# Patient Record
Sex: Female | Born: 1945
Health system: Southern US, Community
[De-identification: ages and names within clinical notes are randomized; demographics above are authoritative.]

## PROBLEM LIST (undated history)

## (undated) DIAGNOSIS — I071 Rheumatic tricuspid insufficiency: Secondary | ICD-10-CM

## (undated) DIAGNOSIS — J90 Pleural effusion, not elsewhere classified: Secondary | ICD-10-CM

## (undated) DIAGNOSIS — R739 Hyperglycemia, unspecified: Secondary | ICD-10-CM

## (undated) DIAGNOSIS — E039 Hypothyroidism, unspecified: Secondary | ICD-10-CM

## (undated) DIAGNOSIS — I2510 Atherosclerotic heart disease of native coronary artery without angina pectoris: Secondary | ICD-10-CM

## (undated) DIAGNOSIS — I10 Essential (primary) hypertension: Secondary | ICD-10-CM

## (undated) DIAGNOSIS — I428 Other cardiomyopathies: Secondary | ICD-10-CM

## (undated) DIAGNOSIS — I509 Heart failure, unspecified: Secondary | ICD-10-CM

## (undated) DIAGNOSIS — I350 Nonrheumatic aortic (valve) stenosis: Secondary | ICD-10-CM

## (undated) DIAGNOSIS — I340 Nonrheumatic mitral (valve) insufficiency: Secondary | ICD-10-CM

## (undated) DIAGNOSIS — I5022 Chronic systolic (congestive) heart failure: Secondary | ICD-10-CM

## (undated) DIAGNOSIS — N183 Chronic kidney disease, stage 3 unspecified: Secondary | ICD-10-CM

## (undated) DIAGNOSIS — E7800 Pure hypercholesterolemia, unspecified: Secondary | ICD-10-CM

## (undated) DIAGNOSIS — I4821 Permanent atrial fibrillation: Secondary | ICD-10-CM

## (undated) DIAGNOSIS — I499 Cardiac arrhythmia, unspecified: Secondary | ICD-10-CM

## (undated) DIAGNOSIS — M1990 Unspecified osteoarthritis, unspecified site: Secondary | ICD-10-CM

## (undated) DIAGNOSIS — I3139 Other pericardial effusion (noninflammatory): Secondary | ICD-10-CM

## (undated) DIAGNOSIS — I272 Pulmonary hypertension, unspecified: Secondary | ICD-10-CM

## (undated) DIAGNOSIS — E785 Hyperlipidemia, unspecified: Secondary | ICD-10-CM

## (undated) DIAGNOSIS — M8580 Other specified disorders of bone density and structure, unspecified site: Secondary | ICD-10-CM

## (undated) DIAGNOSIS — I4891 Unspecified atrial fibrillation: Secondary | ICD-10-CM

## (undated) DIAGNOSIS — K635 Polyp of colon: Secondary | ICD-10-CM

## (undated) DIAGNOSIS — E049 Nontoxic goiter, unspecified: Secondary | ICD-10-CM

## (undated) HISTORY — PX: US ECHOCARDIOGRAPHY: HXRAD669

## (undated) HISTORY — DX: Unspecified osteoarthritis, unspecified site: M19.90

## (undated) HISTORY — DX: Chronic systolic (congestive) heart failure: I50.22

## (undated) HISTORY — DX: Morbid (severe) obesity due to excess calories: E66.01

## (undated) HISTORY — DX: Hyperlipidemia, unspecified: E78.5

## (undated) HISTORY — DX: Permanent atrial fibrillation: I48.21

## (undated) HISTORY — DX: Hyperglycemia, unspecified: R73.9

## (undated) HISTORY — DX: Other specified disorders of bone density and structure, unspecified site: M85.80

## (undated) HISTORY — DX: Unspecified atrial fibrillation: I48.91

## (undated) HISTORY — PX: CARDIOVASCULAR STRESS TEST: SHX262

## (undated) HISTORY — DX: Nontoxic goiter, unspecified: E04.9

## (undated) HISTORY — DX: Hypothyroidism, unspecified: E03.9

## (undated) HISTORY — PX: COLONOSCOPY: SHX174

## (undated) HISTORY — DX: Polyp of colon: K63.5

## (undated) HISTORY — DX: Essential (primary) hypertension: I10

## (undated) HISTORY — PX: POLYPECTOMY: SHX149

## (undated) HISTORY — DX: Chronic kidney disease, stage 3 unspecified: N18.30

## (undated) HISTORY — DX: Heart failure, unspecified: I50.9

---

## 1975-12-03 HISTORY — PX: TUBAL LIGATION: SHX77

## 1998-09-06 ENCOUNTER — Other Ambulatory Visit: Admission: RE | Admit: 1998-09-06 | Discharge: 1998-09-06 | Payer: Self-pay | Admitting: Gynecology

## 1999-11-01 ENCOUNTER — Other Ambulatory Visit: Admission: RE | Admit: 1999-11-01 | Discharge: 1999-11-01 | Payer: Self-pay | Admitting: Gynecology

## 2000-04-29 ENCOUNTER — Encounter: Admission: RE | Admit: 2000-04-29 | Discharge: 2000-04-29 | Payer: Self-pay | Admitting: *Deleted

## 2000-12-16 ENCOUNTER — Observation Stay (HOSPITAL_COMMUNITY): Admission: EM | Admit: 2000-12-16 | Discharge: 2000-12-17 | Payer: Self-pay | Admitting: Emergency Medicine

## 2002-06-24 ENCOUNTER — Ambulatory Visit (HOSPITAL_COMMUNITY): Admission: RE | Admit: 2002-06-24 | Discharge: 2002-06-24 | Payer: Self-pay | Admitting: Gastroenterology

## 2003-01-20 ENCOUNTER — Encounter: Admission: RE | Admit: 2003-01-20 | Discharge: 2003-01-20 | Payer: Self-pay | Admitting: Internal Medicine

## 2003-01-20 ENCOUNTER — Encounter: Payer: Self-pay | Admitting: Internal Medicine

## 2009-10-05 ENCOUNTER — Encounter: Admission: RE | Admit: 2009-10-05 | Discharge: 2009-10-05 | Payer: Self-pay | Admitting: Orthopedic Surgery

## 2010-12-02 HISTORY — PX: BACK SURGERY: SHX140

## 2010-12-04 ENCOUNTER — Emergency Department (HOSPITAL_COMMUNITY)
Admission: EM | Admit: 2010-12-04 | Discharge: 2010-12-04 | Payer: Self-pay | Source: Home / Self Care | Admitting: Emergency Medicine

## 2011-01-11 ENCOUNTER — Other Ambulatory Visit: Payer: Self-pay | Admitting: Orthopedic Surgery

## 2011-01-11 ENCOUNTER — Other Ambulatory Visit (HOSPITAL_COMMUNITY): Payer: Self-pay | Admitting: Orthopedic Surgery

## 2011-01-11 ENCOUNTER — Ambulatory Visit (HOSPITAL_COMMUNITY)
Admission: RE | Admit: 2011-01-11 | Discharge: 2011-01-11 | Disposition: A | Discharge status: Home / Self Care | Payer: BC Managed Care – PPO | Source: Ambulatory Visit | Attending: Orthopedic Surgery | Admitting: Orthopedic Surgery

## 2011-01-11 ENCOUNTER — Encounter (HOSPITAL_COMMUNITY): Payer: BC Managed Care – PPO

## 2011-01-11 DIAGNOSIS — Z01818 Encounter for other preprocedural examination: Secondary | ICD-10-CM

## 2011-01-11 LAB — CBC
HCT: 44.3 % (ref 36.0–46.0)
Hemoglobin: 14.9 g/dL (ref 12.0–15.0)
MCH: 28.2 pg (ref 26.0–34.0)
MCHC: 33.6 g/dL (ref 30.0–36.0)
MCV: 83.7 fL (ref 78.0–100.0)
Platelets: 265 10*3/uL (ref 150–400)
RBC: 5.29 MIL/uL — ABNORMAL HIGH (ref 3.87–5.11)
RDW: 12.5 % (ref 11.5–15.5)
WBC: 7.2 10*3/uL (ref 4.0–10.5)

## 2011-01-11 LAB — BASIC METABOLIC PANEL
BUN: 18 mg/dL (ref 6–23)
CO2: 31 mEq/L (ref 19–32)
Calcium: 9.3 mg/dL (ref 8.4–10.5)
Chloride: 103 mEq/L (ref 96–112)
Creatinine, Ser: 1.11 mg/dL (ref 0.4–1.2)
GFR calc Af Amer: 60 mL/min — ABNORMAL LOW (ref >60)
GFR calc non Af Amer: 49 mL/min — ABNORMAL LOW (ref >60)
Glucose, Bld: 90 mg/dL (ref 70–99)
Potassium: 3.6 mEq/L (ref 3.5–5.1)
Sodium: 143 mEq/L (ref 135–145)

## 2011-01-11 LAB — SURGICAL PCR SCREEN
MRSA, PCR: NEGATIVE
Staphylococcus aureus: NEGATIVE

## 2011-01-15 ENCOUNTER — Other Ambulatory Visit (HOSPITAL_COMMUNITY): Payer: Self-pay | Admitting: Orthopedic Surgery

## 2011-01-15 ENCOUNTER — Inpatient Hospital Stay (HOSPITAL_COMMUNITY)
Admission: RE | Admit: 2011-01-15 | Discharge: 2011-01-19 | DRG: 756 | Disposition: A | Discharge status: Home Health | Payer: BC Managed Care – PPO | Attending: Orthopedic Surgery | Admitting: Orthopedic Surgery

## 2011-01-15 ENCOUNTER — Ambulatory Visit (HOSPITAL_COMMUNITY)
Admission: RE | Admit: 2011-01-15 | Discharge: 2011-01-15 | Disposition: A | Discharge status: Home / Self Care | Payer: BC Managed Care – PPO | Source: Ambulatory Visit | Attending: Orthopedic Surgery | Admitting: Orthopedic Surgery

## 2011-01-15 DIAGNOSIS — M549 Dorsalgia, unspecified: Secondary | ICD-10-CM

## 2011-01-15 DIAGNOSIS — M519 Unspecified thoracic, thoracolumbar and lumbosacral intervertebral disc disorder: Secondary | ICD-10-CM | POA: Diagnosis present

## 2011-01-15 DIAGNOSIS — E039 Hypothyroidism, unspecified: Secondary | ICD-10-CM | POA: Diagnosis present

## 2011-01-15 DIAGNOSIS — M4310 Spondylolisthesis, site unspecified: Principal | ICD-10-CM | POA: Diagnosis present

## 2011-01-15 DIAGNOSIS — E785 Hyperlipidemia, unspecified: Secondary | ICD-10-CM | POA: Diagnosis present

## 2011-01-15 DIAGNOSIS — I4891 Unspecified atrial fibrillation: Secondary | ICD-10-CM | POA: Diagnosis not present

## 2011-01-15 DIAGNOSIS — I10 Essential (primary) hypertension: Secondary | ICD-10-CM | POA: Diagnosis not present

## 2011-01-15 LAB — TYPE AND SCREEN
ABO/RH(D): A NEG
Antibody Screen: NEGATIVE

## 2011-01-16 LAB — ABO/RH: ABO/RH(D): A NEG

## 2011-01-16 NOTE — Op Note (Signed)
[name], [name]                ACCOUNT NO.:  [id number]  MEDICAL RECORD NO.:  [id number]           PATIENT TYPE:  O  LOCATION:  XRAY                         FACILITY:  [facility]  PHYSICIAN:  Marlowe Kays, M.D.  DATE OF BIRTH:  March 25, 1946  DATE OF PROCEDURE:  01/15/2011 DATE OF DISCHARGE:                              OPERATIVE REPORT   PREOPERATIVE DIAGNOSIS:  Severe spinal stenosis, L4-L5, secondary to degenerative spondylolisthesis with instability.  POSTOPERATIVE DIAGNOSIS:  Severe spinal stenosis, L4-L5, secondary to degenerative spondylolisthesis with instability.  OPERATIONS: 1. Central and lateral decompressive laminectomy, L4-L5. 2. Lateral fusion using autologous bone and Actifuse.  SURGEON:  Marlowe Kays, MD  ASSISTANT:  Georges Lynch. Gioffre, MD  ANESTHESIA:  General.  JUSTIFICATION FOR PROCEDURE:  She had symptoms of spinal stenosis, and on a myelogram performed on October 05, 2009, had severe spinal stenosis at L4-L5, particularly in the AP projection, as well as 6 mm of excursion of L4 on L5 on flexion/extension films.  Accordingly, I felt that the above-mentioned surgery was indicated.  PROCEDURE:  Prophylactic antibiotics, satisfactory general anesthesia, Foley catheter inserted, prone position on rolls.  Back was prepped with DuraPrep.  Time-out performed.  Using 3 spinal needles on lateral x-ray, we tentatively located L4-L5.  I then completed draping the back in sterile field, Ioban employed, midline incision.  Once down to the spinous processes, I tagged what we thought were L4 and L5 with Kocher clamps and a second lateral x-ray confirmed that this was the anatomic location desired.  We then dissected soft tissue off the neural arches from L3 to L5 and also worked laterally to L4-L5 over the transverse processes.  Then, first without and then with the microscope, I removed the spinous process of L4 and bone was saved for use later, and a portion of  the superior part of L5 and the inferior part of L3.  We then brought in the microscope and completed the decompression.  She was extremely tight at L4-L5 and at the L4-L5 junction, her dura was very thinned out.  At the conclusion of the decompression and after irrigating the wound, I then placed a Surgicel on top of the durum to try and give the thinned out dura a little more buttress and then we placed Gelfoam soaked in thrombin on top of this.  Then, working laterally over the transverse processes, we used the autologous bone supplementing with Actifuse at both locations.  I then removed the self- retaining retractors.  The wound looked reasonably dry.  The closure was performed with interrupted #1 Vicryl in the para lumbar muscle and fascia, leaving a small aperture distally for egress of any accumulated fluid.  Deep subcutaneous tissue was closed with #1 Vicryl again, 2-0 Vicryl in the superficial subcutaneous tissue, and staples in the skin.  Betadine Adaptic dry sterile dressings were applied.  She tolerated the procedure well and at the time of this dictation, she was on her way to the recovery room in satisfactory condition with no known complications. Blood loss was roughly 150 cc.  No blood replacement.  ______________________________ Marlowe Kays, M.D.     JA/MEDQ  D:  01/15/2011  T:  01/15/2011  Job:  [id number]  Electronically Signed by Marlowe Kays M.D. on 01/16/2011 11:11:06 AM

## 2011-01-17 DIAGNOSIS — I4891 Unspecified atrial fibrillation: Secondary | ICD-10-CM

## 2011-01-17 LAB — HEMOGLOBIN AND HEMATOCRIT, BLOOD
HCT: 35.1 % — ABNORMAL LOW (ref 36.0–46.0)
Hemoglobin: 11.6 g/dL — ABNORMAL LOW (ref 12.0–15.0)

## 2011-01-18 DIAGNOSIS — I4891 Unspecified atrial fibrillation: Secondary | ICD-10-CM

## 2011-01-18 LAB — TSH: TSH: 0.595 u[IU]/mL (ref 0.350–4.500)

## 2011-01-18 LAB — BASIC METABOLIC PANEL
BUN: 8 mg/dL (ref 6–23)
CO2: 30 mEq/L (ref 19–32)
Calcium: 8.7 mg/dL (ref 8.4–10.5)
Chloride: 100 mEq/L (ref 96–112)
Creatinine, Ser: 0.91 mg/dL (ref 0.4–1.2)
GFR calc Af Amer: >60 mL/min (ref >60)
GFR calc non Af Amer: >60 mL/min (ref >60)
Glucose, Bld: 100 mg/dL — ABNORMAL HIGH (ref 70–99)
Potassium: 3.5 mEq/L (ref 3.5–5.1)
Sodium: 136 mEq/L (ref 135–145)

## 2011-01-19 LAB — BASIC METABOLIC PANEL
BUN: 12 mg/dL (ref 6–23)
CO2: 30 mEq/L (ref 19–32)
Calcium: 8.7 mg/dL (ref 8.4–10.5)
Chloride: 100 mEq/L (ref 96–112)
Creatinine, Ser: 1.06 mg/dL (ref 0.4–1.2)
GFR calc Af Amer: >60 mL/min (ref >60)
GFR calc non Af Amer: 52 mL/min — ABNORMAL LOW (ref >60)
Glucose, Bld: 99 mg/dL (ref 70–99)
Potassium: 4.2 mEq/L (ref 3.5–5.1)
Sodium: 139 mEq/L (ref 135–145)

## 2011-01-30 ENCOUNTER — Inpatient Hospital Stay (HOSPITAL_COMMUNITY)
Admission: AD | Admit: 2011-01-30 | Discharge: 2011-02-06 | DRG: 418 | Disposition: A | Discharge status: Home / Self Care | Payer: BC Managed Care – PPO | Source: Ambulatory Visit | Attending: Orthopedic Surgery | Admitting: Orthopedic Surgery

## 2011-01-30 DIAGNOSIS — L02219 Cutaneous abscess of trunk, unspecified: Secondary | ICD-10-CM | POA: Diagnosis present

## 2011-01-30 DIAGNOSIS — Y838 Other surgical procedures as the cause of abnormal reaction of the patient, or of later complication, without mention of misadventure at the time of the procedure: Secondary | ICD-10-CM | POA: Diagnosis present

## 2011-01-30 DIAGNOSIS — L03319 Cellulitis of trunk, unspecified: Secondary | ICD-10-CM | POA: Diagnosis present

## 2011-01-30 DIAGNOSIS — T8140XA Infection following a procedure, unspecified, initial encounter: Principal | ICD-10-CM | POA: Diagnosis present

## 2011-01-30 LAB — COMPREHENSIVE METABOLIC PANEL
ALT: 16 U/L (ref 0–35)
AST: 21 U/L (ref 0–37)
Albumin: 3.3 g/dL — ABNORMAL LOW (ref 3.5–5.2)
Alkaline Phosphatase: 58 U/L (ref 39–117)
BUN: 15 mg/dL (ref 6–23)
CO2: 27 mEq/L (ref 19–32)
Calcium: 8.7 mg/dL (ref 8.4–10.5)
Chloride: 102 mEq/L (ref 96–112)
Creatinine, Ser: 1.24 mg/dL — ABNORMAL HIGH (ref 0.4–1.2)
GFR calc Af Amer: 53 mL/min — ABNORMAL LOW (ref >60)
GFR calc non Af Amer: 43 mL/min — ABNORMAL LOW (ref >60)
Glucose, Bld: 114 mg/dL — ABNORMAL HIGH (ref 70–99)
Potassium: 3.7 mEq/L (ref 3.5–5.1)
Sodium: 138 mEq/L (ref 135–145)
Total Bilirubin: 1 mg/dL (ref 0.3–1.2)
Total Protein: 6.3 g/dL (ref 6.0–8.3)

## 2011-01-30 LAB — CBC
HCT: 35.4 % — ABNORMAL LOW (ref 36.0–46.0)
Hemoglobin: 11.7 g/dL — ABNORMAL LOW (ref 12.0–15.0)
MCH: 28 pg (ref 26.0–34.0)
MCHC: 33.1 g/dL (ref 30.0–36.0)
MCV: 84.7 fL (ref 78.0–100.0)
Platelets: 226 10*3/uL (ref 150–400)
RBC: 4.18 MIL/uL (ref 3.87–5.11)
RDW: 12.8 % (ref 11.5–15.5)
WBC: 17.6 10*3/uL — ABNORMAL HIGH (ref 4.0–10.5)

## 2011-01-30 LAB — SEDIMENTATION RATE: Sed Rate: 35 mm/hr — ABNORMAL HIGH (ref 0–22)

## 2011-01-30 NOTE — Consult Note (Signed)
[name], [name]                ACCOUNT NO.:  [id number]  MEDICAL RECORD NO.:  [id number]           PATIENT TYPE:  I  LOCATION:  1423                         FACILITY:  [facility]  PHYSICIAN:  Reagan Behlke C. Janiel Derhammer, MD, FACCDATE OF BIRTH:  05-07-1946  DATE OF CONSULTATION: DATE OF DISCHARGE:                                CONSULTATION   I was asked by Dr. [name] to evaluate [name] [name] with new-onset atrial fibrillation with a rapid ventricular rate.  HISTORY OF PRESENT ILLNESS:  [name] [name] is a delightful 65 year old married white female from [place], [place], who was admitted on February 14 for severe spinal stenosis at L4-L5 for central and lateral decompressive laminectomy.  She takes atenolol 100 mg a day and Norvasc for blood pressure.  She has not taken the atenolol for 2 days.  Today at around 2, she was noted to have a heart rate in the 140 range. EKG demonstrated atrial fibrillation with a rapid ventricular rate with nonspecific ST-segment changes.  She denies any chest pain, shortness of breath or palpitations.  She has no cardiac history.  She does have a history of hypertension and actually was admitted several years ago for hypertensive crisis.  She has been fairly well controlled on Norvasc and atenolol prior to admission.  PAST MEDICAL HISTORY:  She has a history of hypothyroidism on replacement therapy.  She has a history hyperlipidemia on pravastatin. She has a history of obesity.  ALLERGIES:  No known drug allergies.  CURRENT MEDICATIONS: 1. Levothyroxine 125 mcg a day. 2. Pravastatin 80 mg a day. 3. Furosemide 40 mg a day. 4. Amlodipine 5 mg a day. 5. Atenolol 100 mg daily.  SOCIAL HISTORY:  She is married, her husband is in the room.  They live in [place].  FAMILY HISTORY:  Remarkable for hypertension.  There is no history of diabetes.  REVIEW OF SYSTEMS:  Other than HPI is negative.  She has had no history of orthopnea, PND,  pleuritic chest pain, hemoptysis, history of bleeding disorder or bleeding, and no lower extremity edema.  PHYSICAL EXAMINATION:  GENERAL:  She is lying in bed on her side in no acute distress. SKIN:  Warm and dry. VITAL SIGNS:  Her blood pressure is currently 117/70, her pulse is 140 and irregular, temperature is 98, respirations are 18 and unlabored. She has got a sat of 90% on room air. HEENT:  Unremarkable.  Dentition is normal.  No false teeth or plates. NECK:  Supple.  Carotid upstrokes are equal bilaterally without bruits. No JVD.  Thyroid is not enlarged.  Trachea is midline. LUNGS:  Clear. HEART:  Reveals a poorly appreciated PMI.  She has irregular rate and rhythm with a variable S1, S2.  No obvious murmur, rub or gallop. ABDOMEN:  Soft.  Good bowel sounds.  She is obese. EXTREMITIES:  Reveal no sign of DVT.  She has no edema.  Pulses are intact.  Pneumatic cuffs are in place. NEUROLOGIC:  Grossly intact.  LABORATORY DATA:  Shows a potassium on February 10 of 3.6.  Hemoglobin today is 11.6 and relatively stable.  ASSESSMENT: 1. Paroxysmal  atrial fibrillation with a rapid ventricular rate,     currently asymptomatic, secondary to a history of hypertension and     also postsurgical and stress. 2. Hypertension. 3. Obesity. 4. No significant risk factors for thromboembolic stroke other than     hypertension.  PLAN: 1. Intravenous Lopressor 5 mg q. 5 minutes x3 dosages. 2. Give home atenolol of 100 mg p.o. daily now. 3. Transfer to telemetry. 4. 2-D echocardiogram when rate is controlled or her she is back in     sinus rhythm. 5. Check TSH. 6. Check BMET.  Try to keep potassium above 4. 7. Enteric-coated aspirin 325 mg a day.  Thanks for the consultation.     Harshita Bernales C. Daleen Squibb, MD, [facility]     TCW/MEDQ  D:  01/17/2011  T:  01/18/2011  Job:  [id number]  Electronically Signed by Valera Castle MD [facility] on 01/30/2011 08:29:14 AM

## 2011-01-31 LAB — URINALYSIS, ROUTINE W REFLEX MICROSCOPIC
Bilirubin Urine: NEGATIVE
Hgb urine dipstick: NEGATIVE
Ketones, ur: NEGATIVE mg/dL
Nitrite: NEGATIVE
Protein, ur: NEGATIVE mg/dL
Specific Gravity, Urine: 1.013 (ref 1.005–1.030)
Urine Glucose, Fasting: NEGATIVE mg/dL
Urobilinogen, UA: 0.2 mg/dL (ref 0.0–1.0)
pH: 7 (ref 5.0–8.0)

## 2011-01-31 LAB — URINE MICROSCOPIC-ADD ON

## 2011-01-31 LAB — C-REACTIVE PROTEIN: CRP: 20.7 mg/dL — ABNORMAL HIGH (ref <0.6)

## 2011-01-31 NOTE — H&P (Signed)
  [name], [name]                ACCOUNT NO.:  [id number]  MEDICAL RECORD NO.:  [id number]           PATIENT TYPE:  I  LOCATION:  1528                         FACILITY:  [facility]  PHYSICIAN:  Marlowe Kays, M.D.  DATE OF BIRTH:  1946-09-06  DATE OF ADMISSION:  01/30/2011 DATE OF DISCHARGE:                             HISTORY & PHYSICAL   I performed a decompressive laminectomy on her on January 15, 2011. She was discharged several days later doing well and has continued to do well from a pain standpoint since.  On January 25, 2011, 10 days after surgery, I saw her in the office.  She did have a small amount of drainage in her back and we thought maybe there is little reaction to staples.  We cultured this area and we kept her wound dressed.  I saw her again on January 28, 2011, or 2 days ago and at that time we removed the remainder of her staples.  There was little more drainage when we removed the staples but she has had minimal drainage since according to her husband.  Yesterday evening, however, she developed some chills and was seen in our office today by one of my partners Dr. Jillyn Hidden who noted significant redness about her back and was concerned enough to call me and we are admitting her tonight for IV antibiotics and possible I and D of the back wound.  She indicates that she has had chills and perhaps a fever although her temperature is 99 degrees tonight.  PRESENT MEDICATIONS:  Unchanged since leaving the hospital.  She is taking nothing for pain.  She is taking levothyroxine 125 mcg p.o. 1 every morning, pravastatin 80 mg 1 tablet every morning, furosemide 40 mg 1 tablet every morning, aspirin 81 mg daily, amlodipine 5 mg p.o. daily, and atenolol 1 mg p.o. daily.  ALLERGIES:  She has no drug allergies.  PHYSICAL EXAMINATION:  She is clearly not feeling well.  She complains of some chills.  As noted, her temperature is only 99, however, she has significant redness  over entire lower back, particularly on the left side.  There is no drainage from her wound this evening.  I spent a good bit of time discussing things with her and her husband. She may need to have an I and D of her back but since has no drainage at the present time, I would like to see how she makes out with the antibiotics and hold on any surgery which she would prefer not to have unless absolutely necessary.  We will follow her on daily basis with a plan to take her to the operating room if this appears to be the appropriate thing to do.  In the meantime, I am going to obtain blood cultures and follow her on a regular basis.          ______________________________ Marlowe Kays, M.D.     JA/MEDQ  D:  01/30/2011  T:  01/31/2011  Job:  [id number]  Electronically Signed by Marlowe Kays M.D. on 01/31/2011 11:08:58 AM

## 2011-02-01 LAB — CREATININE, SERUM
Creatinine, Ser: 1.03 mg/dL (ref 0.4–1.2)
GFR calc Af Amer: >60 mL/min (ref >60)
GFR calc non Af Amer: 54 mL/min — ABNORMAL LOW (ref >60)

## 2011-02-01 LAB — VANCOMYCIN, TROUGH: Vancomycin Tr: 23.9 ug/mL — ABNORMAL HIGH (ref 10.0–20.0)

## 2011-02-02 LAB — WOUND CULTURE: Gram Stain: NONE SEEN

## 2011-02-02 LAB — URINE CULTURE
Colony Count: NO GROWTH
Culture  Setup Time: 201203020537
Culture: NO GROWTH
Special Requests: POSITIVE

## 2011-02-04 LAB — CREATININE, SERUM
Creatinine, Ser: 0.82 mg/dL (ref 0.4–1.2)
GFR calc Af Amer: >60 mL/min (ref >60)
GFR calc non Af Amer: >60 mL/min (ref >60)

## 2011-02-04 LAB — VANCOMYCIN, TROUGH: Vancomycin Tr: 21.8 ug/mL — ABNORMAL HIGH (ref 10.0–20.0)

## 2011-02-06 LAB — CULTURE, BLOOD (ROUTINE X 2)
Culture  Setup Time: 201203010300
Culture  Setup Time: 201203010300
Culture: NO GROWTH
Culture: NO GROWTH

## 2011-02-06 LAB — CREATININE, SERUM
Creatinine, Ser: 0.81 mg/dL (ref 0.4–1.2)
GFR calc Af Amer: >60 mL/min (ref >60)
GFR calc non Af Amer: >60 mL/min (ref >60)

## 2011-02-11 LAB — URINE CULTURE
Colony Count: NO GROWTH
Culture  Setup Time: 201201031113
Culture: NO GROWTH

## 2011-02-11 LAB — COMPREHENSIVE METABOLIC PANEL
ALT: 17 U/L (ref 0–35)
AST: 25 U/L (ref 0–37)
Albumin: 3.7 g/dL (ref 3.5–5.2)
Alkaline Phosphatase: 66 U/L (ref 39–117)
BUN: 16 mg/dL (ref 6–23)
CO2: 28 mEq/L (ref 19–32)
Calcium: 8.7 mg/dL (ref 8.4–10.5)
Chloride: 105 mEq/L (ref 96–112)
Creatinine, Ser: 1.09 mg/dL (ref 0.4–1.2)
GFR calc Af Amer: >60 mL/min (ref >60)
GFR calc non Af Amer: 50 mL/min — ABNORMAL LOW (ref >60)
Glucose, Bld: 112 mg/dL — ABNORMAL HIGH (ref 70–99)
Potassium: 3.6 mEq/L (ref 3.5–5.1)
Sodium: 139 mEq/L (ref 135–145)
Total Bilirubin: 0.4 mg/dL (ref 0.3–1.2)
Total Protein: 6.4 g/dL (ref 6.0–8.3)

## 2011-02-11 LAB — CBC
HCT: 41.9 % (ref 36.0–46.0)
Hemoglobin: 14.2 g/dL (ref 12.0–15.0)
MCH: 28.9 pg (ref 26.0–34.0)
MCHC: 33.9 g/dL (ref 30.0–36.0)
MCV: 85.2 fL (ref 78.0–100.0)
Platelets: 238 10*3/uL (ref 150–400)
RBC: 4.92 MIL/uL (ref 3.87–5.11)
RDW: 12.9 % (ref 11.5–15.5)
WBC: 7.7 10*3/uL (ref 4.0–10.5)

## 2011-02-11 LAB — DIFFERENTIAL
Basophils Absolute: 0.1 10*3/uL (ref 0.0–0.1)
Basophils Relative: 1 % (ref 0–1)
Eosinophils Absolute: 0.2 10*3/uL (ref 0.0–0.7)
Eosinophils Relative: 3 % (ref 0–5)
Lymphocytes Relative: 23 % (ref 12–46)
Lymphs Abs: 1.8 10*3/uL (ref 0.7–4.0)
Monocytes Absolute: 0.7 10*3/uL (ref 0.1–1.0)
Monocytes Relative: 10 % (ref 3–12)
Neutro Abs: 5 10*3/uL (ref 1.7–7.7)
Neutrophils Relative %: 64 % (ref 43–77)

## 2011-02-11 LAB — URINALYSIS, ROUTINE W REFLEX MICROSCOPIC
Bilirubin Urine: NEGATIVE
Glucose, UA: NEGATIVE mg/dL
Hgb urine dipstick: NEGATIVE
Ketones, ur: NEGATIVE mg/dL
Nitrite: NEGATIVE
Protein, ur: NEGATIVE mg/dL
Specific Gravity, Urine: 1.009 (ref 1.005–1.030)
Urobilinogen, UA: 0.2 mg/dL (ref 0.0–1.0)
pH: 6.5 (ref 5.0–8.0)

## 2011-02-11 LAB — URINE MICROSCOPIC-ADD ON

## 2011-02-11 LAB — LIPASE, BLOOD: Lipase: 55 U/L (ref 11–59)

## 2011-02-18 NOTE — Discharge Summary (Signed)
[name], GALLINA                ACCOUNT NO.:  [id number]  MEDICAL RECORD NO.:  [id number]           PATIENT TYPE:  I  LOCATION:  1423                         FACILITY:  [facility]  PHYSICIAN:  Marlowe Kays, M.D.  DATE OF BIRTH:  13-May-1946  DATE OF ADMISSION:  01/15/2011 DATE OF DISCHARGE:  01/19/2011                              DISCHARGE SUMMARY   ADMITTING DIAGNOSIS:  Severe spinal stenosis L4-L5 secondary to degenerative spondylolisthesis with instability.  DISCHARGE DIAGNOSES: 1. Severe spinal stenosis L4-L5 secondary to degenerative     spondylolisthesis with instability. 2. Onset of atrial fibrillation with rapid ventricular rate. 3. Hypertension. 4. Obesity.  OPERATIONS:  On January 15, 2011, the patient underwent: 1. Central and lateral decompressive lumbar laminectomy at L4-L5. 2. Lateral fusion utilizing autologous bone and Actifuse, Dr. Ranee Gosselin assisted.  BRIEF HISTORY:  This lady has been having spinal stenosis for quite a while.  She has pain in the lumbar spine as well as radiating into both legs.  Myelogram performed on November 4 showed spinal stenosis at L4-L5 with 6 mm excursion of L4-L5 on flexion and extension films.  After much consideration including the risks and benefits of surgery, it was decided she would benefit with surgical intervention and was admitted for the above procedure.  COURSE IN THE HOSPITAL:  The patient tolerated the surgical procedure quite well, received a physical therapy evaluation which helped her become more active postoperatively.  On January 17, 2011, the patient had noted irregular heart rate as well as the nursing staff.  The on- call staff was notified and we got a cardiology consult on January 17, 2011.  Paroxysmal atrial fibrillation with rapid ventricular response was noted by Dr. Valera Castle.  He treated with intravenous Lopressor, atenolol.  He also followed her closely throughout her  hospitalization to keep potassium above 4.  She had no further symptomatic episodes. The cardiac team continued with the atenolol.  An echo was done.  On January 19, 2011, they felt that she was stable and that she could be discharged home to follow up with her cardiologist.  When seen, she was stable as far as her cardiac condition was concerned.  She was awake and alert.  Wound was dry.  Neurovascular was intact in lower extremities and she had much less buttock pain.  Laboratory values in the hospital, the echo showed no pericardial effusion.  Normal muscle wall thickness, some trivial regurgitation at the mitral valve, and no stenosis or regurg was noted in the aortic valve.  Ejection fraction was 60% to 65%.  Laboratory values in the hospital showed a hemoglobin of 11.6, hematocrit was 35.5.  Potassium did remain above 4 after treatment and she was discharged with her potassium at 4.2 and sodium was 139.  The patient is to continue with her levothyroxine, pravastatin, furosemide, amlodipine, and atenolol.  She is use dry dressing p.r.n. to her back.  Return to see [name] in about 2 weeks' after surgery.  She will have home health for stability and safety at home with [facility].     [name] Idolina Primer,  P.A.   ______________________________ Marlowe Kays, M.D.    DLU/MEDQ  D:  02/13/2011  T:  02/13/2011  Job:  [id number]  cc:   [facility]  Electronically Signed by Marlowe Kays M.D. on 02/18/2011 11:21:16 AM

## 2011-03-05 NOTE — Discharge Summary (Signed)
  [name], [name]                ACCOUNT NO.:  [id number]  MEDICAL RECORD NO.:  [id number]           PATIENT TYPE:  I  LOCATION:  1528                         FACILITY:  [facility]  PHYSICIAN:  Marlowe Kays, M.D.  DATE OF BIRTH:  04/14/1946  DATE OF ADMISSION:  01/30/2011 DATE OF DISCHARGE:  02/06/2011                              DISCHARGE SUMMARY   ADMITTING DIAGNOSIS:  Possible infection lumbar laminectomy incision to the back.  DISCHARGE DIAGNOSIS:  Possible infection lumbar laminectomy incision to the back.  OPERATIONS:  None.  BRIEF HISTORY:  This 65 year-old female was seen by [name] following surgery of the lumbar spine which was done February 14.  She did well postoperatively but on or about February 24, she noticed some drainage in the back.  We cultured this area, kept the wound dressed.  She was seen again in the office on February 27 and no more drainage was removed after all staples were removed.  She was seen on the day of discharge by Dr. [name] of [facility], one of our partners, and redness was concerned and it was decided to go ahead and admit her for IV antibiotic therapy.  COURSE IN THE HOSPITAL:  The patient was started on IV antibiotic and it was noted that her temperature had indeed gone up to 102 degrees.  She was given vancomycin IV.  She did well with those medications.  We did not have to do an I and D as she got better with this treatment.  She also was given Rocephin.  She had improved considerably when Dr. [name] saw her on the day of discharge.  He changed the dressing. She was normotensive at the time of discharge and stable.  He discharged her to home to see her back in the office in a couple of weeks.  LABORATORY VALUES:  Laboratory values in the hospital showed white count elevated on admission 17.6, otherwise within normal limits.  Chemistries were normal as well.  C-reactive protein was high at 20.7.  Urinalysis was essentially  negative.  There were few small amount of leukocyte esterase.  Blood cultures showed no growth within 5 days.  Urine culture showed no growth as well.  The culture from her back grew out moderate methicillin-resistant staphylococcus which was sensitive to vancomycin.  DISPOSITION:  She was discharged home to continue with her medications.  DISCHARGE MEDICATIONS: 1. Levothyroxine 125 mcg daily. 2. Pravastatin 80 mg daily. 3. Furosemide 40 mg daily. 4. Aspirin 81 mg daily. 5. Amlodipine 5 mg daily. 6. Atenolol 100 mg daily.  DISCHARGE INSTRUCTIONS:  She is to call should she have any marked problems concerning her back.     [name] [name].   ______________________________ Marlowe Kays, M.D.    DLU/MEDQ  D:  03/01/2011  T:  03/01/2011  Job:  [id number]  Electronically Signed by Marlowe Kays M.D. on 03/05/2011 03:39:29 PM

## 2011-04-19 NOTE — H&P (Signed)
[place]. [facility]  Patient:    [name], [name]                         MRN: [id number] Adm. Date:  12/16/00 Attending:  Fritzi Mandes, M.D.                         History and Physical  DIAGNOSES: 1. Hypertensive crisis. 2. Neurologic symptoms. 3. Hypothyroidism. 4. Hyperlipidemia. 5. Urticaria.  HISTORY OF PRESENT ILLNESS:  [name] is a lady with hypertension who recently had her blood pressure medications changed because of urticaria which was persistent. Basically her Lo-Trol was discontinued and her Lasix dose was increased.  On this new regimen, her blood pressures recently have been fairly normal.  However, today, she arose with a funny feeling on the left side of her head, a feeling of the world spinning, and her left jaw briefly felt abnormal.  She also complained of dry mouth, being red on the right side of her face, and some diaphoresis.  Her hives have actually improved in the recent past.  I saw her in the office where her blood pressure was 220/200 and on repeat 220/130.  We repeated orthostatics and her blood pressure is 186/124, and on repeat 184/120.  PAST MEDICAL HISTORY:  Significant for the problems as outlined above.  SOCIAL HISTORY:  She does not smoke or drink.  FAMILY HISTORY:  As outlined in my office note.  REVIEW OF SYSTEMS:  Significant for the unusual headache as above.  She denied chest pain, palpitations.  She denied shortness of breath.  She has had some recent urinary frequency.  Skin was notable for redness on the right side of her face.  She felt hot.  She feels quite fatigued.  PHYSICAL EXAMINATION:  VITAL SIGNS:  Blood pressure were as outlined above. Weight was 233 pounds representing a 5 pound weight gain. She had no lid lag.  Thyroid is normal.  LUNGS:  clear to auscultation.  HEART:  Regular rate and rhythm without murmur, rub or gallops.  ABDOMEN:  Bowel sounds positive, soft, nontender.  Pulses  were normal.  There was no edema.  NEUROLOGIC:  Detailed neurologic examination included some redness of the right side of her face.  Cranial nerves were intact.  Speech was normal.  Gait was somewhat unsteady and she had difficulty with toe and heel walking. Sensation was intact.  Romberg was negative, pronator drift was negative, tremor was negative, cerebellar exam was normal.  Mentation was normal.  She was oriented, alert, had normal speech.  LABORATORY DATA:  The patient had a CBC, CMET, urinalysis and all of these were normal.  ASSESSMENT AND PLAN:  The patient appears to be having an extremely high blood pressure out of the blue with most of her recent home blood pressures being fairly normal.  I am not sure of the causes.  However, given her neurologic symptoms, and concerns enough to admit her, at least for observation to a step-down unit.  I would rather for her to get Norvasc 10 mg p.o. q.d. now and clonidine 0.1 mg p.o. t.i.d. now, however, these have not been given to her and her blood pressure is somewhat improved at the time of this dictation. Will add the Norvasc 10 mg p.o. q. day and hold on the clonidine for now.  I have ordered an MRI of the head, this is still pending.  Will have her at bed rest for the moment given her dizziness and gait unsteadiness.  Tomorrow, if her blood pressure is in better shape, she is steady on her feet, her neurological symptoms are improved and a head MRI is okay, would think she could be discharged to home. DD:  12/16/00 TD:  12/16/00 Job: [id number]

## 2011-04-19 NOTE — H&P (Signed)
[place]. [facility]  Patient:    [name], [name]                         MRN: [id number] Adm. Date:  12/16/00 Attending:  Fritzi Mandes, M.D.                         History and Physical  DIAGNOSES: 1. Hypertensive crisis. 2. Hypothyroidism. 3. Hyperlipidemia. 4. Urticaria.  HISTORY OF PRESENT ILLNESS:  [name] is a 65 year old white female with a history of hypertension. DD:  12/16/00 TD:  12/16/00 Job: [id number]

## 2011-04-19 NOTE — Discharge Summary (Signed)
[place]. [facility]  Patient:    [name], [name]                         MRN: [id number] Adm. Date:  [id number] Disc. Date: [id number] Attending:  Fritzi Mandes                           Discharge Summary  FINAL DIAGNOSES: 1. Hypertensive crisis. 2. Neurologic symptoms. 3. Hypothyroidism. 4. Hyperlipidemia. 5. Urticaria.  HISTORY OF PRESENT ILLNESS:  For details of the history of present illness, please see my dictation dated December 16, 2000.  In brief, Ms. Poteet was admitted with a blood pressure of 220/130 with a funny feeling in the left side of her head, a feeling of the world spinning, and her left jaw briefly feeling abnormal.  HOSPITAL COURSE:  The patient was admitted to the intensive care unit and given bed rest, clonidine 0.1 mg p.o. t.i.d., and Norvasc 10 mg p.o. q.d. and we continued her Lasix 20 mg p.o. q.d. and atenolol 100 mg p.o. q.d.  The following day, she had had a good night.  She had a slight headache, but no focal neurologic problems.  The blood pressure was in the 130-154/68 range. She was eating well and feeling well.  The decision was made by Jeannett Senior A. [place] Martin, M.D., to discharge her to home.  LABORATORY DATA:  Evaluations in the hospital included an EKG on December 16, 2000, which showed normal sinus rhythm and normal EKG.  The chest x-ray on December 16, 2000, showed upper limits of normal heart size without evidence of acute cardiopulmonary change.  An MRI of the brain showed no intracranial finding.  There were very minimal changes of small vessel disease within the white matter of the cerebral hemispheres.  The was a non-acute finding.  Laboratories included a hemoglobin of 14.5 and hematocrit 42.2.  Chemistries were all normal, including a creatinine of 1.2.  The urinalysis was normal.  CONDITION ON DISCHARGE:  Improved.  DISCHARGE MEDICATIONS: 1. Atenolol 100 mg p.o. q.d. 2. Lasix 40 mg p.o. q.d. 3. Norvasc 10 mg  p.o. q.d. 4. Levoxyl as before.  ACTIVITY:  No restrictions.  DIET:  As before.  FOLLOW-UP:  To be with me in the office. DD:  01/07/01 TD:  01/08/01 Job: [id number]

## 2012-08-17 ENCOUNTER — Telehealth: Payer: Self-pay | Admitting: *Deleted

## 2012-08-17 ENCOUNTER — Ambulatory Visit (AMBULATORY_SURGERY_CENTER): Payer: Medicare Other | Admitting: *Deleted

## 2012-08-17 VITALS — Ht 66.0 in | Wt 235.4 lb

## 2012-08-17 DIAGNOSIS — Z1211 Encounter for screening for malignant neoplasm of colon: Secondary | ICD-10-CM

## 2012-08-17 MED ORDER — MOVIPREP 100 G PO SOLR: ORAL | Status: DC

## 2012-08-17 NOTE — Progress Notes (Signed)
Pt scheduled for colonoscopy with Dr. Marina Goodell 08/31/2012.  Previous colonoscopy with Dr. Sharrell Ku.  Pt is not sure when she had last colonoscopy and is not sure if she had polyps.  Release of information form signed and given to Alonna Buckler, CMA.

## 2012-08-17 NOTE — Telephone Encounter (Signed)
Pt scheduled for colonoscopy with Dr. Perry 08/31/2012.  Previous colonoscopy with Dr. Jeffrey Medoff.  Pt is not sure when she had last colonoscopy and is not sure if she had polyps.  Release of information form signed and given to Leslie Wrenn, CMA. 

## 2012-08-18 ENCOUNTER — Encounter: Payer: Self-pay | Admitting: Internal Medicine

## 2012-08-26 ENCOUNTER — Telehealth: Payer: Self-pay | Admitting: Internal Medicine

## 2012-08-27 NOTE — Telephone Encounter (Signed)
Reviewed previous colon report with pts. Last colon was done in July of 2003. No polyps were found on the previous colon. Previous report listed a 10 year recall. Pt instructed to prep and told and to keep colon appt as scheduled. Pt verbalized understanding.

## 2012-08-31 ENCOUNTER — Ambulatory Visit (AMBULATORY_SURGERY_CENTER): Payer: Medicare Other | Admitting: Internal Medicine

## 2012-08-31 ENCOUNTER — Encounter: Payer: Self-pay | Admitting: Internal Medicine

## 2012-08-31 VITALS — BP 148/73 | HR 67 | Temp 98.7°F | Resp 18 | Ht 65.0 in | Wt 235.0 lb

## 2012-08-31 DIAGNOSIS — D126 Benign neoplasm of colon, unspecified: Secondary | ICD-10-CM

## 2012-08-31 DIAGNOSIS — Z1211 Encounter for screening for malignant neoplasm of colon: Secondary | ICD-10-CM

## 2012-08-31 MED ORDER — SODIUM CHLORIDE 0.9 % IV SOLN: 500.0000 mL | INTRAVENOUS | Status: DC

## 2012-08-31 NOTE — Op Note (Signed)
[facility] [address] [place], [address]   COLONOSCOPY PROCEDURE REPORT  PATIENT: [name], [name]  MR#: [id number] BIRTHDATE: 30-May-1946 , 66  yrs. old GENDER: Female ENDOSCOPIST: Roxy Cedar, MD REFERRED AO:ZHYQ Timothy Lasso, M.D. PROCEDURE DATE:  08/31/2012 PROCEDURE:   Colonoscopy with snare polypectomy    x 3 ASA CLASS:   Class II INDICATIONS:average risk screening.   Index exam 06-2002 (Dr Kinnie Scales) was normal MEDICATIONS: MAC sedation, administered by CRNA and propofol (Diprivan) 240mg  IV  DESCRIPTION OF PROCEDURE:   After the risks benefits and alternatives of the procedure were thoroughly explained, informed consent was obtained.  A digital rectal exam revealed no abnormalities of the rectum.   The LB [id number]  endoscope was introduced through the anus and advanced to the cecum, which was identified by both the appendix and ileocecal valve. No adverse events experienced.   The quality of the prep was excellent, using MoviPrep  The instrument was then slowly withdrawn as the colon was fully examined.      COLON FINDINGS: Three diminutive polyps were found in the ascending colon (2) and descending colon.  A polypectomy was performed with a cold snare.  The resection was complete and the polyp tissue was completely retrieved.   Mild diverticulosis was noted in the ascending colon, descending colon, and sigmoid colon.  Retroflexed views revealed no abnormalities. The time to cecum=1 minutes 44 seconds.  Withdrawal time=11 minutes 56 seconds.  The scope was withdrawn and the procedure completed. COMPLICATIONS: There were no complications.  ENDOSCOPIC IMPRESSION: 1.   Three diminutive polyps were found in the ascending colon and descending colon; polypectomy was performed with a cold snare 2.   Mild diverticulosis was noted in the ascending colon, descending colon, and sigmoid colon  RECOMMENDATIONS: 1. Repeat colonoscopy in 5 years if polyp  adenomatous; otherwise 10 years   eSigned:  Roxy Cedar, MD 08/31/2012 10:00 AM   cc: Creola Corn, MD and The Patient   PATIENT NAME:  [name], [name] MR#: [id number]

## 2012-08-31 NOTE — Patient Instructions (Addendum)
Findings:  Polyps, Diverticulosis Recommendations:  Wait for path to determine when to repeat colonoscopy 5-10 yrs.  YOU HAD AN ENDOSCOPIC PROCEDURE TODAY AT THE [facility]: Refer to the procedure report that was given to you for any specific questions about what was found during the examination.  If the procedure report does not answer your questions, please call your gastroenterologist to clarify.  If you requested that your care partner not be given the details of your procedure findings, then the procedure report has been included in a sealed envelope for you to review at your convenience later.  YOU SHOULD EXPECT: Some feelings of bloating in the abdomen. Passage of more gas than usual.  Walking can help get rid of the air that was put into your GI tract during the procedure and reduce the bloating. If you had a lower endoscopy (such as a colonoscopy or flexible sigmoidoscopy) you may notice spotting of blood in your stool or on the toilet paper. If you underwent a bowel prep for your procedure, then you may not have a normal bowel movement for a few days.  DIET: Your first meal following the procedure should be a light meal and then it is ok to progress to your normal diet.  A half-sandwich or bowl of soup is an example of a good first meal.  Heavy or fried foods are harder to digest and may make you feel nauseous or bloated.  Likewise meals heavy in dairy and vegetables can cause extra gas to form and this can also increase the bloating.  Drink plenty of fluids but you should avoid alcoholic beverages for 24 hours.  ACTIVITY: Your care partner should take you home directly after the procedure.  You should plan to take it easy, moving slowly for the rest of the day.  You can resume normal activity the day after the procedure however you should NOT DRIVE or use heavy machinery for 24 hours (because of the sedation medicines used during the test).    SYMPTOMS TO REPORT IMMEDIATELY: A  gastroenterologist can be reached at any hour.  During normal business hours, 8:30 AM to 5:00 PM Monday through Friday, call [phone].  After hours and on weekends, please call the GI answering service at [phone] who will take a message and have the physician on call contact you.   Following lower endoscopy (colonoscopy or flexible sigmoidoscopy):  Excessive amounts of blood in the stool  Significant tenderness or worsening of abdominal pains  Swelling of the abdomen that is new, acute  Fever of 100F or higher  Following upper endoscopy (EGD)  Vomiting of blood or coffee ground material  New chest pain or pain under the shoulder blades  Painful or persistently difficult swallowing  New shortness of breath  Fever of 100F or higher  Black, tarry-looking stools  FOLLOW UP: If any biopsies were taken you will be contacted by phone or by letter within the next 1-3 weeks.  Call your gastroenterologist if you have not heard about the biopsies in 3 weeks.  Our staff will call the home number listed on your records the next business day following your procedure to check on you and address any questions or concerns that you may have at that time regarding the information given to you following your procedure. This is a courtesy call and so if there is no answer at the home number and we have not heard from you through the emergency physician on call, we will  assume that you have returned to your regular daily activities without incident.  SIGNATURES/CONFIDENTIALITY: You and/or your care partner have signed paperwork which will be entered into your electronic medical record.  These signatures attest to the fact that that the information above on your After Visit Summary has been reviewed and is understood.  Full responsibility of the confidentiality of this discharge information lies with you and/or your care-partner.   Please follow all discharge instructions given to you by the recovery  room nurse. If you have any questions or problems after discharge please call one of the numbers listed above. You will receive a phone call in the am to see how you are doing and answer any questions you may have. Thank you for choosing [facility] for your health care needs.

## 2012-08-31 NOTE — Progress Notes (Signed)
Patient did not experience any of the following events: a burn prior to discharge; a fall within the facility; wrong site/side/patient/procedure/implant event; or a hospital transfer or hospital admission upon discharge from the facility. (G8907) Patient did not have preoperative order for IV antibiotic SSI prophylaxis. (G8918)  

## 2012-08-31 NOTE — Progress Notes (Addendum)
Propofol per B Walton CRNA, see scanned intra procedure report.  All meds titrated per CRNA during procedure. ewm 

## 2012-09-01 ENCOUNTER — Telehealth: Payer: Self-pay | Admitting: *Deleted

## 2012-09-01 NOTE — Telephone Encounter (Signed)
  Follow up Call-  Call back number 08/31/2012  Post procedure Call Back phone  # [phone]  Permission to leave phone message Yes     Patient questions:  Do you have a fever, pain , or abdominal swelling? no Pain Score  0 *  Have you tolerated food without any problems? yes  Have you been able to return to your normal activities? yes  Do you have any questions about your discharge instructions: Diet   no Medications  no Follow up visit  no  Do you have questions or concerns about your Care? no  Actions: * If pain score is 4 or above: No action needed, pain <4.

## 2012-09-04 ENCOUNTER — Encounter: Payer: Self-pay | Admitting: Internal Medicine

## 2016-02-05 DIAGNOSIS — I10 Essential (primary) hypertension: Secondary | ICD-10-CM | POA: Diagnosis not present

## 2016-02-05 DIAGNOSIS — E039 Hypothyroidism, unspecified: Secondary | ICD-10-CM | POA: Diagnosis not present

## 2016-02-05 DIAGNOSIS — N183 Chronic kidney disease, stage 3 (moderate): Secondary | ICD-10-CM | POA: Diagnosis not present

## 2016-02-05 DIAGNOSIS — I480 Paroxysmal atrial fibrillation: Secondary | ICD-10-CM | POA: Diagnosis not present

## 2016-02-05 DIAGNOSIS — M179 Osteoarthritis of knee, unspecified: Secondary | ICD-10-CM | POA: Diagnosis not present

## 2016-02-05 DIAGNOSIS — M1990 Unspecified osteoarthritis, unspecified site: Secondary | ICD-10-CM | POA: Diagnosis not present

## 2016-02-05 DIAGNOSIS — R2689 Other abnormalities of gait and mobility: Secondary | ICD-10-CM | POA: Diagnosis not present

## 2016-02-05 DIAGNOSIS — Z6839 Body mass index (BMI) 39.0-39.9, adult: Secondary | ICD-10-CM | POA: Diagnosis not present

## 2016-02-05 DIAGNOSIS — Z1389 Encounter for screening for other disorder: Secondary | ICD-10-CM | POA: Diagnosis not present

## 2016-05-20 DIAGNOSIS — H1130 Conjunctival hemorrhage, unspecified eye: Secondary | ICD-10-CM | POA: Diagnosis not present

## 2016-08-08 DIAGNOSIS — R8299 Other abnormal findings in urine: Secondary | ICD-10-CM | POA: Diagnosis not present

## 2016-08-08 DIAGNOSIS — E559 Vitamin D deficiency, unspecified: Secondary | ICD-10-CM | POA: Diagnosis not present

## 2016-08-08 DIAGNOSIS — E784 Other hyperlipidemia: Secondary | ICD-10-CM | POA: Diagnosis not present

## 2016-08-08 DIAGNOSIS — Z0000 Encounter for general adult medical examination without abnormal findings: Secondary | ICD-10-CM | POA: Diagnosis not present

## 2016-08-08 DIAGNOSIS — N390 Urinary tract infection, site not specified: Secondary | ICD-10-CM | POA: Diagnosis not present

## 2016-08-08 DIAGNOSIS — E038 Other specified hypothyroidism: Secondary | ICD-10-CM | POA: Diagnosis not present

## 2016-08-15 DIAGNOSIS — E784 Other hyperlipidemia: Secondary | ICD-10-CM | POA: Diagnosis not present

## 2016-08-15 DIAGNOSIS — Z23 Encounter for immunization: Secondary | ICD-10-CM | POA: Diagnosis not present

## 2016-08-15 DIAGNOSIS — N183 Chronic kidney disease, stage 3 (moderate): Secondary | ICD-10-CM | POA: Diagnosis not present

## 2016-08-15 DIAGNOSIS — R7309 Other abnormal glucose: Secondary | ICD-10-CM | POA: Diagnosis not present

## 2016-08-15 DIAGNOSIS — R05 Cough: Secondary | ICD-10-CM | POA: Diagnosis not present

## 2016-08-15 DIAGNOSIS — Z0000 Encounter for general adult medical examination without abnormal findings: Secondary | ICD-10-CM | POA: Diagnosis not present

## 2016-08-15 DIAGNOSIS — R808 Other proteinuria: Secondary | ICD-10-CM | POA: Diagnosis not present

## 2016-08-15 DIAGNOSIS — I129 Hypertensive chronic kidney disease with stage 1 through stage 4 chronic kidney disease, or unspecified chronic kidney disease: Secondary | ICD-10-CM | POA: Diagnosis not present

## 2016-08-15 DIAGNOSIS — Z6841 Body Mass Index (BMI) 40.0 and over, adult: Secondary | ICD-10-CM | POA: Diagnosis not present

## 2016-08-15 DIAGNOSIS — E038 Other specified hypothyroidism: Secondary | ICD-10-CM | POA: Diagnosis not present

## 2016-08-15 DIAGNOSIS — H6122 Impacted cerumen, left ear: Secondary | ICD-10-CM | POA: Diagnosis not present

## 2016-08-19 DIAGNOSIS — Z1212 Encounter for screening for malignant neoplasm of rectum: Secondary | ICD-10-CM | POA: Diagnosis not present

## 2016-08-23 DIAGNOSIS — R05 Cough: Secondary | ICD-10-CM | POA: Diagnosis not present

## 2016-08-23 DIAGNOSIS — I10 Essential (primary) hypertension: Secondary | ICD-10-CM | POA: Diagnosis not present

## 2016-08-28 DIAGNOSIS — Z1231 Encounter for screening mammogram for malignant neoplasm of breast: Secondary | ICD-10-CM | POA: Diagnosis not present

## 2016-09-09 DIAGNOSIS — R079 Chest pain, unspecified: Secondary | ICD-10-CM | POA: Diagnosis not present

## 2016-12-05 DIAGNOSIS — M859 Disorder of bone density and structure, unspecified: Secondary | ICD-10-CM | POA: Diagnosis not present

## 2016-12-05 DIAGNOSIS — N183 Chronic kidney disease, stage 3 (moderate): Secondary | ICD-10-CM | POA: Diagnosis not present

## 2016-12-05 DIAGNOSIS — E559 Vitamin D deficiency, unspecified: Secondary | ICD-10-CM | POA: Diagnosis not present

## 2017-02-13 DIAGNOSIS — Z6841 Body Mass Index (BMI) 40.0 and over, adult: Secondary | ICD-10-CM | POA: Diagnosis not present

## 2017-02-13 DIAGNOSIS — N183 Chronic kidney disease, stage 3 (moderate): Secondary | ICD-10-CM | POA: Diagnosis not present

## 2017-02-13 DIAGNOSIS — I129 Hypertensive chronic kidney disease with stage 1 through stage 4 chronic kidney disease, or unspecified chronic kidney disease: Secondary | ICD-10-CM | POA: Diagnosis not present

## 2017-02-13 DIAGNOSIS — M1990 Unspecified osteoarthritis, unspecified site: Secondary | ICD-10-CM | POA: Diagnosis not present

## 2017-02-13 DIAGNOSIS — R2689 Other abnormalities of gait and mobility: Secondary | ICD-10-CM | POA: Diagnosis not present

## 2017-02-13 DIAGNOSIS — E038 Other specified hypothyroidism: Secondary | ICD-10-CM | POA: Diagnosis not present

## 2017-02-13 DIAGNOSIS — I480 Paroxysmal atrial fibrillation: Secondary | ICD-10-CM | POA: Diagnosis not present

## 2017-03-14 ENCOUNTER — Ambulatory Visit (INDEPENDENT_AMBULATORY_CARE_PROVIDER_SITE_OTHER): Payer: PPO | Admitting: Orthopaedic Surgery

## 2017-03-14 ENCOUNTER — Ambulatory Visit (INDEPENDENT_AMBULATORY_CARE_PROVIDER_SITE_OTHER): Payer: PPO

## 2017-03-14 DIAGNOSIS — G8929 Other chronic pain: Secondary | ICD-10-CM | POA: Diagnosis not present

## 2017-03-14 DIAGNOSIS — M1712 Unilateral primary osteoarthritis, left knee: Secondary | ICD-10-CM

## 2017-03-14 DIAGNOSIS — M25562 Pain in left knee: Secondary | ICD-10-CM

## 2017-03-14 DIAGNOSIS — M25561 Pain in right knee: Secondary | ICD-10-CM

## 2017-03-14 NOTE — Progress Notes (Signed)
Office Visit Note   Patient: [name]           Date of Birth: [birth year]           MRN: [id number] Visit Date: 03/14/2017              Requested by: Shon Baton, MD [place], [place] [address] PCP: Precious Reel, MD   Assessment & Plan: Visit Diagnoses:  1. Chronic pain of both knees   2. Unilateral primary osteoarthritis, left knee     Plan: I reviewed the x-ray findings with the patient and her husband.  She is ready to undergo knee replacement given severe difficulty with ADLs and declining quality of life. She understands risks benefits alternatives to surgery including DVT, incomplete relief of pain, infection, stiffness and she understands and wishes to proceed. We will get her scheduled at her convenience. In the near future  Follow-Up Instructions: Return if symptoms worsen or fail to improve.   Orders:  Orders Placed This Encounter  Procedures  . XR KNEE 3 VIEW LEFT  . XR KNEE 3 VIEW RIGHT   No orders of the defined types were placed in this encounter.     Procedures: No procedures performed   Clinical Data: No additional findings.   Subjective: Chief Complaint  Patient presents with  . Left Knee - Pain  . Right Knee - Pain    Patient is a 71 year old female who I'm seeing in the past for advanced degenerative joint disease of bilateral knees comes in with worsening pain is worse on the left than the right. She is having severe difficulty with ADLs. She has a worsening valgus deformity of her left knee. Injection has helped in the past but did not give her enough relief.. Mobic is also helped but has not given her enough relief.  She is currently ambulating with a cane which does only helped partially. Her quality of life has suffered significantly.    Review of Systems  Constitutional: Negative.   HENT: Negative.   Eyes: Negative.   Respiratory: Negative.   Cardiovascular: Negative.   Endocrine: Negative.   Musculoskeletal: Negative.    Neurological: Negative.   Hematological: Negative.   Psychiatric/Behavioral: Negative.   All other systems reviewed and are negative.    Objective: Vital Signs: There were no vitals taken for this visit.  Physical Exam  Constitutional: She is oriented to person, place, and time. She appears well-developed and well-nourished.  Pulmonary/Chest: Effort normal.  Neurological: She is alert and oriented to person, place, and time.  Skin: Skin is warm. Capillary refill takes less than 2 seconds.  Psychiatric: She has a normal mood and affect. Her behavior is normal. Judgment and thought content normal.  Nursing note and vitals reviewed.   Ortho Exam Left knee exam shows a valgus deformity of approximately 15. Her range of motion is appropriate. She has no joint effusion. Collaterals and cruciates are stable. Specialty Comments:  No specialty comments available.  Imaging: Xr Knee 3 View Left  Result Date: 03/14/2017 Advanced degenerative joint disease with valgus deformity.  Xr Knee 3 View Right  Result Date: 03/14/2017 Advanced degenerative joint disease with varus deformity.    PMFS History: There are no active problems to display for this patient.  Past Medical History:  Diagnosis Date  . Arthritis   . Hyperlipidemia   . Hypertension   . Hypothyroidism     Family History  Problem Relation Age of Onset  . Colon cancer  Neg Hx   . Stomach cancer Neg Hx     Past Surgical History:  Procedure Laterality Date  . BACK SURGERY  2012  . TUBAL LIGATION  1977   Social History   Occupational History  . Not on file.   Social History Main Topics  . Smoking status: Never Smoker  . Smokeless tobacco: Never Used  . Alcohol use No  . Drug use: No  . Sexual activity: Not on file

## 2017-04-15 ENCOUNTER — Other Ambulatory Visit (INDEPENDENT_AMBULATORY_CARE_PROVIDER_SITE_OTHER): Payer: Self-pay | Admitting: Orthopaedic Surgery

## 2017-04-15 DIAGNOSIS — M1712 Unilateral primary osteoarthritis, left knee: Secondary | ICD-10-CM

## 2017-04-15 NOTE — Pre-Procedure Instructions (Signed)
[name]  04/15/2017      [id number] - [place], [place] - [name] [place] [address] Phone: [phone] Fax: [phone]    Your procedure is scheduled on May 24  Report to [facility] at 1000 A.M.  Call this number if you have problems the morning of surgery:  [phone]   Remember:  Do not eat food or drink liquids after midnight.   Take these medicines the morning of surgery with A SIP OF WATER atenolol (TENORMIN),   levothyroxine (SYNTHROID, LEVOTHROID),   7 days prior to surgery STOP taking any Aleve, Naproxen, Ibuprofen, Motrin, Advil, Goody's, BC's, all herbal medications, fish oil, and all vitamins  Follow your doctors instructions regarding your Aspirin.  If no instructions were given by the doctor you will need to call the office to get instructions.  Your pre admission RN will also call for those instructions    Do not wear jewelry, make-up or nail polish.  Do not wear lotions, powders, or perfumes, or deoderant.  Do not shave 48 hours prior to surgery.  Men may shave face and neck.  Do not bring valuables to the hospital.  [facility] is not responsible for any belongings or valuables.  Contacts, dentures or bridgework may not be worn into surgery.  Leave your suitcase in the car.  After surgery it may be brought to your room.  For patients admitted to the hospital, discharge time will be determined by your treatment team.  Patients discharged the day of surgery will not be allowed to drive home.    Special instructions:   [facility]- Preparing For Surgery  Before surgery, you can play an important role. Because skin is not sterile, your skin needs to be as free of germs as possible. You can reduce the number of germs on your skin by washing with CHG (chlorahexidine gluconate) Soap before surgery.  CHG is an antiseptic cleaner which kills germs and bonds  with the skin to continue killing germs even after washing.  Please do not use if you have an allergy to CHG or antibacterial soaps. If your skin becomes reddened/irritated stop using the CHG.  Do not shave (including legs and underarms) for at least 48 hours prior to first CHG shower. It is OK to shave your face.  Please follow these instructions carefully.   1. Shower the NIGHT BEFORE SURGERY and the MORNING OF SURGERY with CHG.   2. If you chose to wash your hair, wash your hair first as usual with your normal shampoo.  3. After you shampoo, rinse your hair and body thoroughly to remove the shampoo.  4. Use CHG as you would any other liquid soap. You can apply CHG directly to the skin and wash gently with a scrungie or a clean washcloth.   5. Apply the CHG Soap to your body ONLY FROM THE NECK DOWN.  Do not use on open wounds or open sores. Avoid contact with your eyes, ears, mouth and genitals (private parts). Wash genitals (private parts) with your normal soap.  6. Wash thoroughly, paying special attention to the area where your surgery will be performed.  7. Thoroughly rinse your body with warm water from the neck down.  8. DO NOT shower/wash with your normal soap after using and rinsing off the CHG Soap.  9. Pat yourself dry with a CLEAN TOWEL.   10.  Andrew   11. Place CLEAN SHEETS on your bed the night of your first shower and DO NOT SLEEP WITH PETS.    Day of Surgery: Do not apply any deodorants/lotions. Please wear clean clothes to the hospital/surgery center.      Please read over the following fact sheets that you were given.

## 2017-04-16 ENCOUNTER — Emergency Department (HOSPITAL_COMMUNITY)
Admission: EM | Admit: 2017-04-16 | Discharge: 2017-04-16 | Disposition: A | Discharge status: Home / Self Care | Payer: PPO | Source: Home / Self Care | Attending: Emergency Medicine | Admitting: Emergency Medicine

## 2017-04-16 ENCOUNTER — Encounter (HOSPITAL_COMMUNITY): Payer: Self-pay

## 2017-04-16 ENCOUNTER — Encounter (HOSPITAL_COMMUNITY)
Admission: RE | Admit: 2017-04-16 | Discharge: 2017-04-16 | Disposition: A | Discharge status: Home / Self Care | Payer: PPO | Source: Ambulatory Visit | Attending: Orthopaedic Surgery | Admitting: Orthopaedic Surgery

## 2017-04-16 ENCOUNTER — Emergency Department (HOSPITAL_COMMUNITY): Payer: PPO

## 2017-04-16 ENCOUNTER — Encounter (HOSPITAL_COMMUNITY): Payer: Self-pay | Admitting: Emergency Medicine

## 2017-04-16 ENCOUNTER — Encounter (HOSPITAL_COMMUNITY): Payer: Self-pay | Admitting: *Deleted

## 2017-04-16 DIAGNOSIS — E039 Hypothyroidism, unspecified: Secondary | ICD-10-CM

## 2017-04-16 DIAGNOSIS — Z01818 Encounter for other preprocedural examination: Secondary | ICD-10-CM | POA: Insufficient documentation

## 2017-04-16 DIAGNOSIS — Z7982 Long term (current) use of aspirin: Secondary | ICD-10-CM | POA: Insufficient documentation

## 2017-04-16 DIAGNOSIS — I129 Hypertensive chronic kidney disease with stage 1 through stage 4 chronic kidney disease, or unspecified chronic kidney disease: Secondary | ICD-10-CM | POA: Diagnosis not present

## 2017-04-16 DIAGNOSIS — R0609 Other forms of dyspnea: Secondary | ICD-10-CM | POA: Diagnosis not present

## 2017-04-16 DIAGNOSIS — N183 Chronic kidney disease, stage 3 (moderate): Secondary | ICD-10-CM | POA: Diagnosis not present

## 2017-04-16 DIAGNOSIS — Z79899 Other long term (current) drug therapy: Secondary | ICD-10-CM

## 2017-04-16 DIAGNOSIS — Z6838 Body mass index (BMI) 38.0-38.9, adult: Secondary | ICD-10-CM | POA: Insufficient documentation

## 2017-04-16 DIAGNOSIS — E669 Obesity, unspecified: Secondary | ICD-10-CM | POA: Insufficient documentation

## 2017-04-16 DIAGNOSIS — I10 Essential (primary) hypertension: Secondary | ICD-10-CM

## 2017-04-16 DIAGNOSIS — Z01812 Encounter for preprocedural laboratory examination: Secondary | ICD-10-CM | POA: Insufficient documentation

## 2017-04-16 DIAGNOSIS — I4891 Unspecified atrial fibrillation: Secondary | ICD-10-CM | POA: Insufficient documentation

## 2017-04-16 DIAGNOSIS — N189 Chronic kidney disease, unspecified: Secondary | ICD-10-CM | POA: Diagnosis not present

## 2017-04-16 DIAGNOSIS — M1712 Unilateral primary osteoarthritis, left knee: Secondary | ICD-10-CM | POA: Diagnosis not present

## 2017-04-16 DIAGNOSIS — I480 Paroxysmal atrial fibrillation: Secondary | ICD-10-CM | POA: Diagnosis not present

## 2017-04-16 DIAGNOSIS — Z0183 Encounter for blood typing: Secondary | ICD-10-CM | POA: Insufficient documentation

## 2017-04-16 HISTORY — DX: Cardiac arrhythmia, unspecified: I49.9

## 2017-04-16 HISTORY — DX: Pure hypercholesterolemia, unspecified: E78.00

## 2017-04-16 LAB — CBC WITH DIFFERENTIAL/PLATELET
Basophils Absolute: 0 10*3/uL (ref 0.0–0.1)
Basophils Relative: 1 %
Eosinophils Absolute: 0.2 10*3/uL (ref 0.0–0.7)
Eosinophils Relative: 3 %
HCT: 45.4 % (ref 36.0–46.0)
Hemoglobin: 14.6 g/dL (ref 12.0–15.0)
Lymphocytes Relative: 13 %
Lymphs Abs: 1.1 10*3/uL (ref 0.7–4.0)
MCH: 26.6 pg (ref 26.0–34.0)
MCHC: 32.2 g/dL (ref 30.0–36.0)
MCV: 82.8 fL (ref 78.0–100.0)
Monocytes Absolute: 0.7 10*3/uL (ref 0.1–1.0)
Monocytes Relative: 8 %
Neutro Abs: 6.3 10*3/uL (ref 1.7–7.7)
Neutrophils Relative %: 75 %
Platelets: 266 10*3/uL (ref 150–400)
RBC: 5.48 MIL/uL — ABNORMAL HIGH (ref 3.87–5.11)
RDW: 13.9 % (ref 11.5–15.5)
WBC: 8.3 10*3/uL (ref 4.0–10.5)

## 2017-04-16 LAB — PROTIME-INR
INR: 0.91
Prothrombin Time: 12.2 seconds (ref 11.4–15.2)

## 2017-04-16 LAB — TYPE AND SCREEN
ABO/RH(D): A NEG
Antibody Screen: NEGATIVE

## 2017-04-16 LAB — SEDIMENTATION RATE: Sed Rate: 9 mm/hr (ref 0–22)

## 2017-04-16 LAB — C-REACTIVE PROTEIN: CRP: <0.8 mg/dL (ref <1.0)

## 2017-04-16 LAB — COMPREHENSIVE METABOLIC PANEL
ALT: 14 U/L (ref 14–54)
AST: 23 U/L (ref 15–41)
Albumin: 3.8 g/dL (ref 3.5–5.0)
Alkaline Phosphatase: 60 U/L (ref 38–126)
Anion gap: 9 (ref 5–15)
BUN: 13 mg/dL (ref 6–20)
CO2: 27 mmol/L (ref 22–32)
Calcium: 9.3 mg/dL (ref 8.9–10.3)
Chloride: 103 mmol/L (ref 101–111)
Creatinine, Ser: 1.34 mg/dL — ABNORMAL HIGH (ref 0.44–1.00)
GFR calc Af Amer: 45 mL/min — ABNORMAL LOW (ref >60)
GFR calc non Af Amer: 39 mL/min — ABNORMAL LOW (ref >60)
Glucose, Bld: 111 mg/dL — ABNORMAL HIGH (ref 65–99)
Potassium: 4 mmol/L (ref 3.5–5.1)
Sodium: 139 mmol/L (ref 135–145)
Total Bilirubin: 0.5 mg/dL (ref 0.3–1.2)
Total Protein: 7.1 g/dL (ref 6.5–8.1)

## 2017-04-16 LAB — SURGICAL PCR SCREEN
MRSA, PCR: POSITIVE — AB
Staphylococcus aureus: POSITIVE — AB

## 2017-04-16 LAB — ABO/RH: ABO/RH(D): A NEG

## 2017-04-16 LAB — APTT: aPTT: 30 seconds (ref 24–36)

## 2017-04-16 MED ORDER — APIXABAN 5 MG PO TABS: 5.0000 mg | ORAL_TABLET | Freq: Two times a day (BID) | ORAL | 6 refills | Status: DC

## 2017-04-16 MED ORDER — APIXABAN 5 MG PO TABS
5.0000 mg | ORAL_TABLET | Freq: Once | ORAL | Status: AC
  Administered 2017-04-16: 5 mg via ORAL
  Filled 2017-04-16: qty 1

## 2017-04-16 NOTE — Consult Note (Addendum)
CARDIOLOGY CONSULT NOTE  Patient ID: [name] MRN: [id number] DOB/AGE: [birth year] 71 y.o.  Admit date: 04/16/2017 Referring Physician  Donnie Coffin Primary Physician:  Shon Baton, MD Reason for Consultation  A. FIb  HPI: [name]  is a 72 y.o. female  With History of hypertension, hyperlipidemia, obesity, who was being evaluated for left knee replacement, presented to the preoperative evaluation, EKG was obtained which revealed atrial fibrillation. Patient was referred to the emergency room for further evaluation. Patient has chronic shortness of breath and dyspnea on exertion and over the past one year has noticed gradual decrease in excess tolerance. She also has markedly sedentary lifestyle. Denies any palpitations, chest pain, PND or orthopnea. She has mild leg edema which she says is stable. No hemoptysis. She denies any GI bleeding, states that she has recently had colonoscopy and was normal. She is accompanied by her daughter and husband at the bedside.  Past Medical History:  Diagnosis Date  . Arthritis   . Dysrhythmia    new onset afib   . High cholesterol   . Hyperlipidemia   . Hypertension   . Hypothyroidism      Past Surgical History:  Procedure Laterality Date  . BACK SURGERY  2012  . COLONOSCOPY    . TUBAL LIGATION  1977     Family History  Problem Relation Age of Onset  . Colon cancer Neg Hx   . Stomach cancer Neg Hx      Social History: Social History   Social History  . Marital status: Married    Spouse name: N/A  . Number of children: N/A  . Years of education: N/A   Occupational History  . Not on file.   Social History Main Topics  . Smoking status: Never Smoker  . Smokeless tobacco: Never Used  . Alcohol use No  . Drug use: No  . Sexual activity: Not on file   Other Topics Concern  . Not on file   Social History Narrative  . No narrative on file    Review of Systems - Negative except Mild bilateral leg edema, shortness of  breath with dyspnea on exertion that is chronic, severe degenerative joint disease involving both the knee.    Physical Exam: Blood pressure 131/85, pulse 77, temperature 98 F (36.7 C), temperature source Oral, resp. rate (!) 21, height 5\' 7"  (1.702 m), weight 108.9 kg (240 lb), SpO2 98 %. Body mass index is 37.59 kg/m.   General appearance: alert, cooperative, appears stated age, no distress and moderately obese Lungs: clear to auscultation bilaterally Chest wall: no tenderness Heart: S1 is variable, S2 is normal, distant heart sounds due to obesity. No gallop appreciated. No murmur. Abdomen: soft, non-tender; bowel sounds normal; no masses,  no organomegaly and Obese and small pannus present. Extremities: edema Obvious swelling of the left knee joint and mild tenderness and mild swelling of the right knee joint present. Trace bilateral leg edema. and No acute tenderness, Homans sign negative. Pulses: No carotid bruit. Femoral pulses faint due to bodily habitus. Popliteal pulses could not be felt due to bodily habitus and discomfort in exam, pedal pulses are absent. Capillary refill normal. No evidence of acute arterial insufficiency. Neurologic: Grossly normal  Labs:  Lab Results  Component Value Date   WBC 8.3 04/16/2017   HGB 14.6 04/16/2017   HCT 45.4 04/16/2017   MCV 82.8 04/16/2017   PLT 266 04/16/2017    Recent Labs Lab 04/16/17 1018  NA 139  K  4.0  CL 103  CO2 27  BUN 13  CREATININE 1.34*  CALCIUM 9.3  PROT 7.1  BILITOT 0.5  ALKPHOS 60  ALT 14  AST 23  GLUCOSE 111*    Lipid Panel  No results found for: CHOL, TRIG, HDL, CHOLHDL, VLDL, LDLCALC  BNP (last 3 results) No results for input(s): BNP in the last 8760 hours.  HEMOGLOBIN A1C No results found for: HGBA1C, MPG  Cardiac Panel (last 3 results) No results for input(s): CKTOTAL, CKMB, TROPONINI, RELINDX in the last 8760 hours.  No results found for: CKTOTAL, CKMB, CKMBINDEX, TROPONINI   TSH No  results for input(s): TSH in the last 8760 hours.    Radiology: Dg Chest 2 View  Result Date: 04/16/2017 CLINICAL DATA:  New onset atrial fibrillation EXAM: CHEST  2 VIEW COMPARISON:  01/11/2011 FINDINGS: No cardiomegaly. Peripheral reticulation on the left more than right that is progressed. There is no generalized Kerley lines, consolidation, effusion, or pneumothorax. Bulky spondylosis, which likely accounts for undulation along the descending aortic shadow. No acute osseous finding. IMPRESSION: 1. No acute finding. 2. Chronic lung disease that has likely progressed from 2012. Electronically Signed   By: Monte Fantasia M.D.   On: 04/16/2017 11:58    Scheduled Meds: Continuous Infusions: PRN Meds:.  CARDIAC STUDIES:  EKG: 04/16/2017: Atrial fibrillation with controlled ventricular response, nonspecific T-wave flattening, no evidence of ischemia, normal QT interval.  ASSESSMENT AND PLAN:  1. New onset of atrial fibrillation with controlled ventricle response, duration unknown. CHA2DS2-VASCScore: Risk Score  3,  Yearly risk of stroke  3.2. Recommendation: ASA No/Anticoagulation Yes  2. Hypertension 3. Hyperlipidemia 4. Moderate obesity 5. Shortness of breath and dyspnea on exertion 6. Preoperative cardiac risk evaluation. 7. Stage III chronic kidney disease secondary to hypertension.  Recommendation: Due to high cardioembolic risk factors, patient needs to be on long-term and to correlation. I had a long discussion with the patient and her daughter and husband at the bedside regarding the need for anti-correlation, discussed regarding Coumadin versus newer agents, patient is willing to start Eliquis final gram by mouth twice a day.  Due to shortness of breath and dyspnea on exertion, abnormal physical exam suggestive of peripheral arterial disease with decreased pulses, hypertension and hyperlipidemia as risk factors, would also recommend preoperative stress testing. I will also set her  up for an echocardiogram. However these can be performed in the outpatient basis. From cardiac standpoint she can be discharged home today. Due to hyperlipidemia, and to protect cardiovascular, she is presently on pravastatin at home at 80 mg, advised her to continue the same. Otherwise no changes in the medications were done today.  Adrian Prows, MD 04/16/2017, 1:56 PM [facility] [address] Pager: [phone] Office: [phone] If no answer Cell [phone]

## 2017-04-16 NOTE — ED Triage Notes (Signed)
Pt reports having pre-op appt today for knee surgery next week. Was sent here due to being in afib on ekg, no hx of same.  pt denies any complaints.

## 2017-04-16 NOTE — Progress Notes (Signed)
Prescription called in at [facility] in [place] patient verbalized that it was ok to leave message on the answering machiene, left call back number if patient had any questions

## 2017-04-16 NOTE — Progress Notes (Signed)
PCP - Shon Baton Cardiologist - denies  Chest x-ray - not needed EKG - 04/16/17 Stress Test - denies ECHO - denies Cardiac Cath - denies      Patient denies shortness of breath, fever, cough and chest pain at PAT appointment   Patient verbalized understanding of instructions that were given to them at the PAT appointment. Patient was also instructed that they will need to review over the PAT instructions again at home before surgery.

## 2017-04-16 NOTE — ED Notes (Signed)
Dr Gangi at bedside 

## 2017-04-16 NOTE — Progress Notes (Addendum)
Anesthesia chart review:   Pt is a 71 year old female scheduled for L total knee arthroplasty on 04/24/2017 with [name], MD.   Called to see pt in PAT for new onset atrial fibrillation on EKG.  No hx afib. Pt denies feeling palpitations or irregular heart beat.  Sent to ER for further evaluation. Pt saw Kela Millin, MD with cardiology while in ER. Eliquis initiated. Dr. Einar Gip recommended pt have pre-op stress test and echo; this will be arranged as an outpatient.  I notified Sherrie in Dr. Phoebe Sharps office of today's occurrences.   PMH includes: atrial fibrillation (new onset, unknown duration, identified 04/16/17),   Medications include: Amlodipine, Eliquis (inititated 04/16/17), ASA 81 mg, atenolol, Lasix, levothyroxine, losartan, pravastatin.   BP 117/72   Pulse 90   Temp 36.6 C   Resp 20   Ht 5\' 6"  (1.676 m)   Wt 240 lb 3.2 oz (109 kg)   SpO2 98%   BMI 38.77 kg/m    Preoperative labs reviewed.    CXR 04/16/17:  1. No acute finding. 2. Chronic lung disease that has likely progressed from 2012.  EKG 04/16/17: Atrial fibrillation (86 bpm). Low voltage QRS  PCP is Shon Baton, MD.  I will forward EKG and my note to him to keep him informed.    Will revisit chart when cardiac testing results available.   Willeen Cass, FNP-BC [facility]/Anesthesiology Phone: [phone] 04/16/2017 3:50 PM  Addendum:   Nuclear stress test 04/18/17 [facility] Cardiovascular): 1. Resting EKG demonstrated atrial fibrillation, normal resting conduction and normal rest repolarization.  2. Overall quality of study is good. No evidence of abnormal lung activity. Stress and rest SPECT images demonstrate homogeneous tracer distribution throughout the myocardium. Gated SPECT imaging reveals normal myocardial thickening and wall motion. LVEF was normal visually, but calculated at 40%. LVEF ablation may be impaired due to difficulty in getting from atrial fibrillation. This is low risk  study.   I spoke with Sherrie in Dr. Phoebe Sharps office. Pt was started on eliquis and per pt, Dr. Einar Gip instructed her she would need to be on it for a few weeks prior to surgery.  Sherrie indicated case would be moved to June.   Willeen Cass, FNP-BC [facility] Short Stay Surgical Center/Anesthesiology Phone: [phone] 04/22/2017 10:30 AM

## 2017-04-16 NOTE — ED Provider Notes (Addendum)
[place] DEPT Provider Note   CSN: [id number] Arrival date & time: 04/16/17  1053     History   Chief Complaint Chief Complaint  Patient presents with  . Atrial Fibrillation    HPI [name] is a 71 y.o. female. Patient went for elective preoperative as outpatient this morning, for elective knee surgery scheduled for 04/24/2017. Preoperative EKG showed atrial fibrillation. She was sent here for further evaluation. No treatment prior to coming here. She denies any chest pain shortness breath lightheadedness or other associated symptoms HPI  Past Medical History:  Diagnosis Date  . Arthritis   . Dysrhythmia    new onset afib   . High cholesterol   . Hyperlipidemia   . Hypertension   . Hypothyroidism     There are no active problems to display for this patient.   Past Surgical History:  Procedure Laterality Date  . BACK SURGERY  2012  . COLONOSCOPY    . TUBAL LIGATION  1977    OB History    No data available       Home Medications    Prior to Admission medications   Medication Sig Start Date End Date Taking? Authorizing Provider  amLODipine (NORVASC) 10 MG tablet Take 10 mg by mouth daily at 3 pm.  06/19/12   [provider]  Aromatic Inhalants (VICKS VAPOR INHALER IN) Inhale 1 application into the lungs daily as needed (nasal congestion).    [provider]  aspirin 81 MG tablet Take 81 mg by mouth daily.    [provider]  atenolol (TENORMIN) 100 MG tablet Take 100 mg by mouth daily at 3 pm.  06/19/12   [provider]  Calcium Carbonate-Vit D-Min (CALCIUM 600+D PLUS MINERALS PO) Take 1 tablet by mouth daily.    [provider]  furosemide (LASIX) 40 MG tablet Take 40 mg by mouth daily.  06/19/12   [provider]  levothyroxine (SYNTHROID, LEVOTHROID) 125 MCG tablet Take 125 mcg by mouth daily.  06/19/12   [provider]  losartan (COZAAR) 25 MG tablet Take 25 mg by mouth daily.  03/25/17    [provider]  Multiple Vitamin (MULTIVITAMIN) tablet Take 1 tablet by mouth daily.    [provider]  naproxen sodium (ANAPROX) 220 MG tablet Take 440 mg by mouth daily as needed (pain).    [provider]  pravastatin (PRAVACHOL) 80 MG tablet Take 80 mg by mouth daily at 3 pm.  07/16/12   [provider]    Family History Family History  Problem Relation Age of Onset  . Colon cancer Neg Hx   . Stomach cancer Neg Hx     Social History Social History  Substance Use Topics  . Smoking status: Never Smoker  . Smokeless tobacco: Never Used  . Alcohol use No     Allergies   Patient has no known allergies.   Review of Systems Review of Systems  Constitutional: Negative.   HENT: Negative.   Respiratory: Negative.   Cardiovascular: Negative.   Gastrointestinal: Negative.   Musculoskeletal: Positive for arthralgias and gait problem.       Walks with cane  Skin: Negative.   Psychiatric/Behavioral: Negative.   All other systems reviewed and are negative.    Physical Exam Updated Vital Signs BP 138/79   Pulse 77   Temp 98 F (36.7 C) (Oral)   Resp 17   Ht 5\' 7"  (1.702 m)   Wt 240 lb (108.9 kg)  SpO2 99%   BMI 37.59 kg/m   Physical Exam  Constitutional: She appears well-developed and well-nourished.  HENT:  Head: Normocephalic and atraumatic.  Eyes: Conjunctivae are normal. Pupils are equal, round, and reactive to light.  Neck: Neck supple. No tracheal deviation present. No thyromegaly present.  Cardiovascular: Normal rate.   No murmur heard. Irregularly irregular  Pulmonary/Chest: Effort normal and breath sounds normal.  Abdominal: Soft. Bowel sounds are normal. She exhibits no distension. There is no tenderness.  Musculoskeletal: Normal range of motion. She exhibits no edema or tenderness.  Neurological: She is alert. Coordination normal.  Skin: Skin is warm and dry. No rash noted.  Psychiatric: She has a normal mood and  affect.  Nursing note and vitals reviewed.    ED Treatments / Results  Labs (all labs ordered are listed, but only abnormal results are displayed) Labs Reviewed  BASIC METABOLIC PANEL  CBC    EKG  EKG Interpretation None     ED ECG REPORT   Date: 04/16/2017  Rate: 85  Rhythm: atrial fibrillation  QRS Axis: normal  Intervals: normal  ST/T Wave abnormalities: nonspecific T wave changes  Conduction Disutrbances:none  Narrative Interpretation:   Old EKG Reviewed: Atrial fibrillation new from prior tracing, unchanged from today  I have personally reviewed the EKG tracing and agree with the computerized printout as noted.  Radiology Dg Chest 2 View  Result Date: 04/16/2017 CLINICAL DATA:  New onset atrial fibrillation EXAM: CHEST  2 VIEW COMPARISON:  01/11/2011 FINDINGS: No cardiomegaly. Peripheral reticulation on the left more than right that is progressed. There is no generalized Kerley lines, consolidation, effusion, or pneumothorax. Bulky spondylosis, which likely accounts for undulation along the descending aortic shadow. No acute osseous finding. IMPRESSION: 1. No acute finding. 2. Chronic lung disease that has likely progressed from 2012. Electronically Signed   By: Monte Fantasia M.D.   On: 04/16/2017 11:58    Procedures Procedures (including critical care time)  Medications Ordered in ED Medications - No data to display  RR Ganji consulted and will see patient in ED. He suggests starting patient on Eliquis, first dose to be given here. Dr Einar Gip arranged for outpt f/u at office and wrote rx for eluiqius chestx-ray viewed by me Lab work from earlier today reviewed by me Initial Impression / Assessment and Plan / ED Course  I have reviewed the triage vital signs and the nursing notes.  Pertinent labs & imaging results that were available during my care of the patient were reviewed by me and considered in my medical decision making (see chart for details).      CHA2DS2/VAS Stroke Risk Points  =3               Final Clinical Impressions(s) / ED Diagnoses  Diagnosis atrial fibrillation Final diagnoses:  None    New Prescriptions New Prescriptions   No medications on file     Orlie Dakin, MD 04/16/17 [place], MD 04/16/17 1313    Orlie Dakin, MD 04/16/17 1316    Orlie Dakin, MD 04/16/17 1337    Orlie Dakin, MD 04/16/17 1413

## 2017-04-16 NOTE — Discharge Instructions (Signed)
Follow-up with Dr. Einar Gip as scheduled

## 2017-04-16 NOTE — Progress Notes (Addendum)
Spoke with RN Elmyra Ricks in ED about patient's positive MRSA swab   Patient will need to be treated for the positive PCR if admitted.  If patient is discharged from the ED then prescription will need to be called in for the patient

## 2017-04-16 NOTE — ED Notes (Signed)
IV attempt x 2 unsuccessful.

## 2017-04-16 NOTE — Progress Notes (Signed)
Patient presented with new onset Afib after EKG at pre-op appointment.  Levada Dy NP called to see patient.  Levada Dy recommended patient be seen in the emergency room.    Charge RN of ED called made aware of situation.  Patient taken to the ED to be evaluated Patient presented with no symptoms of the Afib with a rate of 90

## 2017-04-17 ENCOUNTER — Other Ambulatory Visit (INDEPENDENT_AMBULATORY_CARE_PROVIDER_SITE_OTHER): Payer: Self-pay | Admitting: Family

## 2017-04-17 ENCOUNTER — Other Ambulatory Visit (INDEPENDENT_AMBULATORY_CARE_PROVIDER_SITE_OTHER): Payer: Self-pay

## 2017-04-17 NOTE — Progress Notes (Signed)
Patient called to confirm that she receive my message about the prescription. And that they may put her surgery off for a few weeks to get her taken care of from a cardiac standpoint.  Patient has an appointment tomorrow with a cardiologist

## 2017-04-18 DIAGNOSIS — R0602 Shortness of breath: Secondary | ICD-10-CM | POA: Diagnosis not present

## 2017-04-18 DIAGNOSIS — I4891 Unspecified atrial fibrillation: Secondary | ICD-10-CM | POA: Diagnosis not present

## 2017-04-22 ENCOUNTER — Other Ambulatory Visit (INDEPENDENT_AMBULATORY_CARE_PROVIDER_SITE_OTHER): Payer: Self-pay | Admitting: Orthopaedic Surgery

## 2017-04-24 ENCOUNTER — Ambulatory Visit (HOSPITAL_COMMUNITY): Admission: RE | Admit: 2017-04-24 | Payer: PPO | Source: Ambulatory Visit | Admitting: Orthopaedic Surgery

## 2017-04-24 SURGERY — ARTHROPLASTY, KNEE, TOTAL
Anesthesia: Spinal | Site: Knee | Laterality: Left

## 2017-04-30 DIAGNOSIS — I481 Persistent atrial fibrillation: Secondary | ICD-10-CM | POA: Diagnosis not present

## 2017-04-30 DIAGNOSIS — R0602 Shortness of breath: Secondary | ICD-10-CM | POA: Diagnosis not present

## 2017-05-07 DIAGNOSIS — R0602 Shortness of breath: Secondary | ICD-10-CM | POA: Diagnosis not present

## 2017-05-07 DIAGNOSIS — Z01810 Encounter for preprocedural cardiovascular examination: Secondary | ICD-10-CM | POA: Diagnosis not present

## 2017-05-07 DIAGNOSIS — I10 Essential (primary) hypertension: Secondary | ICD-10-CM | POA: Diagnosis not present

## 2017-05-07 DIAGNOSIS — I4891 Unspecified atrial fibrillation: Secondary | ICD-10-CM | POA: Diagnosis not present

## 2017-05-08 ENCOUNTER — Inpatient Hospital Stay (INDEPENDENT_AMBULATORY_CARE_PROVIDER_SITE_OTHER): Payer: PPO | Admitting: Orthopaedic Surgery

## 2017-05-20 ENCOUNTER — Encounter (HOSPITAL_COMMUNITY): Payer: Self-pay

## 2017-05-20 ENCOUNTER — Encounter (HOSPITAL_COMMUNITY)
Admission: RE | Admit: 2017-05-20 | Discharge: 2017-05-20 | Disposition: A | Discharge status: Home / Self Care | Payer: PPO | Source: Ambulatory Visit | Attending: Orthopaedic Surgery | Admitting: Orthopaedic Surgery

## 2017-05-20 ENCOUNTER — Other Ambulatory Visit (INDEPENDENT_AMBULATORY_CARE_PROVIDER_SITE_OTHER): Payer: Self-pay | Admitting: Orthopaedic Surgery

## 2017-05-20 DIAGNOSIS — E039 Hypothyroidism, unspecified: Secondary | ICD-10-CM | POA: Insufficient documentation

## 2017-05-20 DIAGNOSIS — Z79899 Other long term (current) drug therapy: Secondary | ICD-10-CM | POA: Diagnosis not present

## 2017-05-20 DIAGNOSIS — I4891 Unspecified atrial fibrillation: Secondary | ICD-10-CM | POA: Diagnosis not present

## 2017-05-20 DIAGNOSIS — Z7901 Long term (current) use of anticoagulants: Secondary | ICD-10-CM | POA: Diagnosis not present

## 2017-05-20 DIAGNOSIS — I10 Essential (primary) hypertension: Secondary | ICD-10-CM | POA: Insufficient documentation

## 2017-05-20 DIAGNOSIS — E785 Hyperlipidemia, unspecified: Secondary | ICD-10-CM | POA: Diagnosis not present

## 2017-05-20 DIAGNOSIS — Z01812 Encounter for preprocedural laboratory examination: Secondary | ICD-10-CM | POA: Diagnosis not present

## 2017-05-20 DIAGNOSIS — M1712 Unilateral primary osteoarthritis, left knee: Secondary | ICD-10-CM

## 2017-05-20 LAB — CBC WITH DIFFERENTIAL/PLATELET
Basophils Absolute: 0.1 10*3/uL (ref 0.0–0.1)
Basophils Relative: 1 %
Eosinophils Absolute: 0.1 10*3/uL (ref 0.0–0.7)
Eosinophils Relative: 1 %
HCT: 52.5 % — ABNORMAL HIGH (ref 36.0–46.0)
Hemoglobin: 16.9 g/dL — ABNORMAL HIGH (ref 12.0–15.0)
Lymphocytes Relative: 13 %
Lymphs Abs: 1.1 10*3/uL (ref 0.7–4.0)
MCH: 27.2 pg (ref 26.0–34.0)
MCHC: 32.2 g/dL (ref 30.0–36.0)
MCV: 84.5 fL (ref 78.0–100.0)
Monocytes Absolute: 0.6 10*3/uL (ref 0.1–1.0)
Monocytes Relative: 7 %
Neutro Abs: 6.8 10*3/uL (ref 1.7–7.7)
Neutrophils Relative %: 78 %
Platelets: 253 10*3/uL (ref 150–400)
RBC: 6.21 MIL/uL — ABNORMAL HIGH (ref 3.87–5.11)
RDW: 14.4 % (ref 11.5–15.5)
WBC: 8.7 10*3/uL (ref 4.0–10.5)

## 2017-05-20 LAB — COMPREHENSIVE METABOLIC PANEL
ALT: 13 U/L — ABNORMAL LOW (ref 14–54)
AST: 20 U/L (ref 15–41)
Albumin: 4.1 g/dL (ref 3.5–5.0)
Alkaline Phosphatase: 56 U/L (ref 38–126)
Anion gap: 8 (ref 5–15)
BUN: 16 mg/dL (ref 6–20)
CO2: 26 mmol/L (ref 22–32)
Calcium: 9.5 mg/dL (ref 8.9–10.3)
Chloride: 103 mmol/L (ref 101–111)
Creatinine, Ser: 1.48 mg/dL — ABNORMAL HIGH (ref 0.44–1.00)
GFR calc Af Amer: 40 mL/min — ABNORMAL LOW (ref >60)
GFR calc non Af Amer: 34 mL/min — ABNORMAL LOW (ref >60)
Glucose, Bld: 106 mg/dL — ABNORMAL HIGH (ref 65–99)
Potassium: 4 mmol/L (ref 3.5–5.1)
Sodium: 137 mmol/L (ref 135–145)
Total Bilirubin: 0.7 mg/dL (ref 0.3–1.2)
Total Protein: 7.1 g/dL (ref 6.5–8.1)

## 2017-05-20 LAB — URINALYSIS, ROUTINE W REFLEX MICROSCOPIC
Bilirubin Urine: NEGATIVE
Glucose, UA: NEGATIVE mg/dL
Ketones, ur: NEGATIVE mg/dL
Nitrite: NEGATIVE
Protein, ur: NEGATIVE mg/dL
Specific Gravity, Urine: 1.005 (ref 1.005–1.030)
pH: 7 (ref 5.0–8.0)

## 2017-05-20 LAB — C-REACTIVE PROTEIN: CRP: 1.4 mg/dL — ABNORMAL HIGH (ref <1.0)

## 2017-05-20 LAB — TYPE AND SCREEN
ABO/RH(D): A NEG
Antibody Screen: NEGATIVE

## 2017-05-20 LAB — SURGICAL PCR SCREEN
MRSA, PCR: NEGATIVE
Staphylococcus aureus: NEGATIVE

## 2017-05-20 LAB — APTT: aPTT: 37 seconds — ABNORMAL HIGH (ref 24–36)

## 2017-05-20 LAB — SEDIMENTATION RATE: Sed Rate: 11 mm/hr (ref 0–22)

## 2017-05-20 NOTE — Progress Notes (Addendum)
PCP - Shon Baton Cardiologist - Dr. Adrian Prows  Chest x-ray - 04/16/17 EKG - 04/16/17 Stress Test - requesting from Dr. Einar Gip ECHO -  requesting from Dr. Einar Gip Cardiac Cath -  denies   Levada Dy note on 04/16/17  Patient saw cardiology - requesting records from office will send to anesthesia for review of those records last dose of Eliquis will be 05/26/17  Patient denies shortness of breath, fever, cough and chest pain at PAT appointment   Patient verbalized understanding of instructions that were given to them at the PAT appointment. Patient was also instructed that they will need to review over the PAT instructions again at home before surgery.

## 2017-05-20 NOTE — Pre-Procedure Instructions (Signed)
[name]  05/20/2017      [id number] - [place], [place] - [name] [place] [address] Phone: [phone] Fax: [phone]    Your procedure is scheduled on June 28  Report to [place] at [phone] A.M.  Call this number if you have problems the morning of surgery:  [phone]   Remember:  Do not eat food or drink liquids after midnight.   Take these medicines the morning of surgery with A SIP OF WATER acetaminophen (TYLENOL) if needed, amLODipine (NORVASC), atenolol (TENORMIN), levothyroxine (SYNTHROID, LEVOTHROID)   7 days prior to surgery STOP taking any Aspirin, Aleve, Naproxen, Ibuprofen, Motrin, Advil, Goody's, BC's, all herbal medications, fish oil, and all vitamins  Follow Physicians Instructions about ELIQUIS   Do not wear jewelry, make-up or nail polish.  Do not wear lotions, powders, or perfumes, or deoderant.  Do not shave 48 hours prior to surgery.    Do not bring valuables to the hospital.  [facility] is not responsible for any belongings or valuables.  Contacts, dentures or bridgework may not be worn into surgery.  Leave your suitcase in the car.  After surgery it may be brought to your room.  For patients admitted to the hospital, discharge time will be determined by your treatment team.  Patients discharged the day of surgery will not be allowed to drive home.    Special instructions:   [facility]- Preparing For Surgery  Before surgery, you can play an important role. Because skin is not sterile, your skin needs to be as free of germs as possible. You can reduce the number of germs on your skin by washing with CHG (chlorahexidine gluconate) Soap before surgery.  CHG is an antiseptic cleaner which kills germs and bonds with the skin to continue killing germs even after washing.  Please do not use if you have an allergy to CHG or antibacterial soaps. If  your skin becomes reddened/irritated stop using the CHG.  Do not shave (including legs and underarms) for at least 48 hours prior to first CHG shower. It is OK to shave your face.  Please follow these instructions carefully.   1. Shower the NIGHT BEFORE SURGERY and the MORNING OF SURGERY with CHG.   2. If you chose to wash your hair, wash your hair first as usual with your normal shampoo.  3. After you shampoo, rinse your hair and body thoroughly to remove the shampoo.  4. Use CHG as you would any other liquid soap. You can apply CHG directly to the skin and wash gently with a scrungie or a clean washcloth.   5. Apply the CHG Soap to your body ONLY FROM THE NECK DOWN.  Do not use on open wounds or open sores. Avoid contact with your eyes, ears, mouth and genitals (private parts). Wash genitals (private parts) with your normal soap.  6. Wash thoroughly, paying special attention to the area where your surgery will be performed.  7. Thoroughly rinse your body with warm water from the neck down.  8. DO NOT shower/wash with your normal soap after using and rinsing off the CHG Soap.  9. Pat yourself dry with a CLEAN TOWEL.   10. Wear CLEAN PAJAMAS   11. Place CLEAN SHEETS on your bed the night of your first shower and DO NOT SLEEP WITH PETS.    Day of Surgery: Do not apply  any deodorants/lotions. Please wear clean clothes to the hospital/surgery center.      Please read over the following fact sheets that you were given.

## 2017-05-21 NOTE — Progress Notes (Addendum)
Anesthesia Chart Review:  Pt is a 71 year old female scheduled for L total knee arthroplasty on 05/29/2017 with Patricia Shown, MD.   - PCP is Patricia Baton, MD - Cardiologist is Patricia Millin, MD  Surgery was originally scheduled 04/24/17 but was postponed due to new onset afib identified at pre-admission testing.   PMH includes: atrial fibrillation, HTN, hyperlipidemia, hypothyroidism. Never smoker. BMI 37  Medications include: Amlodipine, Eliquis, atenolol, Lasix, levothyroxine, losartan, pravastatin.  - I reached out to Dr. Einar [name] and received permission to hold eliquis 3 days before surgery.  I notified pt her last dose will be 05/25/17.    BP 117/69   Pulse 77   Temp 36.3 C   Resp 20   Ht 5\' 6"  (1.676 m)   Wt 230 lb (104.3 kg)   SpO2 97%   BMI 37.12 kg/m    Preoperative labs reviewed.  PTT 37. PT will be obtained, PTT will be rechecked DOS.   CXR 04/16/17:  1. No acute finding. 2. Chronic lung disease that has likely progressed from 2012.  EKG 05/07/17 [facility] cardiovascular): NSR  Echo 04/30/17 [facility] cardiovascular): 1. Poor echo window. Wall motion abnormality has reduced sensitivity. LV cavity normal in size. Mild decrease in LV systolic function. Global hypokinesis. Visual EF 40-45%. Unable to evaluate diastolic function due to atrial fibrillation. Calculated EF 52%. 2. LA cavity is moderately dilated by volume 3. Mild tricuspid regurgitation. Mild pulmonary hypertension. Pulmonary artery systolic pressure is estimated at 34 mmHg. 4. Mild pulmonic regurgitation.  Nuclear stress test 04/18/17: Low risk study.  EF visually normal but calculated at 40%. EF calculation may be an error due to atrial fibrillation.  If no changes, I anticipate pt can proceed with surgery as scheduled.   Willeen Cass, FNP-BC [facility] Short Stay Surgical Center/Anesthesiology Phone: [phone] 05/23/2017 4:30 PM

## 2017-05-28 MED ORDER — CEFAZOLIN SODIUM-DEXTROSE 2-4 GM/100ML-% IV SOLN
2.0000 g | INTRAVENOUS | Status: AC
  Administered 2017-05-29: 2 g via INTRAVENOUS
  Filled 2017-05-28: qty 100

## 2017-05-28 MED ORDER — BUPIVACAINE LIPOSOME 1.3 % IJ SUSP
20.0000 mL | INTRAMUSCULAR | Status: AC
  Administered 2017-05-29: 20 mL
  Filled 2017-05-28: qty 20

## 2017-05-28 MED ORDER — TRANEXAMIC ACID 1000 MG/10ML IV SOLN
1000.0000 mg | INTRAVENOUS | Status: AC
  Administered 2017-05-29: 1000 mg via INTRAVENOUS
  Filled 2017-05-28 (×2): qty 10

## 2017-05-29 ENCOUNTER — Inpatient Hospital Stay (HOSPITAL_COMMUNITY): Payer: PPO

## 2017-05-29 ENCOUNTER — Ambulatory Visit (HOSPITAL_COMMUNITY): Payer: PPO | Admitting: Emergency Medicine

## 2017-05-29 ENCOUNTER — Encounter (HOSPITAL_COMMUNITY): Payer: Self-pay | Admitting: Urology

## 2017-05-29 ENCOUNTER — Ambulatory Visit (HOSPITAL_COMMUNITY): Payer: PPO | Admitting: Certified Registered Nurse Anesthetist

## 2017-05-29 ENCOUNTER — Encounter (HOSPITAL_COMMUNITY)
Admission: RE | Disposition: A | Discharge status: Home Health | Payer: Self-pay | Source: Ambulatory Visit | Attending: Orthopaedic Surgery

## 2017-05-29 ENCOUNTER — Inpatient Hospital Stay (HOSPITAL_COMMUNITY)
Admission: RE | Admit: 2017-05-29 | Discharge: 2017-05-31 | DRG: 470 | Disposition: A | Discharge status: Home Health | Payer: PPO | Source: Ambulatory Visit | Attending: Orthopaedic Surgery | Admitting: Orthopaedic Surgery

## 2017-05-29 DIAGNOSIS — I509 Heart failure, unspecified: Secondary | ICD-10-CM | POA: Diagnosis not present

## 2017-05-29 DIAGNOSIS — I4891 Unspecified atrial fibrillation: Secondary | ICD-10-CM | POA: Diagnosis not present

## 2017-05-29 DIAGNOSIS — Z7901 Long term (current) use of anticoagulants: Secondary | ICD-10-CM

## 2017-05-29 DIAGNOSIS — E7800 Pure hypercholesterolemia, unspecified: Secondary | ICD-10-CM | POA: Diagnosis present

## 2017-05-29 DIAGNOSIS — D62 Acute posthemorrhagic anemia: Secondary | ICD-10-CM | POA: Diagnosis not present

## 2017-05-29 DIAGNOSIS — E785 Hyperlipidemia, unspecified: Secondary | ICD-10-CM | POA: Diagnosis present

## 2017-05-29 DIAGNOSIS — I110 Hypertensive heart disease with heart failure: Secondary | ICD-10-CM | POA: Diagnosis not present

## 2017-05-29 DIAGNOSIS — I10 Essential (primary) hypertension: Secondary | ICD-10-CM | POA: Diagnosis not present

## 2017-05-29 DIAGNOSIS — Z471 Aftercare following joint replacement surgery: Secondary | ICD-10-CM | POA: Diagnosis not present

## 2017-05-29 DIAGNOSIS — G8918 Other acute postprocedural pain: Secondary | ICD-10-CM | POA: Diagnosis not present

## 2017-05-29 DIAGNOSIS — Z96652 Presence of left artificial knee joint: Secondary | ICD-10-CM | POA: Diagnosis not present

## 2017-05-29 DIAGNOSIS — Z96659 Presence of unspecified artificial knee joint: Secondary | ICD-10-CM

## 2017-05-29 DIAGNOSIS — Z79899 Other long term (current) drug therapy: Secondary | ICD-10-CM

## 2017-05-29 DIAGNOSIS — M1712 Unilateral primary osteoarthritis, left knee: Secondary | ICD-10-CM | POA: Diagnosis not present

## 2017-05-29 HISTORY — PX: TOTAL KNEE ARTHROPLASTY: SHX125

## 2017-05-29 LAB — APTT: aPTT: 31 seconds (ref 24–36)

## 2017-05-29 LAB — PROTIME-INR
INR: 1.05
Prothrombin Time: 13.7 seconds (ref 11.4–15.2)

## 2017-05-29 SURGERY — ARTHROPLASTY, KNEE, TOTAL
Anesthesia: Spinal | Site: Knee | Laterality: Left

## 2017-05-29 MED ORDER — KETOROLAC TROMETHAMINE 15 MG/ML IJ SOLN
30.0000 mg | Freq: Four times a day (QID) | INTRAMUSCULAR | Status: DC
  Administered 2017-05-29: 30 mg via INTRAVENOUS
  Filled 2017-05-29: qty 2

## 2017-05-29 MED ORDER — LEVOTHYROXINE SODIUM 25 MCG PO TABS
125.0000 ug | ORAL_TABLET | Freq: Every day | ORAL | Status: DC
  Administered 2017-05-30 – 2017-05-31 (×2): 125 ug via ORAL
  Filled 2017-05-29 (×2): qty 1

## 2017-05-29 MED ORDER — OXYCODONE HCL 5 MG PO TABS
5.0000 mg | ORAL_TABLET | ORAL | Status: DC | PRN
  Administered 2017-05-29 (×2): 10 mg via ORAL
  Filled 2017-05-29 (×2): qty 2

## 2017-05-29 MED ORDER — PROPOFOL 500 MG/50ML IV EMUL
INTRAVENOUS | Status: DC | PRN
  Administered 2017-05-29: 50 ug/kg/min via INTRAVENOUS

## 2017-05-29 MED ORDER — MENTHOL 3 MG MT LOZG: 1.0000 | LOZENGE | OROMUCOSAL | Status: DC | PRN

## 2017-05-29 MED ORDER — ACETAMINOPHEN 325 MG PO TABS: 650.0000 mg | ORAL_TABLET | Freq: Four times a day (QID) | ORAL | Status: DC | PRN

## 2017-05-29 MED ORDER — OXYCODONE HCL ER 10 MG PO T12A: 10.0000 mg | EXTENDED_RELEASE_TABLET | Freq: Two times a day (BID) | ORAL | 0 refills | Status: DC

## 2017-05-29 MED ORDER — OXYCODONE HCL 5 MG/5ML PO SOLN: 5.0000 mg | Freq: Once | ORAL | Status: AC | PRN

## 2017-05-29 MED ORDER — FENTANYL CITRATE (PF) 100 MCG/2ML IJ SOLN
50.0000 ug | Freq: Once | INTRAMUSCULAR | Status: AC
  Administered 2017-05-29: 50 ug via INTRAVENOUS

## 2017-05-29 MED ORDER — CHLORHEXIDINE GLUCONATE 4 % EX LIQD: 60.0000 mL | Freq: Once | CUTANEOUS | Status: DC

## 2017-05-29 MED ORDER — FENTANYL CITRATE (PF) 100 MCG/2ML IJ SOLN
INTRAMUSCULAR | Status: AC
  Filled 2017-05-29: qty 2

## 2017-05-29 MED ORDER — MIDAZOLAM HCL 2 MG/2ML IJ SOLN
1.0000 mg | Freq: Once | INTRAMUSCULAR | Status: AC
  Administered 2017-05-29: 1 mg via INTRAVENOUS
  Filled 2017-05-29: qty 1

## 2017-05-29 MED ORDER — METHOCARBAMOL 1000 MG/10ML IJ SOLN
500.0000 mg | Freq: Four times a day (QID) | INTRAMUSCULAR | Status: DC | PRN
  Filled 2017-05-29: qty 5

## 2017-05-29 MED ORDER — FENTANYL CITRATE (PF) 100 MCG/2ML IJ SOLN
INTRAMUSCULAR | Status: DC | PRN
  Administered 2017-05-29: 50 ug via INTRAVENOUS

## 2017-05-29 MED ORDER — LOSARTAN POTASSIUM 25 MG PO TABS
25.0000 mg | ORAL_TABLET | Freq: Every day | ORAL | Status: DC
  Administered 2017-05-29 – 2017-05-31 (×3): 25 mg via ORAL
  Filled 2017-05-29 (×3): qty 1

## 2017-05-29 MED ORDER — OXYCODONE HCL 5 MG PO TABS
5.0000 mg | ORAL_TABLET | Freq: Once | ORAL | Status: AC | PRN
  Administered 2017-05-29: 5 mg via ORAL

## 2017-05-29 MED ORDER — MORPHINE SULFATE (PF) 4 MG/ML IV SOLN: 1.0000 mg | INTRAVENOUS | Status: DC | PRN

## 2017-05-29 MED ORDER — DEXAMETHASONE SODIUM PHOSPHATE 10 MG/ML IJ SOLN
10.0000 mg | Freq: Once | INTRAMUSCULAR | Status: AC
  Administered 2017-05-30: 10 mg via INTRAVENOUS
  Filled 2017-05-29: qty 1

## 2017-05-29 MED ORDER — METOCLOPRAMIDE HCL 5 MG/ML IJ SOLN
5.0000 mg | Freq: Three times a day (TID) | INTRAMUSCULAR | Status: DC | PRN
  Administered 2017-05-30: 10 mg via INTRAVENOUS
  Filled 2017-05-29: qty 2

## 2017-05-29 MED ORDER — 0.9 % SODIUM CHLORIDE (POUR BTL) OPTIME
TOPICAL | Status: DC | PRN
  Administered 2017-05-29: 1000 mL

## 2017-05-29 MED ORDER — KETOROLAC TROMETHAMINE 15 MG/ML IJ SOLN
15.0000 mg | Freq: Four times a day (QID) | INTRAMUSCULAR | Status: AC
  Administered 2017-05-30 (×3): 15 mg via INTRAVENOUS
  Filled 2017-05-29 (×3): qty 1

## 2017-05-29 MED ORDER — SODIUM CHLORIDE 0.9 % IV SOLN
INTRAVENOUS | Status: DC
  Administered 2017-05-29 – 2017-05-31 (×4): via INTRAVENOUS

## 2017-05-29 MED ORDER — ONDANSETRON HCL 4 MG/2ML IJ SOLN
4.0000 mg | Freq: Once | INTRAMUSCULAR | Status: AC | PRN
  Administered 2017-05-29: 4 mg via INTRAVENOUS

## 2017-05-29 MED ORDER — OXYCODONE HCL ER 15 MG PO T12A
15.0000 mg | EXTENDED_RELEASE_TABLET | Freq: Two times a day (BID) | ORAL | Status: DC
  Administered 2017-05-29 – 2017-05-30 (×2): 15 mg via ORAL
  Filled 2017-05-29 (×4): qty 1

## 2017-05-29 MED ORDER — ATENOLOL 50 MG PO TABS
100.0000 mg | ORAL_TABLET | Freq: Every day | ORAL | Status: DC
  Administered 2017-05-30 – 2017-05-31 (×2): 100 mg via ORAL
  Filled 2017-05-29 (×2): qty 2

## 2017-05-29 MED ORDER — TRANEXAMIC ACID 1000 MG/10ML IV SOLN
INTRAVENOUS | Status: AC | PRN
  Administered 2017-05-29: 2000 mg via TOPICAL

## 2017-05-29 MED ORDER — OXYCODONE HCL 5 MG PO TABS: 5.0000 mg | ORAL_TABLET | ORAL | 0 refills | Status: DC | PRN

## 2017-05-29 MED ORDER — ONDANSETRON HCL 4 MG/2ML IJ SOLN
INTRAMUSCULAR | Status: AC
  Filled 2017-05-29: qty 2

## 2017-05-29 MED ORDER — PHENYLEPHRINE 40 MCG/ML (10ML) SYRINGE FOR IV PUSH (FOR BLOOD PRESSURE SUPPORT)
PREFILLED_SYRINGE | INTRAVENOUS | Status: AC
  Filled 2017-05-29: qty 60

## 2017-05-29 MED ORDER — LIDOCAINE 2% (20 MG/ML) 5 ML SYRINGE
INTRAMUSCULAR | Status: AC
  Filled 2017-05-29: qty 5

## 2017-05-29 MED ORDER — ONDANSETRON HCL 4 MG/2ML IJ SOLN
4.0000 mg | Freq: Four times a day (QID) | INTRAMUSCULAR | Status: DC | PRN
  Administered 2017-05-29: 4 mg via INTRAVENOUS
  Filled 2017-05-29: qty 2

## 2017-05-29 MED ORDER — SODIUM CHLORIDE 0.9 % IR SOLN
Status: DC | PRN
  Administered 2017-05-29: 3000 mL

## 2017-05-29 MED ORDER — FENTANYL CITRATE (PF) 100 MCG/2ML IJ SOLN
INTRAMUSCULAR | Status: AC
  Administered 2017-05-29: 50 ug via INTRAVENOUS
  Filled 2017-05-29: qty 2

## 2017-05-29 MED ORDER — OXYCODONE HCL 5 MG PO TABS
ORAL_TABLET | ORAL | Status: AC
  Filled 2017-05-29: qty 1

## 2017-05-29 MED ORDER — PROMETHAZINE HCL 25 MG PO TABS: 25.0000 mg | ORAL_TABLET | Freq: Four times a day (QID) | ORAL | 1 refills | Status: DC | PRN

## 2017-05-29 MED ORDER — FENTANYL CITRATE (PF) 250 MCG/5ML IJ SOLN
INTRAMUSCULAR | Status: AC
  Filled 2017-05-29: qty 5

## 2017-05-29 MED ORDER — CEFAZOLIN SODIUM-DEXTROSE 2-4 GM/100ML-% IV SOLN
2.0000 g | Freq: Four times a day (QID) | INTRAVENOUS | Status: AC
  Administered 2017-05-29 – 2017-05-30 (×2): 2 g via INTRAVENOUS
  Filled 2017-05-29 (×2): qty 100

## 2017-05-29 MED ORDER — MIDAZOLAM HCL 2 MG/2ML IJ SOLN
INTRAMUSCULAR | Status: AC
  Administered 2017-05-29: 1 mg via INTRAVENOUS
  Filled 2017-05-29: qty 2

## 2017-05-29 MED ORDER — FENTANYL CITRATE (PF) 100 MCG/2ML IJ SOLN
25.0000 ug | INTRAMUSCULAR | Status: DC | PRN
  Administered 2017-05-29: 50 ug via INTRAVENOUS

## 2017-05-29 MED ORDER — TRANEXAMIC ACID 1000 MG/10ML IV SOLN
2000.0000 mg | INTRAVENOUS | Status: DC
  Filled 2017-05-29: qty 20

## 2017-05-29 MED ORDER — TRANEXAMIC ACID 1000 MG/10ML IV SOLN
1000.0000 mg | Freq: Once | INTRAVENOUS | Status: AC
  Administered 2017-05-29: 1000 mg via INTRAVENOUS
  Filled 2017-05-29: qty 10

## 2017-05-29 MED ORDER — ACETAMINOPHEN 650 MG RE SUPP: 650.0000 mg | Freq: Four times a day (QID) | RECTAL | Status: DC | PRN

## 2017-05-29 MED ORDER — METHOCARBAMOL 500 MG PO TABS: 500.0000 mg | ORAL_TABLET | Freq: Four times a day (QID) | ORAL | Status: DC | PRN

## 2017-05-29 MED ORDER — ADULT MULTIVITAMIN W/MINERALS CH
1.0000 | ORAL_TABLET | Freq: Every day | ORAL | Status: DC
Start: 2017-05-29 — End: 2017-05-31
  Administered 2017-05-29 – 2017-05-31 (×3): 1 via ORAL
  Filled 2017-05-29 (×3): qty 1

## 2017-05-29 MED ORDER — PRAVASTATIN SODIUM 40 MG PO TABS
80.0000 mg | ORAL_TABLET | Freq: Every day | ORAL | Status: DC
  Administered 2017-05-29 – 2017-05-30 (×2): 80 mg via ORAL
  Filled 2017-05-29 (×3): qty 2

## 2017-05-29 MED ORDER — POLYETHYLENE GLYCOL 3350 17 G PO PACK: 17.0000 g | PACK | Freq: Every day | ORAL | Status: DC | PRN

## 2017-05-29 MED ORDER — FENTANYL CITRATE (PF) 100 MCG/2ML IJ SOLN
50.0000 ug | Freq: Once | INTRAMUSCULAR | Status: AC
  Administered 2017-05-29: 50 ug via INTRAVENOUS
  Filled 2017-05-29: qty 1

## 2017-05-29 MED ORDER — METHOCARBAMOL 750 MG PO TABS: 750.0000 mg | ORAL_TABLET | Freq: Two times a day (BID) | ORAL | 0 refills | Status: DC | PRN

## 2017-05-29 MED ORDER — PHENYLEPHRINE HCL 10 MG/ML IJ SOLN
INTRAMUSCULAR | Status: AC
  Filled 2017-05-29: qty 1

## 2017-05-29 MED ORDER — APIXABAN 5 MG PO TABS
5.0000 mg | ORAL_TABLET | Freq: Two times a day (BID) | ORAL | Status: DC
  Administered 2017-05-30 – 2017-05-31 (×3): 5 mg via ORAL
  Filled 2017-05-29 (×3): qty 1

## 2017-05-29 MED ORDER — LACTATED RINGERS IV SOLN
INTRAVENOUS | Status: DC
  Administered 2017-05-29 (×2): via INTRAVENOUS

## 2017-05-29 MED ORDER — METOCLOPRAMIDE HCL 5 MG PO TABS: 5.0000 mg | ORAL_TABLET | Freq: Three times a day (TID) | ORAL | Status: DC | PRN

## 2017-05-29 MED ORDER — ALUM & MAG HYDROXIDE-SIMETH 200-200-20 MG/5ML PO SUSP: 30.0000 mL | ORAL | Status: DC | PRN

## 2017-05-29 MED ORDER — MAGNESIUM CITRATE PO SOLN: 1.0000 | Freq: Once | ORAL | Status: DC | PRN

## 2017-05-29 MED ORDER — PHENYLEPHRINE HCL 10 MG/ML IJ SOLN
INTRAVENOUS | Status: DC | PRN
  Administered 2017-05-29: 25 ug/min via INTRAVENOUS

## 2017-05-29 MED ORDER — ONDANSETRON HCL 4 MG PO TABS: 4.0000 mg | ORAL_TABLET | Freq: Three times a day (TID) | ORAL | 0 refills | Status: DC | PRN

## 2017-05-29 MED ORDER — AMLODIPINE BESYLATE 10 MG PO TABS
10.0000 mg | ORAL_TABLET | Freq: Every day | ORAL | Status: DC
  Administered 2017-05-30 – 2017-05-31 (×2): 10 mg via ORAL
  Filled 2017-05-29 (×2): qty 1

## 2017-05-29 MED ORDER — FUROSEMIDE 40 MG PO TABS
40.0000 mg | ORAL_TABLET | Freq: Every day | ORAL | Status: DC
  Administered 2017-05-29 – 2017-05-31 (×3): 40 mg via ORAL
  Filled 2017-05-29 (×3): qty 1

## 2017-05-29 MED ORDER — DIPHENHYDRAMINE HCL 12.5 MG/5ML PO ELIX: 25.0000 mg | ORAL_SOLUTION | ORAL | Status: DC | PRN

## 2017-05-29 MED ORDER — MIDAZOLAM HCL 2 MG/2ML IJ SOLN
1.0000 mg | Freq: Once | INTRAMUSCULAR | Status: AC
  Administered 2017-05-29: 1 mg via INTRAVENOUS

## 2017-05-29 MED ORDER — SENNOSIDES-DOCUSATE SODIUM 8.6-50 MG PO TABS: 1.0000 | ORAL_TABLET | Freq: Every evening | ORAL | 1 refills | Status: DC | PRN

## 2017-05-29 MED ORDER — ACETAMINOPHEN 500 MG PO TABS
1000.0000 mg | ORAL_TABLET | Freq: Four times a day (QID) | ORAL | Status: AC
  Administered 2017-05-29 – 2017-05-30 (×3): 1000 mg via ORAL
  Filled 2017-05-29 (×3): qty 2

## 2017-05-29 MED ORDER — ONDANSETRON HCL 4 MG PO TABS: 4.0000 mg | ORAL_TABLET | Freq: Four times a day (QID) | ORAL | Status: DC | PRN

## 2017-05-29 MED ORDER — SORBITOL 70 % SOLN: 30.0000 mL | Freq: Every day | Status: DC | PRN

## 2017-05-29 MED ORDER — PHENOL 1.4 % MT LIQD: 1.0000 | OROMUCOSAL | Status: DC | PRN

## 2017-05-29 MED ORDER — FENTANYL CITRATE (PF) 100 MCG/2ML IJ SOLN: 25.0000 ug | INTRAMUSCULAR | Status: DC | PRN

## 2017-05-29 SURGICAL SUPPLY — 69 items
ALCOHOL ISOPROPYL (RUBBING) (MISCELLANEOUS) ×2 IMPLANT
BAG DECANTER FOR FLEXI CONT (MISCELLANEOUS) ×2 IMPLANT
BANDAGE ACE 6X5 VEL STRL LF (GAUZE/BANDAGES/DRESSINGS) ×2 IMPLANT
BANDAGE ESMARK 6X9 LF (GAUZE/BANDAGES/DRESSINGS) ×1 IMPLANT
BENZOIN TINCTURE PRP APPL 2/3 (GAUZE/BANDAGES/DRESSINGS) ×2 IMPLANT
BLADE SAW SGTL 13.0X1.19X90.0M (BLADE) ×2 IMPLANT
BNDG ESMARK 6X9 LF (GAUZE/BANDAGES/DRESSINGS) ×2
BONE CEMENT PALACOS R-G (Cement) ×4 IMPLANT
BOWL SMART MIX CTS (DISPOSABLE) ×2 IMPLANT
CAPT KNEE TOTAL 3 ×2 IMPLANT
CEMENT BONE PALACOS R-G (Cement) ×2 IMPLANT
CLSR STERI-STRIP ANTIMIC 1/2X4 (GAUZE/BANDAGES/DRESSINGS) ×4 IMPLANT
COVER SURGICAL LIGHT HANDLE (MISCELLANEOUS) ×2 IMPLANT
CUFF TOURNIQUET SINGLE 34IN LL (TOURNIQUET CUFF) ×2 IMPLANT
CUFF TOURNIQUET SINGLE 44IN (TOURNIQUET CUFF) IMPLANT
DRAPE EXTREMITY ABCS (DRAPES) ×2 IMPLANT
DRAPE EXTREMITY T 121X128X90 (DRAPE) ×2 IMPLANT
DRAPE HALF SHEET 40X57 (DRAPES) ×2 IMPLANT
DRAPE INCISE IOBAN 66X45 STRL (DRAPES) ×2 IMPLANT
DRAPE ORTHO SPLIT 77X108 STRL (DRAPES) ×2
DRAPE SURG 17X11 SM STRL (DRAPES) ×4 IMPLANT
DRAPE SURG ORHT 6 SPLT 77X108 (DRAPES) ×2 IMPLANT
DRSG AQUACEL AG ADV 3.5X10 (GAUZE/BANDAGES/DRESSINGS) ×2 IMPLANT
DRSG AQUACEL AG ADV 3.5X14 (GAUZE/BANDAGES/DRESSINGS) ×2 IMPLANT
DURAPREP 26ML APPLICATOR (WOUND CARE) ×4 IMPLANT
ELECT CAUTERY BLADE 6.4 (BLADE) ×2 IMPLANT
ELECT REM PT RETURN 9FT ADLT (ELECTROSURGICAL) ×2
ELECTRODE REM PT RTRN 9FT ADLT (ELECTROSURGICAL) ×1 IMPLANT
GLOVE BIOGEL PI IND STRL 6.5 (GLOVE) ×2 IMPLANT
GLOVE BIOGEL PI IND STRL 7.0 (GLOVE) ×2 IMPLANT
GLOVE BIOGEL PI INDICATOR 6.5 (GLOVE) ×2
GLOVE BIOGEL PI INDICATOR 7.0 (GLOVE) ×2
GLOVE SKINSENSE NS SZ7.5 (GLOVE) ×1
GLOVE SKINSENSE STRL SZ7.5 (GLOVE) ×1 IMPLANT
GLOVE SURG SS PI 7.0 STRL IVOR (GLOVE) ×2 IMPLANT
GLOVE SURG SYN 7.5  E (GLOVE) ×4
GLOVE SURG SYN 7.5 E (GLOVE) ×4 IMPLANT
GOWN STRL REIN XL XLG (GOWN DISPOSABLE) ×2 IMPLANT
GOWN STRL REUS W/ TWL LRG LVL3 (GOWN DISPOSABLE) ×1 IMPLANT
GOWN STRL REUS W/TWL LRG LVL3 (GOWN DISPOSABLE) ×1
HANDPIECE INTERPULSE COAX TIP (DISPOSABLE) ×1
HOOD PEEL AWAY FLYTE STAYCOOL (MISCELLANEOUS) ×4 IMPLANT
KIT BASIN OR (CUSTOM PROCEDURE TRAY) ×2 IMPLANT
KIT ROOM TURNOVER OR (KITS) ×2 IMPLANT
MANIFOLD NEPTUNE II (INSTRUMENTS) ×2 IMPLANT
MARKER SKIN DUAL TIP RULER LAB (MISCELLANEOUS) ×2 IMPLANT
NEEDLE SPNL 18GX3.5 QUINCKE PK (NEEDLE) ×2 IMPLANT
NS IRRIG 1000ML POUR BTL (IV SOLUTION) ×2 IMPLANT
PACK TOTAL JOINT (CUSTOM PROCEDURE TRAY) ×2 IMPLANT
PAD ARMBOARD 7.5X6 YLW CONV (MISCELLANEOUS) ×4 IMPLANT
SAW OSC TIP CART 19.5X105X1.3 (SAW) ×2 IMPLANT
SET HNDPC FAN SPRY TIP SCT (DISPOSABLE) ×1 IMPLANT
STAPLER VISISTAT 35W (STAPLE) IMPLANT
STRIP CLOSURE SKIN 1/2X4 (GAUZE/BANDAGES/DRESSINGS) ×2 IMPLANT
SUCTION FRAZIER HANDLE 10FR (MISCELLANEOUS) ×1
SUCTION TUBE FRAZIER 10FR DISP (MISCELLANEOUS) ×1 IMPLANT
SUT ETHILON 2 0 FS 18 (SUTURE) IMPLANT
SUT MNCRL AB 4-0 PS2 18 (SUTURE) IMPLANT
SUT VIC AB 0 CT1 27 (SUTURE) ×2
SUT VIC AB 0 CT1 27XBRD ANBCTR (SUTURE) ×2 IMPLANT
SUT VIC AB 1 CTX 27 (SUTURE) ×6 IMPLANT
SUT VIC AB 2-0 CT1 27 (SUTURE) ×3
SUT VIC AB 2-0 CT1 TAPERPNT 27 (SUTURE) ×3 IMPLANT
SYR 50ML LL SCALE MARK (SYRINGE) ×2 IMPLANT
TOWEL OR 17X24 6PK STRL BLUE (TOWEL DISPOSABLE) ×2 IMPLANT
TOWEL OR 17X26 10 PK STRL BLUE (TOWEL DISPOSABLE) ×2 IMPLANT
TRAY CATH 16FR W/PLASTIC CATH (SET/KITS/TRAYS/PACK) IMPLANT
UNDERPAD 30X30 (UNDERPADS AND DIAPERS) ×2 IMPLANT
WRAP KNEE MAXI GEL POST OP (GAUZE/BANDAGES/DRESSINGS) ×2 IMPLANT

## 2017-05-29 NOTE — Anesthesia Preprocedure Evaluation (Addendum)
Anesthesia Evaluation  Patient identified by MRN, date of birth, ID band Patient awake    Reviewed: Allergy & Precautions, NPO status , Patient's Chart, lab work & pertinent test results, reviewed documented beta blocker date and time   History of Anesthesia Complications Negative for: history of anesthetic complications  Airway Mallampati: II  TM Distance: >3 FB Neck ROM: Full    Dental  (+) Teeth Intact   Pulmonary neg pulmonary ROS,    breath sounds clear to auscultation       Cardiovascular hypertension, Pt. on medications and Pt. on home beta blockers +CHF  + dysrhythmias Atrial Fibrillation  Rhythm:Irregular     Neuro/Psych negative neurological ROS     GI/Hepatic negative GI ROS, Neg liver ROS,   Endo/Other  neg diabetesHypothyroidism Morbid obesity  Renal/GU Renal InsufficiencyRenal disease     Musculoskeletal  (+) Arthritis ,   Abdominal   Peds  Hematology   Anesthesia Other Findings   Reproductive/Obstetrics                            Anesthesia Physical Anesthesia Plan  ASA: III  Anesthesia Plan: MAC, Regional and Spinal   Post-op Pain Management:    Induction:   PONV Risk Score and Plan: 2 and Ondansetron  Airway Management Planned: Nasal Cannula, Natural Airway and Simple Face Mask  Additional Equipment: None  Intra-op Plan:   Post-operative Plan:   Informed Consent: I have reviewed the patients History and Physical, chart, labs and discussed the procedure including the risks, benefits and alternatives for the proposed anesthesia with the patient or authorized representative who has indicated his/her understanding and acceptance.   Dental advisory given  Plan Discussed with: CRNA and Surgeon  Anesthesia Plan Comments:         Anesthesia Quick Evaluation

## 2017-05-29 NOTE — Transfer of Care (Signed)
Immediate Anesthesia Transfer of Care Note  Patient: [name]  Procedure(s) Performed: Procedure(s): LEFT TOTAL KNEE ARTHROPLASTY (Left)  Patient Location: PACU  Anesthesia Type:MAC, Regional and Spinal  Level of Consciousness: awake, alert , oriented and patient cooperative  Airway & Oxygen Therapy: Patient Spontanous Breathing and Patient connected to nasal cannula oxygen  Post-op Assessment: Report given to RN and Post -op Vital signs reviewed and stable  Post vital signs: Reviewed and stable  Last Vitals:  Vitals:   05/29/17 1030 05/29/17 1215  BP: 137/90 (!) 165/102  Pulse: 93 (!) 35  Resp: 10 14  Temp: 36.6 C     Last Pain:  Vitals:   05/29/17 1030  TempSrc: Oral         Complications: No apparent anesthesia complications

## 2017-05-29 NOTE — Anesthesia Procedure Notes (Signed)
Anesthesia Regional Block: Adductor canal block   Pre-Anesthetic Checklist: ,, timeout performed, Correct Patient, Correct Site, Correct Laterality, Correct Procedure, Correct Position, site marked, Risks and benefits discussed,  Surgical consent,  Pre-op evaluation,  At surgeon's request and post-op pain management  Laterality: Lower and Left  Prep: chloraprep       Needles:  Injection technique: Single-shot  Needle Type: Echogenic Stimulator Needle          Additional Needles:   Procedures: ultrasound guided,,,,,,,,  Narrative:  Start time: 05/29/2017 12:09 PM End time: 05/29/2017 12:15 PM Injection made incrementally with aspirations every 5 mL.  Performed by: Personally  Anesthesiologist: Sharmaine Bain  Additional Notes: H+P and labs reviewed, risks and benefits discussed with patient, procedure tolerated well without complications

## 2017-05-29 NOTE — Progress Notes (Signed)
Orthopedic Tech Progress Note Patient Details:  [name] Jul 31, 1946 [id number]  CPM Left Knee CPM Left Knee: On Left Knee Flexion (Degrees): 90 Left Knee Extension (Degrees): 0   Braulio Bosch 05/29/2017, 3:30 PM

## 2017-05-29 NOTE — H&P (Signed)
PREOPERATIVE H&P  Chief Complaint: left knee degenerative joint disease  HPI: [name] is a 71 y.o. female who presents for surgical treatment of left knee degenerative joint disease.  She denies any changes in medical history.  Past Medical History:  Diagnosis Date  . Arthritis   . Dysrhythmia    new onset afib   . High cholesterol   . Hyperlipidemia   . Hypertension   . Hypothyroidism    Past Surgical History:  Procedure Laterality Date  . BACK SURGERY  2012  . CARDIOVASCULAR STRESS TEST    . COLONOSCOPY    . TUBAL LIGATION  1977  . US ECHOCARDIOGRAPHY     Social History   Social History  . Marital status: Married    Spouse name: N/A  . Number of children: N/A  . Years of education: N/A   Social History Main Topics  . Smoking status: Never Smoker  . Smokeless tobacco: Never Used  . Alcohol use No  . Drug use: No  . Sexual activity: Not on file   Other Topics Concern  . Not on file   Social History Narrative  . No narrative on file   Family History  Problem Relation Age of Onset  . Colon cancer Neg Hx   . Stomach cancer Neg Hx    Allergies  Allergen Reactions  . No Known Allergies    Prior to Admission medications   Medication Sig Start Date End Date Taking? Authorizing Provider  acetaminophen (TYLENOL) 325 MG tablet Take 650 mg by mouth every 6 (six) hours as needed (for pain.).   Yes [provider]  amLODipine (NORVASC) 10 MG tablet Take 10 mg by mouth daily at 3 pm.  06/19/12  Yes [provider]  apixaban (ELIQUIS) 5 MG TABS tablet Take 1 tablet (5 mg total) by mouth 2 (two) times daily. 04/16/17  Yes Adrian Prows, MD  Aromatic Inhalants (VICKS VAPOR INHALER IN) Inhale 1 application into the lungs daily as needed (nasal congestion).   Yes [provider]  atenolol (TENORMIN) 100 MG tablet Take 100 mg by mouth daily.  06/19/12  Yes [provider]  Calcium Carbonate-Vit D-Min (CALCIUM 600+D PLUS MINERALS PO)  Take 1 tablet by mouth daily at 3 pm.    Yes [provider]  furosemide (LASIX) 40 MG tablet Take 40 mg by mouth daily.  06/19/12  Yes [provider]  levothyroxine (SYNTHROID, LEVOTHROID) 125 MCG tablet Take 125 mcg by mouth daily.  06/19/12  Yes [provider]  losartan (COZAAR) 25 MG tablet Take 25 mg by mouth daily.  03/25/17  Yes [provider]  Multiple Vitamin (MULTIVITAMIN) tablet Take 1 tablet by mouth daily.   Yes [provider]  pravastatin (PRAVACHOL) 80 MG tablet Take 80 mg by mouth daily at 3 pm.  07/16/12  Yes [provider]     Positive ROS: All other systems have been reviewed and were otherwise negative with the exception of those mentioned in the HPI and as above.  Physical Exam: General: Alert, no acute distress Cardiovascular: No pedal edema Respiratory: No cyanosis, no use of accessory musculature GI: abdomen soft Skin: No lesions in the area of chief complaint Neurologic: Sensation intact distally Psychiatric: Patient is competent for consent with normal mood and affect Lymphatic: no lymphedema  MUSCULOSKELETAL: exam stable  Assessment: left knee degenerative joint disease  Plan: Plan for Procedure(s): LEFT TOTAL KNEE ARTHROPLASTY  The risks benefits and alternatives were  discussed with the patient including but not limited to the risks of nonoperative treatment, versus surgical intervention including infection, bleeding, nerve injury,  blood clots, cardiopulmonary complications, morbidity, mortality, among others, and they were willing to proceed.   Eduard Roux, MD   05/29/2017 9:05 AM

## 2017-05-29 NOTE — Anesthesia Procedure Notes (Signed)
Spinal  Patient location during procedure: OR Start time: 05/29/2017 12:28 PM End time: 05/29/2017 12:32 PM Staffing Anesthesiologist: Oleta Mouse Preanesthetic Checklist Completed: patient identified, surgical consent, pre-op evaluation, timeout performed, IV checked, risks and benefits discussed and monitors and equipment checked Spinal Block Patient position: sitting Prep: site prepped and draped and DuraPrep Patient monitoring: heart rate, cardiac monitor, continuous pulse ox and blood pressure Approach: midline Location: L3-4 Injection technique: single-shot Needle Needle type: Pencan  Needle gauge: 24 G Needle length: 10 cm Assessment Sensory level: T6

## 2017-05-29 NOTE — Op Note (Signed)
Total Knee Arthroplasty Procedure Note  Preoperative diagnosis: Left knee osteoarthritis  Postoperative diagnosis:same  Operative procedure: Left total knee arthroplasty. CPT [id number]  Surgeon: N. Eduard Roux, MD  Assistants: Laure Kidney  Anesthesia: Spinal, regional  Tourniquet time: 86 mins  Implants used: [employer] Femur: PS 4 Tibia: 3 Patella: 32 mm, 7.5 thick Polyethylene: 11 mm  Indication: [name] is a 71 y.o. year old female with a history of knee pain. Having failed conservative management, the patient elected to proceed with a total knee arthroplasty.  We have reviewed the risk and benefits of the surgery and they elected to proceed after voicing understanding.  Procedure:  After informed consent was obtained and understanding of the risk were voiced including but not limited to bleeding, infection, damage to surrounding structures including nerves and vessels, blood clots, leg length inequality and the failure to achieve desired results, the operative extremity was marked with verbal confirmation of the patient in the holding area.   The patient was then brought to the operating room and transported to the operating room table in the supine position.  A tourniquet was applied to the operative extremity around the upper thigh. The operative limb was then prepped and draped in the usual sterile fashion and preoperative antibiotics were administered.  A time out was performed prior to the start of surgery confirming the correct extremity, preoperative antibiotic administration, as well as team members, implants and instruments available for the case. Correct surgical site was also confirmed with preoperative radiographs. The limb was then elevated for exsanguination and the tourniquet was inflated. A midline incision was made and a standard medial parapatellar approach was performed.  The patella was prepared and sized to a 32 mm.  A cover was placed on the  patella for protection from retractors.  We then turned our attention to the femur. Posterior cruciate ligament was sacrificed. Start site was drilled in the femur and the intramedullary distal femoral cutting guide was placed, set at 5 degrees valgus, taking 11 mm of distal resection. The distal cut was made. Osteophytes were then removed. Next, the proximal tibial cutting guide was placed with appropriate slope, varus/valgus alignment and depth of resection. The proximal tibial cut was made. Gap blocks were then used to assess the extension gap and alignment, and appropriate soft tissue releases were performed. Attention was turned back to the femur, which was sized using the sizing guide to a size 4. Appropriate rotation of the femoral component was determined using epicondylar axis, Whiteside's line, and assessing the flexion gap under ligament tension. The appropriate size 4-in-1 cutting block was placed and cuts were made. Posterior femoral osteophytes and uncapped bone were then removed with the curved osteotome. The tibia was sized for a size 3 component. The femoral box-cutting guide was placed and prepared for a PS femoral component. Trial components were placed, and stability was checked in full extension, mid-flexion, and deep flexion. Proper tibial rotation was determined and marked.  The patella tracked well without a lateral release. Trial components were then removed and tibial preparation performed. A posterior capsular injection comprising of 20 cc of 1.3% exparel and 40 cc of normal saline was performed for postoperative pain control.  The popliteus tendon was released in order to balance the flexion space. The bony surfaces were irrigated with a pulse lavage and then dried. Bone cement was vacuum mixed on the back table, and the final components sized above were cemented into place. After cement had  finished curing, excess cement was removed. The stability of the construct was re-evaluated  throughout a range of motion and found to be acceptable. The trial liner was removed, the knee was copiously irrigated, and the knee was re-evaluated for any excess bone debris. The real polyethylene liner, 11 mm thick, was inserted and checked to ensure the locking mechanism had engaged appropriately. The tourniquet was deflated and hemostasis was achieved. The wound was irrigated with dilute betadine in normal saline, and then again with normal saline. A drain was not placed. Capsular closure was performed with a #1 vicryl, subcutaneous fat closed with a 0 vicryl suture, then subcutaneous tissue closed with interrupted 2.0 vicryl suture. The skin was then closed with a 3.0 monocryl. A sterile dressing was applied.   The patient was awakened in the operating room and taken to recovery in stable condition. All sponge, needle, and instrument counts were correct at the end of the case.  Position: supine  Complications: none.  Time Out: performed   Drains/Packing: none  Estimated blood loss: 50 cc  Returned to Recovery Room: in good condition.   Antibiotics: yes   Mechanical VTE (DVT) Prophylaxis: sequential compression devices, TED thigh-high  Chemical VTE (DVT) Prophylaxis: eliquis  Fluid Replacement  Crystalloid: see anesthesia record Blood: none  FFP: none   Specimens Removed: 1 to pathology   Sponge and Instrument Count Correct? yes   PACU: portable radiograph - knee AP and Lateral   Admission: inpatient status  Plan/RTC: Return in 2 weeks for wound check.   Weight Bearing/Load Lower Extremity: full   N. Eduard Roux, MD [facility] [phone] 2:28 PM

## 2017-05-30 ENCOUNTER — Encounter (HOSPITAL_COMMUNITY): Payer: Self-pay | Admitting: Orthopaedic Surgery

## 2017-05-30 LAB — BASIC METABOLIC PANEL
Anion gap: 8 (ref 5–15)
BUN: 16 mg/dL (ref 6–20)
CO2: 25 mmol/L (ref 22–32)
Calcium: 8.6 mg/dL — ABNORMAL LOW (ref 8.9–10.3)
Chloride: 104 mmol/L (ref 101–111)
Creatinine, Ser: 1.36 mg/dL — ABNORMAL HIGH (ref 0.44–1.00)
GFR calc Af Amer: 44 mL/min — ABNORMAL LOW (ref >60)
GFR calc non Af Amer: 38 mL/min — ABNORMAL LOW (ref >60)
Glucose, Bld: 148 mg/dL — ABNORMAL HIGH (ref 65–99)
Potassium: 4.9 mmol/L (ref 3.5–5.1)
Sodium: 137 mmol/L (ref 135–145)

## 2017-05-30 LAB — CBC
HCT: 40.1 % (ref 36.0–46.0)
Hemoglobin: 12.6 g/dL (ref 12.0–15.0)
MCH: 26.5 pg (ref 26.0–34.0)
MCHC: 31.4 g/dL (ref 30.0–36.0)
MCV: 84.2 fL (ref 78.0–100.0)
Platelets: 233 10*3/uL (ref 150–400)
RBC: 4.76 MIL/uL (ref 3.87–5.11)
RDW: 14.4 % (ref 11.5–15.5)
WBC: 11.1 10*3/uL — ABNORMAL HIGH (ref 4.0–10.5)

## 2017-05-30 NOTE — Progress Notes (Signed)
   Subjective:  Patient reports pain as mild.  Walked around entire floor this morning.  Objective:   VITALS:   Vitals:   05/29/17 2041 05/30/17 0026 05/30/17 0616 05/30/17 0919  BP: 105/65 113/63 97/70   Pulse: 66 64 (!) 108 (!) 110  Resp: 18 18 18    Temp: 97.7 F (36.5 C) 97.6 F (36.4 C) 97.5 F (36.4 C)   TempSrc: Oral Oral Oral   SpO2: 99% 98% 100% 99%    Neurologically intact Neurovascular intact Sensation intact distally Intact pulses distally Dorsiflexion/Plantar flexion intact Incision: dressing C/D/I and no drainage No cellulitis present Compartment soft   Lab Results  Component Value Date   WBC 11.1 (H) 05/30/2017   HGB 12.6 05/30/2017   HCT 40.1 05/30/2017   MCV 84.2 05/30/2017   PLT 233 05/30/2017     Assessment/Plan:  1 Day Post-Op   - Expected postop acute blood loss anemia - will monitor for symptoms - Up with PT/OT - DVT ppx - SCDs, ambulation, eliquis - WBAT operative extremity - Pain control - Discharge planning - home sat  Eduard Roux 05/30/2017, 2:01 PM [phone]

## 2017-05-30 NOTE — Discharge Instructions (Signed)
Information on my medicine - ELIQUIS (apixaban)  This medication education was reviewed with me or my healthcare representative as part of my discharge preparation.  The pharmacist that spoke with me during my hospital stay was:  Jaquita Folds, [facility]  Why was Eliquis prescribed for you? Eliquis was prescribed for you to reduce the risk of a blood clot forming that can cause a stroke if you have a medical condition called atrial fibrillation (a type of irregular heartbeat).  What do You need to know about Eliquis ? Take your Eliquis TWICE DAILY - one tablet in the morning and one tablet in the evening with or without food. If you have difficulty swallowing the tablet whole please discuss with your pharmacist how to take the medication safely.  Take Eliquis exactly as prescribed by your doctor and DO NOT stop taking Eliquis without talking to the doctor who prescribed the medication.  Stopping may increase your risk of developing a stroke.  Refill your prescription before you run out.  After discharge, you should have regular check-up appointments with your healthcare provider that is prescribing your Eliquis.  In the future your dose may need to be changed if your kidney function or weight changes by a significant amount or as you get older.  What do you do if you miss a dose? If you miss a dose, take it as soon as you remember on the same day and resume taking twice daily.  Do not take more than one dose of ELIQUIS at the same time to make up a missed dose.  Important Safety Information A possible side effect of Eliquis is bleeding. You should call your healthcare provider right away if you experience any of the following: ? Bleeding from an injury or your nose that does not stop. ? Unusual colored urine (red or dark brown) or unusual colored stools (red or black). ? Unusual bruising for unknown reasons. ? A serious fall or if you hit your head (even if there is no bleeding).  Some  medicines may interact with Eliquis and might increase your risk of bleeding or clotting while on Eliquis. To help avoid this, consult your healthcare provider or pharmacist prior to using any new prescription or non-prescription medications, including herbals, vitamins, non-steroidal anti-inflammatory drugs (NSAIDs) and supplements.  This website has more information on Eliquis (apixaban): http://www.eliquis.com/eliquis/home  INSTRUCTIONS AFTER JOINT REPLACEMENT   o Remove items at home which could result in a fall. This includes throw rugs or furniture in walking pathways o ICE to the affected joint every three hours while awake for 30 minutes at a time, for at least the first 3-5 days, and then as needed for pain and swelling.  Continue to use ice for pain and swelling. You may notice swelling that will progress down to the foot and ankle.  This is normal after surgery.  Elevate your leg when you are not up walking on it.   o Continue to use the breathing machine you got in the hospital (incentive spirometer) which will help keep your temperature down.  It is common for your temperature to cycle up and down following surgery, especially at night when you are not up moving around and exerting yourself.  The breathing machine keeps your lungs expanded and your temperature down.   DIET:  As you were doing prior to hospitalization, we recommend a well-balanced diet.  DRESSING / WOUND CARE / SHOWERING  You may change your surgical dressing 7 days after surgery.  Then change the dressing every day with sterile gauze.  Please use good hand washing techniques before changing the dressing.  Do not use any lotions or creams on the incision until instructed by your surgeon.  You may shower while you have the surgical dressing which is waterproof.  After removal of surgical dressing, you must cover the incision when showering.  ACTIVITY  o Increase activity slowly as tolerated, but follow the weight  bearing instructions below.   o No driving for 6 weeks or until further direction given by your physician.  You cannot drive while taking narcotics.  o No lifting or carrying greater than 10 lbs. until further directed by your surgeon. o Avoid periods of inactivity such as sitting longer than an hour when not asleep. This helps prevent blood clots.  o You may return to work once you are authorized by your doctor.     WEIGHT BEARING   Weight bearing as tolerated with assist device (walker, cane, etc) as directed, use it as long as suggested by your surgeon or therapist, typically at least 4-6 weeks.   EXERCISES  Results after joint replacement surgery are often greatly improved when you follow the exercise, range of motion and muscle strengthening exercises prescribed by your doctor. Safety measures are also important to protect the joint from further injury. Any time any of these exercises cause you to have increased pain or swelling, decrease what you are doing until you are comfortable again and then slowly increase them. If you have problems or questions, call your caregiver or physical therapist for advice.   Rehabilitation is important following a joint replacement. After just a few days of immobilization, the muscles of the leg can become weakened and shrink (atrophy).  These exercises are designed to build up the tone and strength of the thigh and leg muscles and to improve motion. Often times heat used for twenty to thirty minutes before working out will loosen up your tissues and help with improving the range of motion but do not use heat for the first two weeks following surgery (sometimes heat can increase post-operative swelling).   These exercises can be done on a training (exercise) mat, on the floor, on a table or on a bed. Use whatever works the best and is most comfortable for you.    Use music or television while you are exercising so that the exercises are a pleasant break in  your day. This will make your life better with the exercises acting as a break in your routine that you can look forward to.   Perform all exercises about fifteen times, three times per day or as directed.  You should exercise both the operative leg and the other leg as well.  Exercises include:    Quad Sets - Tighten up the muscle on the front of the thigh (Quad) and hold for 5-10 seconds.    Straight Leg Raises - With your knee straight (if you were given a brace, keep it on), lift the leg to 60 degrees, hold for 3 seconds, and slowly lower the leg.  Perform this exercise against resistance later as your leg gets stronger.   Leg Slides: Lying on your back, slowly slide your foot toward your buttocks, bending your knee up off the floor (only go as far as is comfortable). Then slowly slide your foot back down until your leg is flat on the floor again.   Angel Wings: Lying on your back spread your legs to  the side as far apart as you can without causing discomfort.   Hamstring Strength:  Lying on your back, push your heel against the floor with your leg straight by tightening up the muscles of your buttocks.  Repeat, but this time bend your knee to a comfortable angle, and push your heel against the floor.  You may put a pillow under the heel to make it more comfortable if necessary.   A rehabilitation program following joint replacement surgery can speed recovery and prevent re-injury in the future due to weakened muscles. Contact your doctor or a physical therapist for more information on knee rehabilitation.    CONSTIPATION  Constipation is defined medically as fewer than three stools per week and severe constipation as less than one stool per week.  Even if you have a regular bowel pattern at home, your normal regimen is likely to be disrupted due to multiple reasons following surgery.  Combination of anesthesia, postoperative narcotics, change in appetite and fluid intake all can affect your  bowels.   YOU MUST use at least one of the following options; they are listed in order of increasing strength to get the job done.  They are all available over the counter, and you may need to use some, POSSIBLY even all of these options:    Drink plenty of fluids (prune juice may be helpful) and high fiber foods Colace 100 mg by mouth twice a day  Senokot for constipation as directed and as needed Dulcolax (bisacodyl), take with full glass of water  Miralax (polyethylene glycol) once or twice a day as needed.  If you have tried all these things and are unable to have a bowel movement in the first 3-4 days after surgery call either your surgeon or your primary doctor.    If you experience loose stools or diarrhea, hold the medications until you stool forms back up.  If your symptoms do not get better within 1 week or if they get worse, check with your doctor.  If you experience "the worst abdominal pain ever" or develop nausea or vomiting, please contact the office immediately for further recommendations for treatment.   ITCHING:  If you experience itching with your medications, try taking only a single pain pill, or even half a pain pill at a time.  You can also use Benadryl over the counter for itching or also to help with sleep.   TED HOSE STOCKINGS:  Use stockings on both legs until for at least 2 weeks or as directed by physician office. They may be removed at night for sleeping.  MEDICATIONS:  See your medication summary on the After Visit Summary that nursing will review with you.  You may have some home medications which will be placed on hold until you complete the course of blood thinner medication.  It is important for you to complete the blood thinner medication as prescribed.  PRECAUTIONS:  If you experience chest pain or shortness of breath - call 911 immediately for transfer to the hospital emergency department.   If you develop a fever greater that 101 F, purulent drainage  from wound, increased redness or drainage from wound, foul odor from the wound/dressing, or calf pain - CONTACT YOUR SURGEON.  FOLLOW-UP APPOINTMENTS:  If you do not already have a post-op appointment, please call the office for an appointment to be seen by your surgeon.  Guidelines for how soon to be seen are listed in your After Visit Summary, but are typically between 1-4 weeks after surgery.  OTHER INSTRUCTIONS:   Knee Replacement:  Do not place pillow under knee, focus on keeping the knee straight while resting. CPM instructions: 0-90 degrees, 2 hours in the morning, 2 hours in the afternoon, and 2 hours in the evening. Place foam block, curve side up under heel at all times except when in CPM or when walking.  DO NOT modify, tear, cut, or change the foam block in any way.  MAKE SURE YOU:   Understand these instructions.   Get help right away if you are not doing well or get worse.    Thank you for letting us be a part of your medical care team.  It is a privilege we respect greatly.  We hope these instructions will help you stay on track for a fast and full recovery!

## 2017-05-30 NOTE — Evaluation (Signed)
Occupational Therapy Evaluation and Discharge Patient Details Name: [name] MRN: [id number] DOB: 04-Feb-1946 Today's Date: 05/30/2017    History of Present Illness 71 yo admitted for Left TKA. PMhx: HLD, HTN, back sx   Clinical Impression   This 71 yo female admitted and now s/p above presents to acute OT with all education completed, we will D/C from acute OT.    Follow Up Recommendations  No OT follow up;Supervision/Assistance - 24 hour    Equipment Recommendations  None recommended by OT       Precautions / Restrictions Precautions Precautions: Fall;Knee Restrictions Weight Bearing Restrictions: No LLE Weight Bearing: Weight bearing as tolerated             ADL either performed or assessed with clinical judgement   ADL Overall ADL's : Needs assistance/impaired Eating/Feeding: Independent;Sitting   Grooming: Set up;Sitting   Upper Body Bathing: Set up;Sitting   Lower Body Bathing: Minimal assistance (with min guard A sit<>stand)   Upper Body Dressing : Set up;Sitting   Lower Body Dressing: Minimal assistance Lower Body Dressing Details (indicate cue type and reason): min guard A sit<>stand               General ADL Comments: husband was A'ing with some LBADLs pta; educatd pt and family on most efficient sequence of getting dressed and the sequence of stepping into and out of walk in shower     Vision Patient Visual Report: No change from baseline              Pertinent Vitals/Pain Pain Assessment: No/denies pain     Hand Dominance Right   Extremity/Trunk Assessment Upper Extremity Assessment Upper Extremity Assessment: Overall WFL for tasks assessed     Communication Communication Communication: No difficulties   Cognition Arousal/Alertness: Awake/alert Behavior During Therapy: WFL for tasks assessed/performed Overall Cognitive Status: Within Functional Limits for tasks assessed                                                Home Living Family/patient expects to be discharged to:: Private residence Living Arrangements: Spouse/significant other Available Help at Discharge: Family;Available 24 hours/day Type of Home: House Home Access: Level entry     Home Layout: Two level;Able to live on main level with bedroom/bathroom Alternate Level Stairs-Number of Steps: 1 4" step in kitchen   Bathroom Shower/Tub: Occupational psychologist: Standard     Home Equipment: Environmental consultant - 2 wheels;Bedside commode;Wheelchair - Brewing technologist;Other (comment);Grab bars - tub/shower;Hand held shower head (lift chair, adjustable bed)          Prior Functioning/Environment Level of Independence: Independent        Comments: spouse does the homemaking and he did A with some LBADLs pta prn                 OT Goals(Current goals can be found in the care plan section) Acute Rehab OT Goals Patient Stated Goal: return to walking and getting out more  OT Frequency:                AM-PAC PT "6 Clicks" Daily Activity     Outcome Measure Help from another person eating meals?: None Help from another person taking care of personal grooming?: None Help from another person toileting, which includes using toliet, bedpan, or urinal?: A Little Help  from another person bathing (including washing, rinsing, drying)?: A Little Help from another person to put on and taking off regular upper body clothing?: None Help from another person to put on and taking off regular lower body clothing?: A Little 6 Click Score: 21   End of Session  Nurse Communication:  (Pt's IV is leaking)  Activity Tolerance: Patient tolerated treatment well Patient left: in chair;with call bell/phone within reach;with chair alarm set;with family/visitor present                   Time: 0254-8628 OT Time Calculation (min): 16 min Charges:  OT General Charges $OT Visit: 1 Procedure OT Evaluation $OT Eval Low Complexity: 1  Procedure [place], OTR/L [phone] 05/30/2017

## 2017-05-30 NOTE — Evaluation (Addendum)
Physical Therapy Evaluation Patient Details Name: [name] MRN: [id number] DOB: 1945-12-27 Today's Date: 05/30/2017   History of Present Illness  71 yo admitted for Left TKA. PMhx: HLD, HTN, back sx  Clinical Impression  Pt pleasant, moving well and eager to ambulate. Pt educated for HEP, CPM use, positioning, gait and progression. Pt with decreased activity tolerance, strength and gait who will benefit from acute therapy to maximize function, independence and gait to decrease burden of care.    Follow Up Recommendations DC plan and follow up therapy as arranged by surgeon    Equipment Recommendations  None recommended by PT    Recommendations for Other Services OT consult     Precautions / Restrictions Precautions Precautions: Fall;Knee Restrictions LLE Weight Bearing: Weight bearing as tolerated      Mobility  Bed Mobility Overal bed mobility: Modified Independent             General bed mobility comments: HOB 20 degrees  Transfers Overall transfer level: Needs assistance   Transfers: Sit to/from Stand Sit to Stand: Min guard         General transfer comment: cues for hand placement, sequence and safety  Ambulation/Gait Ambulation/Gait assistance: Min guard Ambulation Distance (Feet): 150 Feet Assistive device: Rolling walker (2 wheeled) Gait Pattern/deviations: Step-through pattern;Decreased dorsiflexion - left;Trunk flexed   Gait velocity interpretation: Below normal speed for age/gender General Gait Details: cues for posture, position in RW, increased stride and dorsiflexion LLE  Stairs            Wheelchair Mobility    Modified Rankin (Stroke Patients Only)       Balance Overall balance assessment: No apparent balance deficits (not formally assessed)                                           Pertinent Vitals/Pain Pain Assessment: No/denies pain    Home Living Family/patient expects to be discharged to:: Private  residence Living Arrangements: Spouse/significant other Available Help at Discharge: Family;Available 24 hours/day Type of Home: House Home Access: Level entry     Home Layout: Two level;Able to live on main level with bedroom/bathroom Home Equipment: Gilford Rile - 2 wheels;Bedside commode;Wheelchair - Brewing technologist;Other (comment) (adjustable bed, lift chair)      Prior Function Level of Independence: Independent         Comments: spouse does the homemaking     Hand Dominance        Extremity/Trunk Assessment   Upper Extremity Assessment Upper Extremity Assessment: Overall WFL for tasks assessed    Lower Extremity Assessment Lower Extremity Assessment: LLE deficits/detail LLE Deficits / Details: decreased ROM and strength as expected post op    Cervical / Trunk Assessment Cervical / Trunk Assessment: Kyphotic  Communication   Communication: No difficulties  Cognition Arousal/Alertness: Awake/alert Behavior During Therapy: WFL for tasks assessed/performed Overall Cognitive Status: Within Functional Limits for tasks assessed                                        General Comments      Exercises Total Joint Exercises Heel Slides: AROM;Left;Supine;10 reps Hip ABduction/ADduction: AROM;Left;Supine;10 reps Straight Leg Raises: AROM;Left;Supine;10 reps Long Arc Quad: AROM;Left;Seated;10 reps   Assessment/Plan    PT Assessment Patient needs continued PT services  PT Problem  List Decreased strength;Decreased mobility;Decreased activity tolerance;Decreased knowledge of use of DME;Pain;Decreased knowledge of precautions       PT Treatment Interventions Functional mobility training;Stair training;Gait training;Therapeutic exercise;Patient/family education;DME instruction;Therapeutic activities    PT Goals (Current goals can be found in the Care Plan section)  Acute Rehab PT Goals Patient Stated Goal: return to walking and getting out more PT  Goal Formulation: With patient Time For Goal Achievement: 06/06/17 Potential to Achieve Goals: Good    Frequency 7X/week   Barriers to discharge        Co-evaluation               AM-PAC PT "6 Clicks" Daily Activity  Outcome Measure Difficulty turning over in bed (including adjusting bedclothes, sheets and blankets)?: A Little Difficulty moving from lying on back to sitting on the side of the bed? : A Lot Difficulty sitting down on and standing up from a chair with arms (e.g., wheelchair, bedside commode, etc,.)?: A Little Help needed moving to and from a bed to chair (including a wheelchair)?: A Little Help needed walking in hospital room?: A Little Help needed climbing 3-5 steps with a railing? : A Little 6 Click Score: 17    End of Session Equipment Utilized During Treatment: Gait belt Activity Tolerance: Patient tolerated treatment well Patient left: in chair;with call bell/phone within reach;with family/visitor present;with chair alarm set Nurse Communication: Mobility status;Weight bearing status PT Visit Diagnosis: Other abnormalities of gait and mobility (R26.89);Difficulty in walking, not elsewhere classified (R26.2)    Time: 4540-9811 PT Time Calculation (min) (ACUTE ONLY): 32 min   Charges:   PT Evaluation $PT Eval Moderate Complexity: 1 Procedure PT Treatments $Gait Training: 8-22 mins   PT G Codes:        Elwyn Reach, PT [phone]   [place] B Hadriel Northup 05/30/2017, 9:27 AM

## 2017-05-30 NOTE — Anesthesia Postprocedure Evaluation (Signed)
Anesthesia Post Note  Patient: [name]  Procedure(s) Performed: Procedure(s) (LRB): LEFT TOTAL KNEE ARTHROPLASTY (Left)     Patient location during evaluation: PACU Anesthesia Type: Spinal Level of consciousness: oriented and awake and alert Pain management: pain level controlled Vital Signs Assessment: post-procedure vital signs reviewed and stable Respiratory status: spontaneous breathing, respiratory function stable and patient connected to nasal cannula oxygen Cardiovascular status: blood pressure returned to baseline and stable Postop Assessment: no headache, no backache and spinal receding Anesthetic complications: no Comments: No antiemetics given due to no use of inhalational anesthetics, and no patient complaint of nausea/vomiting.     Last Vitals:  Vitals:   05/30/17 0616 05/30/17 0919  BP: 97/70   Pulse: (!) 108 (!) 110  Resp: 18   Temp: 36.4 C     Last Pain:  Vitals:   05/30/17 0616  TempSrc: Oral  PainSc:                  Catalina Gravel

## 2017-05-31 NOTE — Progress Notes (Signed)
   Subjective:  Patient reports pain as mild.  Eager to go home today.  Objective:   VITALS:   Vitals:   05/30/17 0919 05/30/17 1459 05/30/17 2141 05/31/17 0521  BP:  115/68 (!) 105/52 126/73  Pulse: (!) 110 73 62 77  Resp:  18    Temp:  97.8 F (36.6 C) 97.9 F (36.6 C) 97.7 F (36.5 C)  TempSrc:  Oral Oral Oral  SpO2: 99% 100% 97% 100%    Neurologically intact Neurovascular intact Sensation intact distally Intact pulses distally Dorsiflexion/Plantar flexion intact Incision: dressing C/D/I and no drainage No cellulitis present Compartment soft   Lab Results  Component Value Date   WBC 11.1 (H) 05/30/2017   HGB 12.6 05/30/2017   HCT 40.1 05/30/2017   MCV 84.2 05/30/2017   PLT 233 05/30/2017     Assessment/Plan:  2 Days Post-Op   - home after PT session this morning  Eduard Roux 05/31/2017, 9:34 AM [phone]

## 2017-05-31 NOTE — Discharge Summary (Signed)
Physician Discharge Summary      Patient ID: [name] MRN: [id number] DOB/AGE: 08-Dec-1945 71 y.o.  Admit date: 05/29/2017 Discharge date: 05/31/2017  Admission Diagnoses:  <principal problem not specified>  Discharge Diagnoses:  Active Problems:   Total knee replacement status   Past Medical History:  Diagnosis Date  . Arthritis   . Dysrhythmia    new onset afib   . High cholesterol   . Hyperlipidemia   . Hypertension   . Hypothyroidism     Surgeries: Procedure(s): LEFT TOTAL KNEE ARTHROPLASTY on 05/29/2017   Consultants (if any):   Discharged Condition: Improved  Hospital Course: [name] is an 71 y.o. female who was admitted 05/29/2017 with a diagnosis of <principal problem not specified> and went to the operating room on 05/29/2017 and underwent the above named procedures.    She was given perioperative antibiotics:  Anti-infectives    Start     Dose/Rate Route Frequency Ordered Stop   05/29/17 1800  ceFAZolin (ANCEF) IVPB 2g/100 mL premix     2 g 200 mL/hr over 30 Minutes Intravenous Every 6 hours 05/29/17 1726 05/30/17 0114   05/29/17 1200  ceFAZolin (ANCEF) IVPB 2g/100 mL premix     2 g 200 mL/hr over 30 Minutes Intravenous To [facility] 05/28/17 1037 05/29/17 1237    .  She was given sequential compression devices, early ambulation, and eliquis for DVT prophylaxis.  She benefited maximally from the hospital stay and there were no complications.    Recent vital signs:  Vitals:   05/30/17 2141 05/31/17 0521  BP: (!) 105/52 126/73  Pulse: 62 77  Resp:    Temp: 97.9 F (36.6 C) 97.7 F (36.5 C)    Recent laboratory studies:  Lab Results  Component Value Date   HGB 12.6 05/30/2017   HGB 16.9 (H) 05/20/2017   HGB 14.6 04/16/2017   Lab Results  Component Value Date   WBC 11.1 (H) 05/30/2017   PLT 233 05/30/2017   Lab Results  Component Value Date   INR 1.05 05/29/2017   Lab Results  Component Value Date   NA 137 05/30/2017     K 4.9 05/30/2017   CL 104 05/30/2017   CO2 25 05/30/2017   BUN 16 05/30/2017   CREATININE 1.36 (H) 05/30/2017   GLUCOSE 148 (H) 05/30/2017    Discharge Medications:   Allergies as of 05/31/2017      Reactions   No Known Allergies       Medication List    TAKE these medications   acetaminophen 325 MG tablet Commonly known as:  TYLENOL Take 650 mg by mouth every 6 (six) hours as needed (for pain.).   amLODipine 10 MG tablet Commonly known as:  NORVASC Take 10 mg by mouth daily at 3 pm.   apixaban 5 MG Tabs tablet Commonly known as:  ELIQUIS Take 1 tablet (5 mg total) by mouth 2 (two) times daily.   atenolol 100 MG tablet Commonly known as:  TENORMIN Take 100 mg by mouth daily.   CALCIUM 600+D PLUS MINERALS PO Take 1 tablet by mouth daily at 3 pm.   furosemide 40 MG tablet Commonly known as:  LASIX Take 40 mg by mouth daily.   levothyroxine 125 MCG tablet Commonly known as:  SYNTHROID, LEVOTHROID Take 125 mcg by mouth daily.   losartan 25 MG tablet Commonly known as:  COZAAR Take 25 mg by mouth daily.   methocarbamol 750 MG tablet Commonly known as:  ROBAXIN  Take 1 tablet (750 mg total) by mouth 2 (two) times daily as needed for muscle spasms.   multivitamin tablet Take 1 tablet by mouth daily.   ondansetron 4 MG tablet Commonly known as:  ZOFRAN Take 1-2 tablets (4-8 mg total) by mouth every 8 (eight) hours as needed for nausea or vomiting.   oxyCODONE 10 mg 12 hr tablet Commonly known as:  OXYCONTIN Take 1 tablet (10 mg total) by mouth every 12 (twelve) hours.   oxyCODONE 5 MG immediate release tablet Commonly known as:  Oxy IR/ROXICODONE Take 1-3 tablets (5-15 mg total) by mouth every 4 (four) hours as needed.   pravastatin 80 MG tablet Commonly known as:  PRAVACHOL Take 80 mg by mouth daily at 3 pm.   promethazine 25 MG tablet Commonly known as:  PHENERGAN Take 1 tablet (25 mg total) by mouth every 6 (six) hours as needed for nausea.    senna-docusate 8.6-50 MG tablet Commonly known as:  SENOKOT S Take 1 tablet by mouth at bedtime as needed.   VICKS VAPOR INHALER IN Inhale 1 application into the lungs daily as needed (nasal congestion).            Durable Medical Equipment        Start     Ordered   05/29/17 1724  DME Walker rolling  Once    Question:  Patient needs a walker to treat with the following condition  Answer:  Total knee replacement status   05/29/17 1726   05/29/17 1724  DME 3 n 1  Once     05/29/17 1726   05/29/17 1724  DME Bedside commode  Once    Question:  Patient needs a bedside commode to treat with the following condition  Answer:  Total knee replacement status   05/29/17 1726      Diagnostic Studies: Dg Knee Left Port  Result Date: 05/29/2017 CLINICAL DATA:  Status post left total knee joint prosthesis placement. EXAM: PORTABLE LEFT KNEE - 1-2 VIEW COMPARISON:  Left knee series dated March 14, 2017 FINDINGS: There has been interval placement of left total knee joint prosthesis. Radiographic positioning of the prosthetic components is good. The interface with the native bone appears normal. There is a small amount of gas within the anterior aspect of the joint space and in the suprapatellar bursal region. IMPRESSION: There is no immediate postprocedure complication following left total knee joint prosthesis placement. Electronically Signed   By: David  Martinique M.D.   On: 05/29/2017 16:30    Disposition: 01-Home or Self Care  Discharge Instructions    Call MD / Call 911    Complete by:  As directed    If you experience chest pain or shortness of breath, CALL 911 and be transported to the hospital emergency room.  If you develope a fever above 101.5 F, pus (white drainage) or increased drainage or redness at the wound, or calf pain, call your surgeon's office.   Constipation Prevention    Complete by:  As directed    Drink plenty of fluids.  Prune juice may be helpful.  You may use a stool  softener, such as Colace (over the counter) 100 mg twice a day.  Use MiraLax (over the counter) for constipation as needed.   Driving restrictions    Complete by:  As directed    No driving while taking narcotic pain meds.   Increase activity slowly as tolerated    Complete by:  As directed  Follow-up Information    Leandrew Koyanagi, MD In 2 weeks.   Specialty:  Orthopedic Surgery Why:  For suture removal, For wound re-check Contact information: [place] [address] [phone]            Signed: Eduard Roux 05/31/2017, 9:35 AM

## 2017-05-31 NOTE — Care Management Note (Signed)
Case Management Note  Patient Details  Name: [name] MRN: [id number] Date of Birth: 31-Oct-1946  Subjective/Objective:                 Spoke with patient, she states she has a RW and a 3/1 at home. Dr Erlinda Hong placed referral to [facility]. Verfied with clinical liason referral in place for [facility] PT. No other CM needs at this time.    Action/Plan:  Will DC to home w Post.   Expected Discharge Date:  05/31/17               Expected Discharge Plan:  [place]  In-House Referral:     Discharge planning Services  CM Consult  Post Acute Care Choice:  Home Health Choice offered to:  Patient  DME Arranged:    DME Agency:     HH Arranged:  PT [place]:  [facility]  Status of Service:  Completed, signed off  If discussed at [employer] of Stay Meetings, dates discussed:    Additional Comments:  Carles Collet, RN 05/31/2017, 9:56 AM

## 2017-05-31 NOTE — Progress Notes (Signed)
Discharged to home. Verbalized understanding of written and verbal discharge instructions. teachback completed. Prescriptions given. Left unit via wheelchair, accompanied by spouse and transportation personnel

## 2017-05-31 NOTE — Progress Notes (Signed)
Physical Therapy Treatment Patient Details Name: [name] MRN: [id number] DOB: 08-15-1946 Today's Date: 05/31/2017    History of Present Illness 71 yo admitted for Left TKA. PMhx: HLD, HTN, back sx    PT Comments    Pt is progressing towards goals. Increased ambulation tolerance and decreased assist. Practiced navigating threshold step with RW. Pt required min guard to supervision throughout mobility. Will continue to follow acutely to maximize functional mobility independence.    Follow Up Recommendations  DC plan and follow up therapy as arranged by surgeon     Equipment Recommendations  None recommended by PT    Recommendations for Other Services OT consult     Precautions / Restrictions Precautions Precautions: Knee Precaution Booklet Issued: Yes (comment) Precaution Comments: Reviewed sitting exercise and reviewed handout with pt  Restrictions Weight Bearing Restrictions: Yes RUE Weight Bearing: Weight bearing as tolerated LLE Weight Bearing: Weight bearing as tolerated    Mobility  Bed Mobility               General bed mobility comments: Sitting EOB upon entry   Transfers Overall transfer level: Needs assistance Equipment used: Rolling walker (2 wheeled) Transfers: Sit to/from Stand Sit to Stand: Min guard         General transfer comment: Min guard for safety. Safe hand placement   Ambulation/Gait Ambulation/Gait assistance: Min guard;Supervision Ambulation Distance (Feet): 200 Feet Assistive device: Rolling walker (2 wheeled) Gait Pattern/deviations: Step-through pattern;Decreased dorsiflexion - left;Trunk flexed Gait velocity: Decreased Gait velocity interpretation: Below normal speed for age/gender General Gait Details: Cues for increasing R step length during ambulation and for appropriate proximity to device.    Stairs Stairs: Yes   Stair Management: Step to pattern;Forwards;With walker (threshold step ) Number of Stairs: 1 General  stair comments: Practiced threshold step as pt has at home. Verbal cues for safety and sequencing with RW. Verbal cues for appropriate LE sequencing for ascending/descending step   Wheelchair Mobility    Modified Rankin (Stroke Patients Only)       Balance Overall balance assessment: Needs assistance Sitting-balance support: No upper extremity supported;Feet supported Sitting balance-Leahy Scale: Good     Standing balance support: No upper extremity supported;During functional activity;Bilateral upper extremity supported Standing balance-Leahy Scale: Fair Standing balance comment: Able to stand at sink to brush teeth without UE support                             Cognition Arousal/Alertness: Awake/alert Behavior During Therapy: WFL for tasks assessed/performed Overall Cognitive Status: Within Functional Limits for tasks assessed                                        Exercises Total Joint Exercises Long Arc Quad: AROM;Left;10 reps Knee Flexion: AROM;Left;10 reps Goniometric ROM: 7-80    General Comments General comments (skin integrity, edema, etc.): Pt's husband present during session.       Pertinent Vitals/Pain Pain Assessment: 0-10 Pain Score: 4  Pain Location: L knee Pain Descriptors / Indicators: Operative site guarding;Sore Pain Intervention(s): Limited activity within patient's tolerance;Monitored during session;Repositioned    Home Living                      Prior Function            PT Goals (current goals can now be  found in the care plan section) Acute Rehab PT Goals Patient Stated Goal: return to walking and getting out more PT Goal Formulation: With patient Time For Goal Achievement: 06/06/17 Potential to Achieve Goals: Good Progress towards PT goals: Progressing toward goals    Frequency    7X/week      PT Plan Current plan remains appropriate    Co-evaluation              AM-PAC PT "6  Clicks" Daily Activity  Outcome Measure  Difficulty turning over in bed (including adjusting bedclothes, sheets and blankets)?: A Little Difficulty moving from lying on back to sitting on the side of the bed? : A Lot Difficulty sitting down on and standing up from a chair with arms (e.g., wheelchair, bedside commode, etc,.)?: A Little Help needed moving to and from a bed to chair (including a wheelchair)?: A Little Help needed walking in hospital room?: A Little Help needed climbing 3-5 steps with a railing? : A Little 6 Click Score: 17    End of Session Equipment Utilized During Treatment: Gait belt Activity Tolerance: Patient tolerated treatment well Patient left: in chair;with call bell/phone within reach;with family/visitor present Nurse Communication: Mobility status PT Visit Diagnosis: Other abnormalities of gait and mobility (R26.89);Difficulty in walking, not elsewhere classified (R26.2)     Time: 3159-4585 PT Time Calculation (min) (ACUTE ONLY): 26 min  Charges:  $Gait Training: 8-22 mins $Therapeutic Exercise: 8-22 mins                    G Codes:       Leighton Ruff, PT, DPT  Acute Rehabilitation Services  Pager: [phone]    Rudean Hitt 05/31/2017, 12:21 PM

## 2017-06-02 ENCOUNTER — Encounter (HOSPITAL_COMMUNITY): Payer: Self-pay | Admitting: Orthopaedic Surgery

## 2017-06-03 DIAGNOSIS — Z471 Aftercare following joint replacement surgery: Secondary | ICD-10-CM | POA: Diagnosis not present

## 2017-06-03 DIAGNOSIS — I4891 Unspecified atrial fibrillation: Secondary | ICD-10-CM | POA: Diagnosis not present

## 2017-06-03 DIAGNOSIS — Z9181 History of falling: Secondary | ICD-10-CM | POA: Diagnosis not present

## 2017-06-03 DIAGNOSIS — Z96652 Presence of left artificial knee joint: Secondary | ICD-10-CM | POA: Diagnosis not present

## 2017-06-03 DIAGNOSIS — I10 Essential (primary) hypertension: Secondary | ICD-10-CM | POA: Diagnosis not present

## 2017-06-03 DIAGNOSIS — Z7901 Long term (current) use of anticoagulants: Secondary | ICD-10-CM | POA: Diagnosis not present

## 2017-06-09 DIAGNOSIS — Z471 Aftercare following joint replacement surgery: Secondary | ICD-10-CM | POA: Diagnosis not present

## 2017-06-09 DIAGNOSIS — I4891 Unspecified atrial fibrillation: Secondary | ICD-10-CM | POA: Diagnosis not present

## 2017-06-09 DIAGNOSIS — I10 Essential (primary) hypertension: Secondary | ICD-10-CM | POA: Diagnosis not present

## 2017-06-09 DIAGNOSIS — Z9181 History of falling: Secondary | ICD-10-CM | POA: Diagnosis not present

## 2017-06-09 DIAGNOSIS — Z7901 Long term (current) use of anticoagulants: Secondary | ICD-10-CM | POA: Diagnosis not present

## 2017-06-09 DIAGNOSIS — Z96652 Presence of left artificial knee joint: Secondary | ICD-10-CM | POA: Diagnosis not present

## 2017-06-12 ENCOUNTER — Ambulatory Visit (INDEPENDENT_AMBULATORY_CARE_PROVIDER_SITE_OTHER): Payer: PPO | Admitting: Orthopaedic Surgery

## 2017-06-12 DIAGNOSIS — M1712 Unilateral primary osteoarthritis, left knee: Secondary | ICD-10-CM

## 2017-06-12 DIAGNOSIS — Z96652 Presence of left artificial knee joint: Secondary | ICD-10-CM

## 2017-06-12 NOTE — Progress Notes (Signed)
[name] is 2 weeks status post left total knee replacement. Her range of motion is progressing with home health physical therapy. Her surgical incision is healed. There is no signs of infection. Expected postoperative swelling. Her pain is well-controlled. Range of motion is 5-90. Continue with physical therapy. Referral for outpatient physical therapy was given. She is on baseline eliquis.  I'll see her back in 4 weeks with 2 view x-rays of the left knee.

## 2017-06-20 DIAGNOSIS — M1712 Unilateral primary osteoarthritis, left knee: Secondary | ICD-10-CM | POA: Diagnosis not present

## 2017-06-24 DIAGNOSIS — M1712 Unilateral primary osteoarthritis, left knee: Secondary | ICD-10-CM | POA: Diagnosis not present

## 2017-06-26 DIAGNOSIS — M1712 Unilateral primary osteoarthritis, left knee: Secondary | ICD-10-CM | POA: Diagnosis not present

## 2017-06-30 ENCOUNTER — Other Ambulatory Visit (INDEPENDENT_AMBULATORY_CARE_PROVIDER_SITE_OTHER): Payer: Self-pay | Admitting: Orthopaedic Surgery

## 2017-07-01 DIAGNOSIS — M1712 Unilateral primary osteoarthritis, left knee: Secondary | ICD-10-CM | POA: Diagnosis not present

## 2017-07-03 DIAGNOSIS — M1712 Unilateral primary osteoarthritis, left knee: Secondary | ICD-10-CM | POA: Diagnosis not present

## 2017-07-07 DIAGNOSIS — M1712 Unilateral primary osteoarthritis, left knee: Secondary | ICD-10-CM | POA: Diagnosis not present

## 2017-07-09 DIAGNOSIS — M1712 Unilateral primary osteoarthritis, left knee: Secondary | ICD-10-CM | POA: Diagnosis not present

## 2017-07-10 ENCOUNTER — Encounter (INDEPENDENT_AMBULATORY_CARE_PROVIDER_SITE_OTHER): Payer: Self-pay | Admitting: Orthopaedic Surgery

## 2017-07-10 ENCOUNTER — Ambulatory Visit (INDEPENDENT_AMBULATORY_CARE_PROVIDER_SITE_OTHER): Payer: PPO | Admitting: Orthopaedic Surgery

## 2017-07-10 ENCOUNTER — Ambulatory Visit (INDEPENDENT_AMBULATORY_CARE_PROVIDER_SITE_OTHER): Payer: PPO

## 2017-07-10 DIAGNOSIS — Z96652 Presence of left artificial knee joint: Secondary | ICD-10-CM

## 2017-07-10 NOTE — Progress Notes (Signed)
Patient is 6 weeks status post left total knee replacement. She is doing well overall. She is working with physical therapy. Her range of motion is 5-90. Her surgical scar is fully healed. She is on Eliquis was at baseline. Continue with physical therapy. She looks good from my standpoint. X-rays show stable alignment knee replacement. May drive at this point. Follow-up in 4 weeks for recheck of her range of motion. I will like this to be a little bit better by her next visit.

## 2017-07-14 DIAGNOSIS — M1712 Unilateral primary osteoarthritis, left knee: Secondary | ICD-10-CM | POA: Diagnosis not present

## 2017-07-17 DIAGNOSIS — M1712 Unilateral primary osteoarthritis, left knee: Secondary | ICD-10-CM | POA: Diagnosis not present

## 2017-07-21 DIAGNOSIS — M1712 Unilateral primary osteoarthritis, left knee: Secondary | ICD-10-CM | POA: Diagnosis not present

## 2017-07-23 DIAGNOSIS — M1712 Unilateral primary osteoarthritis, left knee: Secondary | ICD-10-CM | POA: Diagnosis not present

## 2017-07-25 DIAGNOSIS — I4891 Unspecified atrial fibrillation: Secondary | ICD-10-CM | POA: Diagnosis not present

## 2017-07-25 DIAGNOSIS — R0602 Shortness of breath: Secondary | ICD-10-CM | POA: Diagnosis not present

## 2017-07-25 DIAGNOSIS — I10 Essential (primary) hypertension: Secondary | ICD-10-CM | POA: Diagnosis not present

## 2017-07-31 DIAGNOSIS — M1712 Unilateral primary osteoarthritis, left knee: Secondary | ICD-10-CM | POA: Diagnosis not present

## 2017-08-06 DIAGNOSIS — M1712 Unilateral primary osteoarthritis, left knee: Secondary | ICD-10-CM | POA: Diagnosis not present

## 2017-08-07 ENCOUNTER — Ambulatory Visit (INDEPENDENT_AMBULATORY_CARE_PROVIDER_SITE_OTHER): Payer: PPO | Admitting: Orthopaedic Surgery

## 2017-08-07 DIAGNOSIS — Z96652 Presence of left artificial knee joint: Secondary | ICD-10-CM

## 2017-08-07 MED ORDER — AMOXICILLIN 500 MG PO CAPS: 2000.0000 mg | ORAL_CAPSULE | Freq: Once | ORAL | 0 refills | Status: AC

## 2017-08-07 NOTE — Progress Notes (Signed)
[name] is 70 days status post left total knee replacement. She is doing well has finished physical therapy. She denies any pain. She is walking with a cane. She is doing home exercises. She is very happy with her result.  Physical exam shows a fully healed surgical scar. Range of motion is 5 to 90. No significant swelling.  Patient is doing well from my standpoint. Continue with home exercises for range of motion and ADLs and strengthening. Follow up in 3 months with 2 view x-rays of the left knee. Prescription for amoxicillin for her upcoming dentist visit

## 2017-08-29 DIAGNOSIS — Z1231 Encounter for screening mammogram for malignant neoplasm of breast: Secondary | ICD-10-CM | POA: Diagnosis not present

## 2017-09-08 ENCOUNTER — Encounter: Payer: Self-pay | Admitting: Internal Medicine

## 2017-09-08 DIAGNOSIS — E7849 Other hyperlipidemia: Secondary | ICD-10-CM | POA: Diagnosis not present

## 2017-09-08 DIAGNOSIS — E038 Other specified hypothyroidism: Secondary | ICD-10-CM | POA: Diagnosis not present

## 2017-09-08 DIAGNOSIS — R7309 Other abnormal glucose: Secondary | ICD-10-CM | POA: Diagnosis not present

## 2017-09-08 DIAGNOSIS — I10 Essential (primary) hypertension: Secondary | ICD-10-CM | POA: Diagnosis not present

## 2017-09-08 DIAGNOSIS — M859 Disorder of bone density and structure, unspecified: Secondary | ICD-10-CM | POA: Diagnosis not present

## 2017-09-08 DIAGNOSIS — Z0000 Encounter for general adult medical examination without abnormal findings: Secondary | ICD-10-CM | POA: Diagnosis not present

## 2017-09-11 DIAGNOSIS — I10 Essential (primary) hypertension: Secondary | ICD-10-CM | POA: Diagnosis not present

## 2017-09-11 DIAGNOSIS — I4891 Unspecified atrial fibrillation: Secondary | ICD-10-CM | POA: Diagnosis not present

## 2017-09-11 DIAGNOSIS — R0602 Shortness of breath: Secondary | ICD-10-CM | POA: Diagnosis not present

## 2017-09-15 DIAGNOSIS — I2789 Other specified pulmonary heart diseases: Secondary | ICD-10-CM | POA: Diagnosis not present

## 2017-09-15 DIAGNOSIS — Z23 Encounter for immunization: Secondary | ICD-10-CM | POA: Diagnosis not present

## 2017-09-15 DIAGNOSIS — Z0000 Encounter for general adult medical examination without abnormal findings: Secondary | ICD-10-CM | POA: Diagnosis not present

## 2017-09-15 DIAGNOSIS — E7849 Other hyperlipidemia: Secondary | ICD-10-CM | POA: Diagnosis not present

## 2017-09-15 DIAGNOSIS — M179 Osteoarthritis of knee, unspecified: Secondary | ICD-10-CM | POA: Diagnosis not present

## 2017-09-15 DIAGNOSIS — N183 Chronic kidney disease, stage 3 (moderate): Secondary | ICD-10-CM | POA: Diagnosis not present

## 2017-09-15 DIAGNOSIS — I480 Paroxysmal atrial fibrillation: Secondary | ICD-10-CM | POA: Diagnosis not present

## 2017-09-15 DIAGNOSIS — R05 Cough: Secondary | ICD-10-CM | POA: Diagnosis not present

## 2017-09-15 DIAGNOSIS — Z1389 Encounter for screening for other disorder: Secondary | ICD-10-CM | POA: Diagnosis not present

## 2017-09-15 DIAGNOSIS — I129 Hypertensive chronic kidney disease with stage 1 through stage 4 chronic kidney disease, or unspecified chronic kidney disease: Secondary | ICD-10-CM | POA: Diagnosis not present

## 2017-09-15 DIAGNOSIS — Z6838 Body mass index (BMI) 38.0-38.9, adult: Secondary | ICD-10-CM | POA: Diagnosis not present

## 2017-09-15 DIAGNOSIS — I5022 Chronic systolic (congestive) heart failure: Secondary | ICD-10-CM | POA: Diagnosis not present

## 2017-09-22 DIAGNOSIS — Z1212 Encounter for screening for malignant neoplasm of rectum: Secondary | ICD-10-CM | POA: Diagnosis not present

## 2017-11-06 ENCOUNTER — Ambulatory Visit (INDEPENDENT_AMBULATORY_CARE_PROVIDER_SITE_OTHER): Payer: PPO | Admitting: Orthopaedic Surgery

## 2017-11-06 ENCOUNTER — Ambulatory Visit (INDEPENDENT_AMBULATORY_CARE_PROVIDER_SITE_OTHER): Payer: PPO

## 2017-11-06 ENCOUNTER — Encounter (INDEPENDENT_AMBULATORY_CARE_PROVIDER_SITE_OTHER): Payer: Self-pay | Admitting: Orthopaedic Surgery

## 2017-11-06 DIAGNOSIS — Z96652 Presence of left artificial knee joint: Secondary | ICD-10-CM

## 2017-11-06 MED ORDER — AMOXICILLIN 500 MG PO CAPS: 2000.0000 mg | ORAL_CAPSULE | Freq: Once | ORAL | 2 refills | Status: AC

## 2017-11-06 NOTE — Progress Notes (Signed)
   Office Visit Note   Patient: [name]           Date of Birth: 10-28-1946           MRN: [id number] Visit Date: 11/06/2017              Requested by: Shon Baton, [address] [place], [place] [address] PCP: Shon Baton, MD   Assessment & Plan: Visit Diagnoses:  1. Status post total left knee replacement     Plan: Patient is doing well from left total knee replacement.  I gave her a prescription for amoxicillin for her upcoming dentist visit.  Follow-up in 6 months for her one-year visit with three-view x-rays of the left knee.  Follow-Up Instructions: Return in about 6 months (around 05/07/2018).   Orders:  Orders Placed This Encounter  Procedures  . XR KNEE 3 VIEW LEFT   Meds ordered this encounter  Medications  . amoxicillin (AMOXIL) 500 MG capsule    Sig: Take 4 capsules (2,000 mg total) by mouth once for 1 dose.    Dispense:  4 capsule    Refill:  2      Procedures: No procedures performed   Clinical Data: No additional findings.   Subjective: Chief Complaint  Patient presents with  . Left Knee - Pain, Routine Post Op, Follow-up    Patient is 5 months status post left total knee replacement.  She is very happy with her progress.  She has minimal pain with mainly weather changes.    Review of Systems   Objective: Vital Signs: There were no vitals taken for this visit.  Physical Exam  Ortho Exam Left knee exam shows a fully healed surgical scar with excellent range of motion. Specialty Comments:  No specialty comments available.  Imaging: Xr Knee 3 View Left  Result Date: 11/06/2017 Stable left total knee replacement in good alignment    PMFS History: Patient Active Problem List   Diagnosis Date Noted  . Total knee replacement status 05/29/2017   Past Medical History:  Diagnosis Date  . Arthritis   . Dysrhythmia    new onset afib   . High cholesterol   . Hyperlipidemia   . Hypertension   . Hypothyroidism     Family  History  Problem Relation Age of Onset  . Colon cancer Neg Hx   . Stomach cancer Neg Hx     Past Surgical History:  Procedure Laterality Date  . BACK SURGERY  2012  . CARDIOVASCULAR STRESS TEST    . COLONOSCOPY    . TOTAL KNEE ARTHROPLASTY Left 05/29/2017   Procedure: LEFT TOTAL KNEE ARTHROPLASTY;  Surgeon: Leandrew Koyanagi, MD;  Location: [place];  Service: Orthopedics;  Laterality: Left;  . TUBAL LIGATION  1977  . US ECHOCARDIOGRAPHY     Social History   Occupational History  . Not on file  Tobacco Use  . Smoking status: Never Smoker  . Smokeless tobacco: Never Used  Substance and Sexual Activity  . Alcohol use: No  . Drug use: No  . Sexual activity: Not on file

## 2017-12-18 DIAGNOSIS — I4891 Unspecified atrial fibrillation: Secondary | ICD-10-CM | POA: Diagnosis not present

## 2017-12-18 DIAGNOSIS — I10 Essential (primary) hypertension: Secondary | ICD-10-CM | POA: Diagnosis not present

## 2017-12-18 DIAGNOSIS — E7800 Pure hypercholesterolemia, unspecified: Secondary | ICD-10-CM | POA: Diagnosis not present

## 2017-12-18 DIAGNOSIS — E668 Other obesity: Secondary | ICD-10-CM | POA: Diagnosis not present

## 2018-03-17 DIAGNOSIS — E7849 Other hyperlipidemia: Secondary | ICD-10-CM | POA: Diagnosis not present

## 2018-03-17 DIAGNOSIS — I480 Paroxysmal atrial fibrillation: Secondary | ICD-10-CM | POA: Diagnosis not present

## 2018-03-17 DIAGNOSIS — Z6837 Body mass index (BMI) 37.0-37.9, adult: Secondary | ICD-10-CM | POA: Diagnosis not present

## 2018-03-17 DIAGNOSIS — E038 Other specified hypothyroidism: Secondary | ICD-10-CM | POA: Diagnosis not present

## 2018-03-17 DIAGNOSIS — I129 Hypertensive chronic kidney disease with stage 1 through stage 4 chronic kidney disease, or unspecified chronic kidney disease: Secondary | ICD-10-CM | POA: Diagnosis not present

## 2018-03-17 DIAGNOSIS — I5022 Chronic systolic (congestive) heart failure: Secondary | ICD-10-CM | POA: Diagnosis not present

## 2018-03-17 DIAGNOSIS — M1990 Unspecified osteoarthritis, unspecified site: Secondary | ICD-10-CM | POA: Diagnosis not present

## 2018-03-17 DIAGNOSIS — N183 Chronic kidney disease, stage 3 (moderate): Secondary | ICD-10-CM | POA: Diagnosis not present

## 2018-05-07 ENCOUNTER — Encounter (INDEPENDENT_AMBULATORY_CARE_PROVIDER_SITE_OTHER): Payer: Self-pay | Admitting: Orthopaedic Surgery

## 2018-05-07 ENCOUNTER — Ambulatory Visit (INDEPENDENT_AMBULATORY_CARE_PROVIDER_SITE_OTHER): Payer: PPO

## 2018-05-07 ENCOUNTER — Ambulatory Visit (INDEPENDENT_AMBULATORY_CARE_PROVIDER_SITE_OTHER): Payer: PPO | Admitting: Orthopaedic Surgery

## 2018-05-07 DIAGNOSIS — Z96652 Presence of left artificial knee joint: Secondary | ICD-10-CM

## 2018-05-07 NOTE — Progress Notes (Signed)
   Post-Op Visit Note   Patient: [name]           Date of Birth: Jul 14, 1946           MRN: [id number] Visit Date: 05/07/2018 PCP: Shon Baton, MD   Assessment & Plan:  Chief Complaint:  Chief Complaint  Patient presents with  . Left Knee - Pain   Visit Diagnoses:  1. Status post total left knee replacement     Plan: Patient is a pleasant 72 year old female who presents to our clinic today 11 months status post left total knee replacement, date of surgery 05/29/2017.  She has been doing very well since surgery.  She does note occasional soreness but nothing more.  She was diagnosed with A. fib shortly after surgery and was advised to use a rolling walker for more stability.  Overall doing well.  Examination of the left knee reveals a well-healed surgical incision without evidence of infection.  Range of motion 2 to 100 degrees.  She is stable to valgus and varus stress.  At this point, she will continue to work on her home exercise program.  She will follow-up with [name] in 1 years time for repeat evaluation and x-ray.  Follow-Up Instructions: Return in about 1 year (around 05/08/2019).   Orders:  Orders Placed This Encounter  Procedures  . XR KNEE 3 VIEW LEFT   No orders of the defined types were placed in this encounter.   Imaging: Xr Knee 3 View Left  Result Date: 05/07/2018 Stable left total knee replacement   PMFS History: Patient Active Problem List   Diagnosis Date Noted  . Total knee replacement status 05/29/2017   Past Medical History:  Diagnosis Date  . Arthritis   . Dysrhythmia    new onset afib   . High cholesterol   . Hyperlipidemia   . Hypertension   . Hypothyroidism     Family History  Problem Relation Age of Onset  . Colon cancer Neg Hx   . Stomach cancer Neg Hx     Past Surgical History:  Procedure Laterality Date  . BACK SURGERY  2012  . CARDIOVASCULAR STRESS TEST    . COLONOSCOPY    . TOTAL KNEE ARTHROPLASTY Left 05/29/2017   Procedure:  LEFT TOTAL KNEE ARTHROPLASTY;  Surgeon: Leandrew Koyanagi, MD;  Location: [facility];  Service: Orthopedics;  Laterality: Left;  . TUBAL LIGATION  1977  . US ECHOCARDIOGRAPHY     Social History   Occupational History  . Not on file  Tobacco Use  . Smoking status: Never Smoker  . Smokeless tobacco: Never Used  Substance and Sexual Activity  . Alcohol use: No  . Drug use: No  . Sexual activity: Not on file

## 2018-05-09 IMAGING — CR DG CHEST 2V
2 series · 2 of 2 positions shown · non-contrast
Comparison: 01/11/2011

CLINICAL DATA: New onset atrial fibrillation

EXAM:
CHEST  2 VIEW

[chest pa]
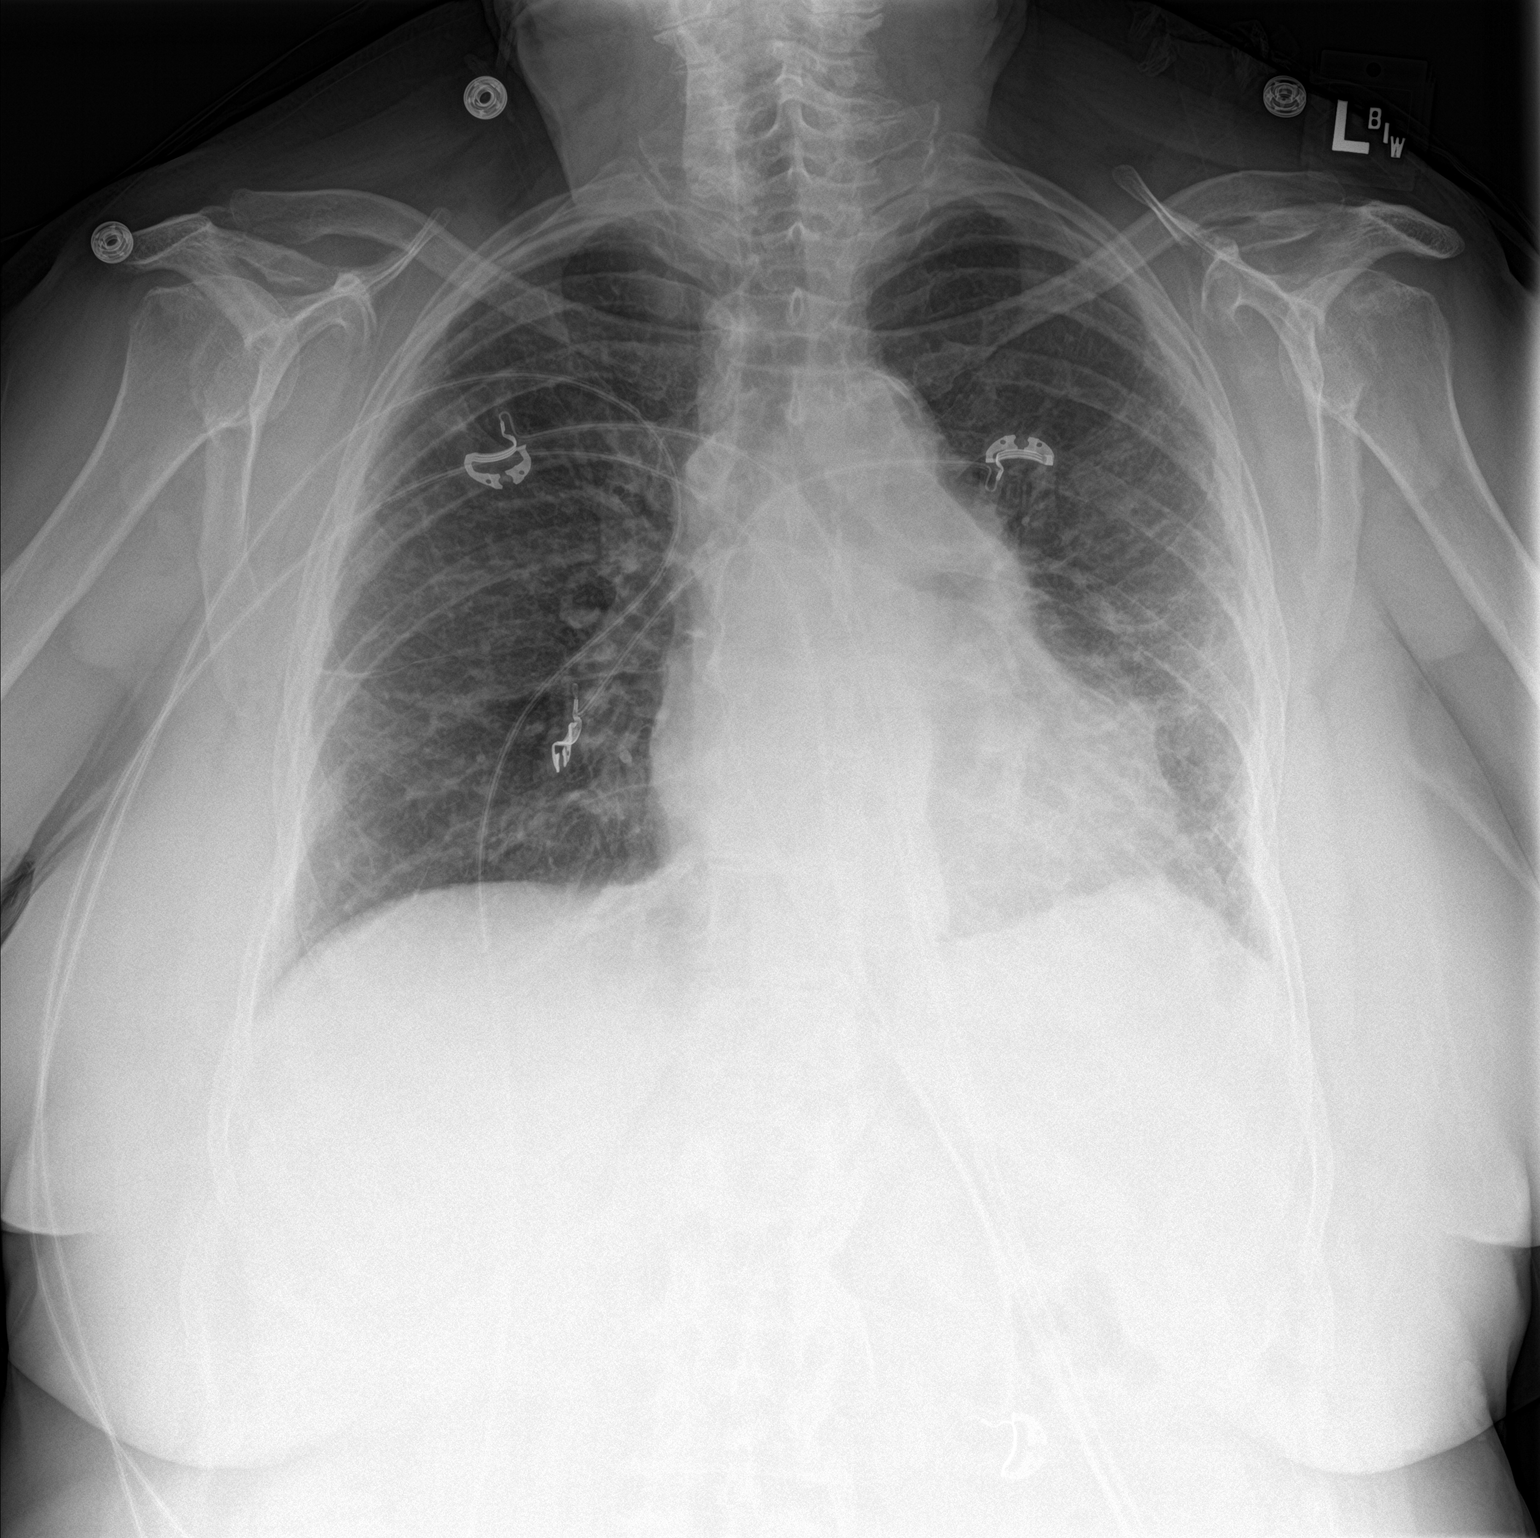

[chest lat]
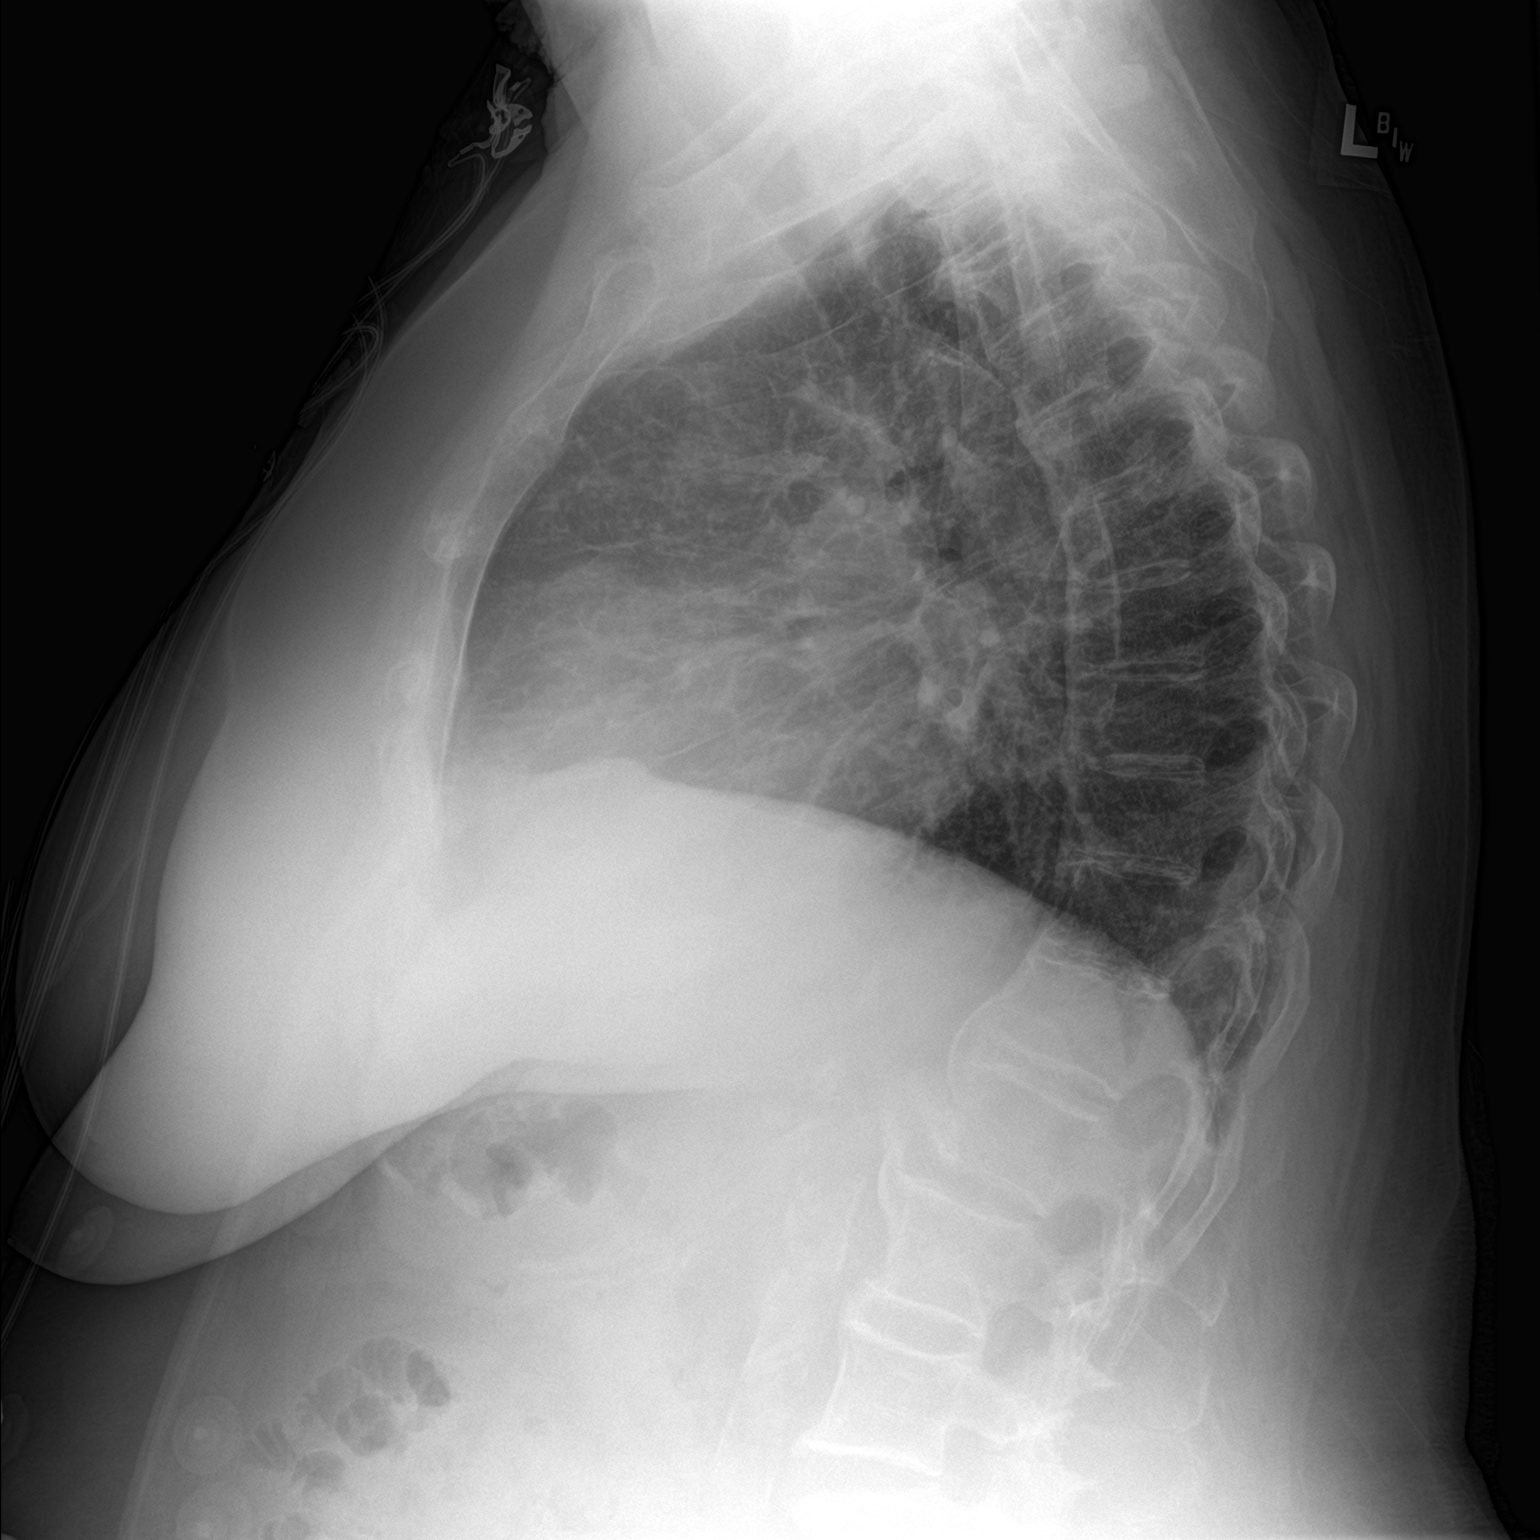

[2 of 2 positions shown; findings below may reference images not displayed]

FINDINGS: No cardiomegaly. Peripheral reticulation on the left more than right
that is progressed. There is no generalized Kerley lines,
consolidation, effusion, or pneumothorax. Bulky spondylosis, which
likely accounts for undulation along the descending aortic shadow.
No acute osseous finding.
IMPRESSION: 1. No acute finding.
2. Chronic lung disease that has likely progressed from 2292.

## 2018-06-21 IMAGING — DX DG KNEE 1-2V PORT*L*
2 series · 2 of 2 positions shown · non-contrast
Comparison: Left knee series dated March 14, 2017

CLINICAL DATA: Status post left total knee joint prosthesis
placement.

EXAM:
PORTABLE LEFT KNEE - 1-2 VIEW

[knee ap]
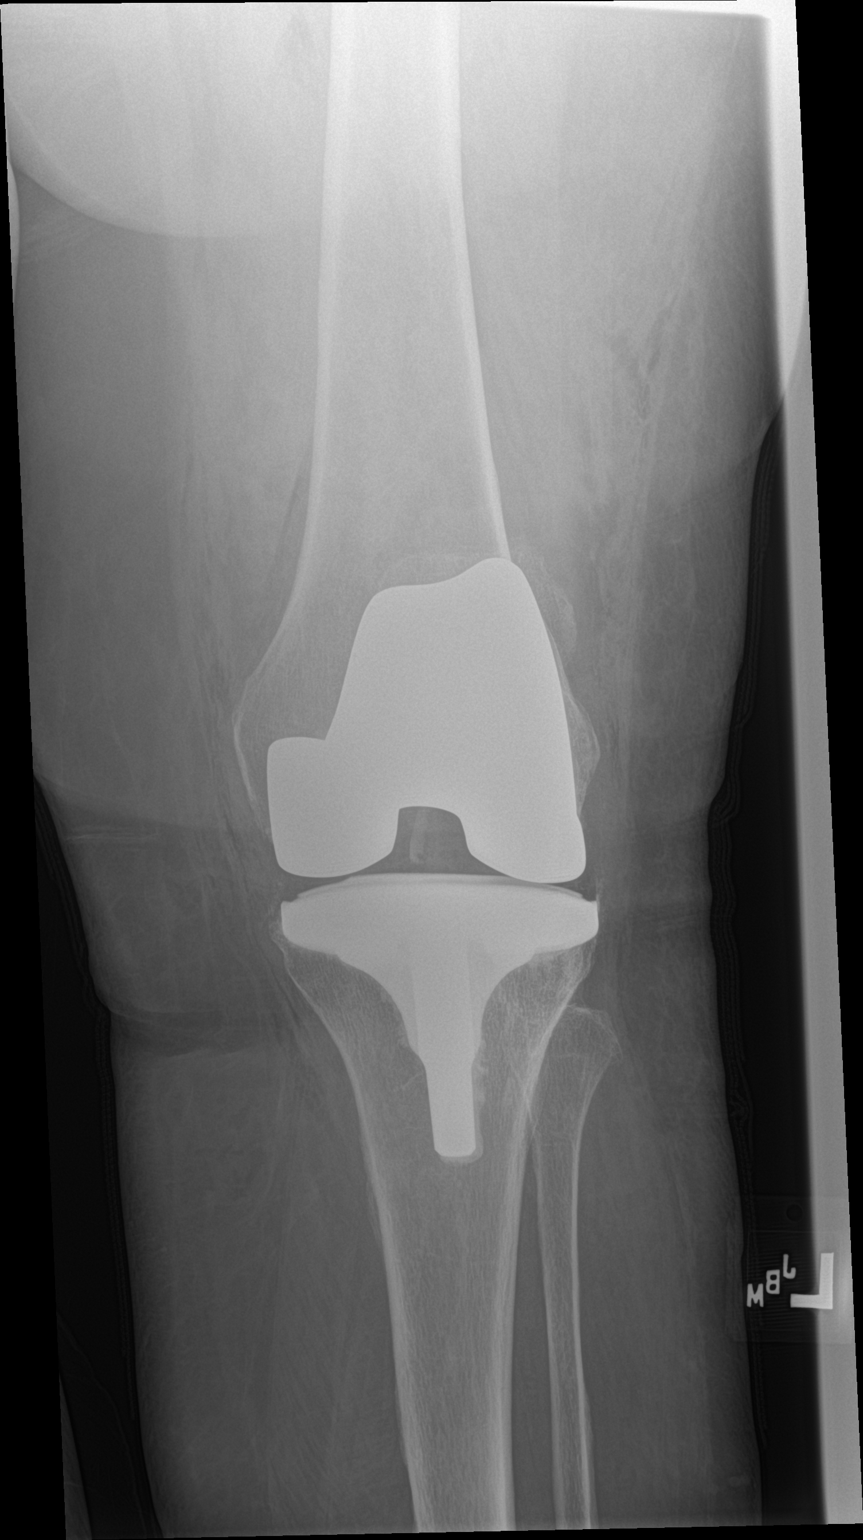

[knee lat]
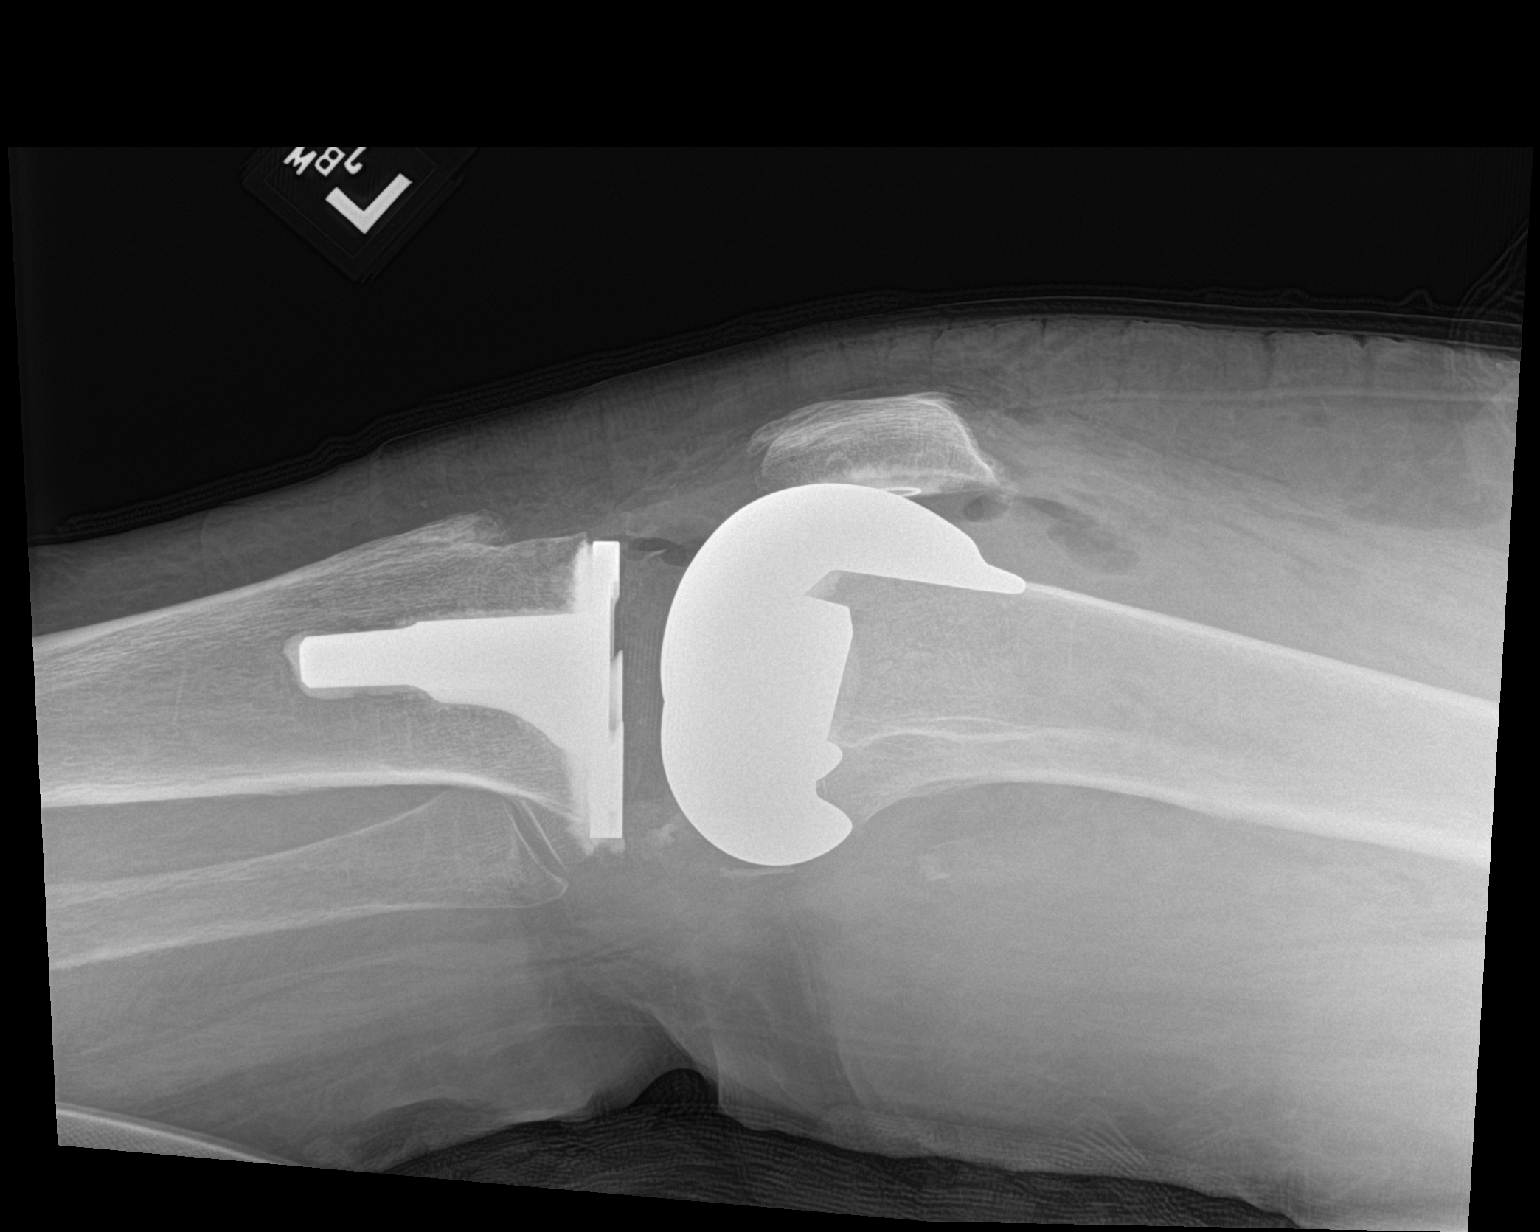

[2 of 2 positions shown; findings below may reference images not displayed]

FINDINGS: There has been interval placement of left total knee joint
prosthesis. Radiographic positioning of the prosthetic components is
good. The interface with the native bone appears normal. There is a
small amount of gas within the anterior aspect of the joint space
and in the suprapatellar bursal region.
IMPRESSION: There is no immediate postprocedure complication following left
total knee joint prosthesis placement.

## 2018-09-04 DIAGNOSIS — Z1231 Encounter for screening mammogram for malignant neoplasm of breast: Secondary | ICD-10-CM | POA: Diagnosis not present

## 2018-09-11 DIAGNOSIS — E7849 Other hyperlipidemia: Secondary | ICD-10-CM | POA: Diagnosis not present

## 2018-09-11 DIAGNOSIS — M859 Disorder of bone density and structure, unspecified: Secondary | ICD-10-CM | POA: Diagnosis not present

## 2018-09-11 DIAGNOSIS — I10 Essential (primary) hypertension: Secondary | ICD-10-CM | POA: Diagnosis not present

## 2018-09-11 DIAGNOSIS — R7309 Other abnormal glucose: Secondary | ICD-10-CM | POA: Diagnosis not present

## 2018-09-11 DIAGNOSIS — E038 Other specified hypothyroidism: Secondary | ICD-10-CM | POA: Diagnosis not present

## 2018-09-17 DIAGNOSIS — R2689 Other abnormalities of gait and mobility: Secondary | ICD-10-CM | POA: Diagnosis not present

## 2018-09-17 DIAGNOSIS — Z1389 Encounter for screening for other disorder: Secondary | ICD-10-CM | POA: Diagnosis not present

## 2018-09-17 DIAGNOSIS — I2721 Secondary pulmonary arterial hypertension: Secondary | ICD-10-CM | POA: Diagnosis not present

## 2018-09-17 DIAGNOSIS — E042 Nontoxic multinodular goiter: Secondary | ICD-10-CM | POA: Diagnosis not present

## 2018-09-17 DIAGNOSIS — E038 Other specified hypothyroidism: Secondary | ICD-10-CM | POA: Diagnosis not present

## 2018-09-17 DIAGNOSIS — N183 Chronic kidney disease, stage 3 (moderate): Secondary | ICD-10-CM | POA: Diagnosis not present

## 2018-09-17 DIAGNOSIS — Z23 Encounter for immunization: Secondary | ICD-10-CM | POA: Diagnosis not present

## 2018-09-17 DIAGNOSIS — I480 Paroxysmal atrial fibrillation: Secondary | ICD-10-CM | POA: Diagnosis not present

## 2018-09-17 DIAGNOSIS — Z6837 Body mass index (BMI) 37.0-37.9, adult: Secondary | ICD-10-CM | POA: Diagnosis not present

## 2018-09-17 DIAGNOSIS — I10 Essential (primary) hypertension: Secondary | ICD-10-CM | POA: Diagnosis not present

## 2018-09-17 DIAGNOSIS — I129 Hypertensive chronic kidney disease with stage 1 through stage 4 chronic kidney disease, or unspecified chronic kidney disease: Secondary | ICD-10-CM | POA: Diagnosis not present

## 2018-09-17 DIAGNOSIS — I5022 Chronic systolic (congestive) heart failure: Secondary | ICD-10-CM | POA: Diagnosis not present

## 2018-09-17 DIAGNOSIS — E7849 Other hyperlipidemia: Secondary | ICD-10-CM | POA: Diagnosis not present

## 2018-09-17 DIAGNOSIS — R82998 Other abnormal findings in urine: Secondary | ICD-10-CM | POA: Diagnosis not present

## 2018-09-21 DIAGNOSIS — Z1212 Encounter for screening for malignant neoplasm of rectum: Secondary | ICD-10-CM | POA: Diagnosis not present

## 2018-12-17 DIAGNOSIS — I10 Essential (primary) hypertension: Secondary | ICD-10-CM | POA: Diagnosis not present

## 2018-12-17 DIAGNOSIS — I4891 Unspecified atrial fibrillation: Secondary | ICD-10-CM | POA: Diagnosis not present

## 2018-12-17 DIAGNOSIS — E7800 Pure hypercholesterolemia, unspecified: Secondary | ICD-10-CM | POA: Diagnosis not present

## 2018-12-17 DIAGNOSIS — E668 Other obesity: Secondary | ICD-10-CM | POA: Diagnosis not present

## 2019-02-15 DIAGNOSIS — M859 Disorder of bone density and structure, unspecified: Secondary | ICD-10-CM | POA: Diagnosis not present

## 2019-02-24 ENCOUNTER — Other Ambulatory Visit: Payer: Self-pay | Admitting: Cardiology

## 2019-04-01 DIAGNOSIS — M8580 Other specified disorders of bone density and structure, unspecified site: Secondary | ICD-10-CM | POA: Diagnosis not present

## 2019-04-01 DIAGNOSIS — I2721 Secondary pulmonary arterial hypertension: Secondary | ICD-10-CM | POA: Diagnosis not present

## 2019-04-01 DIAGNOSIS — I130 Hypertensive heart and chronic kidney disease with heart failure and stage 1 through stage 4 chronic kidney disease, or unspecified chronic kidney disease: Secondary | ICD-10-CM | POA: Diagnosis not present

## 2019-04-01 DIAGNOSIS — M1712 Unilateral primary osteoarthritis, left knee: Secondary | ICD-10-CM | POA: Diagnosis not present

## 2019-04-01 DIAGNOSIS — N183 Chronic kidney disease, stage 3 (moderate): Secondary | ICD-10-CM | POA: Diagnosis not present

## 2019-04-01 DIAGNOSIS — I5022 Chronic systolic (congestive) heart failure: Secondary | ICD-10-CM | POA: Diagnosis not present

## 2019-04-01 DIAGNOSIS — R269 Unspecified abnormalities of gait and mobility: Secondary | ICD-10-CM | POA: Diagnosis not present

## 2019-04-01 DIAGNOSIS — I480 Paroxysmal atrial fibrillation: Secondary | ICD-10-CM | POA: Diagnosis not present

## 2019-04-05 DIAGNOSIS — E038 Other specified hypothyroidism: Secondary | ICD-10-CM | POA: Diagnosis not present

## 2019-04-08 ENCOUNTER — Other Ambulatory Visit: Payer: Self-pay | Admitting: Orthopaedic Surgery

## 2019-04-08 ENCOUNTER — Telehealth: Payer: Self-pay | Admitting: Orthopaedic Surgery

## 2019-04-08 MED ORDER — AMOXICILLIN 500 MG PO CAPS: 2000.0000 mg | ORAL_CAPSULE | Freq: Once | ORAL | 6 refills | Status: AC

## 2019-04-08 NOTE — Telephone Encounter (Signed)
Please advise 

## 2019-04-08 NOTE — Telephone Encounter (Signed)
Yes this will be the last time she needs to take one.  2 g amoxicillin 30 mins before procedure

## 2019-04-08 NOTE — Telephone Encounter (Signed)
Patient called advised she has a dental appointment Monday to get her teeth cleaned. Patient asked if she will need an antibiotic? The number to contact patient is [phone]

## 2019-04-08 NOTE — Telephone Encounter (Signed)
Called patient and advised.  Confirmed [facility] [name]

## 2019-04-13 ENCOUNTER — Other Ambulatory Visit: Payer: Self-pay | Admitting: Physician Assistant

## 2019-04-13 ENCOUNTER — Telehealth: Payer: Self-pay | Admitting: Orthopaedic Surgery

## 2019-04-13 MED ORDER — AMOXICILLIN 500 MG PO CAPS: ORAL_CAPSULE | ORAL | 2 refills | Status: DC

## 2019-04-13 NOTE — Telephone Encounter (Signed)
Needs to take each time she goes to dentist.  I just sent in more pills

## 2019-04-13 NOTE — Telephone Encounter (Signed)
Please advise 

## 2019-04-13 NOTE — Telephone Encounter (Signed)
Patient called to get antibiotic earlier this week for teeth cleaning. Took all 4 for appt on Monday. On Thursday she is going back to have a filling.   He is wanting to know if she has to get/take another 4?  Please call patient to advise.  [phone]

## 2019-04-13 NOTE — Telephone Encounter (Signed)
Called patient to advise on message.  

## 2019-05-07 ENCOUNTER — Ambulatory Visit: Payer: Self-pay | Admitting: Orthopaedic Surgery

## 2019-07-06 ENCOUNTER — Ambulatory Visit (INDEPENDENT_AMBULATORY_CARE_PROVIDER_SITE_OTHER): Payer: PPO

## 2019-07-06 ENCOUNTER — Encounter: Payer: Self-pay | Admitting: Orthopaedic Surgery

## 2019-07-06 ENCOUNTER — Other Ambulatory Visit: Payer: Self-pay

## 2019-07-06 ENCOUNTER — Ambulatory Visit (INDEPENDENT_AMBULATORY_CARE_PROVIDER_SITE_OTHER): Payer: PPO | Admitting: Orthopaedic Surgery

## 2019-07-06 DIAGNOSIS — Z96652 Presence of left artificial knee joint: Secondary | ICD-10-CM

## 2019-07-06 NOTE — Progress Notes (Signed)
     Patient: [name]           Date of Birth: Apr 18, 1946           MRN: [id number] Visit Date: 07/06/2019 PCP: Shon Baton, MD   Assessment & Plan:  Chief Complaint:  Chief Complaint  Patient presents with  . Left Knee - Follow-up   Visit Diagnoses:  1. Status post total left knee replacement     Plan: Patient is a pleasant 73 year old female who presents our clinic today 2 years status post left total knee replacement.  She is doing well.  She has no complaints.  Examination of her left knee reveals a fully healed scar.  Range of motion 0 to 120 degrees.  She is stable valgus varus stress.  She is neurovascular intact distally.  At this point, she will continue to advance with activities as tolerated.  She will follow-up with [name] in 1 to 2 years time for repeat evaluation and 2 view x-rays of the left knee.  Call with concerns or questions in the meantime.  Follow-Up Instructions: Return in about 1 year (around 07/05/2020).   Orders:  Orders Placed This Encounter  Procedures  . XR Knee 1-2 Views Left   No orders of the defined types were placed in this encounter.   Imaging: Xr Knee 1-2 Views Left  Result Date: 07/06/2019 Well-seated prosthesis without complication   PMFS History: Patient Active Problem List   Diagnosis Date Noted  . Total knee replacement status 05/29/2017   Past Medical History:  Diagnosis Date  . Arthritis   . Dysrhythmia    new onset afib   . High cholesterol   . Hyperlipidemia   . Hypertension   . Hypothyroidism     Family History  Problem Relation Age of Onset  . Colon cancer Neg Hx   . Stomach cancer Neg Hx     Past Surgical History:  Procedure Laterality Date  . BACK SURGERY  2012  . CARDIOVASCULAR STRESS TEST    . COLONOSCOPY    . TOTAL KNEE ARTHROPLASTY Left 05/29/2017   Procedure: LEFT TOTAL KNEE ARTHROPLASTY;  Surgeon: Leandrew Koyanagi, MD;  Location: [facility];  Service: Orthopedics;  Laterality: Left;  . TUBAL LIGATION  1977  .  US ECHOCARDIOGRAPHY     Social History   Occupational History  . Not on file  Tobacco Use  . Smoking status: Never Smoker  . Smokeless tobacco: Never Used  Substance and Sexual Activity  . Alcohol use: No  . Drug use: No  . Sexual activity: Not on file

## 2019-07-09 ENCOUNTER — Other Ambulatory Visit: Payer: Self-pay

## 2019-07-13 ENCOUNTER — Other Ambulatory Visit: Payer: Self-pay

## 2019-07-13 MED ORDER — APIXABAN 5 MG PO TABS: 5.0000 mg | ORAL_TABLET | Freq: Two times a day (BID) | ORAL | 1 refills | Status: DC

## 2019-09-14 DIAGNOSIS — E559 Vitamin D deficiency, unspecified: Secondary | ICD-10-CM | POA: Diagnosis not present

## 2019-09-14 DIAGNOSIS — I10 Essential (primary) hypertension: Secondary | ICD-10-CM | POA: Diagnosis not present

## 2019-09-14 DIAGNOSIS — E039 Hypothyroidism, unspecified: Secondary | ICD-10-CM | POA: Diagnosis not present

## 2019-09-17 DIAGNOSIS — I10 Essential (primary) hypertension: Secondary | ICD-10-CM | POA: Diagnosis not present

## 2019-09-21 DIAGNOSIS — I129 Hypertensive chronic kidney disease with stage 1 through stage 4 chronic kidney disease, or unspecified chronic kidney disease: Secondary | ICD-10-CM | POA: Diagnosis not present

## 2019-09-21 DIAGNOSIS — Z0000 Encounter for general adult medical examination without abnormal findings: Secondary | ICD-10-CM | POA: Diagnosis not present

## 2019-09-21 DIAGNOSIS — M8580 Other specified disorders of bone density and structure, unspecified site: Secondary | ICD-10-CM | POA: Diagnosis not present

## 2019-09-21 DIAGNOSIS — E785 Hyperlipidemia, unspecified: Secondary | ICD-10-CM | POA: Diagnosis not present

## 2019-09-21 DIAGNOSIS — Z1331 Encounter for screening for depression: Secondary | ICD-10-CM | POA: Diagnosis not present

## 2019-09-21 DIAGNOSIS — K635 Polyp of colon: Secondary | ICD-10-CM | POA: Diagnosis not present

## 2019-09-21 DIAGNOSIS — I5022 Chronic systolic (congestive) heart failure: Secondary | ICD-10-CM | POA: Diagnosis not present

## 2019-09-21 DIAGNOSIS — R269 Unspecified abnormalities of gait and mobility: Secondary | ICD-10-CM | POA: Diagnosis not present

## 2019-09-21 DIAGNOSIS — R809 Proteinuria, unspecified: Secondary | ICD-10-CM | POA: Diagnosis not present

## 2019-09-21 DIAGNOSIS — Z1212 Encounter for screening for malignant neoplasm of rectum: Secondary | ICD-10-CM | POA: Diagnosis not present

## 2019-09-21 DIAGNOSIS — I2721 Secondary pulmonary arterial hypertension: Secondary | ICD-10-CM | POA: Diagnosis not present

## 2019-09-21 DIAGNOSIS — R739 Hyperglycemia, unspecified: Secondary | ICD-10-CM | POA: Diagnosis not present

## 2019-09-21 DIAGNOSIS — I480 Paroxysmal atrial fibrillation: Secondary | ICD-10-CM | POA: Diagnosis not present

## 2019-09-21 DIAGNOSIS — N1832 Chronic kidney disease, stage 3b: Secondary | ICD-10-CM | POA: Diagnosis not present

## 2019-09-28 DIAGNOSIS — Z1231 Encounter for screening mammogram for malignant neoplasm of breast: Secondary | ICD-10-CM | POA: Diagnosis not present

## 2019-11-15 ENCOUNTER — Encounter: Payer: Self-pay | Admitting: Internal Medicine

## 2019-12-23 ENCOUNTER — Encounter: Payer: Self-pay | Admitting: Internal Medicine

## 2019-12-23 ENCOUNTER — Ambulatory Visit: Payer: PPO | Admitting: Internal Medicine

## 2019-12-23 VITALS — BP 102/62 | HR 92 | Temp 98.2°F | Ht 64.25 in | Wt 237.5 lb

## 2019-12-23 DIAGNOSIS — Z86010 Personal history of colonic polyps: Secondary | ICD-10-CM

## 2019-12-23 DIAGNOSIS — Z7901 Long term (current) use of anticoagulants: Secondary | ICD-10-CM

## 2019-12-23 DIAGNOSIS — I482 Chronic atrial fibrillation, unspecified: Secondary | ICD-10-CM | POA: Diagnosis not present

## 2019-12-23 DIAGNOSIS — R195 Other fecal abnormalities: Secondary | ICD-10-CM

## 2019-12-23 DIAGNOSIS — Z01818 Encounter for other preprocedural examination: Secondary | ICD-10-CM

## 2019-12-23 MED ORDER — NA SULFATE-K SULFATE-MG SULF 17.5-3.13-1.6 GM/177ML PO SOLN: 1.0000 | Freq: Once | ORAL | 0 refills | Status: AC

## 2019-12-23 NOTE — Patient Instructions (Signed)
You have been scheduled for an endoscopy and colonoscopy. Please follow the written instructions given to you at your visit today. Please pick up your prep supplies at the pharmacy within the next 1-3 days. If you use inhalers (even only as needed), please bring them with you on the day of your procedure.  

## 2019-12-23 NOTE — Progress Notes (Signed)
HISTORY OF PRESENT ILLNESS:  [name] is a 74 y.o. female with multiple medical problems including hypertension, hyperlipidemia, obesity, renal insufficiency, atrial fibrillation, congestive heart failure (estimated ejection fraction 40 and 52%), and chronic anticoagulation.  She is sent by her primary care provider Dr. Virgina Jock regarding Hemoccult positive stool.  I have reviewed his office note in detail as well as her relevant cardiac history and laboratories.  Anticipation of knee surgery the patient was found to be in atrial fibrillation.  She was treated medically including Eliquis therapy.  She subsequently underwent knee replacement surgery off Eliquis.  No history of CVA.  Review of her outside records shows Hemoccult positive stool on outpatient testing September 21, 2019.  At that time normal hemoglobin of 15.1.  The patient has undergone colonoscopy in 2003 (Dr. Earlean Shawl, normal exam) and September 2013 (here with me) which revealed 3 diminutive tubular adenomas and mild diverticulosis.  Follow-up in 5 years recommended.  Patient has not followed up until this time.  She is accompanied by her husband.  She ambulates around the house without assistance though will use a walker when she is out in public.  Her GI review of systems is remarkable for occasional heartburn and urgency with loose stools.  No melena or hematochezia.  No weight loss.  She is not on PPI.  REVIEW OF SYSTEMS:  All non-GI ROS negative unless otherwise stated in the HPI except for sinus and allergy trouble, back pain, arthritis, irregular heartbeat  Past Medical History:  Diagnosis Date  . Arthritis   . Atrial fibrillation ([place])   . CHF (congestive heart failure) ([place])   . CKD (chronic kidney disease), stage III   . Colon polyps   . Dysrhythmia    new onset afib   . Goiter   . Hyperglycemia   . Hyperlipidemia   . Hypertension   . Hypothyroidism   . Morbid obesity ([place])   . Osteopenia     Past Surgical History:   Procedure Laterality Date  . CARDIOVASCULAR STRESS TEST    . COLONOSCOPY    . [place] SURGERY  2012  . TOTAL KNEE ARTHROPLASTY Left 05/29/2017   Procedure: LEFT TOTAL KNEE ARTHROPLASTY;  Surgeon: Leandrew Koyanagi, MD;  Location: [facility];  Service: Orthopedics;  Laterality: Left;  . TUBAL LIGATION  1977  . US ECHOCARDIOGRAPHY      Social History Daniyla A Green  reports that she has never smoked. She has never used smokeless tobacco. She reports that she does not drink alcohol or use drugs.  family history includes CVA in her mother; Heart disease in her brother and father.  Allergies  Allergen Reactions  . No Known Allergies        PHYSICAL EXAMINATION: Vital signs: BP 102/62 (BP Location: Left Arm, Patient Position: Sitting, Cuff Size: Normal)   Pulse 92   Temp 98.2 F (36.8 C)   Ht 5' 4.25" (1.632 m) Comment: height measured without shoes  Wt 237 lb 8 oz (107.7 kg)   BMI 40.45 kg/m   Constitutional: Pleasant, unhealthy appearing, no acute distress Psychiatric: alert and oriented x3, cooperative Eyes: extraocular movements intact, anicteric, conjunctiva pink Mouth: oral pharynx moist, no lesions Neck: supple no lymphadenopathy Cardiovascular: Irregularly irregular rate and rhythm Lungs: clear to auscultation bilaterally Abdomen: soft, obese, nontender, nondistended, no obvious ascites, no peritoneal signs, normal bowel sounds, no organomegaly Rectal: Deferred to colonoscopy Extremities: no clubbing or cyanosis.  1+ lower extremity edema bilaterally Skin: no lesions on  visible extremities Neuro: No focal deficits.  Cranial nerves intact  ASSESSMENT:  1.  Hemoccult positive stool.  Normal hemoglobin.  Rule out occult GI mucosal lesion. 2.  Occasional GERD symptoms 3.  Personal history of multiple adenomas.  2013.  Overdue for follow-up 4.  Multiple significant medical problems including chronic atrial fibrillation on Eliquis   PLAN:  1.  Colonoscopy and upper  endoscopy to evaluate Hemoccult-positive stool in an elderly patient with multiple medical problems on Eliquis.  Patient is HIGH RISK given her comorbidities and the need to address chronic anticoagulation therapy.The nature of the procedure, as well as the risks, benefits, and alternatives were carefully and thoroughly reviewed with the patient. Ample time for discussion and questions allowed. The patient understood, was satisfied, and agreed to proceed. 2.  Hold Eliquis 3 days prior to the procedures (renal insufficiency).  We will check with her cardiologist Dr. Karin Lieu to see if this is acceptable.  We would anticipate resuming her anticoagulation therapy the day of the procedure, unless significant therapeutics required.  60 minutes was spent preparing to see the patient, reviewing records and laboratories, obtaining the medical history, performing the physical examination, counseling and educating the patient regarding her condition, discussing the planned procedures, addressing issues regarding her anticoagulation, corresponding with her cardiologist, and documenting the clinical information in the health record.

## 2019-12-24 ENCOUNTER — Encounter: Payer: Self-pay | Admitting: Cardiology

## 2019-12-24 ENCOUNTER — Other Ambulatory Visit: Payer: Self-pay

## 2019-12-24 ENCOUNTER — Telehealth: Payer: Self-pay

## 2019-12-24 ENCOUNTER — Ambulatory Visit (INDEPENDENT_AMBULATORY_CARE_PROVIDER_SITE_OTHER): Payer: PPO | Admitting: Cardiology

## 2019-12-24 VITALS — BP 117/58 | HR 80 | Temp 97.6°F | Resp 12 | Ht 64.0 in | Wt 235.9 lb

## 2019-12-24 DIAGNOSIS — I10 Essential (primary) hypertension: Secondary | ICD-10-CM | POA: Diagnosis not present

## 2019-12-24 DIAGNOSIS — I4819 Other persistent atrial fibrillation: Secondary | ICD-10-CM | POA: Insufficient documentation

## 2019-12-24 DIAGNOSIS — I4811 Longstanding persistent atrial fibrillation: Secondary | ICD-10-CM

## 2019-12-24 DIAGNOSIS — Z01818 Encounter for other preprocedural examination: Secondary | ICD-10-CM

## 2019-12-24 NOTE — Telephone Encounter (Signed)
Patient aware to hold Elilquis three days

## 2019-12-24 NOTE — Telephone Encounter (Signed)
Lm on vm 

## 2019-12-24 NOTE — Progress Notes (Signed)
Subjective:   [name], female    DOB: [birth year], 74 y.o.   MRN: [id number]   Chief Complaint  Patient presents with  . Pre-op Exam   HPI  74 year old Caucasian female with hypertension, persistent atrial fibrillation, moderate obesity.  She was found to have occult blood in stools, by PCP. She is referred for colonoscopy.   She is otherwise doing well. She denies chest pain, shortness of breath, palpitations, leg edema, orthopnea, PND, TIA/syncope.   Current Outpatient Medications on File Prior to Visit  Medication Sig Dispense Refill  . acetaminophen (TYLENOL) 325 MG tablet Take 650 mg by mouth as needed (for pain.).     Marland Kitchen amLODipine (NORVASC) 10 MG tablet Take 10 mg by mouth daily at 3 pm.     . apixaban (ELIQUIS) 5 MG TABS tablet Take 1 tablet (5 mg total) by mouth 2 (two) times daily. 180 tablet 1  . atenolol (TENORMIN) 100 MG tablet Take 100 mg by mouth daily.     . Calcium Carbonate-Vit D-Min (CALCIUM 600+D PLUS MINERALS PO) Take 1 tablet by mouth daily at 3 pm.     . furosemide (LASIX) 40 MG tablet Take 20 mg by mouth daily.     Marland Kitchen levothyroxine (SYNTHROID, LEVOTHROID) 125 MCG tablet Take 125 mcg by mouth daily.     Marland Kitchen losartan (COZAAR) 25 MG tablet Take 25 mg by mouth daily.     . pravastatin (PRAVACHOL) 80 MG tablet Take 80 mg by mouth daily at 3 pm.      No current facility-administered medications on file prior to visit.    Cardiovascular and other pertinent studies:  EKG 12/24/2019: Atrial fibrillation 94 bpm.  Echocardiogram 04/30/2017: Poor echo window. Wall motion abnormality has reduced sensitivity. Left ventricle cavity is normal in size. Mild decrease in LV systolic function. Global hypokinesis. Visual EF is 40-45%. Unable to evaluate diastolic function due to A. Fibrillation. Calculated EF 52%. Left atrial cavity is moderately dilated by volume. Mild tricuspid regurgitation. Mild pulmonary hypertension. Pulmonary artery systolic pressure is  estimated at 34 mm Hg. Mild pulmonic regurgitation.   Recent labs: 09/14/2019: Glucose 108, BUN/Cr 16/1.4. EGFR ?. Na/K ?/?.  Rest of the CMP normal.  Chol 183, TG 123, HDL 47, LDL 111    Review of Systems  Cardiovascular: Negative for chest pain, dyspnea on exertion, leg swelling, palpitations and syncope.       Vitals:   12/24/19 1140  BP: (!) 117/58  Pulse: 80  Resp: 12  Temp: 97.6 F (36.4 C)  SpO2: 97%     Body mass index is 40.49 kg/m. Filed Weights   12/24/19 1140  Weight: 235 lb 14.4 oz (107 kg)     Objective:   Physical Exam  Constitutional: She appears well-developed and well-nourished.  Neck: No JVD present.  Cardiovascular: Normal rate, normal heart sounds and intact distal pulses. An irregularly irregular rhythm present.  No murmur heard. Pulmonary/Chest: Effort normal and breath sounds normal. She has no wheezes. She has no rales.  Musculoskeletal:        General: No edema.  Nursing note and vitals reviewed.       Assessment & Recommendations:   74 year old Caucasian female with hypertension, persistent atrial fibrillation, moderate obesity.  Peristent Afib: Rate controlled, asymptomatic. CHA2DS2VASc score 3: Annual stroke risk 4% Continue eliquis 5 mg bid. Continue atenolol 100 mg daily. Okay to proceed with endoscopy with low cardiac risk. Okay to hold eliquis 3 days before procedure, and  start as soon as possible, after the procedure.   Hypertension: Controlled on current antihypertensive therapy. No change made today.  Return visit in 1 year.    J , MD [facility] Pager: [phone] Office: [phone] 

## 2019-12-27 ENCOUNTER — Ambulatory Visit (INDEPENDENT_AMBULATORY_CARE_PROVIDER_SITE_OTHER): Payer: PPO

## 2019-12-27 ENCOUNTER — Telehealth: Payer: Self-pay

## 2019-12-27 DIAGNOSIS — Z1159 Encounter for screening for other viral diseases: Secondary | ICD-10-CM | POA: Diagnosis not present

## 2019-12-27 NOTE — Telephone Encounter (Signed)
-----   Message from Irene Shipper, MD sent at 12/24/2019  5:10 PM EST ----- Thank you! Jac Canavan see note.  Communicate with patient.  ThanksJP  ----- Message ----- From: Nigel Mormon, MD Sent: 12/24/2019   3:33 PM EST To: Irene Shipper, MD

## 2019-12-27 NOTE — Telephone Encounter (Signed)
Patient aware to hold Eliquis for three days

## 2019-12-28 LAB — SARS CORONAVIRUS 2 (TAT 6-24 HRS): SARS Coronavirus 2: NEGATIVE

## 2019-12-29 ENCOUNTER — Encounter: Payer: Self-pay | Admitting: Internal Medicine

## 2019-12-29 ENCOUNTER — Ambulatory Visit (AMBULATORY_SURGERY_CENTER): Payer: PPO | Admitting: Internal Medicine

## 2019-12-29 ENCOUNTER — Other Ambulatory Visit: Payer: Self-pay

## 2019-12-29 VITALS — BP 113/66 | HR 88 | Temp 97.1°F | Resp 21 | Ht 64.0 in | Wt 237.0 lb

## 2019-12-29 DIAGNOSIS — R195 Other fecal abnormalities: Secondary | ICD-10-CM | POA: Diagnosis not present

## 2019-12-29 DIAGNOSIS — D123 Benign neoplasm of transverse colon: Secondary | ICD-10-CM | POA: Diagnosis not present

## 2019-12-29 DIAGNOSIS — K2970 Gastritis, unspecified, without bleeding: Secondary | ICD-10-CM

## 2019-12-29 DIAGNOSIS — Z86010 Personal history of colonic polyps: Secondary | ICD-10-CM

## 2019-12-29 DIAGNOSIS — D124 Benign neoplasm of descending colon: Secondary | ICD-10-CM

## 2019-12-29 DIAGNOSIS — D122 Benign neoplasm of ascending colon: Secondary | ICD-10-CM | POA: Diagnosis not present

## 2019-12-29 DIAGNOSIS — K209 Esophagitis, unspecified without bleeding: Secondary | ICD-10-CM

## 2019-12-29 HISTORY — PX: UPPER GASTROINTESTINAL ENDOSCOPY: SHX188

## 2019-12-29 MED ORDER — OMEPRAZOLE 20 MG PO CPDR: 20.0000 mg | DELAYED_RELEASE_CAPSULE | Freq: Every day | ORAL | 11 refills | Status: DC

## 2019-12-29 MED ORDER — SODIUM CHLORIDE 0.9 % IV SOLN: 500.0000 mL | Freq: Once | INTRAVENOUS | Status: DC

## 2019-12-29 NOTE — Progress Notes (Signed)
Called to room to assist during endoscopic procedure.  Patient ID and intended procedure confirmed with present staff. Received instructions for my participation in the procedure from the performing physician.  

## 2019-12-29 NOTE — Op Note (Signed)
[facility] Patient Name: [name] Procedure Date: 12/29/2019 2:59 PM MRN: [id number] Endoscopist: Docia Chuck. Henrene Pastor , MD Age: 74 Referring MD:  Date of Birth: Apr 23, 1946 Gender: Female Account #: [id number] Procedure:                Upper GI endoscopy with biopsy these Indications:              Heme positive stool Medicines:                Monitored Anesthesia Care Procedure:                Pre-Anesthesia Assessment:                           - Prior to the procedure, a History and Physical                            was performed, and patient medications and                            allergies were reviewed. The patient's tolerance of                            previous anesthesia was also reviewed. The risks                            and benefits of the procedure and the sedation                            options and risks were discussed with the patient.                            All questions were answered, and informed consent                            was obtained. Prior Anticoagulants: The patient has                            taken Eliquis (apixaban), last dose was 3 days                            prior to procedure. ASA Grade Assessment: III - A                            patient with severe systemic disease. After                            reviewing the risks and benefits, the patient was                            deemed in satisfactory condition to undergo the                            procedure.  After obtaining informed consent, the endoscope was                            passed under direct vision. Throughout the                            procedure, the patient's blood pressure, pulse, and                            oxygen saturations were monitored continuously. The                            Endoscope was introduced through the mouth, and                            advanced to the second part of duodenum. The upper                       GI endoscopy was accomplished without difficulty.                            The patient tolerated the procedure well. Scope In: Scope Out: Findings:                 The esophagus revealed mild esophagitis.                           The stomach revealed antral erythema which was                            nonspecific. Biopsies were taken with a cold                            forceps for Helicobacter pylori testing using                            CLOtest.                           The examined duodenum revealed duodenitis with                            erythema, edema, and a rare erosion.                           The cardia and gastric fundus were normal on                            retroflexion except for the presence of a sliding                            hiatal hernia. Complications:            No immediate complications. Estimated Blood Loss:     Estimated blood loss: none. Impression:               1. Mild esophagitis  2. Gastroduodenitis status post CLO biopsy                           3. Otherwise normal exam. Recommendation:           1. Resume previous diet                           2. Prescribe omeprazole 20 mg daily; #30; take 1 by                            mouth daily. This will protect your upper GI tract                            against ulcer formation and bleeding, particularly                            since you are on Eliquis                           3. Resume Eliquis tomorrow                           4. Follow-up CLO biopsy                           5. Return to the care of Dr. Virgina Jock. Docia Chuck. Henrene Pastor, MD 12/29/2019 3:43:43 PM This report has been signed electronically.

## 2019-12-29 NOTE — Progress Notes (Signed)
A and O x3. Report to RN. Tolerated MAC anesthesia well.Teeth unchanged after procedure.

## 2019-12-29 NOTE — Patient Instructions (Signed)
Resume Eliquis tomorrow.  Prescription for omeprazole has been sent to your pharmacy.     YOU HAD AN ENDOSCOPIC PROCEDURE TODAY AT [facility]:   Refer to the procedure report that was given to you for any specific questions about what was found during the examination.  If the procedure report does not answer your questions, please call your gastroenterologist to clarify.  If you requested that your care partner not be given the details of your procedure findings, then the procedure report has been included in a sealed envelope for you to review at your convenience later.  YOU SHOULD EXPECT: Some feelings of bloating in the abdomen. Passage of more gas than usual.  Walking can help get rid of the air that was put into your GI tract during the procedure and reduce the bloating. If you had a lower endoscopy (such as a colonoscopy or flexible sigmoidoscopy) you may notice spotting of blood in your stool or on the toilet paper. If you underwent a bowel prep for your procedure, you may not have a normal bowel movement for a few days.  Please Note:  You might notice some irritation and congestion in your nose or some drainage.  This is from the oxygen used during your procedure.  There is no need for concern and it should clear up in a day or so.  SYMPTOMS TO REPORT IMMEDIATELY:   Following lower endoscopy (colonoscopy or flexible sigmoidoscopy):  Excessive amounts of blood in the stool  Significant tenderness or worsening of abdominal pains  Swelling of the abdomen that is new, acute  Fever of 100F or higher   Following upper endoscopy (EGD)  Vomiting of blood or coffee ground material  New chest pain or pain under the shoulder blades  Painful or persistently difficult swallowing  New shortness of breath  Fever of 100F or higher  Black, tarry-looking stools  For urgent or emergent issues, a gastroenterologist can be reached at any hour by calling [phone].   DIET:   We do recommend a small meal at first, but then you may proceed to your regular diet.  Drink plenty of fluids but you should avoid alcoholic beverages for 24 hours.  ACTIVITY:  You should plan to take it easy for the rest of today and you should NOT DRIVE or use heavy machinery until tomorrow (because of the sedation medicines used during the test).    FOLLOW UP: Our staff will call the number listed on your records 48-72 hours following your procedure to check on you and address any questions or concerns that you may have regarding the information given to you following your procedure. If we do not reach you, we will leave a message.  We will attempt to reach you two times.  During this call, we will ask if you have developed any symptoms of COVID 19. If you develop any symptoms (ie: fever, flu-like symptoms, shortness of breath, cough etc.) before then, please call [phone].  If you test positive for Covid 19 in the 2 weeks post procedure, please call and report this information to [name].    If any biopsies were taken you will be contacted by phone or by letter within the next 1-3 weeks.  Please call us at [phone] if you have not heard about the biopsies in 3 weeks.    SIGNATURES/CONFIDENTIALITY: You and/or your care partner have signed paperwork which will be entered into your electronic medical record.  These signatures attest to  the fact that that the information above on your After Visit Summary has been reviewed and is understood.  Full responsibility of the confidentiality of this discharge information lies with you and/or your care-partner.   Thank you for allowing [name] to provide your healthcare today.

## 2019-12-29 NOTE — Progress Notes (Signed)
Temp JB Vitals KA

## 2019-12-29 NOTE — Op Note (Signed)
[facility] Patient Name: [name] Procedure Date: 12/29/2019 2:59 PM MRN: [id number] Endoscopist: Docia Chuck. Henrene Pastor , MD Age: 74 Referring MD:  Date of Birth: 01/12/1946 Gender: Female Account #: [id number] Procedure:                Colonoscopy with cold snare polypectomy x 5 Indications:              Heme positive stool. History of adenomatous colon                            polyps. Previous examinations 2003 (no polyps) in                            2013 (3 adenomas). Medicines:                Monitored Anesthesia Care Procedure:                Pre-Anesthesia Assessment:                           - Prior to the procedure, a History and Physical                            was performed, and patient medications and                            allergies were reviewed. The patient's tolerance of                            previous anesthesia was also reviewed. The risks                            and benefits of the procedure and the sedation                            options and risks were discussed with the patient.                            All questions were answered, and informed consent                            was obtained. Prior Anticoagulants: The patient has                            taken Eliquis (apixaban), last dose was 3 days                            prior to procedure. ASA Grade Assessment: III - A                            patient with severe systemic disease. After                            reviewing the risks and benefits, the patient was  deemed in satisfactory condition to undergo the                            procedure.                           After obtaining informed consent, the colonoscope                            was passed under direct vision. Throughout the                            procedure, the patient's blood pressure, pulse, and                            oxygen saturations were monitored continuously.  The                            Colonoscope was introduced through the anus and                            advanced to the the cecum, identified by                            appendiceal orifice and ileocecal valve. The                            ileocecal valve, appendiceal orifice, and rectum                            were photographed. The quality of the bowel                            preparation was excellent. The colonoscopy was                            performed without difficulty. The patient tolerated                            the procedure well. The bowel preparation used was                            SUPREP via split dose instruction. Scope In: 3:11:36 PM Scope Out: 3:26:44 PM Scope Withdrawal Time: 0 hours 11 minutes 55 seconds  Total Procedure Duration: 0 hours 15 minutes 8 seconds  Findings:                 Five polyps were found in the descending colon,                            transverse colon and ascending colon. The polyps                            were 1 to 12 mm in size. These polyps were removed  with a cold snare. Resection and retrieval were                            complete.                           Multiple diverticula were found in the left colon.                           Internal hemorrhoids were found during retroflexion.                           The exam was otherwise without abnormality on                            direct and retroflexion views. Complications:            No immediate complications. Estimated blood loss:                            None. Estimated Blood Loss:     Estimated blood loss: none. Impression:               - Five 1 to 12 mm polyps in the descending colon,                            in the transverse colon and in the ascending colon,                            removed with a cold snare. Resected and retrieved.                           - Diverticulosis in the left colon.                            - Internal hemorrhoids.                           - The examination was otherwise normal on direct                            and retroflexion views. Recommendation:           - Repeat colonoscopy in 3 years for surveillance if                            medically fit and willing.                           - Resume Coumadin (warfarin) tomorrow and Eliquis                            (apixaban) today at prior doses.                           - Patient has a contact number available for  emergencies. The signs and symptoms of potential                            delayed complications were discussed with the                            patient. Return to normal activities tomorrow.                            Written discharge instructions were provided to the                            patient.                           - Resume previous diet.                           - Continue present medications.                           - Await pathology results. Docia Chuck. Henrene Pastor, MD 12/29/2019 3:33:29 PM This report has been signed electronically.

## 2019-12-30 LAB — HELICOBACTER PYLORI SCREEN-BIOPSY: UREASE: NEGATIVE

## 2019-12-31 ENCOUNTER — Telehealth: Payer: Self-pay | Admitting: *Deleted

## 2019-12-31 NOTE — Telephone Encounter (Signed)
  Follow up Call-  Call back number 12/29/2019  Post procedure Call Back phone  # [phone]  Permission to leave phone message Yes  Some recent data might be hidden     Patient questions:  Do you have a fever, pain , or abdominal swelling? No. Pain Score  0 *  Have you tolerated food without any problems? Yes.    Have you been able to return to your normal activities? Yes.    Do you have any questions about your discharge instructions: Diet   No. Medications  No. Follow up visit  No.  Do you have questions or concerns about your Care? No.  Actions: * If pain score is 4 or above: No action needed, pain <4.  1. Have you developed a fever since your procedure? no  2.   Have you had an respiratory symptoms (SOB or cough) since your procedure? no  3.   Have you tested positive for COVID 19 since your procedure no  4.   Have you had any family members/close contacts diagnosed with the COVID 19 since your procedure?  no   If yes to any of these questions please route to Joylene John, RN and Alphonsa Gin, Therapist, sports.

## 2020-01-03 ENCOUNTER — Other Ambulatory Visit: Payer: Self-pay | Admitting: Cardiology

## 2020-01-04 ENCOUNTER — Encounter: Payer: Self-pay | Admitting: Internal Medicine

## 2020-03-09 DIAGNOSIS — H2513 Age-related nuclear cataract, bilateral: Secondary | ICD-10-CM | POA: Diagnosis not present

## 2020-03-16 ENCOUNTER — Ambulatory Visit (INDEPENDENT_AMBULATORY_CARE_PROVIDER_SITE_OTHER): Payer: PPO

## 2020-03-16 ENCOUNTER — Encounter: Payer: Self-pay | Admitting: Orthopaedic Surgery

## 2020-03-16 ENCOUNTER — Other Ambulatory Visit: Payer: Self-pay

## 2020-03-16 ENCOUNTER — Ambulatory Visit: Payer: PPO | Admitting: Orthopaedic Surgery

## 2020-03-16 DIAGNOSIS — Z96652 Presence of left artificial knee joint: Secondary | ICD-10-CM

## 2020-03-16 NOTE — Progress Notes (Signed)
Office Visit Note   Patient: [name]           Date of Birth: 1946-07-27           MRN: [id number] Visit Date: 03/16/2020              Requested by: Shon Baton, [address] [place],  [place] [address] PCP: Shon Baton, MD   Assessment & Plan: Visit Diagnoses:  1. Status post total left knee replacement     Plan: Impression is status post left total knee replacement with questionable left lower extremity DVT.  At this point, we will order a venous Doppler ultrasound left lower extremity to rule out DVT.  We will call her with the results.  Should she develop chest pain or shortness of breath in the meantime, she has been instructed to go to the ED.  Follow-Up Instructions: No follow-ups on file.   Orders:  Orders Placed This Encounter  Procedures  . XR KNEE 3 VIEW LEFT  . VAS [name] LOWER EXTREMITY VENOUS (DVT)   No orders of the defined types were placed in this encounter.     Procedures: No procedures performed   Clinical Data: No additional findings.   Subjective: Chief Complaint  Patient presents with  . Left Knee - Pain    HPI patient is a pleasant 74 year old female who is approximately 3 years status post left total knee replacement June 2018.  She was doing well until recently.  She noticed swelling from her knee to her ankle to the left lower extremity about a week ago.  No chest pain no shortness of breath.  No real pain to the knee.  No history of DVT or PE.  Of note, she does have congestive heart failure, A. fib and chronic kidney disease and is on 5 mg of Eliquis twice daily.  Review of Systems as detailed in HPI.  All others reviewed and are negative.   Objective: Vital Signs: There were no vitals taken for this visit.  Physical Exam well-developed well-nourished female no acute distress.  Alert and oriented x3.  Ortho Exam examination of the left knee shows range of motion from 0 to 110 degrees.  Stable valgus varus stress.  She does have  moderate tenderness and swelling to the left calf.  No erythema.  Negative Homans.  She is neurovascular intact distally.  Specialty Comments:  No specialty comments available.  Imaging: XR KNEE 3 VIEW LEFT  Result Date: 03/16/2020 Well-seated prosthesis without complication    PMFS History: Patient Active Problem List   Diagnosis Date Noted  . Essential hypertension 12/24/2019  . Longstanding persistent atrial fibrillation ([place]) 12/24/2019  . Preop examination 12/24/2019  . Total knee replacement status 05/29/2017   Past Medical History:  Diagnosis Date  . Arthritis   . Atrial fibrillation ([place])   . CHF (congestive heart failure) ([place])   . CKD (chronic kidney disease), stage III   . Colon polyps   . Dysrhythmia    new onset afib   . Goiter   . Hyperglycemia   . Hyperlipidemia   . Hypertension   . Hypothyroidism   . Morbid obesity ([place])   . Osteopenia     Family History  Problem Relation Age of Onset  . CVA Mother   . Heart disease Brother   . Heart disease Father   . Colon cancer Neg Hx   . Stomach cancer Neg Hx   . Colon polyps Neg Hx   .  Esophageal cancer Neg Hx   . Rectal cancer Neg Hx     Past Surgical History:  Procedure Laterality Date  . CARDIOVASCULAR STRESS TEST    . COLONOSCOPY    . [place] SURGERY  2012  . POLYPECTOMY    . TOTAL KNEE ARTHROPLASTY Left 05/29/2017   Procedure: LEFT TOTAL KNEE ARTHROPLASTY;  Surgeon: Leandrew Koyanagi, MD;  Location: [facility];  Service: Orthopedics;  Laterality: Left;  . TUBAL LIGATION  1977  . UPPER GASTROINTESTINAL ENDOSCOPY  12/29/2019  . US ECHOCARDIOGRAPHY     Social History   Occupational History  . Occupation: retired  Tobacco Use  . Smoking status: Never Smoker  . Smokeless tobacco: Never Used  Substance and Sexual Activity  . Alcohol use: No  . Drug use: No  . Sexual activity: Not on file

## 2020-03-20 ENCOUNTER — Ambulatory Visit (HOSPITAL_COMMUNITY)
Admission: RE | Admit: 2020-03-20 | Discharge: 2020-03-20 | Disposition: A | Discharge status: Home / Self Care | Payer: PPO | Source: Ambulatory Visit | Attending: Orthopaedic Surgery | Admitting: Orthopaedic Surgery

## 2020-03-20 ENCOUNTER — Other Ambulatory Visit: Payer: Self-pay

## 2020-03-20 DIAGNOSIS — Z96652 Presence of left artificial knee joint: Secondary | ICD-10-CM

## 2020-03-20 NOTE — Progress Notes (Signed)
Lower extremity venous has been completed.   Preliminary results in CV Proc.   [name] 03/20/2020 10:06 AM

## 2020-07-11 ENCOUNTER — Other Ambulatory Visit: Payer: Self-pay | Admitting: Cardiology

## 2020-08-10 DIAGNOSIS — E039 Hypothyroidism, unspecified: Secondary | ICD-10-CM | POA: Diagnosis not present

## 2020-08-10 DIAGNOSIS — E042 Nontoxic multinodular goiter: Secondary | ICD-10-CM | POA: Diagnosis not present

## 2020-08-10 DIAGNOSIS — R590 Localized enlarged lymph nodes: Secondary | ICD-10-CM | POA: Diagnosis not present

## 2020-08-17 ENCOUNTER — Other Ambulatory Visit: Payer: Self-pay | Admitting: Family Medicine

## 2020-08-17 DIAGNOSIS — R590 Localized enlarged lymph nodes: Secondary | ICD-10-CM

## 2020-08-24 ENCOUNTER — Ambulatory Visit
Admission: RE | Admit: 2020-08-24 | Discharge: 2020-08-24 | Disposition: A | Discharge status: Home / Self Care | Payer: PPO | Source: Ambulatory Visit | Attending: Family Medicine | Admitting: Family Medicine

## 2020-08-24 DIAGNOSIS — R590 Localized enlarged lymph nodes: Secondary | ICD-10-CM

## 2020-12-22 ENCOUNTER — Other Ambulatory Visit: Payer: Self-pay | Admitting: Internal Medicine

## 2020-12-25 ENCOUNTER — Other Ambulatory Visit: Payer: Self-pay

## 2020-12-25 ENCOUNTER — Encounter: Payer: Self-pay | Admitting: Cardiology

## 2020-12-25 ENCOUNTER — Ambulatory Visit: Payer: Medicare HMO | Admitting: Cardiology

## 2020-12-25 VITALS — BP 130/82 | HR 78 | Ht 64.0 in | Wt 230.0 lb

## 2020-12-25 DIAGNOSIS — I5033 Acute on chronic diastolic (congestive) heart failure: Secondary | ICD-10-CM | POA: Diagnosis not present

## 2020-12-25 DIAGNOSIS — I10 Essential (primary) hypertension: Secondary | ICD-10-CM | POA: Diagnosis not present

## 2020-12-25 DIAGNOSIS — I5032 Chronic diastolic (congestive) heart failure: Secondary | ICD-10-CM | POA: Insufficient documentation

## 2020-12-25 DIAGNOSIS — R600 Localized edema: Secondary | ICD-10-CM

## 2020-12-25 MED ORDER — FUROSEMIDE 40 MG PO TABS: 40.0000 mg | ORAL_TABLET | Freq: Every day | ORAL | 3 refills | Status: DC

## 2020-12-25 NOTE — Progress Notes (Signed)
Subjective:   [name], female    DOB: [birth year], 75 y.o.   MRN: [id number]   Chief Complaint  Patient presents with  . Hypertension  . Follow-up   75 year old Caucasian female with hypertension, persistent atrial fibrillation, moderate obesity.  Patient is here with her husband today. She has had episodes of heart racing and shortness of breath with activity. Her activity is mainly limited to walking inside the house. She denies chest pain.  Current Outpatient Medications on File Prior to Visit  Medication Sig Dispense Refill  . amLODipine (NORVASC) 10 MG tablet Take 10 mg by mouth daily at 3 pm.     . atenolol (TENORMIN) 100 MG tablet Take 100 mg by mouth daily.     . Calcium Carbonate-Vit D-Min (CALCIUM 600+D PLUS MINERALS PO) Take 1 tablet by mouth daily at 3 pm.     . ELIQUIS 5 MG TABS tablet TAKE 1 TABLET BY MOUTH TWICE A DAY 60 tablet 5  . furosemide (LASIX) 40 MG tablet Take 20 mg by mouth daily.     Marland Kitchen levothyroxine (SYNTHROID, LEVOTHROID) 125 MCG tablet Take 125 mcg by mouth daily.     Marland Kitchen losartan (COZAAR) 25 MG tablet Take 25 mg by mouth daily.     . pravastatin (PRAVACHOL) 80 MG tablet Take 80 mg by mouth daily at 3 pm.     . acetaminophen (TYLENOL) 325 MG tablet Take 650 mg by mouth as needed (for pain.).     Marland Kitchen omeprazole (PRILOSEC) 20 MG capsule TAKE 1 CAPSULE BY MOUTH EVERY DAY 90 capsule 1   No current facility-administered medications on file prior to visit.    Cardiovascular and other pertinent studies:  EKG 12/25/2020: Atrial fibrillation 77 bpm  Echocardiogram 04/30/2017: Poor echo window. Wall motion abnormality has reduced sensitivity. Left ventricle cavity is normal in size. Mild decrease in LV systolic function. Global hypokinesis. Visual EF is 40-45%. Unable to evaluate diastolic function due to A. Fibrillation. Calculated EF 52%. Left atrial cavity is moderately dilated by volume. Mild tricuspid regurgitation. Mild pulmonary hypertension.  Pulmonary artery systolic pressure is estimated at 34 mm Hg. Mild pulmonic regurgitation.   Recent labs: 09/14/2019: Glucose 108, BUN/Cr 16/1.4. EGFR ?. Na/K ?/?.  Rest of the CMP normal.  Chol 183, TG 123, HDL 47, LDL 111    Review of Systems  Cardiovascular: Positive for dyspnea on exertion and palpitations. Negative for chest pain, leg swelling and syncope.  Respiratory: Positive for shortness of breath.        Vitals:   12/25/20 1119  BP: 130/82  Pulse: 78  SpO2: 95%     Body mass index is 39.48 kg/m. Filed Weights   12/25/20 1119  Weight: 230 lb (104.3 kg)     Objective:   Physical Exam Vitals and nursing note reviewed.  Constitutional:      Appearance: She is well-developed.  Neck:     Vascular: No JVD.  Cardiovascular:     Rate and Rhythm: Normal rate. Rhythm irregularly irregular.     Pulses: Intact distal pulses.     Heart sounds: Normal heart sounds. No murmur heard.   Pulmonary:     Effort: Pulmonary effort is normal.     Breath sounds: Normal breath sounds. No wheezing or rales.  Musculoskeletal:     Right lower leg: Edema (2+) present.     Left lower leg: Edema (2+) present.         Assessment & Recommendations:  75 year old Caucasian female with hypertension, persistent atrial fibrillation, moderate obesity.  Leg edema: Suspect HFpEF. Will check BMP, DNP. Increase lasix to 40 mg daily. Counseled re: low salt diet and daily weight checks Will check echocardiogram  Peristent Afib: Rate controlled, asymptomatic. CHA2DS2VASc score 3: Annual stroke risk 4% Continue eliquis 5 mg bid. Continue atenolol 100 mg daily.  Hypertension: Controlled  F/u in 4 weeks  [place], MD [facility] Pager: [phone] Office: [phone]

## 2020-12-27 LAB — BRAIN NATRIURETIC PEPTIDE: BNP: 265.7 pg/mL — ABNORMAL HIGH (ref 0.0–100.0)

## 2021-01-11 ENCOUNTER — Other Ambulatory Visit: Payer: Self-pay | Admitting: Cardiology

## 2021-01-12 DIAGNOSIS — I480 Paroxysmal atrial fibrillation: Secondary | ICD-10-CM | POA: Diagnosis not present

## 2021-01-12 DIAGNOSIS — E785 Hyperlipidemia, unspecified: Secondary | ICD-10-CM | POA: Diagnosis not present

## 2021-01-12 DIAGNOSIS — Z1152 Encounter for screening for COVID-19: Secondary | ICD-10-CM | POA: Diagnosis not present

## 2021-01-12 DIAGNOSIS — R059 Cough, unspecified: Secondary | ICD-10-CM | POA: Diagnosis not present

## 2021-01-12 DIAGNOSIS — I509 Heart failure, unspecified: Secondary | ICD-10-CM | POA: Diagnosis not present

## 2021-01-12 DIAGNOSIS — I130 Hypertensive heart and chronic kidney disease with heart failure and stage 1 through stage 4 chronic kidney disease, or unspecified chronic kidney disease: Secondary | ICD-10-CM | POA: Diagnosis not present

## 2021-01-12 DIAGNOSIS — R682 Dry mouth, unspecified: Secondary | ICD-10-CM | POA: Diagnosis not present

## 2021-01-12 DIAGNOSIS — R202 Paresthesia of skin: Secondary | ICD-10-CM | POA: Diagnosis not present

## 2021-01-12 DIAGNOSIS — I5022 Chronic systolic (congestive) heart failure: Secondary | ICD-10-CM | POA: Diagnosis not present

## 2021-01-12 DIAGNOSIS — N1831 Chronic kidney disease, stage 3a: Secondary | ICD-10-CM | POA: Diagnosis not present

## 2021-01-12 DIAGNOSIS — D649 Anemia, unspecified: Secondary | ICD-10-CM | POA: Diagnosis not present

## 2021-01-16 DIAGNOSIS — N1831 Chronic kidney disease, stage 3a: Secondary | ICD-10-CM | POA: Diagnosis not present

## 2021-01-16 DIAGNOSIS — D649 Anemia, unspecified: Secondary | ICD-10-CM | POA: Diagnosis not present

## 2021-01-16 DIAGNOSIS — I509 Heart failure, unspecified: Secondary | ICD-10-CM | POA: Diagnosis not present

## 2021-01-16 DIAGNOSIS — I480 Paroxysmal atrial fibrillation: Secondary | ICD-10-CM | POA: Diagnosis not present

## 2021-01-16 DIAGNOSIS — I130 Hypertensive heart and chronic kidney disease with heart failure and stage 1 through stage 4 chronic kidney disease, or unspecified chronic kidney disease: Secondary | ICD-10-CM | POA: Diagnosis not present

## 2021-01-16 DIAGNOSIS — E785 Hyperlipidemia, unspecified: Secondary | ICD-10-CM | POA: Diagnosis not present

## 2021-01-16 DIAGNOSIS — I5022 Chronic systolic (congestive) heart failure: Secondary | ICD-10-CM | POA: Diagnosis not present

## 2021-01-18 NOTE — Progress Notes (Addendum)
Subjective:   [name], female    DOB: 04-04-1946, 75 y.o.   MRN: [id number]   Chief Complaint  Patient presents with  . Edema    On legs  . Follow-up    47 weeks   75 year old Caucasian female with hypertension, persistent atrial fibrillation, moderate obesity, HFpEF  Patient is here today with her daughter.  She has had worsening dyspnea with minimal activity.  Today, she is short of breath even at rest.  She denies any chest pain.  Also denies any fever, chills, cough.  She does have altered taste sensation, but that has been present for long time and is not immune symptom.  She was recently seen by her PCP, and was started on spironolactone 25 mg daily.  Oxygen saturation at that visit was 90%.  Oxygen saturation today on room air at rest is 84%.  She also has worsening leg edema and orthopnea.   Current Outpatient Medications on File Prior to Visit  Medication Sig Dispense Refill  . acetaminophen (TYLENOL) 325 MG tablet Take 650 mg by mouth as needed (for pain.).     Marland Kitchen amLODipine (NORVASC) 10 MG tablet Take 10 mg by mouth daily at 3 pm.     . atenolol (TENORMIN) 100 MG tablet Take 100 mg by mouth daily.     . Calcium Carbonate-Vit D-Min (CALCIUM 600+D PLUS MINERALS PO) Take 1 tablet by mouth daily at 3 pm.     . ELIQUIS 5 MG TABS tablet TAKE 1 TABLET BY MOUTH TWICE A DAY 60 tablet 5  . furosemide (LASIX) 40 MG tablet Take 1 tablet (40 mg total) by mouth daily. 30 tablet 3  . levothyroxine (SYNTHROID, LEVOTHROID) 125 MCG tablet Take 125 mcg by mouth daily.     Marland Kitchen losartan (COZAAR) 25 MG tablet Take 25 mg by mouth daily.     Marland Kitchen omeprazole (PRILOSEC) 20 MG capsule TAKE 1 CAPSULE BY MOUTH EVERY DAY 90 capsule 1  . pravastatin (PRAVACHOL) 80 MG tablet Take 80 mg by mouth daily at 3 pm.      No current facility-administered medications on file prior to visit.    Cardiovascular and other pertinent studies:  CXT 01/12/2019: Slowly resolving congestive failure  pattern. Possible superimposed pneumonia in LLL  EKG 12/25/2020: Atrial fibrillation 77 bpm  Echocardiogram 04/30/2017: Poor echo window. Wall motion abnormality has reduced sensitivity. Left ventricle cavity is normal in size. Mild decrease in LV systolic function. Global hypokinesis. Visual EF is 40-45%. Unable to evaluate diastolic function due to A. Fibrillation. Calculated EF 52%. Left atrial cavity is moderately dilated by volume. Mild tricuspid regurgitation. Mild pulmonary hypertension. Pulmonary artery systolic pressure is estimated at 34 mm Hg. Mild pulmonic regurgitation.   Recent labs: 01/12/2021: Glucose 67, BUN/Cr 26/1.2. EGFR 43. Na/K 134/4.4. Rest of the CMP normal  12/25/2020: BNP 265  09/14/2019: Glucose 108, BUN/Cr 16/1.4. EGFR ?. Na/K ?/?.  Rest of the CMP normal.  Chol 183, TG 123, HDL 47, LDL 111    Review of Systems  Cardiovascular: Positive for dyspnea on exertion and palpitations. Negative for chest pain, leg swelling and syncope.  Respiratory: Positive for shortness of breath.        Vitals:   01/22/21 1342  BP: (!) 109/53  Pulse: 71  Resp: 16  Temp: (!) 96.7 F (35.9 C)  SpO2: (!) 85%     Body mass index is 39.48 kg/m. Weight not measured today, as patient could not stand up on the  weighing scale  Objective:   Physical Exam Vitals and nursing note reviewed.  Constitutional:      Appearance: She is well-developed.  Neck:     Vascular: No JVD.  Cardiovascular:     Rate and Rhythm: Normal rate. Rhythm irregularly irregular.     Pulses: Intact distal pulses.     Heart sounds: Normal heart sounds. No murmur heard.   Pulmonary:     Effort: Respiratory distress (Mild) present.     Breath sounds: Rales (L>R) present. No wheezing.  Musculoskeletal:     Right lower leg: Edema (2+) present.     Left lower leg: Edema (2+) present.         Assessment & Recommendations:   75 year old Caucasian female with hypertension, persistent  atrial fibrillation, moderate obesity, HFpEF  Acute on chronic HFpEF:. Progressive worsening, now NYHA class IV Needs hospital admission.  Currently, no elective bed available in the hospital.  Patient will go to ER. Will also need evaluation for any other causes of dyspnea.   Peristent Afib: Rate controlled, asymptomatic. CHA2DS2VASc score 3: Annual stroke risk 4% Continue eliquis 5 mg bid. Continue atenolol 100 mg daily.  Hypertension: Controlled  Acute decompensation, requiring acute hospitalization.  Nigel Mormon, MD [facility] Pager: [phone] Office: [phone]

## 2021-01-19 ENCOUNTER — Telehealth: Payer: Self-pay

## 2021-01-22 ENCOUNTER — Emergency Department (HOSPITAL_COMMUNITY): Payer: Medicare HMO

## 2021-01-22 ENCOUNTER — Observation Stay: Admit: 2021-01-22 | Payer: PPO | Admitting: Cardiology

## 2021-01-22 ENCOUNTER — Other Ambulatory Visit: Payer: Self-pay

## 2021-01-22 ENCOUNTER — Encounter: Payer: Self-pay | Admitting: Cardiology

## 2021-01-22 ENCOUNTER — Encounter (HOSPITAL_COMMUNITY): Payer: Self-pay | Admitting: Emergency Medicine

## 2021-01-22 ENCOUNTER — Inpatient Hospital Stay (HOSPITAL_COMMUNITY)
Admission: EM | Admit: 2021-01-22 | Discharge: 2021-01-30 | DRG: 193 | Disposition: A | Discharge status: Home Health | Payer: Medicare HMO | Attending: Internal Medicine | Admitting: Internal Medicine

## 2021-01-22 ENCOUNTER — Ambulatory Visit: Payer: Medicare HMO | Admitting: Cardiology

## 2021-01-22 VITALS — BP 109/53 | HR 71 | Temp 96.7°F | Resp 16 | Ht 64.0 in

## 2021-01-22 DIAGNOSIS — J984 Other disorders of lung: Secondary | ICD-10-CM | POA: Diagnosis not present

## 2021-01-22 DIAGNOSIS — Z79899 Other long term (current) drug therapy: Secondary | ICD-10-CM | POA: Diagnosis not present

## 2021-01-22 DIAGNOSIS — R0902 Hypoxemia: Secondary | ICD-10-CM | POA: Diagnosis not present

## 2021-01-22 DIAGNOSIS — N183 Chronic kidney disease, stage 3 unspecified: Secondary | ICD-10-CM | POA: Diagnosis present

## 2021-01-22 DIAGNOSIS — J918 Pleural effusion in other conditions classified elsewhere: Secondary | ICD-10-CM | POA: Diagnosis present

## 2021-01-22 DIAGNOSIS — I509 Heart failure, unspecified: Secondary | ICD-10-CM

## 2021-01-22 DIAGNOSIS — J90 Pleural effusion, not elsewhere classified: Secondary | ICD-10-CM | POA: Diagnosis not present

## 2021-01-22 DIAGNOSIS — D509 Iron deficiency anemia, unspecified: Secondary | ICD-10-CM | POA: Diagnosis present

## 2021-01-22 DIAGNOSIS — E662 Morbid (severe) obesity with alveolar hypoventilation: Secondary | ICD-10-CM | POA: Diagnosis present

## 2021-01-22 DIAGNOSIS — Z96652 Presence of left artificial knee joint: Secondary | ICD-10-CM | POA: Diagnosis present

## 2021-01-22 DIAGNOSIS — Z8249 Family history of ischemic heart disease and other diseases of the circulatory system: Secondary | ICD-10-CM | POA: Diagnosis not present

## 2021-01-22 DIAGNOSIS — I4811 Longstanding persistent atrial fibrillation: Secondary | ICD-10-CM | POA: Diagnosis present

## 2021-01-22 DIAGNOSIS — I5033 Acute on chronic diastolic (congestive) heart failure: Secondary | ICD-10-CM | POA: Diagnosis not present

## 2021-01-22 DIAGNOSIS — Z86010 Personal history of colonic polyps: Secondary | ICD-10-CM | POA: Diagnosis not present

## 2021-01-22 DIAGNOSIS — N179 Acute kidney failure, unspecified: Secondary | ICD-10-CM | POA: Diagnosis present

## 2021-01-22 DIAGNOSIS — Z20822 Contact with and (suspected) exposure to covid-19: Secondary | ICD-10-CM | POA: Diagnosis present

## 2021-01-22 DIAGNOSIS — D649 Anemia, unspecified: Secondary | ICD-10-CM | POA: Diagnosis present

## 2021-01-22 DIAGNOSIS — I313 Pericardial effusion (noninflammatory): Secondary | ICD-10-CM | POA: Diagnosis not present

## 2021-01-22 DIAGNOSIS — I110 Hypertensive heart disease with heart failure: Secondary | ICD-10-CM | POA: Diagnosis not present

## 2021-01-22 DIAGNOSIS — I10 Essential (primary) hypertension: Secondary | ICD-10-CM | POA: Diagnosis not present

## 2021-01-22 DIAGNOSIS — J9601 Acute respiratory failure with hypoxia: Secondary | ICD-10-CM | POA: Diagnosis present

## 2021-01-22 DIAGNOSIS — Z9851 Tubal ligation status: Secondary | ICD-10-CM | POA: Diagnosis not present

## 2021-01-22 DIAGNOSIS — I951 Orthostatic hypotension: Secondary | ICD-10-CM | POA: Diagnosis not present

## 2021-01-22 DIAGNOSIS — M8580 Other specified disorders of bone density and structure, unspecified site: Secondary | ICD-10-CM | POA: Diagnosis present

## 2021-01-22 DIAGNOSIS — J189 Pneumonia, unspecified organism: Secondary | ICD-10-CM | POA: Diagnosis not present

## 2021-01-22 DIAGNOSIS — I130 Hypertensive heart and chronic kidney disease with heart failure and stage 1 through stage 4 chronic kidney disease, or unspecified chronic kidney disease: Secondary | ICD-10-CM | POA: Diagnosis present

## 2021-01-22 DIAGNOSIS — N1832 Chronic kidney disease, stage 3b: Secondary | ICD-10-CM | POA: Diagnosis not present

## 2021-01-22 DIAGNOSIS — Z6841 Body Mass Index (BMI) 40.0 and over, adult: Secondary | ICD-10-CM

## 2021-01-22 DIAGNOSIS — Z888 Allergy status to other drugs, medicaments and biological substances status: Secondary | ICD-10-CM

## 2021-01-22 DIAGNOSIS — E039 Hypothyroidism, unspecified: Secondary | ICD-10-CM | POA: Diagnosis present

## 2021-01-22 DIAGNOSIS — Z7901 Long term (current) use of anticoagulants: Secondary | ICD-10-CM

## 2021-01-22 DIAGNOSIS — E785 Hyperlipidemia, unspecified: Secondary | ICD-10-CM | POA: Diagnosis present

## 2021-01-22 DIAGNOSIS — I3139 Other pericardial effusion (noninflammatory): Secondary | ICD-10-CM

## 2021-01-22 DIAGNOSIS — I4821 Permanent atrial fibrillation: Secondary | ICD-10-CM | POA: Diagnosis present

## 2021-01-22 DIAGNOSIS — R9431 Abnormal electrocardiogram [ECG] [EKG]: Secondary | ICD-10-CM | POA: Diagnosis not present

## 2021-01-22 DIAGNOSIS — C801 Malignant (primary) neoplasm, unspecified: Secondary | ICD-10-CM | POA: Diagnosis not present

## 2021-01-22 DIAGNOSIS — I5032 Chronic diastolic (congestive) heart failure: Secondary | ICD-10-CM | POA: Diagnosis present

## 2021-01-22 DIAGNOSIS — Z79890 Hormone replacement therapy (postmenopausal): Secondary | ICD-10-CM

## 2021-01-22 DIAGNOSIS — E1122 Type 2 diabetes mellitus with diabetic chronic kidney disease: Secondary | ICD-10-CM | POA: Diagnosis present

## 2021-01-22 DIAGNOSIS — R0602 Shortness of breath: Secondary | ICD-10-CM | POA: Diagnosis not present

## 2021-01-22 DIAGNOSIS — M7989 Other specified soft tissue disorders: Secondary | ICD-10-CM | POA: Diagnosis present

## 2021-01-22 DIAGNOSIS — I4819 Other persistent atrial fibrillation: Secondary | ICD-10-CM | POA: Diagnosis present

## 2021-01-22 LAB — IRON AND TIBC
Iron: 32 ug/dL (ref 28–170)
Saturation Ratios: 7 % — ABNORMAL LOW (ref 10.4–31.8)
TIBC: 466 ug/dL — ABNORMAL HIGH (ref 250–450)
UIBC: 434 ug/dL

## 2021-01-22 LAB — TROPONIN I (HIGH SENSITIVITY)
Troponin I (High Sensitivity): 6 ng/L (ref <18)
Troponin I (High Sensitivity): 6 ng/L (ref <18)

## 2021-01-22 LAB — CBC
HCT: 38.8 % (ref 36.0–46.0)
Hemoglobin: 10.9 g/dL — ABNORMAL LOW (ref 12.0–15.0)
MCH: 20.7 pg — ABNORMAL LOW (ref 26.0–34.0)
MCHC: 28.1 g/dL — ABNORMAL LOW (ref 30.0–36.0)
MCV: 73.6 fL — ABNORMAL LOW (ref 80.0–100.0)
Platelets: 465 10*3/uL — ABNORMAL HIGH (ref 150–400)
RBC: 5.27 MIL/uL — ABNORMAL HIGH (ref 3.87–5.11)
RDW: 18.1 % — ABNORMAL HIGH (ref 11.5–15.5)
WBC: 13 10*3/uL — ABNORMAL HIGH (ref 4.0–10.5)
nRBC: 0 % (ref 0.0–0.2)

## 2021-01-22 LAB — BASIC METABOLIC PANEL
Anion gap: 10 (ref 5–15)
BUN: 49 mg/dL — ABNORMAL HIGH (ref 8–23)
CO2: 31 mmol/L (ref 22–32)
Calcium: 8.9 mg/dL (ref 8.9–10.3)
Chloride: 95 mmol/L — ABNORMAL LOW (ref 98–111)
Creatinine, Ser: 1.98 mg/dL — ABNORMAL HIGH (ref 0.44–1.00)
GFR, Estimated: 26 mL/min — ABNORMAL LOW (ref >60)
Glucose, Bld: 110 mg/dL — ABNORMAL HIGH (ref 70–99)
Potassium: 4.3 mmol/L (ref 3.5–5.1)
Sodium: 136 mmol/L (ref 135–145)

## 2021-01-22 LAB — BRAIN NATRIURETIC PEPTIDE: B Natriuretic Peptide: 310.6 pg/mL — ABNORMAL HIGH (ref 0.0–100.0)

## 2021-01-22 LAB — SARS CORONAVIRUS 2 (TAT 6-24 HRS): SARS Coronavirus 2: NEGATIVE

## 2021-01-22 LAB — FOLATE: Folate: 11.2 ng/mL (ref >5.9)

## 2021-01-22 LAB — RETICULOCYTES
Immature Retic Fract: 23.6 % — ABNORMAL HIGH (ref 2.3–15.9)
RBC.: 5.32 MIL/uL — ABNORMAL HIGH (ref 3.87–5.11)
Retic Count, Absolute: 106.5 10*3/uL (ref 19.0–186.0)
Retic Ct Pct: 2 % (ref 0.4–3.1)

## 2021-01-22 LAB — LACTIC ACID, PLASMA: Lactic Acid, Venous: 1 mmol/L (ref 0.5–1.9)

## 2021-01-22 LAB — FERRITIN: Ferritin: 70 ng/mL (ref 11–307)

## 2021-01-22 LAB — VITAMIN B12: Vitamin B-12: 1020 pg/mL — ABNORMAL HIGH (ref 180–914)

## 2021-01-22 MED ORDER — SODIUM CHLORIDE 0.9 % IV SOLN
250.0000 mL | INTRAVENOUS | Status: DC | PRN
Start: 2021-01-22 — End: 2021-01-30

## 2021-01-22 MED ORDER — APIXABAN 5 MG PO TABS
5.0000 mg | ORAL_TABLET | Freq: Two times a day (BID) | ORAL | Status: DC
  Administered 2021-01-23 (×2): 5 mg via ORAL
  Filled 2021-01-22 (×2): qty 1

## 2021-01-22 MED ORDER — ACETAMINOPHEN 650 MG RE SUPP: 650.0000 mg | Freq: Four times a day (QID) | RECTAL | Status: DC | PRN

## 2021-01-22 MED ORDER — SODIUM CHLORIDE 0.9% FLUSH
3.0000 mL | Freq: Two times a day (BID) | INTRAVENOUS | Status: DC
  Administered 2021-01-23 – 2021-01-30 (×14): 3 mL via INTRAVENOUS

## 2021-01-22 MED ORDER — SENNA 8.6 MG PO TABS
1.0000 | ORAL_TABLET | Freq: Two times a day (BID) | ORAL | Status: DC
  Administered 2021-01-23 – 2021-01-28 (×9): 8.6 mg via ORAL
  Filled 2021-01-22 (×10): qty 1

## 2021-01-22 MED ORDER — SODIUM CHLORIDE 0.9% FLUSH: 3.0000 mL | INTRAVENOUS | Status: DC | PRN

## 2021-01-22 MED ORDER — HYDROCODONE-ACETAMINOPHEN 5-325 MG PO TABS
1.0000 | ORAL_TABLET | ORAL | Status: DC | PRN
  Administered 2021-01-27 – 2021-01-28 (×2): 2 via ORAL
  Filled 2021-01-22 (×2): qty 2

## 2021-01-22 MED ORDER — PRAVASTATIN SODIUM 40 MG PO TABS
80.0000 mg | ORAL_TABLET | Freq: Every morning | ORAL | Status: DC
  Administered 2021-01-23 – 2021-01-30 (×7): 80 mg via ORAL
  Filled 2021-01-22 (×8): qty 2

## 2021-01-22 MED ORDER — FUROSEMIDE 10 MG/ML IJ SOLN
40.0000 mg | Freq: Once | INTRAMUSCULAR | Status: AC
  Administered 2021-01-22: 40 mg via INTRAVENOUS
  Filled 2021-01-22: qty 4

## 2021-01-22 MED ORDER — SODIUM CHLORIDE 0.9 % IV SOLN
500.0000 mg | Freq: Once | INTRAVENOUS | Status: AC
  Administered 2021-01-22: 500 mg via INTRAVENOUS
  Filled 2021-01-22: qty 500

## 2021-01-22 MED ORDER — POLYETHYLENE GLYCOL 3350 17 G PO PACK: 17.0000 g | PACK | Freq: Every day | ORAL | Status: DC | PRN

## 2021-01-22 MED ORDER — SODIUM CHLORIDE 0.9 % IV SOLN
1.0000 g | Freq: Once | INTRAVENOUS | Status: AC
  Administered 2021-01-22: 1 g via INTRAVENOUS
  Filled 2021-01-22: qty 10

## 2021-01-22 MED ORDER — METOPROLOL TARTRATE 12.5 MG HALF TABLET
12.5000 mg | ORAL_TABLET | Freq: Two times a day (BID) | ORAL | Status: DC
  Administered 2021-01-23 – 2021-01-28 (×10): 12.5 mg via ORAL
  Filled 2021-01-22 (×13): qty 1

## 2021-01-22 MED ORDER — ALBUTEROL SULFATE (2.5 MG/3ML) 0.083% IN NEBU
2.5000 mg | INHALATION_SOLUTION | RESPIRATORY_TRACT | Status: DC | PRN
Start: 2021-01-22 — End: 2021-01-30

## 2021-01-22 MED ORDER — PANTOPRAZOLE SODIUM 40 MG PO TBEC
40.0000 mg | DELAYED_RELEASE_TABLET | Freq: Every day | ORAL | Status: DC
  Administered 2021-01-23 – 2021-01-30 (×7): 40 mg via ORAL
  Filled 2021-01-22 (×7): qty 1

## 2021-01-22 MED ORDER — FUROSEMIDE 10 MG/ML IJ SOLN
40.0000 mg | Freq: Every day | INTRAMUSCULAR | Status: DC
  Administered 2021-01-23: 40 mg via INTRAVENOUS
  Filled 2021-01-22: qty 4

## 2021-01-22 MED ORDER — LEVOTHYROXINE SODIUM 25 MCG PO TABS: 125.0000 ug | ORAL_TABLET | Freq: Every day | ORAL | Status: DC

## 2021-01-22 MED ORDER — LEVOTHYROXINE SODIUM 25 MCG PO TABS
125.0000 ug | ORAL_TABLET | Freq: Every day | ORAL | Status: DC
  Administered 2021-01-23 – 2021-01-30 (×8): 125 ug via ORAL
  Filled 2021-01-22 (×8): qty 1

## 2021-01-22 MED ORDER — SODIUM CHLORIDE 0.9 % IV SOLN
500.0000 mg | Freq: Once | INTRAVENOUS | Status: AC
  Administered 2021-01-23: 500 mg via INTRAVENOUS
  Filled 2021-01-22: qty 500

## 2021-01-22 MED ORDER — BISACODYL 10 MG RE SUPP: 10.0000 mg | Freq: Every day | RECTAL | Status: DC | PRN

## 2021-01-22 MED ORDER — SODIUM CHLORIDE 0.9 % IV SOLN
1.0000 g | Freq: Once | INTRAVENOUS | Status: AC
  Administered 2021-01-23: 1 g via INTRAVENOUS
  Filled 2021-01-22: qty 1

## 2021-01-22 MED ORDER — ACETAMINOPHEN 325 MG PO TABS
650.0000 mg | ORAL_TABLET | Freq: Four times a day (QID) | ORAL | Status: DC | PRN
  Administered 2021-01-26: 650 mg via ORAL
  Filled 2021-01-22: qty 2

## 2021-01-22 MED ORDER — GUAIFENESIN ER 600 MG PO TB12
600.0000 mg | ORAL_TABLET | Freq: Two times a day (BID) | ORAL | Status: DC
  Administered 2021-01-23 – 2021-01-30 (×15): 600 mg via ORAL
  Filled 2021-01-22 (×15): qty 1

## 2021-01-22 NOTE — Progress Notes (Signed)
Reviewed initial ED workup. Vascular congestion, pleural effusion, possibly consolidation LLL, AKI. Recommend admission to medicine service, we will follow along. Could start with IV lasix 40 mg bid. Recommend echocardiogram.   Nigel Mormon, MD Pager: [phone] Office: [phone]

## 2021-01-22 NOTE — ED Provider Notes (Signed)
[facility] EMERGENCY DEPARTMENT Provider Note   CSN: [id number] Arrival date & time: 01/22/21  1430     History Chief Complaint  Patient presents with  . Shortness of Breath    [name] is a 75 y.o. female with PMHx HTN, HLD, CKD stage III, HFpEF, A fib who presents to the ED today with complaint of gradually worsening shortness of breath for the pats 2-3 weeks. Pt was seen by her cardiologist Dr. Virgina Jock today for follow up and appeared to be significantly fluid overloaded with hypoxia with O2 sats at 84% on RA. Pt was sent to the ED for admission with plans for cardiology to follow up with ECHO. Per chart review pt recently saw her PCP and was started on 25 mg Spironolactone on top of her 40 mg Lasix without improvement. Pt states that her legs bilaterally and abdomen has felt more distended recently. She has had a chronic cough however denies worsening cough recently. No fevers or chills. No covid exposure. Pt denies chest pain. No other complaints at this time.   The history is provided by the patient and medical records.       Past Medical History:  Diagnosis Date  . Arthritis   . Atrial fibrillation ([place])   . CHF (congestive heart failure) ([place])   . CKD (chronic kidney disease), stage III ([place])   . Colon polyps   . Dysrhythmia    new onset afib   . Goiter   . Hyperglycemia   . Hyperlipidemia   . Hypertension   . Hypothyroidism   . Morbid obesity ([place])   . Osteopenia     Patient Active Problem List   Diagnosis Date Noted  . Acute on chronic diastolic heart failure ([place]) 12/25/2020  . Leg edema 12/25/2020  . Essential hypertension 12/24/2019  . Longstanding persistent atrial fibrillation ([place]) 12/24/2019  . Preop examination 12/24/2019  . Total knee replacement status 05/29/2017    Past Surgical History:  Procedure Laterality Date  . CARDIOVASCULAR STRESS TEST    . COLONOSCOPY    . [place] SURGERY  2012  . POLYPECTOMY    .  TOTAL KNEE ARTHROPLASTY Left 05/29/2017   Procedure: LEFT TOTAL KNEE ARTHROPLASTY;  Surgeon: Leandrew Koyanagi, MD;  Location: [place];  Service: Orthopedics;  Laterality: Left;  . TUBAL LIGATION  1977  . UPPER GASTROINTESTINAL ENDOSCOPY  12/29/2019  . US ECHOCARDIOGRAPHY       OB History   No obstetric history on file.     Family History  Problem Relation Age of Onset  . CVA Mother   . Heart disease Brother   . Heart disease Father   . Colon cancer Neg Hx   . Stomach cancer Neg Hx   . Colon polyps Neg Hx   . Esophageal cancer Neg Hx   . Rectal cancer Neg Hx     Social History   Tobacco Use  . Smoking status: Never Smoker  . Smokeless tobacco: Never Used  Vaping Use  . Vaping Use: Never used  Substance Use Topics  . Alcohol use: No  . Drug use: No    Home Medications Prior to Admission medications   Medication Sig Start Date End Date Taking? Authorizing Provider  acetaminophen (TYLENOL) 325 MG tablet Take 325-650 mg by mouth at bedtime as needed for mild pain.   Yes [provider]  amLODipine (NORVASC) 10 MG tablet Take 10 mg by mouth in the morning. 06/19/12  Yes  [provider]  atenolol (TENORMIN) 100 MG tablet Take 100 mg by mouth daily.  06/19/12  Yes [provider]  Calcium Carb-Cholecalciferol (CALTRATE 600+D3 PO) Take 1 tablet by mouth every evening.   Yes [provider]  [employer] IRON 325 (65 Fe) MG tablet Take 325 mg by mouth daily at 6 PM. 01/16/21  Yes [provider]  ELIQUIS 5 MG TABS tablet TAKE 1 TABLET BY MOUTH TWICE A DAY Patient taking differently: Take 5 mg by mouth 2 (two) times daily. 01/11/21  Yes Patwardhan, Reynold Bowen, MD  levothyroxine (SYNTHROID, LEVOTHROID) 125 MCG tablet Take 125 mcg by mouth daily before breakfast. 06/19/12  Yes [provider]  losartan (COZAAR) 25 MG tablet Take 25 mg by mouth daily.  03/25/17  Yes [provider]  magic mouthwash SOLN Take 5 mLs by mouth See admin  instructions. Swish 5 ml's in the mouth, then expectorate, four times a day FOR 9 DAYS 01/17/21  Yes [provider]  omeprazole (PRILOSEC) 20 MG capsule TAKE 1 CAPSULE BY MOUTH EVERY DAY Patient taking differently: Take 20 mg by mouth daily before breakfast. 12/22/20  Yes Irene Shipper, MD  pravastatin (PRAVACHOL) 80 MG tablet Take 80 mg by mouth in the morning. 07/16/12  Yes [provider]  spironolactone (ALDACTONE) 25 MG tablet Take 25 mg by mouth daily. 01/16/21  Yes [provider]  vitamin B-12 (CYANOCOBALAMIN) 1000 MCG tablet Take 1,000 mcg by mouth daily. 01/16/21  Yes [provider]    Allergies    Atorvastatin  Review of Systems   Review of Systems  Constitutional: Negative for chills and fever.  Respiratory: Positive for shortness of breath. Negative for wheezing.   Cardiovascular: Positive for leg swelling (bilaterally). Negative for chest pain.  Gastrointestinal: Negative for nausea and vomiting.  All other systems reviewed and are negative.   Physical Exam Updated Vital Signs BP 112/77   Pulse 71   Temp 97.9 F (36.6 C) (Oral)   Resp 14   Ht 5\' 4"  (1.626 m)   Wt 104.3 kg   SpO2 100%   BMI 39.48 kg/m   Physical Exam Vitals and nursing note reviewed.  Constitutional:      Appearance: She is not ill-appearing or diaphoretic.  HENT:     Head: Normocephalic and atraumatic.  Eyes:     Conjunctiva/sclera: Conjunctivae normal.  Cardiovascular:     Rate and Rhythm: Normal rate and regular rhythm.     Pulses: Normal pulses.  Pulmonary:     Effort: Pulmonary effort is normal.     Breath sounds: Rales present.  Abdominal:     Palpations: Abdomen is soft.     Tenderness: There is no abdominal tenderness. There is no guarding or rebound.  Musculoskeletal:     Cervical back: Neck supple.     Right lower leg: Edema present.     Left lower leg: Edema present.     Comments: 1+ pitting edema bilaterally  Skin:    General: Skin is  warm and dry.  Neurological:     Mental Status: She is alert.     ED Results / Procedures / Treatments   Labs (all labs ordered are listed, but only abnormal results are displayed) Labs Reviewed  BASIC METABOLIC PANEL - Abnormal; Notable for the following components:      Result Value   Chloride 95 (*)    Glucose, Bld 110 (*)    BUN 49 (*)    Creatinine,  Ser 1.98 (*)    GFR, Estimated 26 (*)    All other components within normal limits  CBC - Abnormal; Notable for the following components:   WBC 13.0 (*)    RBC 5.27 (*)    Hemoglobin 10.9 (*)    MCV 73.6 (*)    MCH 20.7 (*)    MCHC 28.1 (*)    RDW 18.1 (*)    Platelets 465 (*)    All other components within normal limits  BRAIN NATRIURETIC PEPTIDE - Abnormal; Notable for the following components:   B Natriuretic Peptide 310.6 (*)    All other components within normal limits  SARS CORONAVIRUS 2 (TAT 6-24 HRS)  LACTIC ACID, PLASMA  LACTIC ACID, PLASMA  SODIUM, URINE, RANDOM  CREATININE, URINE, RANDOM  VITAMIN B12  FOLATE  IRON AND TIBC  FERRITIN  RETICULOCYTES  TROPONIN I (HIGH SENSITIVITY)  TROPONIN I (HIGH SENSITIVITY)    EKG None  Radiology DG Chest 2 View  Result Date: 01/22/2021 CLINICAL DATA:  Shortness of breath EXAM: CHEST - 2 VIEW COMPARISON:  04/16/2017 FINDINGS: Low lung volumes. Suspected trace right pleural effusion. Airspace disease at the left lung base with probable small effusion. Enlarged cardiomediastinal silhouette. No pneumothorax. IMPRESSION: Cardiomegaly with low lung volumes, suspected small right and small moderate left pleural effusions, and left basilar consolidation which may reflect atelectasis or pneumonia. Electronically Signed   By: Donavan Foil M.D.   On: 01/22/2021 15:24    Procedures Procedures   Medications Ordered in ED Medications  cefTRIAXone (ROCEPHIN) 1 g in sodium chloride 0.9 % 100 mL IVPB (1 g Intravenous New Bag/Given 01/22/21 1837)  azithromycin (ZITHROMAX) 500 mg  in sodium chloride 0.9 % 250 mL IVPB (has no administration in time range)  furosemide (LASIX) injection 40 mg (40 mg Intravenous Given 01/22/21 1838)    ED Course  I have reviewed the triage vital signs and the nursing notes.  Pertinent labs & imaging results that were available during my care of the patient were reviewed by me and considered in my medical decision making (see chart for details).    MDM Rules/Calculators/A&P                          75 year old female presents with worsening shortness of breath for the past 2 to 3 weeks, seen by Dr. Virgina Jock cardiology today with concern for volume overload and sent here for admission with plan for echo.  She was noted to be hypoxic at the office visit today with O2 sats at 84%, presents on 2 L and satting 96%.  She is not typically on oxygen.  On exam she does have bilateral rales and 1+ pitting edema bilaterally which she states is worse than normal.  She does not typically weigh herself daily.  CBC and BMP was obtained while patient was in the waiting room.  CBC with a leukocytosis today of 13,000.  Patient denies any worsening cough, fevers, chills.  No other infectious type symptoms at this time.  Will await chest x-ray.  BMP with a worsening creatinine of 1.98 and a BUN of 49 however given patient appears volume overloaded do not want to provide fluids today.  Will add on troponin, BNP, lactic acid given leukocytosis today and swab for Covid with plan for admission.   X-ray with findings of bilateral pleural effusion with possible consolidation in the base of the left lung.  Given leukocytosis and worsening shortness of breath  will provide antibiotics to cover for pneumonia at this time.  BNP has returned elevated 310.6 Troponin 6, will repeat. Actiq acid within normal limits at 1.0.  Discussed case directly with Dr. Virgina Jock who came to see patient in the ED, recommends 40 mg IV Lasix and admission to hospitalist, will follow  along.  At shift change case signed out to Providence Lanius, PA-C who will discuss case with hospitalist for admission.   This note was prepared using Dragon voice recognition software and may include unintentional dictation errors due to the inherent limitations of voice recognition software.  Final Clinical Impression(s) / ED Diagnoses Final diagnoses:  Acute on chronic congestive heart failure, unspecified heart failure type [facility])  Hypoxia    Rx / DC Orders ED Discharge Orders    None       Eustaquio Maize, PA-C 01/22/21 1849    Drenda Freeze, MD 01/22/21 2225

## 2021-01-22 NOTE — ED Notes (Signed)
With provider approval, gave pt dinner tray and cup of water. Tolerating well.

## 2021-01-22 NOTE — Progress Notes (Deleted)
No CAD No LV/RV discordance to suggest constrictive pericarditis. Can discharge home tomorrow on lasix 40 mg bid, K supplementation, eliquis 5 mg bid Will arrange outpatient f/u   Nigel Mormon, MD Pager: [phone] Office: [phone]

## 2021-01-22 NOTE — ED Triage Notes (Signed)
Pt arrives to ED POV from Cardiology office with c/o worsening shortness of breath. Recent weight gain and swelling in feet and abd. Reports dyspnea at rest and orthopnea. Denies CP, fever, N/V. SpO2 at 88% on room air.

## 2021-01-22 NOTE — H&P (Signed)
[name] PTW:[id number] DOB: [birth year] DOA: 01/22/2021     PCP: Shon Baton, MD   Outpatient Specialists:   CARDS:  Dr. Nelva Nay dr.Xu  Patient arrived to ER on 01/22/21 at 1430 Referred by Attending Drenda Freeze, MD   Patient coming from: home Lives With family    Chief Complaint:   Chief Complaint  Patient presents with  . Shortness of Breath    HPI: [name] is a 75 y.o. female with medical history significant of hypertension, persistent atrial fibrillation obesity diastolic CHF.    Presented with   worsening dyspnea even with minimal activities short of breath even at rest. Patient reports to me that few months ago she had sore throat and then since then she have been having some coughing as well she also have had some altered taste sensation but this is also been a longstanding.  She is not aware of being Covid positive with a time and has been fully vaccinated But her shortness of breath have gradually progressed. She has not had any fevers or chills. She has not had any chest pain. She was seen her by her primary care provider who try to use spironolactone to help with her diuresis But by the time she presented to cardiology today she was hypoxic down to 84% on room air and appeared to be ill acutely.  She was asked to go to emergency department immediately.    Infectious risk factors:  Reports  shortness of breath, dry cough,    Has been vaccinated against COVID and boosted   Initial COVID TEST  NEGATIVE  Lab Results  Component Value Date   SARSCOV2NAA NEGATIVE 01/22/2021   SARSCOV2NAA RESULT:  NEGATIVE 12/27/2019    Regarding pertinent Chronic problems:     Hyperlipidemia -  on statins Pravachol    HTN on Norvasc atenolol Cozaar  chronic CHF diastolic combined systolic- last echo 7001 showed EF 52% On Lasix and spironolactone    Hypothyroidism:  Lab Results  Component Value Date   TSH 0.595 01/18/2011   on  synthroid    Morbid obesity-   BMI Readings from Last 1 Encounters:  01/22/21 39.48 kg/m       A. Fib -  - CHA2DS2 vas score    5   current  on anticoagulation with   Eliquis,           -  Rate control:  Currently controlled with atenolol    CKD stage IIIb- baseline Cr 1.4 Estimated Creatinine Clearance: 28.9 mL/min (A) (by C-G formula based on SCr of 1.98 mg/dL (H)).  Lab Results  Component Value Date   CREATININE 1.98 (H) 01/22/2021   CREATININE 1.36 (H) 05/30/2017   CREATININE 1.48 (H) 05/20/2017    While in ER:  Vascular congestion, pleural effusion, possibly consolidation LLL, AKI. Chest x-ray showed low lung volumes left lung base with possible small effusion Trace pleural effusions ER Provider Called:  Cardiology    Dr.Manish Esther Hardy, MD  They Recommend admit to medicine  start with IV lasix 40 mg bid. We will follow  Hospitalist was called for admission for CHF exacerbation and ?LLL PNA  The following Work up has been ordered so far:  Orders Placed This Encounter  Procedures  . SARS CORONAVIRUS 2 (TAT 6-24 HRS) Nasopharyngeal Nasopharyngeal Swab  . DG Chest 2 View  . Basic metabolic panel  . CBC  . Brain natriuretic peptide  . Lactic acid, plasma  .  Document Height and Actual Weight  . Consult to hospitalist  ALL PATIENTS BEING ADMITTED/HAVING PROCEDURES NEED COVID-19 SCREENING  . EKG 12-Lead  . ED EKG     Following Medications were ordered in ER: Medications  furosemide (LASIX) injection 40 mg (has no administration in time range)  cefTRIAXone (ROCEPHIN) 1 g in sodium chloride 0.9 % 100 mL IVPB (has no administration in time range)  azithromycin (ZITHROMAX) 500 mg in sodium chloride 0.9 % 250 mL IVPB (has no administration in time range)        Consult Orders  (From admission, onward)         Start     Ordered   01/22/21 1750  Consult to hospitalist  ALL PATIENTS BEING ADMITTED/HAVING PROCEDURES NEED COVID-19 SCREENING Pgd 1802  Once        Comments: ALL PATIENTS BEING ADMITTED/HAVING PROCEDURES NEED COVID-19 SCREENING  Provider:  (Not yet assigned)  Question Answer Comment  Place call to: [facility]   Reason for Consult Admit      01/22/21 1749          Significant initial  Findings: Abnormal Labs Reviewed  BASIC METABOLIC PANEL - Abnormal; Notable for the following components:      Result Value   Chloride 95 (*)    Glucose, Bld 110 (*)    BUN 49 (*)    Creatinine, Ser 1.98 (*)    GFR, Estimated 26 (*)    All other components within normal limits  CBC - Abnormal; Notable for the following components:   WBC 13.0 (*)    RBC 5.27 (*)    Hemoglobin 10.9 (*)    MCV 73.6 (*)    MCH 20.7 (*)    MCHC 28.1 (*)    RDW 18.1 (*)    Platelets 465 (*)    All other components within normal limits  BRAIN NATRIURETIC PEPTIDE - Abnormal; Notable for the following components:   B Natriuretic Peptide 310.6 (*)    All other components within normal limits   Otherwise labs showing:    Recent Labs  Lab 01/22/21 1445  NA 136  K 4.3  CO2 31  GLUCOSE 110*  BUN 49*  CREATININE 1.98*  CALCIUM 8.9    Cr  Up from baseline see below Lab Results  Component Value Date   CREATININE 1.98 (H) 01/22/2021   CREATININE 1.36 (H) 05/30/2017   CREATININE 1.48 (H) 05/20/2017    No results for input(s): AST, ALT, ALKPHOS, BILITOT, PROT, ALBUMIN in the last 168 hours. Lab Results  Component Value Date   CALCIUM 8.9 01/22/2021   WBC      Component Value Date/Time   WBC 13.0 (H) 01/22/2021 1445   LYMPHSABS 1.1 05/20/2017 1106   MONOABS 0.6 05/20/2017 1106   EOSABS 0.1 05/20/2017 1106   BASOSABS 0.1 05/20/2017 1106    Plt: Lab Results  Component Value Date   PLT 465 (H) 01/22/2021    Lactic Acid, Venous    Component Value Date/Time   LATICACIDVEN 1.0 01/22/2021 1605    Procalcitonin   Ordered    HG/HCT  stable,       Component Value Date/Time   HGB 10.9 (L) 01/22/2021 1445   HCT 38.8 01/22/2021 1445    MCV 73.6 (L) 01/22/2021 1445    No results for input(s): LIPASE, AMYLASE in the last 168 hours. No results for input(s): AMMONIA in the last 168 hours.     Troponin 6   ECG: Ordered Personally  reviewed by me showing: HR :84 Rhythm: A.fib.     no evidence of ischemic changes QTC 501    BNP (last 3 results) Recent Labs    12/25/20 1311 01/22/21 1605  BNP 265.7* 310.6*         UA  not ordered   Ordered   ED Triage Vitals  Enc Vitals Group     BP 01/22/21 1443 120/78     Pulse Rate 01/22/21 1443 74     Resp 01/22/21 1443 (!) 22     Temp 01/22/21 1443 97.9 F (36.6 C)     Temp Source 01/22/21 1443 Oral     SpO2 01/22/21 1443 (!) 88 %     Weight 01/22/21 1444 230 lb (104.3 kg)     Height 01/22/21 1444 5\' 4"  (1.626 m)     Head Circumference --      Peak Flow --      Pain Score 01/22/21 1444 0     Pain Loc --      Pain Edu? --      Excl. in [place]? --   TMAX(24)@       Latest  Blood pressure 117/75, pulse 63, temperature 97.9 F (36.6 C), temperature source Oral, resp. rate 16, height 5\' 4"  (1.626 m), weight 104.3 kg, SpO2 96 %.     Review of Systems:    Pertinent positives include:   fatigue,  shortness of breath at rest  dyspnea on exertion,  non-productive cough Constitutional:  No weight loss, night sweats, Fevers, chills,weight loss  HEENT:  No headaches, Difficulty swallowing,Tooth/dental problems,Sore throat,  No sneezing, itching, ear ache, nasal congestion, post nasal drip,  Cardio-vascular:  No chest pain, Orthopnea, PND, anasarca, dizziness, palpitations.no Bilateral lower extremity swelling  GI:  No heartburn, indigestion, abdominal pain, nausea, vomiting, diarrhea, change in bowel habits, loss of appetite, melena, blood in stool, hematemesis Resp:  no . NoNo excess mucus, no productive cough, No, No coughing up of blood.No change in color of mucus.No wheezing. Skin:  no rash or lesions. No jaundice GU:  no dysuria, change in color of urine, no  urgency or frequency. No straining to urinate.  No flank pain.  Musculoskeletal:  No joint pain or no joint swelling. No decreased range of motion. No back pain.  Psych:  No change in mood or affect. No depression or anxiety. No memory loss.  Neuro: no localizing neurological complaints, no tingling, no weakness, no double vision, no gait abnormality, no slurred speech, no confusion  All systems reviewed and apart from [place] all are negative  Past Medical History:   Past Medical History:  Diagnosis Date  . Arthritis   . Atrial fibrillation ([place])   . CHF (congestive heart failure) ([place])   . CKD (chronic kidney disease), stage III ([place])   . Colon polyps   . Dysrhythmia    new onset afib   . Goiter   . Hyperglycemia   . Hyperlipidemia   . Hypertension   . Hypothyroidism   . Morbid obesity ([place])   . Osteopenia      Past Surgical History:  Procedure Laterality Date  . CARDIOVASCULAR STRESS TEST    . COLONOSCOPY    . [place] SURGERY  2012  . POLYPECTOMY    . TOTAL KNEE ARTHROPLASTY Left 05/29/2017   Procedure: LEFT TOTAL KNEE ARTHROPLASTY;  Surgeon: Leandrew Koyanagi, MD;  Location: [facility];  Service: Orthopedics;  Laterality: Left;  . TUBAL LIGATION  1977  .  UPPER GASTROINTESTINAL ENDOSCOPY  12/29/2019  . US ECHOCARDIOGRAPHY      Social History:  Ambulatory   walker       reports that she has never smoked. She has never used smokeless tobacco. She reports that she does not drink alcohol and does not use drugs.   Family History:   Family History  Problem Relation Age of Onset  . CVA Mother   . Heart disease Brother   . Heart disease Father   . Colon cancer Neg Hx   . Stomach cancer Neg Hx   . Colon polyps Neg Hx   . Esophageal cancer Neg Hx   . Rectal cancer Neg Hx     Allergies: Allergies  Allergen Reactions  . Atorvastatin     Other reaction(s): aches      Prior to Admission medications   Medication Sig Start Date End Date Taking? Authorizing Provider   acetaminophen (TYLENOL) 325 MG tablet Take 650 mg by mouth as needed (for pain.).     [provider]  amLODipine (NORVASC) 10 MG tablet Take 10 mg by mouth daily at 3 pm.  06/19/12   [provider]  atenolol (TENORMIN) 100 MG tablet Take 100 mg by mouth daily.  06/19/12   [provider]  [employer] IRON 325 (65 Fe) MG tablet Take 325 mg by mouth daily. 01/16/21   [provider]  ELIQUIS 5 MG TABS tablet TAKE 1 TABLET BY MOUTH TWICE A DAY 01/11/21   Patwardhan, Manish J, MD  levothyroxine (SYNTHROID, LEVOTHROID) 125 MCG tablet Take 125 mcg by mouth daily.  06/19/12   [provider]  losartan (COZAAR) 25 MG tablet Take 25 mg by mouth daily.  03/25/17   [provider]  omeprazole (PRILOSEC) 20 MG capsule TAKE 1 CAPSULE BY MOUTH EVERY DAY 12/22/20   Irene Shipper, MD  pravastatin (PRAVACHOL) 80 MG tablet Take 80 mg by mouth daily at 3 pm.  07/16/12   [provider]  spironolactone (ALDACTONE) 25 MG tablet Take 25 mg by mouth daily. 01/16/21   [provider]  vitamin B-12 (CYANOCOBALAMIN) 1000 MCG tablet Take 1,000 mcg by mouth daily. 01/16/21   [provider]   Physical Exam: Vitals with BMI 01/22/2021 01/22/2021 01/22/2021  Height - - -  Weight - - -  BMI - - -  Systolic 144 315 -  Diastolic 75 77 -  Pulse 63 71 74    1. General:  in No  Acute distress    Chronically ill  -appearing 2. Psychological: Alert and  Oriented 3. Head/ENT:   Moist Mucous Membranes                          Head Non traumatic, neck supple                          Poor Dentition 4. SKIN: normal  Skin turgor,  Skin clean Dry and intact no rash 5. Heart: Regular rate and rhythm no  Murmur, no Rub or gallop 6. Lungs:  diminished no wheezes mild crackles   7. Abdomen: Soft, non-tender distended   obese  bowel sounds present 8. Lower extremities: no clubbing, cyanosis, trace edema 9. Neurologically Grossly intact, moving all 4 extremities equally   10. MSK: Normal range of motion   All other LABS:     Recent Labs  Lab 01/22/21 1445  WBC 13.0*  HGB 10.9*  HCT 38.8  MCV 73.6*  PLT 465*     Recent Labs  Lab 01/22/21 1445  NA 136  K 4.3  CL 95*  CO2 31  GLUCOSE 110*  BUN 49*  CREATININE 1.98*  CALCIUM 8.9     No results for input(s): AST, ALT, ALKPHOS, BILITOT, PROT, ALBUMIN in the last 168 hours.     Cultures:    Component Value Date/Time   SDES URINE, CLEAN CATCH 01/31/2011 1247   SPECREQUEST Vancomycin and Rocephin IMMUNE:NORM UT SYMPT:POS 01/31/2011 1247   CULT NO GROWTH 01/31/2011 1247   REPTSTATUS 02/02/2011 FINAL 01/31/2011 1247     Radiological Exams on Admission: DG Chest 2 View  Result Date: 01/22/2021 CLINICAL DATA:  Shortness of breath EXAM: CHEST - 2 VIEW COMPARISON:  04/16/2017 FINDINGS: Low lung volumes. Suspected trace right pleural effusion. Airspace disease at the left lung base with probable small effusion. Enlarged cardiomediastinal silhouette. No pneumothorax. IMPRESSION: Cardiomegaly with low lung volumes, suspected small right and small moderate left pleural effusions, and left basilar consolidation which may reflect atelectasis or pneumonia. Electronically Signed   By: Donavan Foil M.D.   On: 01/22/2021 15:24    Chart has been reviewed    Assessment/Plan  75 y.o. female with medical history significant of hypertension, persistent atrial fibrillation obesity diastolic CHF.     Admitted for LLL PNA and acute respiratory failure with hypoxia associated with diastolic CHF exacerbation  Present on Admission: . Acute on chronic diastolic heart failure ([place]) -  - admit on telemetry,  cycle cardiac enzymes, Troponin 6   obtain serial ECG  to evaluate for ischemia as a cause of heart failure  monitor daily weight:  Filed Weights   01/22/21 1444  Weight: 104.3 kg   Last BNP BNP (last 3 results) Recent Labs    12/25/20 1311 01/22/21 1605  BNP 265.7* 310.6*    diurese with IV  lasix and monitor orthostatics and creatinine to avoid over diuresis.  Order echogram to evaluate EF and valves  ACE/ARBi Contraindicated due to AKI   Cardiology aware  . Longstanding persistent atrial fibrillation (HCC)  -continue Eliquis Given AKI and worsening renal function will change from atenolol to different beta-blocker  metoprolol holding parameters  . Essential hypertension -hold ARB given worsening renal function.  Hold Norvasc given somewhat soft blood pressures and so we will continue beta-blocker hold)  . Left lower lobe pneumonia -  - will admit for treatment of CAP will start on appropriate antibiotic coverage.  Rocephin azithromycin   Obtain:  sputum cultures,                  Obtain respiratory panel and influenza serologies                  blood cultures if febrile or if decompensates.                   strep pneumo UA antigen,   Provide oxygen as needed.  Covid negative  . Acute respiratory failure with hypoxia (HCC) -  this patient has acute respiratory failure with Hypoxia as documented by the presence of following: O2 saturatio< 90%   Likely due to:  Pneumonia,and  CHF exacerbation,  Provide O2 therapy and titrate as needed  check Pulse ox with ambulation prior to discharge   may need  TC consult for home O2 set up  flutter valve ordered  Chronic hypothyroidism - - Check TSH continue home medications  at current dose  Qt - - will monitor on tele avoid QT prolonging medications, rehydrate correct electrolytes   Other plan as per orders.  DVT prophylaxis:  eliquis     Code Status:    Code Status: Prior FULL CODE  as per patient  I had personally discussed CODE STATUS with patient     Family Communication:   Family not at  Bedside    Disposition Plan:          To home once workup is complete and patient is stable   Following barriers for discharge:                            Electrolytes corrected                               Anemia stable                                                   able to transition to PO antibiotics                                                   Will likely need home health, home O2, set up                           Will need consultants to evaluate patient prior to discharge                      Would benefit from PT/OT eval prior to DC  Ordered                                      Transition of care consulted                     Consults called: cardiology is aware   Admission status:  ED Disposition    ED Disposition Condition [place]: [place] [100100]  Level of Care: Telemetry Cardiac [103]  May admit patient to [facility] or [facility] if equivalent level of care is available:: No  Covid Evaluation: Asymptomatic Screening Protocol (No Symptoms)  Diagnosis: CHF exacerbation [facility]) [696789]  Admitting Physician: Toy Baker [[id number]]  Attending Physician: Toy Baker [[id number]]  Estimated length of stay: past midnight tomorrow  Certification:: I certify this patient will need inpatient services for at least 2 midnights         inpatient     I Expect 2 midnight stay secondary to severity of patient's current illness need for inpatient interventions justified by the following:  hemodynamic instability despite optimal treatment ( hypoxia, ) * Severe lab/radiological/exam abnormalities including:     and extensive comorbidities including:    CHF    Morbid Obesity  CKD .   Marland Kitchen Chronic anticoagulation  That are currently affecting medical management.   I expect  patient to be hospitalized for 2 midnights requiring inpatient  medical care.  Patient is at high risk for adverse outcome (such as loss of life or disability) if not treated.  Indication for inpatient stay as follows:    New or worsening hypoxia  Need for IV antibiotics,     Level of care    tele  For   24H     Lab Results  Component Value Date   [place] NEGATIVE  01/22/2021     Precautions: admitted as   Covid Negative    PPE: Used by the provider:   P100  eye Goggles,  Gloves    Elis Sauber 01/22/2021, 11:37 PM    [facility]     after 2 AM please page floor coverage PA If 7AM-7PM, please contact the day team taking care of the patient using [email or web address]   Patient was evaluated in the context of the global COVID-19 pandemic, which necessitated consideration that the patient might be at risk for infection with the SARS-CoV-2 virus that causes COVID-19. Institutional protocols and algorithms that pertain to the evaluation of patients at risk for COVID-19 are in a state of rapid change based on information released by regulatory bodies including the CDC and federal and state organizations. These policies and algorithms were followed during the patient's care.

## 2021-01-23 ENCOUNTER — Inpatient Hospital Stay (HOSPITAL_COMMUNITY): Payer: Medicare HMO

## 2021-01-23 DIAGNOSIS — D649 Anemia, unspecified: Secondary | ICD-10-CM

## 2021-01-23 DIAGNOSIS — I509 Heart failure, unspecified: Secondary | ICD-10-CM

## 2021-01-23 DIAGNOSIS — N179 Acute kidney failure, unspecified: Secondary | ICD-10-CM

## 2021-01-23 DIAGNOSIS — I313 Pericardial effusion (noninflammatory): Secondary | ICD-10-CM

## 2021-01-23 DIAGNOSIS — I3139 Other pericardial effusion (noninflammatory): Secondary | ICD-10-CM

## 2021-01-23 DIAGNOSIS — I5033 Acute on chronic diastolic (congestive) heart failure: Secondary | ICD-10-CM

## 2021-01-23 DIAGNOSIS — J9601 Acute respiratory failure with hypoxia: Secondary | ICD-10-CM

## 2021-01-23 LAB — COMPREHENSIVE METABOLIC PANEL WITH GFR
ALT: 19 U/L (ref 0–44)
AST: 65 U/L — ABNORMAL HIGH (ref 15–41)
Albumin: 3.1 g/dL — ABNORMAL LOW (ref 3.5–5.0)
Alkaline Phosphatase: 52 U/L (ref 38–126)
Anion gap: 16 — ABNORMAL HIGH (ref 5–15)
BUN: 50 mg/dL — ABNORMAL HIGH (ref 8–23)
CO2: 20 mmol/L — ABNORMAL LOW (ref 22–32)
Calcium: 8.4 mg/dL — ABNORMAL LOW (ref 8.9–10.3)
Chloride: 98 mmol/L (ref 98–111)
Creatinine, Ser: 1.87 mg/dL — ABNORMAL HIGH (ref 0.44–1.00)
GFR, Estimated: 28 mL/min — ABNORMAL LOW
Glucose, Bld: 101 mg/dL — ABNORMAL HIGH (ref 70–99)
Potassium: 6 mmol/L — ABNORMAL HIGH (ref 3.5–5.1)
Sodium: 134 mmol/L — ABNORMAL LOW (ref 135–145)
Total Bilirubin: 1 mg/dL (ref 0.3–1.2)
Total Protein: 6.2 g/dL — ABNORMAL LOW (ref 6.5–8.1)

## 2021-01-23 LAB — ECHOCARDIOGRAM COMPLETE
Height: 64 in
S' Lateral: 3 cm
Weight: 4194.03 oz

## 2021-01-23 LAB — CBC WITH DIFFERENTIAL/PLATELET
Abs Immature Granulocytes: 0.07 10*3/uL (ref 0.00–0.07)
Basophils Absolute: 0.1 10*3/uL (ref 0.0–0.1)
Basophils Relative: 0 %
Eosinophils Absolute: 0.1 10*3/uL (ref 0.0–0.5)
Eosinophils Relative: 1 %
HCT: 36.9 % (ref 36.0–46.0)
Hemoglobin: 11.1 g/dL — ABNORMAL LOW (ref 12.0–15.0)
Immature Granulocytes: 1 %
Lymphocytes Relative: 6 %
Lymphs Abs: 0.7 10*3/uL (ref 0.7–4.0)
MCH: 21.9 pg — ABNORMAL LOW (ref 26.0–34.0)
MCHC: 30.1 g/dL (ref 30.0–36.0)
MCV: 72.6 fL — ABNORMAL LOW (ref 80.0–100.0)
Monocytes Absolute: 1.2 10*3/uL — ABNORMAL HIGH (ref 0.1–1.0)
Monocytes Relative: 10 %
Neutro Abs: 10.3 10*3/uL — ABNORMAL HIGH (ref 1.7–7.7)
Neutrophils Relative %: 82 %
Platelets: 438 10*3/uL — ABNORMAL HIGH (ref 150–400)
RBC: 5.08 MIL/uL (ref 3.87–5.11)
RDW: 18.4 % — ABNORMAL HIGH (ref 11.5–15.5)
WBC: 12.4 10*3/uL — ABNORMAL HIGH (ref 4.0–10.5)
nRBC: 0 % (ref 0.0–0.2)

## 2021-01-23 LAB — POTASSIUM: Potassium: 4.2 mmol/L (ref 3.5–5.1)

## 2021-01-23 LAB — PROCALCITONIN: Procalcitonin: 0.14 ng/mL

## 2021-01-23 LAB — MAGNESIUM: Magnesium: 2.1 mg/dL (ref 1.7–2.4)

## 2021-01-23 LAB — PHOSPHORUS: Phosphorus: 5 mg/dL — ABNORMAL HIGH (ref 2.5–4.6)

## 2021-01-23 LAB — TSH: TSH: 1.499 u[IU]/mL (ref 0.350–4.500)

## 2021-01-23 MED ORDER — FUROSEMIDE 10 MG/ML IJ SOLN
40.0000 mg | Freq: Two times a day (BID) | INTRAMUSCULAR | Status: DC
  Administered 2021-01-23 – 2021-01-25 (×4): 40 mg via INTRAVENOUS
  Filled 2021-01-23 (×4): qty 4

## 2021-01-23 NOTE — Progress Notes (Signed)
Dr. Virgina Jock called after reviewing echo results. Transfer to [address] 17. She contacted her daughter, Levada Dy and this nurse spoke w/ her to provide updates and her new room number. VS taken and stable. Denies chest pain, dizziness, shortness of breath. She stated, " I just feel tired. "

## 2021-01-23 NOTE — Progress Notes (Signed)
Please see in addition to PT note:   SATURATION QUALIFICATIONS: (This note is used to comply with regulatory documentation for home oxygen)  Patient Saturations on Room Air at Rest = 77%  Patient Saturations on Room Air while Ambulating = Unable to tolerate   Patient Saturations on 2 Liters of oxygen while Ambulating = Unable to tolerate  Please briefly explain why patient needs home oxygen:Quickly dropped to 77% on RA with sitting, needed > 2 min to rise to 92% on 3L at rest. SPO2 > 92% on 3 L during ambulation  Rosita Kea, SPT

## 2021-01-23 NOTE — Progress Notes (Signed)
Subjective:  Somnolent this morning States breathing is "better, I guess"  Objective:  Vital Signs in the last 24 hours: Temp:  [96.7 F (35.9 C)-97.9 F (36.6 C)] 97.9 F (36.6 C) (02/21 1443) Pulse Rate:  [59-124] 75 (02/22 0457) Resp:  [14-23] 18 (02/22 0457) BP: (104-126)/(53-96) 113/70 (02/22 0457) SpO2:  [85 %-100 %] 91 % (02/22 0457) Weight:  [104.3 kg-118.9 kg] 118.9 kg (02/22 0500)  Intake/Output from previous day: 02/21 0701 - 02/22 0700 In: 568.3 [P.O.:240; IV Piggyback:328.3] Out: -   Physical Exam Vitals and nursing note reviewed.  Constitutional:      General: She is not in acute distress.    Appearance: She is well-developed. She is ill-appearing.  HENT:     Head: Normocephalic and atraumatic.  Eyes:     Conjunctiva/sclera: Conjunctivae normal.     Pupils: Pupils are equal, round, and reactive to light.  Neck:     Vascular: No JVD.  Cardiovascular:     Rate and Rhythm: Normal rate. Rhythm irregularly irregular.     Pulses: Normal pulses and intact distal pulses.     Heart sounds: No murmur heard.   Pulmonary:     Effort: Pulmonary effort is normal.     Breath sounds: Decreased air movement present. Examination of the right-lower field reveals decreased breath sounds. Examination of the left-lower field reveals decreased breath sounds. Decreased breath sounds present. No wheezing or rales.  Abdominal:     General: Bowel sounds are normal.     Palpations: Abdomen is soft.     Tenderness: There is no rebound.  Musculoskeletal:        General: No tenderness. Normal range of motion.     Right lower leg: Edema (2+) present.     Left lower leg: Edema (2+) present.  Lymphadenopathy:     Cervical: No cervical adenopathy.  Skin:    General: Skin is warm and dry.  Neurological:     Mental Status: She is alert and oriented to person, place, and time.     Cranial Nerves: No cranial nerve deficit.      Lab Results: BMP Recent Labs    01/22/21 1445  01/23/21 0110 01/23/21 0442  NA 136 134*  --   K 4.3 6.0* 4.2  CL 95* 98  --   CO2 31 20*  --   GLUCOSE 110* 101*  --   BUN 49* 50*  --   CREATININE 1.98* 1.87*  --   CALCIUM 8.9 8.4*  --   GFRNONAA 26* 28*  --     CBC Recent Labs  Lab 01/23/21 0110  WBC 12.4*  RBC 5.08  HGB 11.1*  HCT 36.9  PLT 438*  MCV 72.6*  MCH 21.9*  MCHC 30.1  RDW 18.4*  LYMPHSABS 0.7  MONOABS 1.2*  EOSABS 0.1  BASOSABS 0.1     Cardiac Panel (last 3 results) Results for LARUTH, HANGER (MRN [id number]) as of 01/23/2021 10:36  Ref. Range 01/22/2021 16:05 01/22/2021 20:39  B Natriuretic Peptide Latest Ref Range: 0.0 - 100.0 pg/mL 310.6 (H)   Troponin I (High Sensitivity) Latest Ref Range: <18 ng/L 6 6    BNP (last 3 results) Recent Labs    12/25/20 1311 01/22/21 1605  BNP 265.7* 310.6*    TSH Recent Labs    01/23/21 0110  TSH 1.499     Hepatic Function Panel Recent Labs    01/23/21 0110  PROT 6.2*  ALBUMIN 3.1*  AST 65*  ALT  19  ALKPHOS 52  BILITOT 1.0    EXAM: CHEST - 2 VIEW 01/22/21  COMPARISON:  04/16/2017  FINDINGS: Low lung volumes. Suspected trace right pleural effusion. Airspace disease at the left lung base with probable small effusion. Enlarged cardiomediastinal silhouette. No pneumothorax.  IMPRESSION: Cardiomegaly with low lung volumes, suspected small right and small moderate left pleural effusions, and left basilar consolidation which may reflect atelectasis or pneumonia.  Cardiac Studies:  EKG 01/22/21: Afib with controlled ventricular response Low voltage Prolonged QT  Echocardiogram pending:  Assessment & Recommendations:  75 year old Caucasian female with hypertension, persistent atrial fibrillation, moderate obesity, HFpEF  Acute on chronic HFpEF:. Progressive worsening, now NYHA class IV Continue IV lasix 40 mg bid Strict I/O, daily weights May consider Entresto, if and when renal function improves  AKI/CKD: Likely  cardiorenal secondary top congestion Continue diuresis  Peristent Afib: Rate controlled, asymptomatic. CHA2DS2VASc score 3: Annual stroke risk 4% Continue eliquis 5 mg bid. Continue atenolol 100 mg daily.  Hypertension: Controlled     Nigel Mormon, MD Pager: [phone] Office: [phone]

## 2021-01-23 NOTE — Progress Notes (Signed)
  Echocardiogram 2D Echocardiogram has been performed.  [name] 01/23/2021, 6:24 PM

## 2021-01-23 NOTE — Evaluation (Signed)
Occupational Therapy Evaluation Patient Details Name: [name] MRN: [id number] DOB: 1946-03-02 Today's Date: 01/23/2021    History of Present Illness [name] is a 75 y.o. female with medical history significant of hypertension, persistent atrial fibrillation obesity diastolic CHF who presented with worsening dyspenea, SpO2 84% on RA at cardiology appointment.   Clinical Impression   PTA, pt lives with spouse and reports Modified Independence with ADLs and mobility using Rollator. Husband completes 22 of IADLs in the home and pushes pt in wheelchair when out in the community when needed. Pt does not wear O2 at baseline. Pt presents now with deficits in cardiopulmonary tolerance, strength, cognition, and dynamic standing balance. Pt overall Supervision for bed mobility and min guard for simple transfers. Unable to progress mobility during OT eval due to no RW available in room. Pt requires Setup for UB ADLs and Min A for LB ADLs. Began education on energy conservation strategies, especially if O2 still needed at discharge. Discussed benefits of obtaining a shower chair. Anticipate pt to progress well enough to return home with [facility] follow-up. Plan to progress ADL mobility, reinforce energy conservation and breathing strategies during ADLs in future sessions.  Pt received on 3 L O2, SpO2 93-95% so titrated to 2 L O2. After bed mobility, SpO2 desats to 87% on 2 L O2 (pt also noted to hold her breath). With pursed lip breathing, quickly rebounds to 91% on 2 L O2. No increase in respirations noted after transfer.    Follow Up Recommendations  Home health OT;Supervision/Assistance - 24 hour    Equipment Recommendations  Tub/shower seat    Recommendations for Other Services       Precautions / Restrictions Precautions Precautions: Fall Restrictions Weight Bearing Restrictions: No      Mobility Bed Mobility Overal bed mobility: Needs Assistance Bed Mobility: Supine to Sit      Supine to sit: Supervision;HOB elevated     General bed mobility comments: Supervision, light use of bed rail. Noted to hold breath, required cues for controlled breathing throughout activity as this could lead to desats    Transfers Overall transfer level: Needs assistance Equipment used: 1 person hand held assist Transfers: Sit to/from [facility] Sit to Stand: Min guard Stand pivot transfers: Min guard       General transfer comment: min guard for safety in turning to chair, cued to reach for armrest and use therapist  hand as no RW in pt room.    Balance Overall balance assessment: Needs assistance Sitting-balance support: Feet supported Sitting balance-Leahy Scale: Good     Standing balance support: Single extremity supported;During functional activity Standing balance-Leahy Scale: Poor Standing balance comment: reliant on UE support in standing                           ADL either performed or assessed with clinical judgement   ADL Overall ADL's : Needs assistance/impaired Eating/Feeding: Independent;Sitting   Grooming: Set up;Sitting;Wash/dry face   Upper Body Bathing: Set up;Sitting   Lower Body Bathing: Minimal assistance;Sitting/lateral leans;Sit to/from stand   Upper Body Dressing : Set up;Sitting   Lower Body Dressing: Minimal assistance;Sitting/lateral leans;Sit to/from stand   Toilet Transfer: Min guard;Stand-pivot   Toileting- Water quality scientist and Hygiene: Min guard;Sit to/from stand;Sitting/lateral lean         General ADL Comments: Pt with deficits in cardiopulmonary tolerance, dynamic standing balance requiring UE support and new use of O2. Educated on energy  conservation strategies to implement especially if pt discharges with O2. Main priority would be obtaining shower chair and using this during bathing tasks     Vision Baseline Vision/History: Wears glasses Wears Glasses:  (for driving) Patient Visual  Report: No change from baseline Vision Assessment?: No apparent visual deficits     Perception     Praxis      Pertinent Vitals/Pain Pain Assessment: No/denies pain     Hand Dominance Right   Extremity/Trunk Assessment Upper Extremity Assessment Upper Extremity Assessment: Generalized weakness   Lower Extremity Assessment Lower Extremity Assessment: Defer to PT evaluation   Cervical / Trunk Assessment Cervical / Trunk Assessment: Kyphotic   Communication Communication Communication: No difficulties   Cognition Arousal/Alertness: Awake/alert Behavior During Therapy: WFL for tasks assessed/performed Overall Cognitive Status: No family/caregiver present to determine baseline cognitive functioning Area of Impairment: Awareness;Safety/judgement;Problem solving;Memory                     Memory: Decreased short-term memory   Safety/Judgement: Decreased awareness of deficits Awareness: Emergent Problem Solving: Slow processing;Requires verbal cues;Requires tactile cues General Comments: Pt able to correctly report situation, date, etc. Pt does report "yelling" when staff in room earlier though unsure of why she was yelling and apologized for it. Asked NT after session but reports this pt was not yelling this AM (maybe night shift vs other cognitive impairment?). Decreased recall for purewick noted with OT and staff earlier today. Slow to respond at times but quick to laugh appropriately at jokes. Likely close to baseline cognitively   General Comments  SpO2 93-95% on 3 L O2 at rest. After bed mobility (with pt noted to hold breath), O2 at 87% on 2 L O2. Cued for pursed lip breathing with quick rebound to 91% on 2 L O2. Unable to obtain reading after transfer but no increase in RR noted. No RW in pt's room. OT had obtained RW from equipment closet but noted to be missing a leg so unable to use. RN aware no RW in room so mobility assessment limited    Exercises     Shoulder  Instructions      Home Living Family/patient expects to be discharged to:: Private residence Living Arrangements: Spouse/significant other Available Help at Discharge: Family;Available 24 hours/day Type of Home: House Home Access: Stairs to enter;Ramped entrance Entrance Stairs-Number of Steps: 3   Home Layout: Two level;Able to live on main level with bedroom/bathroom     Bathroom Shower/Tub: Occupational psychologist: Handicapped height     Home Equipment: Grab bars - toilet;Walker - 4 wheels;Walker - 2 wheels;Bedside commode;Hand held shower head;Other (comment);Wheelchair - manual (lift chair, pulse ox)   Additional Comments: uses RW to hold on to in shower      Prior Functioning/Environment Level of Independence: Needs assistance  Gait / Transfers Assistance Needed: uses Rollator for mobility, denies any falls. Husband pushes pt in wheelchair if going out shopping, etc ADL's / Homemaking Assistance Needed: Able to complete ADLs without assist. Husband completes majority of IADLs. Pt still drives but husband drives most of the time            OT Problem List: Decreased strength;Decreased activity tolerance;Impaired balance (sitting and/or standing);Decreased cognition;Cardiopulmonary status limiting activity;Increased edema      OT Treatment/Interventions: Self-care/ADL training;Therapeutic exercise;Energy conservation;DME and/or AE instruction;Therapeutic activities;Patient/family education;Balance training    OT Goals(Current goals can be found in the care plan section) Acute Rehab OT Goals Patient Stated Goal:  decrease fluid, be able to go home soon OT Goal Formulation: With patient Time For Goal Achievement: 02/06/21 Potential to Achieve Goals: Good ADL Goals Pt Will Perform Grooming: with modified independence;standing Pt Will Perform Lower Body Bathing: with modified independence;sitting/lateral leans;sit to/from stand Pt Will Perform Lower Body  Dressing: with modified independence;sitting/lateral leans;sit to/from stand Pt Will Transfer to Toilet: with modified independence;ambulating Additional ADL Goal #1: Pt to demonstrate implementation of at least 2 energy conservation strategies during ADLs/mobility  OT Frequency: Min 2X/week   Barriers to D/C:            Co-evaluation              AM-PAC OT "6 Clicks" Daily Activity     Outcome Measure Help from another person eating meals?: None Help from another person taking care of personal grooming?: A Little Help from another person toileting, which includes using toliet, bedpan, or urinal?: A Little Help from another person bathing (including washing, rinsing, drying)?: A Little Help from another person to put on and taking off regular upper body clothing?: A Little Help from another person to put on and taking off regular lower body clothing?: A Little 6 Click Score: 19   End of Session Equipment Utilized During Treatment: Oxygen Nurse Communication: Mobility status;Other (comment) (O2, no RW in room)  Activity Tolerance: Patient tolerated treatment well Patient left: in chair;with call bell/phone within reach;with chair alarm set  OT Visit Diagnosis: Unsteadiness on feet (R26.81);Other abnormalities of gait and mobility (R26.89);Other (comment);Other symptoms and signs involving cognitive function (decreased cardiopulmonary tolerance)                Time: 1004-1040 OT Time Calculation (min): 36 min Charges:  OT General Charges $OT Visit: 1 Visit OT Evaluation $OT Eval Moderate Complexity: 1 Mod OT Treatments $Self Care/Home Management : 8-22 mins  Malachy Chamber, OTR/L Acute Rehab Services Office: [phone]  Layla Maw 01/23/2021, 11:13 AM

## 2021-01-23 NOTE — Progress Notes (Addendum)
Reviewed Echocardiogram. Echocardiogram concerning for cardiac tamponade. BP 99/86 mmHg, HR 109 in Afib. O2 sat 94% on 2 L O2.  Hold eliquis and lasix. Will hydrate gently with 50 cc/hr fluid Ideally, would try to buy time till eliquis is out of the system by tomorrow, unless SBP stays <90 mmHg, HR >110 bpm-in which case may need urgent pericardiocentesis.   Will transfer to cardiac progressive unit. Updated patient, RN, primary team, and husband Nathaneil Canary. Keep NPO after midnight    Nigel Mormon, MD Pager: [phone] Office: [phone]

## 2021-01-23 NOTE — Progress Notes (Addendum)
PROGRESS NOTE  [name] DSK:[id number] DOB: [birth year] DOA: 01/22/2021 PCP: Shon Baton, MD   LOS: 1 day   Brief Narrative / Interim history: 75 year old female with history of HTN, persistent A. fib, obesity, chronic diastolic CHF comes into the hospital with worsening shortness of breath.  She reports that a few weeks ago had a sore throat and has been having a persistent cough since.  She also was feeling weaker than normal.  Her shortness of breath has gradually gotten worse, she also has noticed that she is having more swelling in her legs and decided to come to the hospital.  She does really watch sodium intake at home.  She tells me she does have a scale but does not weigh herself regularly.  She could not tell me her most recent weight.  She went to see her cardiologist was found to be 84% on room air and was sent to the hospital.  Subjective / 24h Interval events: Seen at bedside this morning, she denies any chest pain, denies any shortness of breath at rest.  She is a bit sleepy.  She is not sure if she is feeling better today  Assessment & Plan: Principal Problem Acute hypoxic respiratory failure due to acute on chronic diastolic CHF-chest x-ray on admission confirmed fluid overload with cardiomegaly, bilateral small pleural effusions.  Increase Lasix to twice daily, continue strict ins and outs, daily weights, wean off oxygen as tolerated.  She seems to be quite deconditioned, PT consult  Active Problems Left lower lobe pneumonia - chest x-ray was also concerning for left lobar pneumonia, which is certainly possible given cough and URI type symptoms prior to her presentation.  Covered with ceftriaxone and azithromycin, favor treating no more than 5 days.  Probably repeat the chest x-ray tomorrow and if findings of pneumonia disappeared can stop antibiotics  Longstanding persistent A. fib-continue anticoagulation with Eliquis, continue metoprolol for rate control  Acute kidney  injury on chronic kidney disease stage IIIa -Baseline creatinine 1.34-1.48 in 2018, on presentation 1.98.  After receiving Lasix improved to 1.87, continue diuresis.  I do wonder whether baseline is accurate given that it was 4 years ago and she may be slightly higher now  Essential hypertension-hold ARB given elevation in creatinine, hold Norvasc given soft blood pressure on admission.  Continue beta-blockers along with Lasix.  Hypothyroidism-TSH was checked and it was normal at 1.49, continue Synthroid  Prolonged QT - 501 on admission EKG, noted, avoid QT prolonging agents   Scheduled Meds: . apixaban  5 mg Oral BID  . furosemide  40 mg Intravenous Daily  . guaiFENesin  600 mg Oral BID  . levothyroxine  125 mcg Oral Q0600  . metoprolol tartrate  12.5 mg Oral BID  . pantoprazole  40 mg Oral Daily  . pravastatin  80 mg Oral q AM  . senna  1 tablet Oral BID  . sodium chloride flush  3 mL Intravenous Q12H   Continuous Infusions: . sodium chloride    . azithromycin    . cefTRIAXone (ROCEPHIN)  IV     PRN Meds:.sodium chloride, acetaminophen **OR** acetaminophen, albuterol, bisacodyl, HYDROcodone-acetaminophen, polyethylene glycol, sodium chloride flush  Diet Orders (From admission, onward)    Start     Ordered   01/22/21 2348  Diet Heart Room service appropriate? Yes; Fluid consistency: Thin  Diet effective now       Question Answer Comment  Room service appropriate? Yes   Fluid consistency: Thin  01/22/21 2347          DVT prophylaxis:  apixaban (ELIQUIS) tablet 5 mg     Code Status: Full Code  Family Communication: no family at bedside   Status is: Inpatient  Remains inpatient appropriate because:Inpatient level of care appropriate due to severity of illness   Dispo: The patient is from: Home              Anticipated d/c is to: Home              Anticipated d/c date is: 3 days              Patient currently is not medically stable to d/c.   Difficult to  place patient No  Level of care: Telemetry Cardiac  Consultants:  None   Procedures:  2D echo: pending  Microbiology  none  Antimicrobials: Ceftriaxone / Azithromycin 2/21 >>    Objective: Vitals:   01/22/21 2345 01/23/21 0033 01/23/21 0457 01/23/21 0500  BP: 112/68 126/69 113/70   Pulse: (!) 59 82 75   Resp: 15 20 18    Temp:      TempSrc:      SpO2: 100% 96% 91%   Weight:  118.9 kg  118.9 kg  Height:  5\' 4"  (1.626 m)      Intake/Output Summary (Last 24 hours) at 01/23/2021 0913 Last data filed at 01/23/2021 0501 Gross per 24 hour  Intake 568.27 ml  Output -  Net 568.27 ml   Filed Weights   01/22/21 1444 01/23/21 0033 01/23/21 0500  Weight: 104.3 kg 118.9 kg 118.9 kg    Examination:  Constitutional: NAD Eyes: no scleral icterus ENMT: Mucous membranes are moist.  Neck: normal, supple Respiratory: Faint bibasilar crackles, no wheezing. Normal respiratory effort.  Cardiovascular: Irregular, 2+ pitting edema Abdomen: non distended, no tenderness. Bowel sounds positive.  Musculoskeletal: no clubbing / cyanosis.  Skin: no rashes Neurologic: CN 2-12 grossly intact. Strength 5/5 in all 4.  Psychiatric: Normal judgment and insight. Alert and oriented x 3. Normal mood.   Data Reviewed: I have independently reviewed following labs and imaging studies   CBC: Recent Labs  Lab 01/22/21 1445 01/23/21 0110  WBC 13.0* 12.4*  NEUTROABS  --  10.3*  HGB 10.9* 11.1*  HCT 38.8 36.9  MCV 73.6* 72.6*  PLT 465* 161*   Basic Metabolic Panel: Recent Labs  Lab 01/22/21 1445 01/23/21 0110 01/23/21 0442  NA 136 134*  --   K 4.3 6.0* 4.2  CL 95* 98  --   CO2 31 20*  --   GLUCOSE 110* 101*  --   BUN 49* 50*  --   CREATININE 1.98* 1.87*  --   CALCIUM 8.9 8.4*  --   MG  --  2.1  --   PHOS  --  5.0*  --    Liver Function Tests: Recent Labs  Lab 01/23/21 0110  AST 65*  ALT 19  ALKPHOS 52  BILITOT 1.0  PROT 6.2*  ALBUMIN 3.1*   Coagulation Profile: No  results for input(s): INR, PROTIME in the last 168 hours. HbA1C: No results for input(s): HGBA1C in the last 72 hours. CBG: No results for input(s): GLUCAP in the last 168 hours.  Recent Results (from the past 240 hour(s))  SARS CORONAVIRUS 2 (TAT 6-24 HRS) Nasopharyngeal Nasopharyngeal Swab     Status: None   Collection Time: 01/22/21  4:12 PM   Specimen: Nasopharyngeal Swab  Result Value Ref Range Status  SARS Coronavirus 2 NEGATIVE NEGATIVE Final    Comment: (NOTE) SARS-CoV-2 target nucleic acids are NOT DETECTED.  The SARS-CoV-2 RNA is generally detectable in upper and lower respiratory specimens during the acute phase of infection. Negative results do not preclude SARS-CoV-2 infection, do not rule out co-infections with other pathogens, and should not be used as the sole basis for treatment or other patient management decisions. Negative results must be combined with clinical observations, patient history, and epidemiological information. The expected result is Negative.  Fact Sheet for Patients: SugarRoll.be  Fact Sheet for Healthcare Providers: https://www.woods-mathews.com/  This test is not yet approved or cleared by the [place] FDA and  has been authorized for detection and/or diagnosis of SARS-CoV-2 by FDA under an Emergency Use Authorization (EUA). This EUA will remain  in effect (meaning this test can be used) for the duration of the COVID-19 declaration under Se ction 564(b)(1) of the Act, 21 U.S.C. section 360bbb-3(b)(1), unless the authorization is terminated or revoked sooner.  Performed at [facility], [place] [address], [place], [place] [address]      Radiology Studies: DG Chest 2 View  Result Date: 01/22/2021 CLINICAL DATA:  Shortness of breath EXAM: CHEST - 2 VIEW COMPARISON:  04/16/2017 FINDINGS: Low lung volumes. Suspected trace right pleural effusion. Airspace disease at the left lung base with  probable small effusion. Enlarged cardiomediastinal silhouette. No pneumothorax. IMPRESSION: Cardiomegaly with low lung volumes, suspected small right and small moderate left pleural effusions, and left basilar consolidation which may reflect atelectasis or pneumonia. Electronically Signed   By: Donavan Foil M.D.   On: 01/22/2021 15:24    Marzetta Board, MD, PhD [facility]  Between 7 am - 7 pm I am available, please contact me via Amion or Securechat  Between 7 pm - 7 am I am not available, please contact night coverage MD/APP via Amion

## 2021-01-23 NOTE — Progress Notes (Signed)
Eval complete formal note pending- will benefit from HHPT, do not anticipate any DME needs.   Windell Norfolk, DPT, PN1   Supplemental Physical Therapist [facility]    Pager [phone] Acute Rehab Office [phone]

## 2021-01-23 NOTE — Progress Notes (Signed)
Report given to Sharrie Rothman, RN on [facility].

## 2021-01-23 NOTE — Evaluation (Signed)
Physical Therapy Evaluation Patient Details Name: [name] MRN: [id number] DOB: 02/07/1946 Today's Date: 01/23/2021   History of Present Illness  [name] is a 75 y.o. female with medical history significant of hypertension, persistent atrial fibrillation, obesity, and diastolic CHF who presented with worsening dyspenea, SpO2 84% on RA at cardiology appointment.    Clinical Impression  Pt received sitting in chair, sleeping but easily woken and willing to participate in PT. Pt's husband present for entire session. Pt reporting slurred speech with unclear timeline and unclear what her baseline was. No signs of acute event, but RN and MD notified. MD arriving during session and pt cleared for activity with PT. Pt min guard for safety for all mobility including ~80 ft ambulation with rollator. Correct hand placement and safe navigation of rollator during transfers and ambulation. Follows commands with increased time. Trialed RA, but dropped to 77% in sitting. SPO2 rose to 92% on 3L after > 66mn. SPO2 >93% during ambulation. Pt's husband able to provide 24/7 support and pt with adequate DME at home. Recommend HHPT to address endurance and strength. Pt and husband agreeable to plan. Pt left with all needs met, chair alarm active, call bell within reach, RN aware of status, and husband present in room.    Follow Up Recommendations Home health PT    Equipment Recommendations  None recommended by PT    Recommendations for Other Services       Precautions / Restrictions Precautions Precautions: Fall Precaution Comments: Monitor O2 Restrictions Weight Bearing Restrictions: No      Mobility  Bed Mobility               General bed mobility comments: Pt received sitting on EOB    Transfers Overall transfer level: Needs assistance Equipment used: 4-wheeled walker (rollator) Transfers: Sit to/from Stand Sit to Stand: Min guard         General transfer comment: Min guard  for safety, correct hand placement to push from chair without additional cueing needed, remembered to lock brakes on rollator before standing/sitting down with no cueing needed  Ambulation/Gait Ambulation/Gait assistance: Min guard Gait Distance (Feet): 80 Feet Assistive device:  (rollator) Gait Pattern/deviations: Step-to pattern;Decreased step length - right;Decreased step length - left;Trunk flexed Gait velocity: Decreased   General Gait Details: Slow, but steady. Min guard for safety. No physical assist give. Safe navigation of RW. Minimal cues for pacing.  Stairs            Wheelchair Mobility    Modified Rankin (Stroke Patients Only)       Balance Overall balance assessment: Needs assistance Sitting-balance support: Feet supported Sitting balance-Leahy Scale: Good     Standing balance support: Single extremity supported;During functional activity Standing balance-Leahy Scale: Poor Standing balance comment: Reliant on B UE support in standing                             Pertinent Vitals/Pain Pain Assessment: No/denies pain    Home Living Family/patient expects to be discharged to:: Private residence Living Arrangements: Spouse/significant other Available Help at Discharge: Family;Available 24 hours/day Type of Home: House Home Access: Stairs to enter;Ramped entrance   Entrance Stairs-Number of Steps: 3 Home Layout: Two level;Able to live on main level with bedroom/bathroom Home Equipment: Grab bars - toilet;Walker - 4 wheels;Walker - 2 wheels;Bedside commode;Hand held shower head;Other (comment);Wheelchair - manual (lift chair, pulse ox) Additional Comments: uses RW to hold on  to in shower    Prior Function Level of Independence: Needs assistance   Gait / Transfers Assistance Needed: uses Rollator for mobility, denies any falls. Husband pushes pt in wheelchair if going out shopping, etc  ADL's / Homemaking Assistance Needed: Able to complete  ADLs without assist. Husband completes majority of IADLs. Pt still drives but husband drives most of the time        Hand Dominance   Dominant Hand: Right    Extremity/Trunk Assessment   Upper Extremity Assessment Upper Extremity Assessment: Defer to OT evaluation    Lower Extremity Assessment Lower Extremity Assessment: Generalized weakness    Cervical / Trunk Assessment Cervical / Trunk Assessment: Kyphotic  Communication   Communication: No difficulties  Cognition Arousal/Alertness: Awake/alert Behavior During Therapy: WFL for tasks assessed/performed Overall Cognitive Status: Impaired/Different from baseline Area of Impairment: Awareness;Safety/judgement;Problem solving;Memory                     Memory: Decreased short-term memory   Safety/Judgement: Decreased awareness of deficits Awareness: Emergent Problem Solving: Slow processing;Requires verbal cues;Requires tactile cues General Comments: A&Ox4 (self, month/year, place, situation). Pt answers questions appropriately with increased time. Making jokes and laughed appropriately at other jokes. Did report that she felt "sorry for yelling" at staff last night. Unable to confirm what exactly happened last night, but pt reporting she yelled at staff about her IV. Accordingly to husband, generally close to baseline      General Comments General comments (skin integrity, edema, etc.): Received on 2 L O2. Trialed RA in sitting, SPO2 dropped to 77%, rose to 92% on 3L O2 after > 12mn. SPO2 no less than 93% on 3 L during transfers and ambulation. Cueing to breathe in through nose. Pt reported slurred speech, unclear of timeline of when it began, unclear of baseline speech. RN & MD notified. Strength screen relatively symmetrical, no reports of vision changes, no signs of facial asymmetry. MD arriving during PT session. Pt was medically cleared by team for general activity with PT. No signficant findings per MD, husband  reporting overall pt is close to baseline except for she doesn't normally use supplemental O2 at home. Both pt and husband report pt has SOB recently and they have been trying to monitor it with the pulse ox. Weren't able to remember readings on pulse ox, but tried to maintain over 90%    Exercises     Assessment/Plan    PT Assessment Patient needs continued PT services  PT Problem List Decreased strength;Decreased mobility;Decreased coordination;Decreased activity tolerance;Decreased cognition;Decreased balance;Decreased knowledge of use of DME       PT Treatment Interventions DME instruction;Gait training;Therapeutic exercise;Balance training;Neuromuscular re-education;Functional mobility training;Cognitive remediation;Therapeutic activities;Patient/family education    PT Goals (Current goals can be found in the Care Plan section)  Acute Rehab PT Goals Patient Stated Goal: walk without feeling out of breath PT Goal Formulation: With patient Time For Goal Achievement: 02/07/21 Potential to Achieve Goals: Good    Frequency Min 3X/week   Barriers to discharge        Co-evaluation               AM-PAC PT "6 Clicks" Mobility  Outcome Measure Help needed turning from your back to your side while in a flat bed without using bedrails?: None Help needed moving from lying on your back to sitting on the side of a flat bed without using bedrails?: None Help needed moving to and from a bed to a chair (  including a wheelchair)?: A Little Help needed standing up from a chair using your arms (e.g., wheelchair or bedside chair)?: None Help needed to walk in hospital room?: A Little Help needed climbing 3-5 steps with a railing? : A Lot 6 Click Score: 20    End of Session Equipment Utilized During Treatment: Gait belt;Oxygen Activity Tolerance: Patient tolerated treatment well Patient left: in chair;with chair alarm set;with family/visitor present;with call bell/phone within  reach Nurse Communication: Mobility status PT Visit Diagnosis: Unsteadiness on feet (R26.81);Muscle weakness (generalized) (M62.81)    Time:  -      Charges:              Rosita Kea, SPT

## 2021-01-24 ENCOUNTER — Encounter (HOSPITAL_COMMUNITY)
Admission: EM | Disposition: A | Discharge status: Home Health | Payer: Self-pay | Source: Home / Self Care | Attending: Internal Medicine

## 2021-01-24 ENCOUNTER — Inpatient Hospital Stay (HOSPITAL_COMMUNITY): Payer: Medicare HMO

## 2021-01-24 ENCOUNTER — Encounter (HOSPITAL_COMMUNITY): Payer: Self-pay | Admitting: Cardiology

## 2021-01-24 DIAGNOSIS — R9431 Abnormal electrocardiogram [ECG] [EKG]: Secondary | ICD-10-CM

## 2021-01-24 DIAGNOSIS — J189 Pneumonia, unspecified organism: Principal | ICD-10-CM

## 2021-01-24 DIAGNOSIS — I313 Pericardial effusion (noninflammatory): Secondary | ICD-10-CM

## 2021-01-24 DIAGNOSIS — I4811 Longstanding persistent atrial fibrillation: Secondary | ICD-10-CM

## 2021-01-24 HISTORY — PX: PERICARDIOCENTESIS: CATH118255

## 2021-01-24 LAB — BODY FLUID CELL COUNT WITH DIFFERENTIAL
Eos, Fluid: 0 %
Lymphs, Fluid: 2 %
Monocyte-Macrophage-Serous Fluid: 34 % — ABNORMAL LOW (ref 50–90)
Neutrophil Count, Fluid: 64 % — ABNORMAL HIGH (ref 0–25)
Total Nucleated Cell Count, Fluid: 4350 cu mm — ABNORMAL HIGH (ref 0–1000)

## 2021-01-24 LAB — CBC
HCT: 35.4 % — ABNORMAL LOW (ref 36.0–46.0)
Hemoglobin: 10.4 g/dL — ABNORMAL LOW (ref 12.0–15.0)
MCH: 21.4 pg — ABNORMAL LOW (ref 26.0–34.0)
MCHC: 29.4 g/dL — ABNORMAL LOW (ref 30.0–36.0)
MCV: 72.7 fL — ABNORMAL LOW (ref 80.0–100.0)
Platelets: 417 10*3/uL — ABNORMAL HIGH (ref 150–400)
RBC: 4.87 MIL/uL (ref 3.87–5.11)
RDW: 18 % — ABNORMAL HIGH (ref 11.5–15.5)
WBC: 13.7 10*3/uL — ABNORMAL HIGH (ref 4.0–10.5)
nRBC: 0 % (ref 0.0–0.2)

## 2021-01-24 LAB — PROCALCITONIN: Procalcitonin: <0.1 ng/mL

## 2021-01-24 LAB — BASIC METABOLIC PANEL
Anion gap: 14 (ref 5–15)
BUN: 49 mg/dL — ABNORMAL HIGH (ref 8–23)
CO2: 25 mmol/L (ref 22–32)
Calcium: 7.9 mg/dL — ABNORMAL LOW (ref 8.9–10.3)
Chloride: 94 mmol/L — ABNORMAL LOW (ref 98–111)
Creatinine, Ser: 1.87 mg/dL — ABNORMAL HIGH (ref 0.44–1.00)
GFR, Estimated: 28 mL/min — ABNORMAL LOW (ref >60)
Glucose, Bld: 98 mg/dL (ref 70–99)
Potassium: 4.8 mmol/L (ref 3.5–5.1)
Sodium: 133 mmol/L — ABNORMAL LOW (ref 135–145)

## 2021-01-24 LAB — CREATININE, URINE, RANDOM: Creatinine, Urine: 89.78 mg/dL

## 2021-01-24 LAB — SODIUM, URINE, RANDOM: Sodium, Ur: 39 mmol/L

## 2021-01-24 SURGERY — PERICARDIOCENTESIS
Anesthesia: LOCAL

## 2021-01-24 MED ORDER — SODIUM CHLORIDE 0.9 % IV SOLN: 250.0000 mL | INTRAVENOUS | Status: DC | PRN

## 2021-01-24 MED ORDER — SODIUM CHLORIDE 0.9% FLUSH: 3.0000 mL | Freq: Two times a day (BID) | INTRAVENOUS | Status: DC

## 2021-01-24 MED ORDER — MIDAZOLAM HCL 2 MG/2ML IJ SOLN
INTRAMUSCULAR | Status: AC
  Filled 2021-01-24: qty 2

## 2021-01-24 MED ORDER — ASPIRIN 81 MG PO CHEW: 81.0000 mg | CHEWABLE_TABLET | ORAL | Status: DC

## 2021-01-24 MED ORDER — HEPARIN (PORCINE) IN NACL 1000-0.9 UT/500ML-% IV SOLN
INTRAVENOUS | Status: AC
  Filled 2021-01-24: qty 500

## 2021-01-24 MED ORDER — SODIUM CHLORIDE 0.9 % IV SOLN: INTRAVENOUS | Status: DC

## 2021-01-24 MED ORDER — FERROUS SULFATE 325 (65 FE) MG PO TABS
325.0000 mg | ORAL_TABLET | Freq: Two times a day (BID) | ORAL | Status: DC
  Administered 2021-01-24 – 2021-01-30 (×10): 325 mg via ORAL
  Filled 2021-01-24 (×10): qty 1

## 2021-01-24 MED ORDER — SODIUM CHLORIDE 0.9% FLUSH: 3.0000 mL | INTRAVENOUS | Status: DC | PRN

## 2021-01-24 MED ORDER — SODIUM CHLORIDE 0.9 % IV SOLN
INTRAVENOUS | Status: AC | PRN
  Administered 2021-01-24: 10 mL/h via INTRAVENOUS

## 2021-01-24 MED ORDER — MIDAZOLAM HCL 2 MG/2ML IJ SOLN
INTRAMUSCULAR | Status: DC | PRN
  Administered 2021-01-24: 1 mg via INTRAVENOUS

## 2021-01-24 MED ORDER — LIDOCAINE HCL (PF) 1 % IJ SOLN
INTRAMUSCULAR | Status: DC | PRN
  Administered 2021-01-24: 10 mL

## 2021-01-24 MED ORDER — HEPARIN (PORCINE) IN NACL 1000-0.9 UT/500ML-% IV SOLN
INTRAVENOUS | Status: DC | PRN
  Administered 2021-01-24: 500 mL

## 2021-01-24 SURGICAL SUPPLY — 8 items
BAG SNAP BAND KOVER 36X36 (MISCELLANEOUS) ×2 IMPLANT
COVER DOME SNAP 22 D (MISCELLANEOUS) ×2 IMPLANT
MAT PREVALON FULL STRYKER (MISCELLANEOUS) ×2 IMPLANT
PACK CARDIAC CATHETERIZATION (CUSTOM PROCEDURE TRAY) ×2 IMPLANT
PROTECTION STATION PRESSURIZED (MISCELLANEOUS) ×2
SHEATH PROBE COVER 6X72 (BAG) ×2 IMPLANT
STATION PROTECTION PRESSURIZED (MISCELLANEOUS) ×1 IMPLANT
TRAY PERICARDIOCENTESIS 6FX60 (TRAY / TRAY PROCEDURE) ×2 IMPLANT

## 2021-01-24 NOTE — Progress Notes (Addendum)
PROGRESS NOTE    JARITA RAVAL   YHC:[id number]  DOB: 10/30/1946  DOA: 01/22/2021 PCP: Shon Baton, MD   Brief Narrative:  [name] is a 75 year old woman with hypertension, diastolic heart failure, persistent atrial fibrillation and obesity who presents to the hospital with severe shortness of breath with minimal exertion.  She admits to having sore throat and cough.  She was evaluated in the cardiology office on 2/21 and found to have a pulse ox of 84% on room air and sent to the ED.  In the ED, she was felt to have acute exacerbation of her congestive heart failure and a possible left lower lobe pneumonia.  She was admitted for further treatment and cardiology was consulted.   Subjective: Feeling short of breath when moving around however not as bad as when she first came to the hospital.    Assessment & Plan:   Principal Problem:   Acute respiratory failure with hypoxia  -Being treated for left lower lobe pneumonia and acute congestive heart failure  Active Problems: Left lower lobe pneumonia -She is receiving ceftriaxone and azithromycin with a plan for 5 days of treatment -Her cough has improved a little  Acute exacerbation of diastolic heart failure -The patient being diuresed with IV furosemide  Moderate to large pericardial effusion --2D echo revealed a moderate to severe pericardial effusion-she has undergone a pericardiocentesis today    Essential hypertension -Home medications include amlodipine, atenolol, losartan -Patient is currently receiving metoprolol    Longstanding persistent atrial fibrillation  -Continue metoprolol -Eliquis has been held for pericardiocentesis  Hypothyroidism -Continue Synthroid    Anemia microcytic -Iron saturation is low at 7 -Start oral Iron    AKI (acute kidney injury)-CKD stage III -Creatinine noted to be 1.98 when the patient was admitted on 2/21 -Her baseline creatinine appears to be in between 1.3-1.5 -Continue  to follow creatinine level     Prolonged QT interval -Avoid QT prolonging medications   Time spent in minutes: 35 DVT prophylaxis:  SCDs Code Status: Full code Family Communication: Family at bedside Level of Care: Level of care: Progressive Disposition Plan:  Status is: Inpatient  Remains inpatient appropriate because:IV treatments appropriate due to intensity of illness or inability to take PO   Dispo: The patient is from: Home              Anticipated d/c is to: Home              Anticipated d/c date is: 2 days              Patient currently is not medically stable to d/c.   Difficult to place patient No      Consultants:   Cardiology Procedures:   2 D ECHO Antimicrobials:  Anti-infectives (From admission, onward)   Start     Dose/Rate Route Frequency Ordered Stop   01/23/21 1830  cefTRIAXone (ROCEPHIN) 1 g in sodium chloride 0.9 % 100 mL IVPB        1 g 200 mL/hr over 30 Minutes Intravenous  Once 01/22/21 2347 01/23/21 1800   01/23/21 1800  azithromycin (ZITHROMAX) 500 mg in sodium chloride 0.9 % 250 mL IVPB        500 mg 250 mL/hr over 60 Minutes Intravenous  Once 01/22/21 2347 01/23/21 1903   01/22/21 1745  cefTRIAXone (ROCEPHIN) 1 g in sodium chloride 0.9 % 100 mL IVPB        1 g 200 mL/hr over 30 Minutes Intravenous  Once 01/22/21 1738 01/22/21 1923   01/22/21 1745  azithromycin (ZITHROMAX) 500 mg in sodium chloride 0.9 % 250 mL IVPB        500 mg 250 mL/hr over 60 Minutes Intravenous  Once 01/22/21 1738 01/22/21 2143       Objective: Vitals:   01/24/21 0801 01/24/21 0828 01/24/21 0955 01/24/21 1115  BP: 99/75   108/77  Pulse: 98 91  84  Resp: 17   19  Temp: (!) 97.4 F (36.3 C)   (!) 97.2 F (36.2 C)  TempSrc: Oral   Oral  SpO2: 100%  95% 95%  Weight:      Height:        Intake/Output Summary (Last 24 hours) at 01/24/2021 1228 Last data filed at 01/24/2021 0530 Gross per 24 hour  Intake 246.43 ml  Output 850 ml  Net -603.57 ml   Filed  Weights   01/23/21 0033 01/23/21 0500 01/24/21 0534  Weight: 118.9 kg 118.9 kg 118.2 kg    Examination: General exam: Appears comfortable  HEENT: PERRLA, oral mucosa moist, no sclera icterus or thrush Respiratory system: crackles at bases Cardiovascular system: S1 & S2 heard, RRR.   Gastrointestinal system: Abdomen soft, non-tender, nondistended. Normal bowel sounds. Central nervous system: Alert and oriented. No focal neurological deficits. Extremities: No cyanosis, clubbing - 2+ pitting edema in legs Skin: No rashes or ulcers Psychiatry:  Mood & affect appropriate.     Data Reviewed: I have personally reviewed following labs and imaging studies  CBC: Recent Labs  Lab 01/22/21 1445 01/23/21 0110 01/24/21 0121  WBC 13.0* 12.4* 13.7*  NEUTROABS  --  10.3*  --   HGB 10.9* 11.1* 10.4*  HCT 38.8 36.9 35.4*  MCV 73.6* 72.6* 72.7*  PLT 465* 438* 505*   Basic Metabolic Panel: Recent Labs  Lab 01/22/21 1445 01/23/21 0110 01/23/21 0442 01/24/21 0121  NA 136 134*  --  133*  K 4.3 6.0* 4.2 4.8  CL 95* 98  --  94*  CO2 31 20*  --  25  GLUCOSE 110* 101*  --  98  BUN 49* 50*  --  49*  CREATININE 1.98* 1.87*  --  1.87*  CALCIUM 8.9 8.4*  --  7.9*  MG  --  2.1  --   --   PHOS  --  5.0*  --   --    GFR: Estimated Creatinine Clearance: 32.9 mL/min (A) (by C-G formula based on SCr of 1.87 mg/dL (H)). Liver Function Tests: Recent Labs  Lab 01/23/21 0110  AST 65*  ALT 19  ALKPHOS 52  BILITOT 1.0  PROT 6.2*  ALBUMIN 3.1*   No results for input(s): LIPASE, AMYLASE in the last 168 hours. No results for input(s): AMMONIA in the last 168 hours. Coagulation Profile: No results for input(s): INR, PROTIME in the last 168 hours. Cardiac Enzymes: No results for input(s): CKTOTAL, CKMB, CKMBINDEX, TROPONINI in the last 168 hours. BNP (last 3 results) No results for input(s): PROBNP in the last 8760 hours. HbA1C: No results for input(s): HGBA1C in the last 72 hours. CBG: No  results for input(s): GLUCAP in the last 168 hours. Lipid Profile: No results for input(s): CHOL, HDL, LDLCALC, TRIG, CHOLHDL, LDLDIRECT in the last 72 hours. Thyroid Function Tests: Recent Labs    01/23/21 0110  TSH 1.499   Anemia Panel: Recent Labs    01/22/21 2039  VITAMINB12 1,020*  FOLATE 11.2  FERRITIN 70  TIBC 466*  IRON 32  RETICCTPCT  2.0   Urine analysis:    Component Value Date/Time   COLORURINE STRAW (A) 05/20/2017 1105   APPEARANCEUR CLEAR 05/20/2017 1105   LABSPEC 1.005 05/20/2017 1105   PHURINE 7.0 05/20/2017 1105   GLUCOSEU NEGATIVE 05/20/2017 1105   HGBUR SMALL (A) 05/20/2017 1105   BILIRUBINUR NEGATIVE 05/20/2017 1105   KETONESUR NEGATIVE 05/20/2017 1105   PROTEINUR NEGATIVE 05/20/2017 1105   UROBILINOGEN 0.2 01/31/2011 1208   NITRITE NEGATIVE 05/20/2017 1105   LEUKOCYTESUR MODERATE (A) 05/20/2017 1105   Sepsis Labs: @LABRCNTIP (procalcitonin:4,lacticidven:4) ) Recent Results (from the past 240 hour(s))  SARS CORONAVIRUS 2 (TAT 6-24 HRS) Nasopharyngeal Nasopharyngeal Swab     Status: None   Collection Time: 01/22/21  4:12 PM   Specimen: Nasopharyngeal Swab  Result Value Ref Range Status   SARS Coronavirus 2 NEGATIVE NEGATIVE Final    Comment: (NOTE) SARS-CoV-2 target nucleic acids are NOT DETECTED.  The SARS-CoV-2 RNA is generally detectable in upper and lower respiratory specimens during the acute phase of infection. Negative results do not preclude SARS-CoV-2 infection, do not rule out co-infections with other pathogens, and should not be used as the sole basis for treatment or other patient management decisions. Negative results must be combined with clinical observations, patient history, and epidemiological information. The expected result is Negative.  Fact Sheet for Patients: SugarRoll.be  Fact Sheet for Healthcare Providers: https://www.woods-mathews.com/  This test is not yet approved or  cleared by the [place] FDA and  has been authorized for detection and/or diagnosis of SARS-CoV-2 by FDA under an Emergency Use Authorization (EUA). This EUA will remain  in effect (meaning this test can be used) for the duration of the COVID-19 declaration under Se ction 564(b)(1) of the Act, 21 U.S.C. section 360bbb-3(b)(1), unless the authorization is terminated or revoked sooner.  Performed at [facility], [place] [address] [place], [place] [address]          Radiology Studies: DG Chest 2 View  Result Date: 01/22/2021 CLINICAL DATA:  Shortness of breath EXAM: CHEST - 2 VIEW COMPARISON:  04/16/2017 FINDINGS: Low lung volumes. Suspected trace right pleural effusion. Airspace disease at the left lung base with probable small effusion. Enlarged cardiomediastinal silhouette. No pneumothorax. IMPRESSION: Cardiomegaly with low lung volumes, suspected small right and small moderate left pleural effusions, and left basilar consolidation which may reflect atelectasis or pneumonia. Electronically Signed   By: Donavan Foil M.D.   On: 01/22/2021 15:24   CARDIAC CATHETERIZATION  Result Date: 01/24/2021 Pericardiocentesis 01/24/2021: Successful ultrasound and radiographic and EKG assisted pericardiocentesis with successful aspiration of hemorrhagic 500 mL of fluid. Fluid sent for Gram stain and cytology. Adrian Prows, MD, [facility] 01/24/2021, 10:31 AM Office: [phone] Pager: [phone]   ECHOCARDIOGRAM COMPLETE  Result Date: 01/23/2021    ECHOCARDIOGRAM REPORT   Patient Name:   [name] Date of Exam: 01/23/2021 Medical Rec #:  [id number]      Height:       64.0 in Accession #:    [id number]     Weight:       262.1 lb Date of Birth:  07/19/1946       BSA:          2.195 m Patient Age:    89 years       BP:           111/64 mmHg Patient Gender: F              HR:  73 bpm. Exam Location:  Inpatient Procedure: 2D Echo Indications:     acute diastolic chf  History:         Patient  has no prior history of Echocardiogram examinations.                  Chronic kidney disease, Arrythmias:Atrial Fibrillation,                  Signs/Symptoms:leg edema; Risk Factors:Hypertension.  Sonographer:     Johny Chess RDCS Referring Phys:  Geryl Rankins DOUTOVA Diagnosing Phys: Vernell Leep MD IMPRESSIONS  1. Left ventricular ejection fraction, by estimation, is 50 to 55%. The left ventricle has low normal function. The left ventricle has no regional wall motion abnormalities. Left ventricular diastolic parameters are indeterminate. There is Respiratory bounce concerning for tamponade physiology.  2. Right ventricular systolic function is normal. The right ventricular size is normal. There is normal pulmonary artery systolic pressure.  3. Left atrial size was mildly dilated.  4. Right atrial size was mildly dilated.  5. The mitral valve is grossly normal. Mild mitral valve regurgitation.  6. Aortic valve is probably trileaflet with moderate calcification. Inadequate Doppler evaluation to evaluate aortic stenosis. . The aortic valve is calcified. Aortic valve regurgitation is not visualized.  7. The inferior vena cava is dilated in size with <50% respiratory variability, suggesting right atrial pressure of 15 mmHg.  8. Moderate to large circumferential pericardial effusion. Largest dimension measures 2.5 cm anterior to RV in subcostal window. Mitral and tricuspid inflow respiratory variation, dilated IVC, and interventricular septal boucne concerning for tamponade physiology. Clinical correlation recommended. . Moderate pericardial effusion.  9. Compared to previous outpatient study in 2018, pericardial effusion is new. FINDINGS  Left Ventricle: Left ventricular ejection fraction, by estimation, is 50 to 55%. The left ventricle has low normal function. The left ventricle has no regional wall motion abnormalities. The left ventricular internal cavity size was normal in size. There is no left  ventricular hypertrophy. Respiratory bounce concerning for tamponade physiology. Left ventricular diastolic parameters are indeterminate. Right Ventricle: The right ventricular size is normal. No increase in right ventricular wall thickness. Right ventricular systolic function is normal. There is normal pulmonary artery systolic pressure. The tricuspid regurgitant velocity is 2.47 m/s, and  with an assumed right atrial pressure of 10 mmHg, the estimated right ventricular systolic pressure is 78.2 mmHg. Left Atrium: Left atrial size was mildly dilated. Right Atrium: Right atrial size was mildly dilated. Pericardium: Moderate to large circumferential pericardial effusion. Largest dimension measures 2.5 cm anterior to RV in subcostal window. Mitral and tricuspid inflow respiratory variation, dilated IVC, and interventricular septal boucne concerning for tamponade physiology. Clinical correlation recommended. A moderately sized pericardial effusion is present. There is excessive respiratory variation in septal movement. Mitral Valve: The mitral valve is grossly normal. Mild mitral valve regurgitation. Tricuspid Valve: The tricuspid valve is grossly normal. Tricuspid valve regurgitation is mild. Aortic Valve: Aortic valve is probably trileaflet with moderate calcification. Inadequate Doppler evaluation to evaluate aortic stenosis. The aortic valve is calcified. Aortic valve regurgitation is not visualized. Pulmonic Valve: The pulmonic valve was grossly normal. Pulmonic valve regurgitation is not visualized. Aorta: The aortic root and ascending aorta are structurally normal, with no evidence of dilitation. Venous: The inferior vena cava is dilated in size with less than 50% respiratory variability, suggesting right atrial pressure of 15 mmHg. IAS/Shunts: No atrial level shunt detected by color flow Doppler. Additional Comments: There is a small pleural effusion  in the left lateral region.  LEFT VENTRICLE PLAX 2D LVIDd:          4.40 cm LVIDs:         3.00 cm LV PW:         0.90 cm LV IVS:        0.90 cm LVOT diam:     1.80 cm LVOT Area:     2.54 cm  IVC IVC diam: 2.30 cm LEFT ATRIUM             Index       RIGHT ATRIUM           Index LA diam:        4.00 cm 1.82 cm/m  RA Area:     18.00 cm LA Vol (A2C):   56.4 ml 25.70 ml/m RA Volume:   45.60 ml  20.78 ml/m LA Vol (A4C):   77.2 ml 35.18 ml/m LA Biplane Vol: 67.7 ml 30.85 ml/m   AORTA Ao Root diam: 2.70 cm Ao Asc diam:  3.10 cm TRICUSPID VALVE TR Peak grad:   24.4 mmHg TR Vmax:        247.00 cm/s  SHUNTS Systemic Diam: 1.80 cm Vernell Leep MD Electronically signed by Vernell Leep MD Signature Date/Time: 01/23/2021/8:04:28 PM    Final       Scheduled Meds: . [MAR Hold] furosemide  40 mg Intravenous BID  . [MAR Hold] guaiFENesin  600 mg Oral BID  . [MAR Hold] levothyroxine  125 mcg Oral Q0600  . [MAR Hold] metoprolol tartrate  12.5 mg Oral BID  . [MAR Hold] pantoprazole  40 mg Oral Daily  . [MAR Hold] pravastatin  80 mg Oral q AM  . [MAR Hold] senna  1 tablet Oral BID  . [MAR Hold] sodium chloride flush  3 mL Intravenous Q12H   Continuous Infusions: . [MAR Hold] sodium chloride       LOS: 2 days      Debbe Odea, MD [facility] Pager: [email or web address] 01/24/2021, 12:28 PM

## 2021-01-24 NOTE — Progress Notes (Signed)
Stable this morning. BP 99/84, HR around 90 bpm Will plan for pericardiocentesis this morning. Risks/benefits/alternate options discussed with the patient and family at bedside. Keep NPO. Hold eliquis.   Nigel Mormon, MD Pager: [phone] Office: [phone]

## 2021-01-24 NOTE — Progress Notes (Signed)
Mobility Specialist: Progress Note   01/24/21 1559  Mobility  Activity Ambulated in hall  Level of Assistance Minimal assist, patient does 75% or more  Assistive Device Four wheel walker  Distance Ambulated (ft) 90 ft (50', 10', 30')  Mobility Response Tolerated poorly  Mobility performed by Mobility specialist  Bed Position Chair  $Mobility charge 1 Mobility   Pre-Mobility on 2 L/min [place]: 92 HR, 92% SpO2 During Mobility on 4 L/min [place]: 121 HR Post-Mobility on 4 L/min [place]: 114 HR, 97/65 (75) BP, 100% SpO2  Pt took two seated breaks lasting >1 minute each during ambulation due to feeling SOB and "whoozy." Unable to get SpO2 reading during ambulation. Pt back to chair after walk, RN present. Pt said she felt the room spinning while sitting in the chair. Coached pt through focusing on one location and breathing, resolved shortly after. RN notified.   [facility] Ameena Vesey Mobility Specialist Mobility Specialist Phone: [phone]

## 2021-01-24 NOTE — Interval H&P Note (Signed)
History and Physical Interval Note:  01/24/2021 9:53 AM  [name]  has presented today for surgery, with the diagnosis of chest pain.  The various methods of treatment have been discussed with the patient and family. After consideration of risks, benefits and other options for treatment, the patient has consented to  Procedure(s): PERICARDIOCENTESIS (N/A) as a surgical intervention.  The patient's history has been reviewed, patient examined, no change in status, stable for surgery.  I have reviewed the patient's chart and labs.  Questions were answered to the patient's satisfaction.     Adrian Prows

## 2021-01-24 NOTE — H&P (View-Only) (Signed)
Stable this morning. BP 99/84, HR around 90 bpm Will plan for pericardiocentesis this morning. Risks/benefits/alternate options discussed with the patient and family at bedside. Keep NPO. Hold eliquis.   Nigel Mormon, MD Pager: [phone] Office: [phone]

## 2021-01-24 NOTE — Progress Notes (Signed)
  Echocardiogram 2D Echocardiogram has been performed.  [name] 01/24/2021, 10:38 AM

## 2021-01-25 ENCOUNTER — Inpatient Hospital Stay (HOSPITAL_COMMUNITY): Payer: Medicare HMO

## 2021-01-25 LAB — BASIC METABOLIC PANEL
Anion gap: 10 (ref 5–15)
BUN: 46 mg/dL — ABNORMAL HIGH (ref 8–23)
CO2: 30 mmol/L (ref 22–32)
Calcium: 8.5 mg/dL — ABNORMAL LOW (ref 8.9–10.3)
Chloride: 96 mmol/L — ABNORMAL LOW (ref 98–111)
Creatinine, Ser: 1.84 mg/dL — ABNORMAL HIGH (ref 0.44–1.00)
GFR, Estimated: 28 mL/min — ABNORMAL LOW (ref >60)
Glucose, Bld: 83 mg/dL (ref 70–99)
Potassium: 4.2 mmol/L (ref 3.5–5.1)
Sodium: 136 mmol/L (ref 135–145)

## 2021-01-25 LAB — CYTOLOGY - NON PAP

## 2021-01-25 LAB — GLUCOSE, BODY FLUID OTHER: Glucose, Body Fluid Other: 49 mg/dL

## 2021-01-25 LAB — PROTEIN, BODY FLUID (OTHER): Total Protein, Body Fluid Other: 4.4 g/dL

## 2021-01-25 LAB — LD, BODY FLUID (OTHER): LD, Body Fluid: 941 IU/L

## 2021-01-25 LAB — CBC
HCT: 37 % (ref 36.0–46.0)
Hemoglobin: 10.4 g/dL — ABNORMAL LOW (ref 12.0–15.0)
MCH: 21 pg — ABNORMAL LOW (ref 26.0–34.0)
MCHC: 28.1 g/dL — ABNORMAL LOW (ref 30.0–36.0)
MCV: 74.6 fL — ABNORMAL LOW (ref 80.0–100.0)
Platelets: 402 10*3/uL — ABNORMAL HIGH (ref 150–400)
RBC: 4.96 MIL/uL (ref 3.87–5.11)
RDW: 18.6 % — ABNORMAL HIGH (ref 11.5–15.5)
WBC: 9.7 10*3/uL (ref 4.0–10.5)
nRBC: 0 % (ref 0.0–0.2)

## 2021-01-25 LAB — STREP PNEUMONIAE URINARY ANTIGEN: Strep Pneumo Urinary Antigen: NEGATIVE

## 2021-01-25 LAB — PATHOLOGIST SMEAR REVIEW

## 2021-01-25 NOTE — Progress Notes (Signed)
  Echocardiogram 2D Echocardiogram has been performed.  [name] 01/25/2021, 2:40 PM

## 2021-01-25 NOTE — Progress Notes (Signed)
Subjective:  Breathing better, but not back to normal Leg edema persists  Reviewed telemetry. Afib with ventricular rate around 100  Objective:  Vital Signs in the last 24 hours: Temp:  [97.2 F (36.2 C)-98.5 F (36.9 C)] 98.1 F (36.7 C) (02/24 0755) Pulse Rate:  [76-107] 92 (02/24 0755) Resp:  [14-20] 20 (02/24 0755) BP: (92-108)/(61-77) 93/62 (02/24 0755) SpO2:  [92 %-98 %] 97 % (02/24 0755) Weight:  [119.3 kg] 119.3 kg (02/24 0646)  Intake/Output from previous day: 02/23 0701 - 02/24 0700 In: 240 [P.O.:240] Out: 300 [Urine:300]  Physical Exam Vitals and nursing note reviewed.  Constitutional:      General: She is not in acute distress.    Appearance: She is well-developed. She is ill-appearing.  HENT:     Head: Normocephalic and atraumatic.  Eyes:     Conjunctiva/sclera: Conjunctivae normal.     Pupils: Pupils are equal, round, and reactive to light.  Neck:     Vascular: JVD present.  Cardiovascular:     Rate and Rhythm: Tachycardia present. Rhythm irregularly irregular.     Pulses: Normal pulses and intact distal pulses.     Heart sounds: Heart sounds are distant. Murmur heard.   Harsh midsystolic murmur is present with a grade of 2/6 at the upper right sternal border radiating to the neck.   Pulmonary:     Effort: Pulmonary effort is normal.     Breath sounds: Normal breath sounds. No wheezing or rales.  Abdominal:     General: Bowel sounds are normal.     Palpations: Abdomen is soft.     Tenderness: There is no rebound.  Musculoskeletal:        General: No tenderness. Normal range of motion.     Right lower leg: Edema (2+) present.     Left lower leg: Edema (2+) present.  Lymphadenopathy:     Cervical: No cervical adenopathy.  Skin:    General: Skin is warm and dry.  Neurological:     Mental Status: She is alert and oriented to person, place, and time.     Cranial Nerves: No cranial nerve deficit.      Lab Results: BMP Recent Labs     01/23/21 0110 01/23/21 0442 01/24/21 0121 01/25/21 0757  NA 134*  --  133* 136  K 6.0* 4.2 4.8 4.2  CL 98  --  94* 96*  CO2 20*  --  25 30  GLUCOSE 101*  --  98 83  BUN 50*  --  49* 46*  CREATININE 1.87*  --  1.87* 1.84*  CALCIUM 8.4*  --  7.9* 8.5*  GFRNONAA 28*  --  28* 28*    CBC Recent Labs  Lab 01/23/21 0110 01/24/21 0121 01/25/21 0757  WBC 12.4*   < > 9.7  RBC 5.08   < > 4.96  HGB 11.1*   < > 10.4*  HCT 36.9   < > 37.0  PLT 438*   < > 402*  MCV 72.6*   < > 74.6*  MCH 21.9*   < > 21.0*  MCHC 30.1   < > 28.1*  RDW 18.4*   < > 18.6*  LYMPHSABS 0.7  --   --   MONOABS 1.2*  --   --   EOSABS 0.1  --   --   BASOSABS 0.1  --   --    < > = values in this interval not displayed.    BNP (last 3 results) Recent  Labs    12/25/20 1311 01/22/21 1605  BNP 265.7* 310.6*    TSH Recent Labs    01/23/21 0110  TSH 1.499      Hepatic Function Panel Recent Labs    01/23/21 0110  PROT 6.2*  ALBUMIN 3.1*  AST 65*  ALT 19  ALKPHOS 52  BILITOT 1.0    Imaging: Chest Xray 01/25/2021: Stable cardiomegaly. Persistent airspace consolidation left lower lung region and left pleural effusion, similar to prior study. Equivocal right pleural effusion. Right lung otherwise clear. Aortic Atherosclerosis (ICD10-I70.0).  Cardiac Studies:  EKG 01/22/2021: Afib with controlled ventricular rate  Pericardiocentesis 01/24/2021: Successful ultrasound and radiographic and EKG assisted pericardiocentesis with successful aspiration of hemorrhagic 500 mL of fluid. Fluid sent for Gram stain and cytology.   Echocardiogram 01/23/2021: 1. Left ventricular ejection fraction, by estimation, is 50 to 55%. The  left ventricle has low normal function. The left ventricle has no regional  wall motion abnormalities. Left ventricular diastolic parameters are  indeterminate. There is Respiratory  bounce concerning for tamponade physiology.  2. Right ventricular systolic function is  normal. The right ventricular  size is normal. There is normal pulmonary artery systolic pressure.  3. Left atrial size was mildly dilated.  4. Right atrial size was mildly dilated.  5. The mitral valve is grossly normal. Mild mitral valve regurgitation.  6. Aortic valve is probably trileaflet with moderate calcification.  Inadequate Doppler evaluation to evaluate aortic stenosis. . The aortic  valve is calcified. Aortic valve regurgitation is not visualized.  7. The inferior vena cava is dilated in size with <50% respiratory  variability, suggesting right atrial pressure of 15 mmHg.  8. Moderate to large circumferential pericardial effusion. Largest  dimension measures 2.5 cm anterior to RV in subcostal window. Mitral and  tricuspid inflow respiratory variation, dilated IVC, and interventricular  septal boucne concerning for tamponade  physiology. Clinical correlation recommended. . Moderate pericardial  effusion.  9. Compared to previous outpatient study in 2018, pericardial effusion is  new.   Assessment & Recommendations:   75 year old Caucasian female with hypertension, persistent atrial fibrillation, moderate obesity, HFpEF, pericardial effusion  Pericardial effusion: Early tamponade on echo 01/23/2021, now s/p 500 cc hemorrhagic pericardiocentesis Cytology pending Repeat limited echo. If does not show pericardial fluid, consider left thoracentesis. Hold eliquis for now  HFpEF: Remains fluid overloaded. Requires oxygen Recommend lasix 40 mg IV bid  Afib: Rate controlled on low dose metoprolol. Hold eliquis for now, pending possibility of thoracentesis   Nigel Mormon, MD Pager: [phone] Office: [phone]     Nigel Mormon, MD Pager: [phone] Office: [phone]

## 2021-01-25 NOTE — Progress Notes (Signed)
Mobility Specialist - Progress Note   01/25/21 1517  Mobility  Activity Transferred to/from [facility]  Level of Assistance Minimal assist, patient does 75% or more  Assistive Device Front wheel walker  Distance Ambulated (ft) 4 ft  Mobility Response Tolerated well  Mobility performed by Mobility specialist  $Mobility charge 1 Mobility   Pre-mobility, 2L O2:       Sitting: 112 HR, 104/64 BP, 92% SpO2      Standing: 115 HR, 91/67 BP      Standing @ 3 min: 108 HR, 83/64 BP Post-mobility, 2L O2: 108 HR, 91/51 BP, 97% SPO2  Pt received on BSC, says she was helped by her husband due to an urgent BM. Orthostatic BP taken, she was asx but further mobility differed due drop of systolic BP. She required assist for pericare. Pt to recliner, family in room.   Pricilla Handler Mobility Specialist Mobility Specialist Phone: [phone]

## 2021-01-25 NOTE — Progress Notes (Signed)
Physical Therapy Treatment Patient Details Name: [name] MRN: [id number] DOB: 1946/07/23 Today's Date: 01/25/2021    History of Present Illness [name] is a 75 y.o. female with medical history significant of hypertension, persistent atrial fibrillation, obesity, and diastolic CHF who presented with worsening dyspenea, SpO2 84% on RA at cardiology appointment.    PT Comments    Pt received reclined in chair and spouse present, pt agreeable to therapy session and with good participation and fair tolerance for mobility. Pt limited due to orthostatic symptoms (see comments below for vitals, MAP (60) in seated position before exercise), RN/MD notified. Pt able to perform seated/pre-gait exercises for BLE strengthening but deferred gait trial this date due to pt symptoms of BLE weakness during pre-gait tasks. Pt significantly fatigued after limited seated/standing tasks. Pt continues to benefit from PT services to progress toward functional mobility goals. Continue to recommend HHPT, pending progress.   Follow Up Recommendations  Home health PT;Supervision for mobility/OOB     Equipment Recommendations  None recommended by PT    Recommendations for Other Services       Precautions / Restrictions Precautions Precautions: Fall Precaution Comments: Monitor O2, BP Restrictions Weight Bearing Restrictions: No    Mobility  Bed Mobility               General bed mobility comments: pt OOB in recliner, pt reports lift bed at home    Transfers Overall transfer level: Needs assistance Equipment used: Rolling walker (2 wheeled) Transfers: Sit to/from Stand Sit to Stand: Min assist;+2 safety/equipment         General transfer comment: pt orthostatic from reclined to seated posture therefore +2 for safety standing from chair, cues for hand placement; no LOB but pt reports BLE "feel weak" after pre-gait march x5 and deferred mobility away from chair  Ambulation/Gait              General Gait Details: deferred, pt BLE "feel weak" with pre-gait marching and unsafe to progress gait distance as she reports 8/10 RPE (fatigue)   Stairs             Wheelchair Mobility    Modified Rankin (Stroke Patients Only)       Balance Overall balance assessment: Needs assistance Sitting-balance support: Feet supported Sitting balance-Leahy Scale: Good Sitting balance - Comments: sitting forward in chair no back support   Standing balance support: Bilateral upper extremity supported Standing balance-Leahy Scale: Poor Standing balance comment: Reliant on B UE support in standing, min guard due to sxs hypotension                            Cognition Arousal/Alertness: Awake/alert Behavior During Therapy: WFL for tasks assessed/performed Overall Cognitive Status: Difficult to assess Area of Impairment: Safety/judgement;Problem solving                         Safety/Judgement: Decreased awareness of deficits   Problem Solving: Requires verbal cues (safety cues) General Comments: pt very pleasant and receptive to instruction but noted decreased insight into deficits; [facility]      Exercises General Exercises - Lower Extremity Ankle Circles/Pumps: AROM;Both;15 reps;Seated Long Arc Quad: AROM;Both;10 reps;Seated Hip Flexion/Marching: AROM;Both;15 reps;Seated;Standing (x5 reps standing x10 reps seated) Other Exercises Other Exercises: BUE AROM: elbow flex/ext with shoulders flexed to 90 deg x10 reps (prior to second BP assessment in chair)    General Comments General comments (  skin integrity, edema, etc.): 99% on 2L O2 [place] during seated/standing tasks, HR 95 seated and up to 118 bpm standing, BP 100/68 (79) reclined, BP 79/52 (60) seated forward in chair feet down, BP 92/63 (73) after LE/UE exercises seated, SBP 94 standing/stable but sxs orthostatic hypotension more prominent standing and deferred gait trial for safety      Pertinent  Vitals/Pain Pain Assessment: No/denies pain    Home Living                      Prior Function            PT Goals (current goals can now be found in the care plan section) Acute Rehab PT Goals Patient Stated Goal: wants to go home PT Goal Formulation: With patient Time For Goal Achievement: 02/07/21 Potential to Achieve Goals: Good Progress towards PT goals: Progressing toward goals (limited session due to orthostasis)    Frequency    Min 3X/week      PT Plan Current plan remains appropriate    Co-evaluation              AM-PAC PT "6 Clicks" Mobility   Outcome Measure  Help needed turning from your back to your side while in a flat bed without using bedrails?: None Help needed moving from lying on your back to sitting on the side of a flat bed without using bedrails?: A Little Help needed moving to and from a bed to a chair (including a wheelchair)?: A Little Help needed standing up from a chair using your arms (e.g., wheelchair or bedside chair)?: A Little Help needed to walk in hospital room?: A Little Help needed climbing 3-5 steps with a railing? : Total 6 Click Score: 17    End of Session Equipment Utilized During Treatment: Gait belt;Oxygen Activity Tolerance: Patient tolerated treatment well Patient left: in chair;with chair alarm set;with family/visitor present;with call bell/phone within reach (in care of OT) Nurse Communication: Mobility status;Other (comment) (symptoms orthostatic hypotension) PT Visit Diagnosis: Unsteadiness on feet (R26.81);Muscle weakness (generalized) (M62.81)     Time: 1610-9604 PT Time Calculation (min) (ACUTE ONLY): 22 min  Charges:  $Therapeutic Exercise: 8-22 mins                     Brandis Wixted P., PTA Acute Rehabilitation Services Pager: [phone] Office: [place] 01/25/2021, 12:59 PM

## 2021-01-25 NOTE — Progress Notes (Signed)
Occupational Therapy Treatment Patient Details Name: [name] MRN: [id number] DOB: 03-13-1946 Today's Date: 01/25/2021    History of present illness [name] is a 75 y.o. female with medical history significant of hypertension, persistent atrial fibrillation, obesity, and diastolic CHF who presented with worsening dyspenea, SpO2 84% on RA at cardiology appointment.   OT comments  Pt received from PTA reporting that pt was orthostatic during mobility tasks, therefore deferred further functional mobility and provided educational session related to energy conservation strategies (ECS) for ADL participation. Issued pt handout to increase carryover of ECS topics. Pt reports most of the ideas pt already does, reinforced need for rest breaks throughout ADLs and planning and prioritizing her day as pt continues to present with decreased activity tolerance with pt verbalizing understanding of suggestions. Pt on 3L during session with sats WFL.  Pt would continue to benefit from skilled occupational therapy while admitted and after d/c to address the below listed limitations in order to improve overall functional mobility and facilitate independence with BADL participation. DC plan remains appropriate, will follow acutely per POC.    Follow Up Recommendations  Home health OT;Supervision/Assistance - 24 hour    Equipment Recommendations  Tub/shower seat    Recommendations for Other Services      Precautions / Restrictions Precautions Precautions: Fall Precaution Comments: Monitor O2 Restrictions Weight Bearing Restrictions: No       Mobility Bed Mobility               General bed mobility comments: pt oOB in recliner, pt reports lift bed at home    Transfers                 General transfer comment: deferred functional mobility as pt just finished working with PTA and was orthostatic with mobility    Balance                                            ADL either performed or assessed with clinical judgement   ADL Overall ADL's : Needs assistance/impaired                                       General ADL Comments: session focus on energy conservations strategies for ADL participation, pt reports alreadying using a lot ECS at homebut very recpetive to education. pt reports rollator, and RW at home and reports her husband assists with IADL tasks     Vision       Perception     Praxis      Cognition Arousal/Alertness: Awake/alert Behavior During Therapy: WFL for tasks assessed/performed Overall Cognitive Status: Within Functional Limits for tasks assessed                                 General Comments: overall WFL for topics discussed,pt very pleasant and receptive to education        Exercises     Shoulder Instructions       General Comments pt on 3L during session wiht VSS, issued pt handout on energy conservation strategies for ADL participation. pt reports most suggestion she already does. pt reports having reacher and rollator at home, also has walkin shower wtih grab bars and  is agreeable to shower seat. pts husband present during session    Pertinent Vitals/ Pain       Pain Assessment: No/denies pain  Home Living                                          Prior Functioning/Environment              Frequency  Min 2X/week        Progress Toward Goals  OT Goals(current goals can now be found in the care plan section)  Progress towards OT goals: Progressing toward goals  Acute Rehab OT Goals Patient Stated Goal: wants to go home OT Goal Formulation: With patient Time For Goal Achievement: 02/06/21 Potential to Achieve Goals: Good  Plan Discharge plan remains appropriate;Frequency remains appropriate    Co-evaluation                 AM-PAC OT "6 Clicks" Daily Activity     Outcome Measure   Help from another person eating meals?:  None Help from another person taking care of personal grooming?: A Little Help from another person toileting, which includes using toliet, bedpan, or urinal?: A Little Help from another person bathing (including washing, rinsing, drying)?: A Little Help from another person to put on and taking off regular upper body clothing?: A Little Help from another person to put on and taking off regular lower body clothing?: A Lot 6 Click Score: 18    End of Session Equipment Utilized During Treatment: Oxygen;Other (comment) (3L [place])  OT Visit Diagnosis: Unsteadiness on feet (R26.81);Other abnormalities of gait and mobility (R26.89);Other (comment);Other symptoms and signs involving cognitive function   Activity Tolerance Patient tolerated treatment well   Patient Left in chair;with call bell/phone within reach   Nurse Communication Mobility status        Time: 4585-9292 OT Time Calculation (min): 8 min  Charges: OT General Charges $OT Visit: 1 Visit OT Treatments $Self Care/Home Management : 8-22 mins Harley Alto., COTA/L Acute Rehabilitation Services [phone]    Precious Haws 01/25/2021, 11:45 AM

## 2021-01-25 NOTE — Progress Notes (Addendum)
PROGRESS NOTE    STEFFIE WAGGONER   CZY:[id number]  DOB: [birth year]  DOA: 01/22/2021 PCP: Shon Baton, MD   Brief Narrative:  ZYRIAH MASK is a 75 year old woman with hypertension, diastolic heart failure, persistent atrial fibrillation and obesity who presents to the hospital with severe shortness of breath with minimal exertion.  She admits to having sore throat and cough.  She was evaluated in the cardiology office on 2/21 and found to have a pulse ox of 84% on room air and sent to the ED.  In the ED, she was felt to have acute exacerbation of her congestive heart failure and a possible left lower lobe pneumonia.  She was admitted for further treatment and cardiology was consulted.   Subjective: Feeling short of breath when moving around however not as bad as when she first came to the hospital.    Assessment & Plan:   Principal Problem:   Acute respiratory failure with hypoxia  -Being treated for left lower lobe pneumonia and acute congestive heart failure - also has a left pleural effusion- see below  Active Problems: Left lower lobe pneumonia -She is receiving ceftriaxone and azithromycin with a plan for 5 days of treatment -Her cough has improved    Acute exacerbation of diastolic heart failure H/o HTN - cardiology managing - The patient is being diuresed with IV furosemide but today has become orthostatic- I have stopped the Furosemide and notified Dr Virgina Jock, cardiology - Atenolol, Amlodipine and Losartan are on hold  Left pleural effusion - appears mod-large on CXR - ? If para pneumonic or due to CHF- will order IR thoracentesis as we are not able to diurese her further and she remains hypoxic and short of breath  Moderate to large pericardial effusion --2D echo revealed a moderate to severe pericardial effusion - 2/23> -she has undergone a pericardiocentesis  - 500 cc of hemorrhagic fluid was obtained - cardiology is repeating an ECHO today  Orthostatic  Hypotension - orthostatic vitals positive today- will hold Lasix and d/w cardiology    Essential hypertension -Home medications include amlodipine, atenolol, losartan -Patient is currently receiving metoprolol    Longstanding persistent atrial fibrillation  -Continue metoprolol -Eliquis has been held for pericardiocentesis- defer management to cardiology  Hypothyroidism -Continue Synthroid    Anemia microcytic -Iron saturation is low at 7 -Started oral Iron BID    AKI (acute kidney injury)-CKD stage III -Creatinine noted to be 1.98 when the patient was admitted on 2/21 -Her baseline creatinine appears to be in between 1.3-1.5 -Continue to follow creatinine level     Prolonged QT interval -Avoid QT prolonging medications   Time spent in minutes: 35 DVT prophylaxis: Place and maintain sequential compression device Start: 01/24/21 1235 SCDs Code Status: Full code Family Communication: Family at bedside Level of Care: Level of care: Progressive Disposition Plan:  Status is: Inpatient  Remains inpatient appropriate because:IV treatments appropriate due to intensity of illness or inability to take PO   Dispo: The patient is from: Home              Anticipated d/c is to: Home              Anticipated d/c date is: 2 days              Patient currently is not medically stable to d/c.   Difficult to place patient No      Consultants:   Cardiology Procedures:   2 D ECHO Antimicrobials:  Anti-infectives (  From admission, onward)   Start     Dose/Rate Route Frequency Ordered Stop   01/23/21 1830  cefTRIAXone (ROCEPHIN) 1 g in sodium chloride 0.9 % 100 mL IVPB        1 g 200 mL/hr over 30 Minutes Intravenous  Once 01/22/21 2347 01/23/21 1800   01/23/21 1800  azithromycin (ZITHROMAX) 500 mg in sodium chloride 0.9 % 250 mL IVPB        500 mg 250 mL/hr over 60 Minutes Intravenous  Once 01/22/21 2347 01/23/21 1903   01/22/21 1745  cefTRIAXone (ROCEPHIN) 1 g in sodium  chloride 0.9 % 100 mL IVPB        1 g 200 mL/hr over 30 Minutes Intravenous  Once 01/22/21 1738 01/22/21 1923   01/22/21 1745  azithromycin (ZITHROMAX) 500 mg in sodium chloride 0.9 % 250 mL IVPB        500 mg 250 mL/hr over 60 Minutes Intravenous  Once 01/22/21 1738 01/22/21 2143       Objective: Vitals:   01/24/21 2300 01/25/21 0400 01/25/21 0646 01/25/21 0755  BP: 95/72 97/63  93/62  Pulse: 89 95 79 92  Resp: 16 14 16 20   Temp: 97.6 F (36.4 C) 97.8 F (36.6 C)  98.1 F (36.7 C)  TempSrc: Oral Oral  Oral  SpO2: 98% 96% 95% 97%  Weight:   119.3 kg   Height:        Intake/Output Summary (Last 24 hours) at 01/25/2021 1041 Last data filed at 01/25/2021 1034 Gross per 24 hour  Intake 240 ml  Output 400 ml  Net -160 ml   Filed Weights   01/23/21 0500 01/24/21 0534 01/25/21 0646  Weight: 118.9 kg 118.2 kg 119.3 kg    Examination: General exam: Appears comfortable  HEENT: PERRLA, oral mucosa moist, no sclera icterus or thrush Respiratory system: crackles at lung bases- poor air movement in left lung field Respiratory effort normal. Cardiovascular system: S1 & S2 heard, regular rate and rhythm Gastrointestinal system: Abdomen soft, non-tender, nondistended. Normal bowel sounds   Central nervous system: Alert and oriented. No focal neurological deficits. Extremities: No cyanosis, clubbing - 2 + pitting edema in leg Skin: No rashes or ulcers Psychiatry:  Mood & affect appropriate.    Data Reviewed: I have personally reviewed following labs and imaging studies  CBC: Recent Labs  Lab 01/22/21 1445 01/23/21 0110 01/24/21 0121 01/25/21 0757  WBC 13.0* 12.4* 13.7* 9.7  NEUTROABS  --  10.3*  --   --   HGB 10.9* 11.1* 10.4* 10.4*  HCT 38.8 36.9 35.4* 37.0  MCV 73.6* 72.6* 72.7* 74.6*  PLT 465* 438* 417* 093*   Basic Metabolic Panel: Recent Labs  Lab 01/22/21 1445 01/23/21 0110 01/23/21 0442 01/24/21 0121 01/25/21 0757  NA 136 134*  --  133* 136  K 4.3 6.0*  4.2 4.8 4.2  CL 95* 98  --  94* 96*  CO2 31 20*  --  25 30  GLUCOSE 110* 101*  --  98 83  BUN 49* 50*  --  49* 46*  CREATININE 1.98* 1.87*  --  1.87* 1.84*  CALCIUM 8.9 8.4*  --  7.9* 8.5*  MG  --  2.1  --   --   --   PHOS  --  5.0*  --   --   --    GFR: Estimated Creatinine Clearance: 33.6 mL/min (A) (by C-G formula based on SCr of 1.84 mg/dL (H)). Liver Function Tests: Recent Labs  Lab 01/23/21 0110  AST 65*  ALT 19  ALKPHOS 52  BILITOT 1.0  PROT 6.2*  ALBUMIN 3.1*   No results for input(s): LIPASE, AMYLASE in the last 168 hours. No results for input(s): AMMONIA in the last 168 hours. Coagulation Profile: No results for input(s): INR, PROTIME in the last 168 hours. Cardiac Enzymes: No results for input(s): CKTOTAL, CKMB, CKMBINDEX, TROPONINI in the last 168 hours. BNP (last 3 results) No results for input(s): PROBNP in the last 8760 hours. HbA1C: No results for input(s): HGBA1C in the last 72 hours. CBG: No results for input(s): GLUCAP in the last 168 hours. Lipid Profile: No results for input(s): CHOL, HDL, LDLCALC, TRIG, CHOLHDL, LDLDIRECT in the last 72 hours. Thyroid Function Tests: Recent Labs    01/23/21 0110  TSH 1.499   Anemia Panel: Recent Labs    01/22/21 2039  VITAMINB12 1,020*  FOLATE 11.2  FERRITIN 70  TIBC 466*  IRON 32  RETICCTPCT 2.0   Urine analysis:    Component Value Date/Time   COLORURINE STRAW (A) 05/20/2017 1105   APPEARANCEUR CLEAR 05/20/2017 1105   LABSPEC 1.005 05/20/2017 1105   PHURINE 7.0 05/20/2017 1105   GLUCOSEU NEGATIVE 05/20/2017 1105   HGBUR SMALL (A) 05/20/2017 1105   BILIRUBINUR NEGATIVE 05/20/2017 1105   KETONESUR NEGATIVE 05/20/2017 1105   PROTEINUR NEGATIVE 05/20/2017 1105   UROBILINOGEN 0.2 01/31/2011 1208   NITRITE NEGATIVE 05/20/2017 1105   LEUKOCYTESUR MODERATE (A) 05/20/2017 1105   Sepsis Labs: @LABRCNTIP (procalcitonin:4,lacticidven:4) ) Recent Results (from the past 240 hour(s))  SARS CORONAVIRUS 2  (TAT 6-24 HRS) Nasopharyngeal Nasopharyngeal Swab     Status: None   Collection Time: 01/22/21  4:12 PM   Specimen: Nasopharyngeal Swab  Result Value Ref Range Status   SARS Coronavirus 2 NEGATIVE NEGATIVE Final    Comment: (NOTE) SARS-CoV-2 target nucleic acids are NOT DETECTED.  The SARS-CoV-2 RNA is generally detectable in upper and lower respiratory specimens during the acute phase of infection. Negative results do not preclude SARS-CoV-2 infection, do not rule out co-infections with other pathogens, and should not be used as the sole basis for treatment or other patient management decisions. Negative results must be combined with clinical observations, patient history, and epidemiological information. The expected result is Negative.  Fact Sheet for Patients: SugarRoll.be  Fact Sheet for Healthcare Providers: https://www.woods-mathews.com/  This test is not yet approved or cleared by the [place] FDA and  has been authorized for detection and/or diagnosis of SARS-CoV-2 by FDA under an Emergency Use Authorization (EUA). This EUA will remain  in effect (meaning this test can be used) for the duration of the COVID-19 declaration under Se ction 564(b)(1) of the Act, 21 U.S.C. section 360bbb-3(b)(1), unless the authorization is terminated or revoked sooner.  Performed at [facility], [place] [address], [place], [place] [address]   Body fluid culture w Gram Stain     Status: None (Preliminary result)   Collection Time: 01/24/21  9:28 AM   Specimen: PATH Cytology Misc. fluid; Body Fluid  Result Value Ref Range Status   Specimen Description FLUID  Final   Special Requests NONE  Final   Gram Stain   Final    FEW WBC PRESENT, PREDOMINANTLY PMN NO ORGANISMS SEEN    Culture   Final    NO GROWTH < 24 HOURS Performed at [facility] [address] [place], [place] [address]    Report Status PENDING  Incomplete  Radiology Studies: DG Chest 1 View  Result Date: 01/25/2021 CLINICAL DATA:  Shortness of breath and cough EXAM: CHEST  1 VIEW COMPARISON:  January 22, 2021 FINDINGS: There is persistent left pleural effusion with consolidation in the left lower lung region. There is an equivocal pleural effusion on the right. There is cardiomegaly with pulmonary vascularity normal. No adenopathy. There is aortic atherosclerosis. No bone lesions. IMPRESSION: Stable cardiomegaly. Persistent airspace consolidation left lower lung region and left pleural effusion, similar to prior study. Equivocal right pleural effusion. Right lung otherwise clear. Aortic Atherosclerosis (ICD10-I70.0). Electronically Signed   By: Lowella Grip III M.D.   On: 01/25/2021 09:04   CARDIAC CATHETERIZATION  Result Date: 01/24/2021 Pericardiocentesis 01/24/2021: Successful ultrasound and radiographic and EKG assisted pericardiocentesis with successful aspiration of hemorrhagic 500 mL of fluid. Fluid sent for Gram stain and cytology. Adrian Prows, MD, [facility] 01/24/2021, 10:31 AM Office: [phone] Pager: [phone]   ECHOCARDIOGRAM COMPLETE  Result Date: 01/23/2021    ECHOCARDIOGRAM REPORT   Patient Name:   LENNIS RADER Date of Exam: 01/23/2021 Medical Rec #:  [id number]      Height:       64.0 in Accession #:    [id number]     Weight:       262.1 lb Date of Birth:  01-10-1946       BSA:          2.195 m Patient Age:    56 years       BP:           111/64 mmHg Patient Gender: F              HR:           73 bpm. Exam Location:  Inpatient Procedure: 2D Echo Indications:     acute diastolic chf  History:         Patient has no prior history of Echocardiogram examinations.                  Chronic kidney disease, Arrythmias:Atrial Fibrillation,                  Signs/Symptoms:leg edema; Risk Factors:Hypertension.  Sonographer:     Johny Chess RDCS Referring Phys:  Geryl Rankins DOUTOVA Diagnosing Phys: Vernell Leep MD  IMPRESSIONS  1. Left ventricular ejection fraction, by estimation, is 50 to 55%. The left ventricle has low normal function. The left ventricle has no regional wall motion abnormalities. Left ventricular diastolic parameters are indeterminate. There is Respiratory bounce concerning for tamponade physiology.  2. Right ventricular systolic function is normal. The right ventricular size is normal. There is normal pulmonary artery systolic pressure.  3. Left atrial size was mildly dilated.  4. Right atrial size was mildly dilated.  5. The mitral valve is grossly normal. Mild mitral valve regurgitation.  6. Aortic valve is probably trileaflet with moderate calcification. Inadequate Doppler evaluation to evaluate aortic stenosis. . The aortic valve is calcified. Aortic valve regurgitation is not visualized.  7. The inferior vena cava is dilated in size with <50% respiratory variability, suggesting right atrial pressure of 15 mmHg.  8. Moderate to large circumferential pericardial effusion. Largest dimension measures 2.5 cm anterior to RV in subcostal window. Mitral and tricuspid inflow respiratory variation, dilated IVC, and interventricular septal boucne concerning for tamponade physiology. Clinical correlation recommended. . Moderate pericardial effusion.  9. Compared to previous outpatient study in 2018, pericardial effusion is new. FINDINGS  Left Ventricle: Left ventricular ejection  fraction, by estimation, is 50 to 55%. The left ventricle has low normal function. The left ventricle has no regional wall motion abnormalities. The left ventricular internal cavity size was normal in size. There is no left ventricular hypertrophy. Respiratory bounce concerning for tamponade physiology. Left ventricular diastolic parameters are indeterminate. Right Ventricle: The right ventricular size is normal. No increase in right ventricular wall thickness. Right ventricular systolic function is normal. There is normal pulmonary artery  systolic pressure. The tricuspid regurgitant velocity is 2.47 m/s, and  with an assumed right atrial pressure of 10 mmHg, the estimated right ventricular systolic pressure is 73.7 mmHg. Left Atrium: Left atrial size was mildly dilated. Right Atrium: Right atrial size was mildly dilated. Pericardium: Moderate to large circumferential pericardial effusion. Largest dimension measures 2.5 cm anterior to RV in subcostal window. Mitral and tricuspid inflow respiratory variation, dilated IVC, and interventricular septal boucne concerning for tamponade physiology. Clinical correlation recommended. A moderately sized pericardial effusion is present. There is excessive respiratory variation in septal movement. Mitral Valve: The mitral valve is grossly normal. Mild mitral valve regurgitation. Tricuspid Valve: The tricuspid valve is grossly normal. Tricuspid valve regurgitation is mild. Aortic Valve: Aortic valve is probably trileaflet with moderate calcification. Inadequate Doppler evaluation to evaluate aortic stenosis. The aortic valve is calcified. Aortic valve regurgitation is not visualized. Pulmonic Valve: The pulmonic valve was grossly normal. Pulmonic valve regurgitation is not visualized. Aorta: The aortic root and ascending aorta are structurally normal, with no evidence of dilitation. Venous: The inferior vena cava is dilated in size with less than 50% respiratory variability, suggesting right atrial pressure of 15 mmHg. IAS/Shunts: No atrial level shunt detected by color flow Doppler. Additional Comments: There is a small pleural effusion in the left lateral region.  LEFT VENTRICLE PLAX 2D LVIDd:         4.40 cm LVIDs:         3.00 cm LV PW:         0.90 cm LV IVS:        0.90 cm LVOT diam:     1.80 cm LVOT Area:     2.54 cm  IVC IVC diam: 2.30 cm LEFT ATRIUM             Index       RIGHT ATRIUM           Index LA diam:        4.00 cm 1.82 cm/m  RA Area:     18.00 cm LA Vol (A2C):   56.4 ml 25.70 ml/m RA  Volume:   45.60 ml  20.78 ml/m LA Vol (A4C):   77.2 ml 35.18 ml/m LA Biplane Vol: 67.7 ml 30.85 ml/m   AORTA Ao Root diam: 2.70 cm Ao Asc diam:  3.10 cm TRICUSPID VALVE TR Peak grad:   24.4 mmHg TR Vmax:        247.00 cm/s  SHUNTS Systemic Diam: 1.80 cm Manish Patwardhan MD Electronically signed by Vernell Leep MD Signature Date/Time: 01/23/2021/8:04:28 PM    Final       Scheduled Meds: . ferrous sulfate  325 mg Oral BID WC  . furosemide  40 mg Intravenous BID  . guaiFENesin  600 mg Oral BID  . levothyroxine  125 mcg Oral Q0600  . metoprolol tartrate  12.5 mg Oral BID  . pantoprazole  40 mg Oral Daily  . pravastatin  80 mg Oral q AM  . senna  1 tablet Oral BID  . sodium  chloride flush  3 mL Intravenous Q12H   Continuous Infusions: . sodium chloride       LOS: 3 days      Debbe Odea, MD [facility] Pager: [email or web address] 01/25/2021, 10:41 AM

## 2021-01-26 ENCOUNTER — Inpatient Hospital Stay (HOSPITAL_COMMUNITY): Payer: Medicare HMO

## 2021-01-26 HISTORY — PX: IR THORACENTESIS ASP PLEURAL SPACE W/IMG GUIDE: IMG5380

## 2021-01-26 LAB — ECHOCARDIOGRAM LIMITED
Calc EF: 54.4 %
Height: 64 in
Height: 64 in
S' Lateral: 2.4 cm
Single Plane A2C EF: 55.7 %
Single Plane A4C EF: 51.7 %
Weight: 4169.34 oz
Weight: 4208.14 oz

## 2021-01-26 LAB — BASIC METABOLIC PANEL
Anion gap: 10 (ref 5–15)
BUN: 46 mg/dL — ABNORMAL HIGH (ref 8–23)
CO2: 32 mmol/L (ref 22–32)
Calcium: 8.5 mg/dL — ABNORMAL LOW (ref 8.9–10.3)
Chloride: 95 mmol/L — ABNORMAL LOW (ref 98–111)
Creatinine, Ser: 1.85 mg/dL — ABNORMAL HIGH (ref 0.44–1.00)
GFR, Estimated: 28 mL/min — ABNORMAL LOW (ref >60)
Glucose, Bld: 94 mg/dL (ref 70–99)
Potassium: 4.9 mmol/L (ref 3.5–5.1)
Sodium: 137 mmol/L (ref 135–145)

## 2021-01-26 LAB — PROTEIN, PLEURAL OR PERITONEAL FLUID: Total protein, fluid: <3 g/dL

## 2021-01-26 LAB — BODY FLUID CELL COUNT WITH DIFFERENTIAL
Lymphs, Fluid: 51 %
Monocyte-Macrophage-Serous Fluid: 16 % — ABNORMAL LOW (ref 50–90)
Neutrophil Count, Fluid: 33 % — ABNORMAL HIGH (ref 0–25)
Total Nucleated Cell Count, Fluid: 1727 cu mm — ABNORMAL HIGH (ref 0–1000)

## 2021-01-26 LAB — PH, BODY FLUID: pH, Body Fluid: 7.5

## 2021-01-26 LAB — GRAM STAIN

## 2021-01-26 LAB — AMYLASE, PLEURAL OR PERITONEAL FLUID: Amylase, Fluid: 30 U/L

## 2021-01-26 LAB — LACTATE DEHYDROGENASE, PLEURAL OR PERITONEAL FLUID: LD, Fluid: 79 U/L — ABNORMAL HIGH (ref 3–23)

## 2021-01-26 LAB — LEGIONELLA PNEUMOPHILA SEROGP 1 UR AG: L. pneumophila Serogp 1 Ur Ag: NEGATIVE

## 2021-01-26 LAB — GLUCOSE, PLEURAL OR PERITONEAL FLUID: Glucose, Fluid: 100 mg/dL

## 2021-01-26 MED ORDER — DILTIAZEM HCL 60 MG PO TABS
30.0000 mg | ORAL_TABLET | Freq: Three times a day (TID) | ORAL | Status: DC
  Administered 2021-01-26 – 2021-01-29 (×9): 30 mg via ORAL
  Filled 2021-01-26 (×9): qty 1

## 2021-01-26 MED ORDER — LIDOCAINE HCL 1 % IJ SOLN
INTRAMUSCULAR | Status: AC
  Filled 2021-01-26: qty 20

## 2021-01-26 MED ORDER — DIGOXIN 125 MCG PO TABS
0.1250 mg | ORAL_TABLET | Freq: Every day | ORAL | Status: DC
  Administered 2021-01-27 – 2021-01-30 (×4): 0.125 mg via ORAL
  Filled 2021-01-26 (×4): qty 1

## 2021-01-26 MED ORDER — AMIODARONE HCL 200 MG PO TABS
200.0000 mg | ORAL_TABLET | Freq: Every day | ORAL | Status: DC
  Administered 2021-01-26 – 2021-01-28 (×3): 200 mg via ORAL
  Filled 2021-01-26 (×4): qty 1

## 2021-01-26 MED ORDER — FUROSEMIDE 10 MG/ML IJ SOLN
40.0000 mg | Freq: Two times a day (BID) | INTRAMUSCULAR | Status: DC
  Administered 2021-01-26 – 2021-01-28 (×6): 40 mg via INTRAVENOUS
  Filled 2021-01-26 (×6): qty 4

## 2021-01-26 MED ORDER — LIDOCAINE HCL (PF) 1 % IJ SOLN
INTRAMUSCULAR | Status: AC | PRN
  Administered 2021-01-26: 10 mL

## 2021-01-26 MED ORDER — DIGOXIN 0.25 MG/ML IJ SOLN
0.2500 mg | INTRAMUSCULAR | Status: AC
  Administered 2021-01-26 (×3): 0.25 mg via INTRAVENOUS
  Filled 2021-01-26 (×5): qty 1

## 2021-01-26 NOTE — Progress Notes (Signed)
PT Cancellation Note  Patient Details Name: CLARESSA HUGHLEY MRN: [id number] DOB: 1946-08-30   Cancelled Treatment:    Reason Eval/Treat Not Completed: (P) Patient at procedure or test/unavailable (pt off floor for thoracentesis during AM attempt) Will continue efforts in afternoon per PT POC as schedule permits.   Kara Pacer Trisha Morandi 01/26/2021, 11:48 AM

## 2021-01-26 NOTE — Procedures (Addendum)
PROCEDURE SUMMARY:  Successful US guided left diagnostic and therapeutic left thoracentesis. Yielded 850 mL of dark yellow, clear fluid. Pt tolerated procedure well. No immediate complications.  Specimen was sent for labs. CXR ordered.  EBL < 5 mL  BP post-procedure was 79/61.  Patient mentating well. Denies dizziness, lightheadedness. Returned to unit. RN notified.  Docia Barrier PA-C 01/26/2021 11:08 AM

## 2021-01-26 NOTE — Care Management Important Message (Signed)
Important Message  Patient Details  Name: [name] MRN: [id number] Date of Birth: 23-Mar-1946   Medicare Important Message Given:  Yes     Shelda Altes 01/26/2021, 12:39 PM

## 2021-01-26 NOTE — Progress Notes (Addendum)
Subjective:  Per nursing, still extremely short of breath, minimal activities brings on dyspnea.  Leg edema persist.  Continues to remain tachycardic.  Intake/Output from previous day:  I/O last 3 completed shifts: In: 240 [P.O.:240] Out: 700 [Urine:700] No intake/output data recorded.  Blood pressure 103/70, pulse 98, temperature 98 F (36.7 C), temperature source Oral, resp. rate 18, height 5' 4"  (1.626 m), weight 119.3 kg, SpO2 98 %. Body mass index is 45.15 kg/m.  Physical Exam Constitutional:      Appearance: She is morbidly obese. She is ill-appearing.     Interventions: Nasal cannula in place.  HENT:     Head: Atraumatic.  Cardiovascular:     Rate and Rhythm: Tachycardia present. Rhythm irregularly irregular.     Pulses:          Carotid pulses are 2+ on the right side and 2+ on the left side.      Femoral pulses are 2+ on the right side and 2+ on the left side.      Dorsalis pedis pulses are 0 on the right side and 0 on the left side.       Posterior tibial pulses are 0 on the right side and 0 on the left side.     Heart sounds: No murmur heard. No gallop. No S3 or S4 sounds.      Comments: S1 is variable, S2 is normal. JVD elevated up to the angle of jaw.  2+ bilateral pitting edema.  Chronic dermatitis changes and ischemic changes in the lower extremity noted with thin skin.  No skin breakdown. Pulmonary:     Effort: Pulmonary effort is normal.     Breath sounds: Examination of the right-middle field reveals rales. Examination of the left-middle field reveals rales. Examination of the right-lower field reveals rales. Examination of the left-lower field reveals decreased breath sounds and rales. Decreased breath sounds and rales present.  Abdominal:     General: Bowel sounds are normal.     Palpations: Abdomen is soft.    Lab Results: BNP (last 3 results) Recent Labs    12/25/20 1311 01/22/21 1605  BNP 265.7* 310.6*    BMP Latest Ref Rng & Units 01/26/2021  01/25/2021 01/24/2021  Glucose 70 - 99 mg/dL 94 83 98  BUN 8 - 23 mg/dL 46(H) 46(H) 49(H)  Creatinine 0.44 - 1.00 mg/dL 1.85(H) 1.84(H) 1.87(H)  Sodium 135 - 145 mmol/L 137 136 133(L)  Potassium 3.5 - 5.1 mmol/L 4.9 4.2 4.8  Chloride 98 - 111 mmol/L 95(L) 96(L) 94(L)  CO2 22 - 32 mmol/L 32 30 25  Calcium 8.9 - 10.3 mg/dL 8.5(L) 8.5(L) 7.9(L)   Hepatic Function Latest Ref Rng & Units 01/23/2021 05/20/2017 04/16/2017  Total Protein 6.5 - 8.1 g/dL 6.2(L) 7.1 7.1  Albumin 3.5 - 5.0 g/dL 3.1(L) 4.1 3.8  AST 15 - 41 U/L 65(H) 20 23  ALT 0 - 44 U/L 19 13(L) 14  Alk Phosphatase 38 - 126 U/L 52 56 60  Total Bilirubin 0.3 - 1.2 mg/dL 1.0 0.7 0.5   CBC Latest Ref Rng & Units 01/25/2021 01/24/2021 01/23/2021  WBC 4.0 - 10.5 K/uL 9.7 13.7(H) 12.4(H)  Hemoglobin 12.0 - 15.0 g/dL 10.4(L) 10.4(L) 11.1(L)  Hematocrit 36.0 - 46.0 % 37.0 35.4(L) 36.9  Platelets 150 - 400 K/uL 402(H) 417(H) 438(H)   Lipid Panel  No results found for: CHOL, TRIG, HDL, CHOLHDL, VLDL, LDLCALC, LDLDIRECT Cardiac Panel (last 3 results) No results for input(s): CKTOTAL, CKMB, TROPONINI, RELINDX in  the last 72 hours.  HEMOGLOBIN A1C No results found for: HGBA1C, MPG TSH Recent Labs    01/23/21 0110  TSH 1.499   Imaging: Chest Xray 01/25/2021: Stable cardiomegaly. Persistent airspace consolidation left lower lung region and left pleural effusion, similar to prior study. Equivocal right pleural effusion. Right lung otherwise clear. Aortic Atherosclerosis (ICD10-I70.0).  Cardiac Studies:  EKG 01/22/2021: Afib with controlled ventricular rate  Pericardiocentesis 01/24/2021: Successful ultrasound and radiographic and EKG assisted pericardiocentesis with successful aspiration of hemorrhagic 500 mL of fluid. Fluid sent for Gram stain and cytology.   Echocardiogram 01/23/2021: 1. Left ventricular ejection fraction, by estimation, is 50 to 55%. The  left ventricle has low normal function. The left ventricle has no  regional  wall motion abnormalities. Left ventricular diastolic parameters are  indeterminate. There is Respiratory  bounce concerning for tamponade physiology.  2. Right ventricular systolic function is normal. The right ventricular  size is normal. There is normal pulmonary artery systolic pressure.  3. Left atrial size was mildly dilated.  4. Right atrial size was mildly dilated.  5. The mitral valve is grossly normal. Mild mitral valve regurgitation.  6. Aortic valve is probably trileaflet with moderate calcification.  Inadequate Doppler evaluation to evaluate aortic stenosis. . The aortic  valve is calcified. Aortic valve regurgitation is not visualized.  7. The inferior vena cava is dilated in size with <50% respiratory  variability, suggesting right atrial pressure of 15 mmHg.  8. Moderate to large circumferential pericardial effusion. Largest  dimension measures 2.5 cm anterior to RV in subcostal window. Mitral and  tricuspid inflow respiratory variation, dilated IVC, and interventricular  septal boucne concerning for tamponade  physiology. Clinical correlation recommended. . Moderate pericardial  effusion.  9. Compared to previous outpatient study in 2018, pericardial effusion is  new.   Echocardiogram 01/25/2021:   1. Left ventricular ejection fraction, by estimation, is 50 to 55%. Left ventricular ejection fraction by 2D MOD biplane is 54.4 %. The left ventricle has low normal function. The left ventricle has no regional wall motion abnormalities. Left  ventricular diastolic function could not be evaluated.  2. Right ventricular systolic function is normal. The right ventricular size is normal. There is normal pulmonary artery systolic pressure.  3. Left atrial size was moderately dilated.  4. Right atrial size was moderately dilated.  5. There is organized thrombus on the free wall of the right ventricle and the RA. Doppler's not performed. Limited echo.. The pericardial  effusion is anterior to the right ventricle, localized near the right atrium and surrounding the apex.  6. The mitral valve is normal in structure. Mild mitral valve regurgitation. No evidence of mitral stenosis.  7. The aortic valve is normal in structure. Aortic valve regurgitation is not visualized. No aortic stenosis is present.  Comparison(s): Compared to 01/23/2021, moderate exudative pericardial effusion with suggestion of tamponade not present.  Scheduled Meds: . ferrous sulfate  325 mg Oral BID WC  . guaiFENesin  600 mg Oral BID  . levothyroxine  125 mcg Oral Q0600  . metoprolol tartrate  12.5 mg Oral BID  . pantoprazole  40 mg Oral Daily  . pravastatin  80 mg Oral q AM  . senna  1 tablet Oral BID  . sodium chloride flush  3 mL Intravenous Q12H   Continuous Infusions: . sodium chloride     PRN Meds:.sodium chloride, acetaminophen **OR** acetaminophen, albuterol, bisacodyl, HYDROcodone-acetaminophen, polyethylene glycol, sodium chloride flush  Assessment/Plan:  1.  Hemorrhagic pericardial effusion SP  pericardiocentesis on 01/24/2021 with 500 mL aspirate.  No malignant cells evident. 2.  Acute on chronic diastolic heart failure 3.  Permanent atrial fibrillation CHA2DS2-VASc Score is 5.  Yearly risk of stroke: 7.2% (A, F, HTN CHF).  Score of 1=0.6; 2=2.2; 3=3.2; 4=4.8; 5=7.2; 6=9.8; 7=>9.8) -(CHF; HTN; vasc disease DM,  Female = 1; Age <65 =0; 65-74 = 1,  >75 =2; stroke/embolism= 2).   4.  Shortness of breath and dyspnea exertion of multifactorial etiology including the above and also morbid obesity with obesity hypoventilation syndrome. 5.  Chronic kidney disease stage IIIb 6.  Bilateral pleural effusion, left greater than the right, scheduled for thoracentesis.   Recommendation: Patient is in persistent heart failure.  She is not ready for ambulation yet, she needs more aggressive diuresis, she also needs rate control.  I will start her on p.o. digoxin, restart furosemide  for now for the next 24 to 48 hours until her right-sided heart failure resolves.  Blood pressure is very soft and we have been unable to add any ACE inhibitor's or ARB.  Could consider adding low-dose of spironolactone and carefully monitoring renal function especially in view of stage IIIb chronic kidney disease.  For now restart IV Lasix, follow-up on BMP and BNP in the morning.  Elevated JVD is probably related to probably patient having acute restrictive physiology with regard to persistent thrombus noted on the free wall of the right ventricle which I suspect will resolve.  I would like to start her on amiodarone however there is documentation of prolonged QT, the latest EKG also reveals prolonged QT at 505 ms, corrected QT.  I still would like to and prefer to use amiodarone if possible, I will repeat EKG to follow-up on QT interval.  As long as heart rate is not controlled, I have a feeling that she will be in persistent heart failure.  We could also try a combination of low-dose diltiazem 30 mg 3 times daily along with continued metoprolol tartrate 12.5 mg twice daily.  With regard to atrial fibrillation, she has very high chads vascular score and needs to restart Eliquis.  As there has been no recurrence of pericardial effusion, as long as pleural fluid is not hemorrhagic, would recommend restarting Eliquis.  I will leave it to the primary team regarding this.  We will continue to follow.  Medications ordered this encounter = digitalization with IV dig x3 doses followed by oral dig starting tomorrow, furosemide 40 mg twice daily, diltiazem 30 mg 3 times daily. BMP daily for the next 3 days, BMP daily for the next 3 days.   Adrian Prows, MD, [facility] 01/26/2021, 7:59 AM Office: [phone] Pager: [phone]   Addendum: Patient had thoracentesis, approximately 850 mL of straw-colored fluid was withdrawn.  No hemorrhagic effusion.  Hence we will proceed with starting patient on Eliquis. EKG  reviewed, QT is normal.  I will also start the patient on amiodarone and recheck EKG in the morning.

## 2021-01-26 NOTE — Progress Notes (Signed)
[facility] PROGRESS NOTE    Progress Note  [name]  MWU:[id number] DOB: 1946/02/16 DOA: 01/22/2021 PCP: Shon Baton, MD     Brief Narrative:   [name] is an 75 y.o. female past medical history of essential hypertension, chronic diastolic heart failure, persistent atrial fibrillation and obesity comes into the hospital with severe shortness of breath was seen by her cardiologist on 01/22/2019 and found to be hypoxic 83 sent to the ED and found to be in acute congestive heart failure possible left lower lobe pneumonia   Significant studies: 01/22/2021 chest x-ray showed cardiomegaly small right pleural effusion and moderate left with possible pneumonia 01/25/2021 chest x-ray showed persistent consolidation of the left lower lobe  Antibiotics: None  Microbiology data: Blood culture: Negative till date  Procedures: 01/24/2021 pericardiocentesis and yielded 500 cc of bloody fluid.   01/24/2019 cytology of pericardial fluid yielded no malignant cells  Assessment/Plan:   Acute respiratory failure with hypoxia due to left lower lobe pneumonia possibly acute diastolic heart failure: She has completed her antibiotic regimen. Cardiology was consulted and started IV diuresis and she became orthostatic. Atenolol and losartan were held.  And recommended to continue IV diuresis. Cardiology recommended repeat a limited 2D echo that showed preserved EF with a thrombus in the right ventricle and right atrium and anterior right ventricular effusion surrounding the atrium and apex. She is negative only about 400 cc, continues to appear fluid overloaded. Agree with cardiology to continue IV Lasix.  Moderate to large pericardial effusion: 2D echo done on admission revealed a large pericardial effusion. She is status post pericardiocentesis on 01/24/2021 which yielded 500 cc of hemorrhagic fluid. Repeated 2D echo showed an anterior right ventricular effusion surrounding the atrial and  apex. Further management per cardiology.  Unsure if this effusion is contributing to her dyspnea.  Left pleural effusion: Seen on chest x-ray, as this is definitely asymptomatic. IR was consulted for thoracocentesis.  She remains hypoxic and short of breath.  Orthostatic hypotension Improved hypotension, cardiology recommended to stop antihypertensive medication and continue IV diuresis.  Essential hypertension Holding amlodipine atenolol losartan. Is currently on metoprolol.  Persistent atrial fibrillation: Continue metoprolol Eliquis has been held for pericardiocentesis and thoracocentesis.  Hypothyroidism: Continue Synthroid.  Microcytic anemia: Due to iron deficiency anemia continue oral iron.  Acute on chronic kidney disease stage IIIa: With a baseline of around 1.3-1.5, now 1.8 we will continue to monitor closely. Continue IV diuresis.  Prolonged QTC: Noted.   DVT prophylaxis: eliquis Family Communication:daughter Status is: Inpatient  Remains inpatient appropriate because:Hemodynamically unstable   Dispo: The patient is from: Home              Anticipated d/c is to: Home              Patient currently is not medically stable to d/c.   Difficult to place patient No        Code Status:     Code Status Orders  (From admission, onward)         Start     Ordered   01/22/21 2348  Full code  Continuous        01/22/21 2347        Code Status History    Date Active Date Inactive Code Status Order ID Comments User Context   05/29/2017 1726 05/31/2017 1806 Full Code [id number]  Leandrew Koyanagi, MD Inpatient   Advance Care Planning Activity        IV  Access:    Peripheral IV   Procedures and diagnostic studies:   DG Chest 1 View  Result Date: 01/25/2021 CLINICAL DATA:  Shortness of breath and cough EXAM: CHEST  1 VIEW COMPARISON:  January 22, 2021 FINDINGS: There is persistent left pleural effusion with consolidation in the left lower lung  region. There is an equivocal pleural effusion on the right. There is cardiomegaly with pulmonary vascularity normal. No adenopathy. There is aortic atherosclerosis. No bone lesions. IMPRESSION: Stable cardiomegaly. Persistent airspace consolidation left lower lung region and left pleural effusion, similar to prior study. Equivocal right pleural effusion. Right lung otherwise clear. Aortic Atherosclerosis (ICD10-I70.0). Electronically Signed   By: Lowella Grip III M.D.   On: 01/25/2021 09:04   CARDIAC CATHETERIZATION  Result Date: 01/24/2021 Pericardiocentesis 01/24/2021: Successful ultrasound and radiographic and EKG assisted pericardiocentesis with successful aspiration of hemorrhagic 500 mL of fluid. Fluid sent for Gram stain and cytology. Adrian Prows, MD, [facility] 01/24/2021, 10:31 AM Office: [phone] Pager: [phone]   ECHOCARDIOGRAM LIMITED  Result Date: 01/26/2021    ECHOCARDIOGRAM LIMITED REPORT   Patient Name:   [name] Date of Exam: 01/24/2021 Medical Rec #:  [id number]      Height:       64.0 in Accession #:    [id number]     Weight:       260.6 lb Date of Birth:  06-30-1946       BSA:          2.189 m Patient Age:    42 years       BP:           99/75 mmHg Patient Gender: F              HR:           91 bpm. Exam Location:  Inpatient Procedure: Limited Echo Indications:     Pericardial effusion  History:         Patient has prior history of Echocardiogram examinations, most                  recent 01/23/2021. CHF, Pericardial Disease, Arrythmias:Atrial                  Fibrillation, Signs/Symptoms:Shortness of Breath and Chest                  Pain; Risk Factors:Hypertension. CKD.  Sonographer:     Dustin Flock RDCS Referring Phys:  [id number] [facility] J PATWARDHAN Diagnosing Phys: Adrian Prows MD IMPRESSIONS  1. US guided pericardiocentesis with aspiration of 500 cc of hemorrhagic fluid with no residual effusion. There is organized thrombus on the free wall of the RV. Limited study for  pericardiocentesis.. Moderate pericardial effusion. The pericardial effusion is circumferential. FINDINGS  Pericardium: US guided pericardiocentesis with aspiration of 500 cc of hemorrhagic fluid with no residual effusion. There is organized thrombus on the free wall of the RV. Limited study for pericardiocentesis. A moderately sized pericardial effusion is present. The pericardial effusion is circumferential. The pericardial effusion appears to contain fibrous material. There is excessive respiratory variation in septal movement. Additional Comments: There is no pleural effusion. Adrian Prows MD Electronically signed by Adrian Prows MD Signature Date/Time: 01/26/2021/6:37:46 AM    Final    ECHOCARDIOGRAM LIMITED  Result Date: 01/26/2021    ECHOCARDIOGRAM LIMITED REPORT   Patient Name:   ANNE-MARIE GENSON Date of Exam: 01/25/2021 Medical Rec #:  [id number]      Height:  64.0 in Accession #:    [id number]     Weight:       263.0 lb Date of Birth:  Jan 05, 1946       BSA:          2.198 m Patient Age:    8 years       BP:           93/62 mmHg Patient Gender: F              HR:           88 bpm. Exam Location:  Inpatient Procedure: Limited Echo, Cardiac Doppler and Color Doppler Indications:     I31.3 Pericardial effusion (noninflammatory)  History:         Patient has prior history of Echocardiogram examinations, most                  recent 01/24/2021. Pericardial Disease, Abnormal ECG,                  Arrythmias:Atrial Fibrillation, Signs/Symptoms:Dyspnea and                  Shortness of Breath; Risk Factors:Hypertension. Edema.                  Pericardial effusion. Post pericardiocentesis.  Sonographer:     Roseanna Rainbow Referring Phys:  [id number] [facility] J PATWARDHAN Diagnosing Phys: Adrian Prows MD  Sonographer Comments: Patient is morbidly obese. IMPRESSIONS  1. Left ventricular ejection fraction, by estimation, is 50 to 55%. Left ventricular ejection fraction by 2D MOD biplane is 54.4 %. The left ventricle has low normal  function. The left ventricle has no regional wall motion abnormalities. Left ventricular diastolic function could not be evaluated.  2. Right ventricular systolic function is normal. The right ventricular size is normal. There is normal pulmonary artery systolic pressure.  3. Left atrial size was moderately dilated.  4. Right atrial size was moderately dilated.  5. There is organized thrombus on the free wall of the right ventricle and the RA. Doppler's not performed. Limited echo.. The pericardial effusion is anterior to the right ventricle, localized near the right atrium and surrounding the apex.  6. The mitral valve is normal in structure. Mild mitral valve regurgitation. No evidence of mitral stenosis.  7. The aortic valve is normal in structure. Aortic valve regurgitation is not visualized. No aortic stenosis is present. Comparison(s): Compared to 01/23/2021, moderate exudative pericardial effusion with suggestion of tamponade not present. FINDINGS  Left Ventricle: Left ventricular ejection fraction, by estimation, is 50 to 55%. Left ventricular ejection fraction by 2D MOD biplane is 54.4 %. The left ventricle has low normal function. The left ventricle has no regional wall motion abnormalities. The left ventricular internal cavity size was small. There is no left ventricular hypertrophy. Left ventricular diastolic function could not be evaluated. Right Ventricle: The right ventricular size is normal. No increase in right ventricular wall thickness. Right ventricular systolic function is normal. There is normal pulmonary artery systolic pressure. The tricuspid regurgitant velocity is 2.22 m/s, and  with an assumed right atrial pressure of 3 mmHg, the estimated right ventricular systolic pressure is 32.3 mmHg. Left Atrium: Left atrial size was moderately dilated. Right Atrium: Right atrial size was moderately dilated. Pericardium: There is organized thrombus on the free wall of the right ventricle and the RA.  Doppler's not performed. Limited echo. The pericardial effusion is anterior to the right ventricle, localized near the right  atrium and surrounding the apex. Mitral Valve: The mitral valve is normal in structure. Mild mitral valve regurgitation. No evidence of mitral valve stenosis. Tricuspid Valve: The tricuspid valve is normal in structure. Tricuspid valve regurgitation is trivial. No evidence of tricuspid stenosis. Aortic Valve: The aortic valve is normal in structure. Aortic valve regurgitation is not visualized. No aortic stenosis is present. Pulmonic Valve: The pulmonic valve was normal in structure. Pulmonic valve regurgitation is not visualized. No evidence of pulmonic stenosis. Aorta: The aortic root is normal in size and structure. IAS/Shunts: No atrial level shunt detected by color flow Doppler. Additional Comments: There is no pleural effusion. LEFT VENTRICLE PLAX 2D                        Biplane EF (MOD) LVIDd:         3.85 cm         LV Biplane EF:   Left LVIDs:         2.40 cm                          ventricular LV PW:         1.10 cm                          ejection LV IVS:        1.05 cm                          fraction by                                                 2D MOD                                                 biplane is LV Volumes (MOD)                                54.4 %. LV vol d, MOD    43.8 ml A2C: LV vol d, MOD    49.9 ml A4C: LV vol s, MOD    19.4 ml A2C: LV vol s, MOD    24.1 ml A4C: LV SV MOD A2C:   24.4 ml LV SV MOD A4C:   49.9 ml LV SV MOD BP:    25.9 ml LEFT ATRIUM         Index LA diam:    3.70 cm 1.68 cm/m   AORTA Ao Root diam: 2.90 cm TRICUSPID VALVE TR Peak grad:   19.7 mmHg TR Vmax:        222.00 cm/s Adrian Prows MD Electronically signed by Adrian Prows MD Signature Date/Time: 01/26/2021/6:35:13 AM    Final      Medical Consultants:    None.   Subjective:    TALISSA APPLE relates her breathing is unchanged compared to yesterday.  Objective:     Vitals:   01/25/21 2051 01/25/21 2358 01/26/21 0415 01/26/21 0420  BP: (!) 105/59 (!) 115/57 106/85   Pulse: 89  69 92 95  Resp:  17 16 15   Temp:  97.7 F (36.5 C) 97.7 F (36.5 C)   TempSrc:  Oral Oral   SpO2:  95% 98% 96%  Weight:    119.3 kg  Height:       SpO2: 96 % O2 Flow Rate (L/min): 2 L/min   Intake/Output Summary (Last 24 hours) at 01/26/2021 0659 Last data filed at 01/25/2021 1910 Gross per 24 hour  Intake 240 ml  Output 300 ml  Net -60 ml   Filed Weights   01/24/21 0534 01/25/21 0646 01/26/21 0420  Weight: 118.2 kg 119.3 kg 119.3 kg    Exam: General exam: In no acute distress. Respiratory system: Good air movement and crackles at bases bilaterally Cardiovascular system: S1 & S2 heard, RRR.  Positive JVD Gastrointestinal system: Abdomen is nondistended, soft and nontender.  Extremities: 3+ edema Skin: No rashes, lesions or ulcers Psychiatry: Judgement and insight appear normal. Mood & affect appropriate.    Data Reviewed:    Labs: Basic Metabolic Panel: Recent Labs  Lab 01/22/21 1445 01/23/21 0110 01/23/21 0442 01/24/21 0121 01/25/21 0757 01/26/21 0048  NA 136 134*  --  133* 136 137  K 4.3 6.0*   < > 4.8 4.2 4.9  CL 95* 98  --  94* 96* 95*  CO2 31 20*  --  25 30 32  GLUCOSE 110* 101*  --  98 83 94  BUN 49* 50*  --  49* 46* 46*  CREATININE 1.98* 1.87*  --  1.87* 1.84* 1.85*  CALCIUM 8.9 8.4*  --  7.9* 8.5* 8.5*  MG  --  2.1  --   --   --   --   PHOS  --  5.0*  --   --   --   --    < > = values in this interval not displayed.   GFR Estimated Creatinine Clearance: 33.4 mL/min (A) (by C-G formula based on SCr of 1.85 mg/dL (H)). Liver Function Tests: Recent Labs  Lab 01/23/21 0110  AST 65*  ALT 19  ALKPHOS 52  BILITOT 1.0  PROT 6.2*  ALBUMIN 3.1*   No results for input(s): LIPASE, AMYLASE in the last 168 hours. No results for input(s): AMMONIA in the last 168 hours. Coagulation profile No results for input(s): INR, PROTIME in  the last 168 hours. COVID-19 Labs  No results for input(s): DDIMER, FERRITIN, LDH, CRP in the last 72 hours.  Lab Results  Component Value Date   SARSCOV2NAA NEGATIVE 01/22/2021   SARSCOV2NAA RESULT:  NEGATIVE 12/27/2019    CBC: Recent Labs  Lab 01/22/21 1445 01/23/21 0110 01/24/21 0121 01/25/21 0757  WBC 13.0* 12.4* 13.7* 9.7  NEUTROABS  --  10.3*  --   --   HGB 10.9* 11.1* 10.4* 10.4*  HCT 38.8 36.9 35.4* 37.0  MCV 73.6* 72.6* 72.7* 74.6*  PLT 465* 438* 417* 402*   Cardiac Enzymes: No results for input(s): CKTOTAL, CKMB, CKMBINDEX, TROPONINI in the last 168 hours. BNP (last 3 results) No results for input(s): PROBNP in the last 8760 hours. CBG: No results for input(s): GLUCAP in the last 168 hours. D-Dimer: No results for input(s): DDIMER in the last 72 hours. Hgb A1c: No results for input(s): HGBA1C in the last 72 hours. Lipid Profile: No results for input(s): CHOL, HDL, LDLCALC, TRIG, CHOLHDL, LDLDIRECT in the last 72 hours. Thyroid function studies: No results for input(s): TSH, T4TOTAL, T3FREE, THYROIDAB in the last 72 hours.  Invalid input(s):  FREET3 Anemia work up: No results for input(s): VITAMINB12, FOLATE, FERRITIN, TIBC, IRON, RETICCTPCT in the last 72 hours. Sepsis Labs: Recent Labs  Lab 01/22/21 1445 01/22/21 1605 01/23/21 0110 01/23/21 0442 01/24/21 0121 01/24/21 0324 01/25/21 0757  PROCALCITON  --   --   --  0.14  --  <0.10  --   WBC 13.0*  --  12.4*  --  13.7*  --  9.7  LATICACIDVEN  --  1.0  --   --   --   --   --    Microbiology Recent Results (from the past 240 hour(s))  SARS CORONAVIRUS 2 (TAT 6-24 HRS) Nasopharyngeal Nasopharyngeal Swab     Status: None   Collection Time: 01/22/21  4:12 PM   Specimen: Nasopharyngeal Swab  Result Value Ref Range Status   SARS Coronavirus 2 NEGATIVE NEGATIVE Final    Comment: (NOTE) SARS-CoV-2 target nucleic acids are NOT DETECTED.  The SARS-CoV-2 RNA is generally detectable in upper and  lower respiratory specimens during the acute phase of infection. Negative results do not preclude SARS-CoV-2 infection, do not rule out co-infections with other pathogens, and should not be used as the sole basis for treatment or other patient management decisions. Negative results must be combined with clinical observations, patient history, and epidemiological information. The expected result is Negative.  Fact Sheet for Patients: SugarRoll.be  Fact Sheet for Healthcare Providers: https://www.woods-mathews.com/  This test is not yet approved or cleared by the [place] FDA and  has been authorized for detection and/or diagnosis of SARS-CoV-2 by FDA under an Emergency Use Authorization (EUA). This EUA will remain  in effect (meaning this test can be used) for the duration of the COVID-19 declaration under Se ction 564(b)(1) of the Act, 21 U.S.C. section 360bbb-3(b)(1), unless the authorization is terminated or revoked sooner.  Performed at [facility], [place] [address] [place], [place] [address]   Body fluid culture w Gram Stain     Status: None (Preliminary result)   Collection Time: 01/24/21  9:28 AM   Specimen: PATH Cytology Misc. fluid; Body Fluid  Result Value Ref Range Status   Specimen Description FLUID  Final   Special Requests NONE  Final   Gram Stain   Final    FEW WBC PRESENT, PREDOMINANTLY PMN NO ORGANISMS SEEN    Culture   Final    NO GROWTH < 24 HOURS Performed at [facility] [address] [place], [place] [address]    Report Status PENDING  Incomplete     Medications:   . ferrous sulfate  325 mg Oral BID WC  . guaiFENesin  600 mg Oral BID  . levothyroxine  125 mcg Oral Q0600  . metoprolol tartrate  12.5 mg Oral BID  . pantoprazole  40 mg Oral Daily  . pravastatin  80 mg Oral q AM  . senna  1 tablet Oral BID  . sodium chloride flush  3 mL Intravenous Q12H   Continuous Infusions: . sodium  chloride        LOS: 4 days   Charlynne Cousins  [facility]  01/26/2021, 6:59 AM

## 2021-01-26 NOTE — Progress Notes (Signed)
Physical Therapy Treatment Patient Details Name: [name] MRN: [id number] DOB: 11/04/1946 Today's Date: 01/26/2021    History of Present Illness [name] is a 75 y.o. female with medical history significant of hypertension, persistent atrial fibrillation, obesity, and diastolic CHF who presented with worsening dyspenea, SpO2 84% on RA at cardiology appointment.    PT Comments    Pt received seated in chair, spouse present, pt agreeable to therapy session and with improved standing tolerance compared with previous session. Pt remains limited due to initial orthostatic hypotension and pt fatigues after standing 2-3 minutes but able to progress to ~69ft gait trial in room with RW and minA and pt SpO2 WNL on 2L O2 [place]. Pt continues to benefit from PT services to progress toward functional mobility goals. Continue to recommend HHPT, may consider SNF if pt symptoms orthostatic hypotension do not resolve prior to medical clearance for DC, will continue to assess.   Follow Up Recommendations  Home health PT;Supervision for mobility/OOB (may consider SNF if hypotension does not resolve)     Equipment Recommendations  None recommended by PT    Recommendations for Other Services       Precautions / Restrictions Precautions Precautions: Fall Precaution Comments: Monitor O2, BP Restrictions Weight Bearing Restrictions: No    Mobility  Bed Mobility               General bed mobility comments: pt OOB in recliner, pt reports lift bed at home    Transfers Overall transfer level: Needs assistance Equipment used: Rolling walker (2 wheeled) Transfers: Sit to/from Stand Sit to Stand: Min assist         General transfer comment: cues for hand placement; no LOB; see vitals below  Ambulation/Gait Ambulation/Gait assistance: Min assist Gait Distance (Feet): 20 Feet (chair follow) Assistive device: Rolling walker (2 wheeled) Gait Pattern/deviations: Step-to pattern;Decreased  step length - right;Decreased step length - left;Trunk flexed Gait velocity: Decreased   General Gait Details: pt quick to fatigue but BP improved after 3 mins/short gait trial; chair follow for safety, short/shuffled steps   Stairs             Wheelchair Mobility    Modified Rankin (Stroke Patients Only)       Balance Overall balance assessment: Needs assistance Sitting-balance support: Feet supported Sitting balance-Leahy Scale: Good     Standing balance support: Bilateral upper extremity supported Standing balance-Leahy Scale: Poor Standing balance comment: Reliant on B UE support in standing, min guard to minA due to sxs BLE weakness                            Cognition Arousal/Alertness: Awake/alert Behavior During Therapy: WFL for tasks assessed/performed Overall Cognitive Status: Impaired/Different from baseline Area of Impairment: Safety/judgement;Problem solving                     Memory: Decreased short-term memory   Safety/Judgement: Decreased awareness of deficits   Problem Solving: Requires verbal cues (safety cues) General Comments: pt very pleasant and receptive to instruction but noted decreased insight into deficits; [facility]      Exercises General Exercises - Lower Extremity Ankle Circles/Pumps: AROM;Both;15 reps;Seated Long Arc Quad: AROM;Both;10 reps;Seated Hip Flexion/Marching: AROM;Both;Seated;Standing;20 reps (x10 reps seated and standing) Other Exercises Other Exercises: BUE AROM: elbow flex/ext with shoulders flexed to 90 deg x10 reps (prior to BP assessment in chair)    General Comments General comments (skin integrity,  edema, etc.): SpO2 WNL on 2L O2  during mobility; BP 102/62 seated, BP 81/68 standing initially, BP 102/56 standing after 3 mins, no dizziness but BLE fatigued at 3 mins; HR 105 to 125 bpm      Pertinent Vitals/Pain Pain Assessment: Faces Faces Pain Scale: Hurts little more Pain Location: back  pain from thoracentesis site, discomfort where purewick has been inserted/in perineal area Pain Descriptors / Indicators: Discomfort;Sore Pain Intervention(s): Monitored during session;Repositioned (barrier cream applied and new purewick inserted at end of session, pillows to back for comfort)    Home Living                      Prior Function            PT Goals (current goals can now be found in the care plan section) Acute Rehab PT Goals Patient Stated Goal: wants to go home PT Goal Formulation: With patient Time For Goal Achievement: 02/07/21 Potential to Achieve Goals: Good Progress towards PT goals: Progressing toward goals    Frequency    Min 3X/week      PT Plan Current plan remains appropriate    Co-evaluation              AM-PAC PT "6 Clicks" Mobility   Outcome Measure  Help needed turning from your back to your side while in a flat bed without using bedrails?: None Help needed moving from lying on your back to sitting on the side of a flat bed without using bedrails?: A Little Help needed moving to and from a bed to a chair (including a wheelchair)?: A Little Help needed standing up from a chair using your arms (e.g., wheelchair or bedside chair)?: A Little Help needed to walk in hospital room?: A Little Help needed climbing 3-5 steps with a railing? : Total 6 Click Score: 17    End of Session Equipment Utilized During Treatment: Gait belt;Oxygen Activity Tolerance: Patient tolerated treatment well Patient left: in chair;with family/visitor present;with call bell/phone within reach Nurse Communication: Mobility status;Other (comment) (BP hypotensive in stance but improves with time standing) PT Visit Diagnosis: Unsteadiness on feet (R26.81);Muscle weakness (generalized) (M62.81)     Time: 2409-7353 PT Time Calculation (min) (ACUTE ONLY): 36 min  Charges:  $Gait Training: 8-22 mins $Therapeutic Exercise: 8-22 mins                      Larrie Lucia P., PTA Acute Rehabilitation Services Pager: [phone] Office: [place] 01/26/2021, 4:56 PM

## 2021-01-26 NOTE — Progress Notes (Signed)
Mobility Specialist - Progress Note   01/26/21 1448  Mobility  Activity Transferred:  Bed to chair  Level of Assistance Minimal assist, patient does 75% or more  Assistive Device Front wheel walker  Mobility Response Tolerated fair  Mobility performed by Mobility specialist  $Mobility charge 1 Mobility   Pre-mobility:       Laying: 114 HR, 119/71 BP      Standing: 106 HR, 78/67 BP      Standing, 3 min: 110 HR, 101/53 BP  Pt min assist to stand from bed. She denied any dizziness/lightheadedness when standing, but endorsed bilateral LE weakness. HR had a brief spike to 140 when standing, SpO2 remained ~95% on 2L O2. Pt pivoted to recliner and guided through exercises she can do when sitting up.   Pricilla Handler Mobility Specialist Mobility Specialist Phone: [phone]

## 2021-01-27 DIAGNOSIS — J90 Pleural effusion, not elsewhere classified: Secondary | ICD-10-CM

## 2021-01-27 LAB — BODY FLUID CULTURE W GRAM STAIN: Culture: NO GROWTH

## 2021-01-27 LAB — BRAIN NATRIURETIC PEPTIDE: B Natriuretic Peptide: 541.4 pg/mL — ABNORMAL HIGH (ref 0.0–100.0)

## 2021-01-27 LAB — MAGNESIUM: Magnesium: 1.9 mg/dL (ref 1.7–2.4)

## 2021-01-27 MED ORDER — APIXABAN 5 MG PO TABS
10.0000 mg | ORAL_TABLET | Freq: Two times a day (BID) | ORAL | Status: DC
  Administered 2021-01-27 – 2021-01-28 (×4): 10 mg via ORAL
  Filled 2021-01-27 (×5): qty 2

## 2021-01-27 MED ORDER — APIXABAN 5 MG PO TABS: 5.0000 mg | ORAL_TABLET | Freq: Two times a day (BID) | ORAL | Status: DC

## 2021-01-27 NOTE — Progress Notes (Signed)
[facility] PROGRESS NOTE    Progress Note  [name]  BHA:[id number] DOB: [birth year] DOA: 01/22/2021 PCP: Shon Baton, MD     Brief Narrative:   [name] is an 75 y.o. female past medical history of essential hypertension, chronic diastolic heart failure, persistent atrial fibrillation and obesity comes into the hospital with severe shortness of breath was seen by her cardiologist on 01/22/2019 and found to be hypoxic 83 sent to the ED and found to be in acute congestive heart failure possible left lower lobe pneumonia   Significant studies: 01/22/2021 chest x-ray showed cardiomegaly small right pleural effusion and moderate left with possible pneumonia 01/25/2021 chest x-ray showed persistent consolidation of the left lower lobe Limited 2D echo on 01/25/2021 showed a preserved EF with a right ventricular and atrial thrombus, anterior right ventricular fusion.   Antibiotics: None  Microbiology data: Blood culture: Negative till date  Procedures: 01/24/2021 pericardiocentesis and yielded 500 cc of bloody fluid.   01/24/2019 cytology of pericardial fluid yielded no malignant cells 01/26/2021 thoracocentesis did yield and 850 mL of straw-colored fluid  Assessment/Plan:   Acute respiratory failure with hypoxia due to left lower lobe pneumonia possibly acute diastolic heart failure and right-sided heart failure: She has completed her antibiotic regimen. Cardiology is on board appreciate assistance. Continue IV diuresis as she still appears fluid overloaded on physical exam this is a significantly improved today she is negative about 3 L, blood pressure has been tolerating it well. Started on oral digoxin, diltiazem and low-dose metoprolol for rate control, heart rate less than 100. Holding ACE inhibitor due to episode of hypotension and need to have space for diuresis.  Moderate to large pericardial effusion: 2D echo done on admission revealed a large pericardial  effusion. She is status post pericardiocentesis on 01/24/2021 which yielded 500 cc of hemorrhagic fluid. Repeated 2D echo showed an anterior right ventricular effusion surrounding the atrial and apex. Further management per cardiology.  Unsure if this effusion is contributing to her dyspnea.  Left pleural effusion: Seen on chest x-ray, as this is definitely asymptomatic. IR was consulted for thoracocentesis.   Requiring 2 L of oxygen.  Orthostatic hypotension Improved hypotension, cardiology recommended to stop antihypertensive medication and continue IV diuresis.  Essential hypertension Currently on metoprolol diltiazem and Lasix.  Blood pressure has been well controlled.  Persistent atrial fibrillation: Continue metoprolol and diltiazem was recently started on digoxin. Chads Vascor greater than 5. Restarted on her oral Eliquis.  Hypothyroidism: Continue Synthroid.  Microcytic anemia: Due to iron deficiency anemia continue oral iron.  Acute on chronic kidney disease stage IIIa: With a baseline of around 1.3-1.5, now 1.8 we will continue to monitor closely. Continue IV diuresis, currently on IV Lasix. We will have to be judicious about using Aldactone on this patient due to his chronic kidney disease stage IIIb and with a potassium ranging anywhere from 4.8-4.9.  Prolonged QTC: Noted.   DVT prophylaxis: Eliquis Family Communication:daughter Status is: Inpatient  Remains inpatient appropriate because:Hemodynamically unstable   Dispo: The patient is from: Home              Anticipated d/c is to: Home              Patient currently is not medically stable to d/c.   Difficult to place patient No        Code Status:     Code Status Orders  (From admission, onward)         Start  Ordered   01/22/21 2348  Full code  Continuous        01/22/21 2347        Code Status History    Date Active Date Inactive Code Status Order ID Comments User Context    05/29/2017 1726 05/31/2017 1806 Full Code [id number]  Leandrew Koyanagi, MD Inpatient   Advance Care Planning Activity        IV Access:    Peripheral IV   Procedures and diagnostic studies:   DG Chest 1 View  Result Date: 01/26/2021 CLINICAL DATA:  Post left thoracentesis. EXAM: CHEST  1 VIEW COMPARISON:  01/25/2021 FINDINGS: Improved aeration in the left lower chest. There are residual densities along the lateral aspect of the left lower chest. Negative for pneumothorax following the thoracentesis. Stable appearance of the right lung with haziness in the right lower chest. Cardiac silhouette appears to be enlarged but poorly defined. Trachea is midline. IMPRESSION: 1. Negative for pneumothorax following left thoracentesis. 2. Improved aeration in left lower chest following the thoracentesis. Electronically Signed   By: Markus Daft M.D.   On: 01/26/2021 11:49   DG Chest 1 View  Result Date: 01/25/2021 CLINICAL DATA:  Shortness of breath and cough EXAM: CHEST  1 VIEW COMPARISON:  January 22, 2021 FINDINGS: There is persistent left pleural effusion with consolidation in the left lower lung region. There is an equivocal pleural effusion on the right. There is cardiomegaly with pulmonary vascularity normal. No adenopathy. There is aortic atherosclerosis. No bone lesions. IMPRESSION: Stable cardiomegaly. Persistent airspace consolidation left lower lung region and left pleural effusion, similar to prior study. Equivocal right pleural effusion. Right lung otherwise clear. Aortic Atherosclerosis (ICD10-I70.0). Electronically Signed   By: Lowella Grip III M.D.   On: 01/25/2021 09:04   ECHOCARDIOGRAM LIMITED  Result Date: 01/26/2021    ECHOCARDIOGRAM LIMITED REPORT   Patient Name:   [name] Date of Exam: 01/25/2021 Medical Rec #:  [id number]      Height:       64.0 in Accession #:    [id number]     Weight:       263.0 lb Date of Birth:  1946-01-11       BSA:          2.198 m Patient Age:    51  years       BP:           93/62 mmHg Patient Gender: F              HR:           88 bpm. Exam Location:  Inpatient Procedure: Limited Echo, Cardiac Doppler and Color Doppler Indications:     I31.3 Pericardial effusion (noninflammatory)  History:         Patient has prior history of Echocardiogram examinations, most                  recent 01/24/2021. Pericardial Disease, Abnormal ECG,                  Arrythmias:Atrial Fibrillation, Signs/Symptoms:Dyspnea and                  Shortness of Breath; Risk Factors:Hypertension. Edema.                  Pericardial effusion. Post pericardiocentesis.  Sonographer:     Roseanna Rainbow Referring Phys:  [id number] [facility] J PATWARDHAN Diagnosing Phys: Adrian Prows MD  Sonographer Comments: Patient  is morbidly obese. IMPRESSIONS  1. Left ventricular ejection fraction, by estimation, is 50 to 55%. Left ventricular ejection fraction by 2D MOD biplane is 54.4 %. The left ventricle has low normal function. The left ventricle has no regional wall motion abnormalities. Left ventricular diastolic function could not be evaluated.  2. Right ventricular systolic function is normal. The right ventricular size is normal. There is normal pulmonary artery systolic pressure.  3. Left atrial size was moderately dilated.  4. Right atrial size was moderately dilated.  5. There is organized thrombus on the free wall of the right ventricle and the RA. Doppler's not performed. Limited echo.. The pericardial effusion is anterior to the right ventricle, localized near the right atrium and surrounding the apex.  6. The mitral valve is normal in structure. Mild mitral valve regurgitation. No evidence of mitral stenosis.  7. The aortic valve is normal in structure. Aortic valve regurgitation is not visualized. No aortic stenosis is present. Comparison(s): Compared to 01/23/2021, moderate exudative pericardial effusion with suggestion of tamponade not present. FINDINGS  Left Ventricle: Left ventricular ejection  fraction, by estimation, is 50 to 55%. Left ventricular ejection fraction by 2D MOD biplane is 54.4 %. The left ventricle has low normal function. The left ventricle has no regional wall motion abnormalities. The left ventricular internal cavity size was small. There is no left ventricular hypertrophy. Left ventricular diastolic function could not be evaluated. Right Ventricle: The right ventricular size is normal. No increase in right ventricular wall thickness. Right ventricular systolic function is normal. There is normal pulmonary artery systolic pressure. The tricuspid regurgitant velocity is 2.22 m/s, and  with an assumed right atrial pressure of 3 mmHg, the estimated right ventricular systolic pressure is 84.6 mmHg. Left Atrium: Left atrial size was moderately dilated. Right Atrium: Right atrial size was moderately dilated. Pericardium: There is organized thrombus on the free wall of the right ventricle and the RA. Doppler's not performed. Limited echo. The pericardial effusion is anterior to the right ventricle, localized near the right atrium and surrounding the apex. Mitral Valve: The mitral valve is normal in structure. Mild mitral valve regurgitation. No evidence of mitral valve stenosis. Tricuspid Valve: The tricuspid valve is normal in structure. Tricuspid valve regurgitation is trivial. No evidence of tricuspid stenosis. Aortic Valve: The aortic valve is normal in structure. Aortic valve regurgitation is not visualized. No aortic stenosis is present. Pulmonic Valve: The pulmonic valve was normal in structure. Pulmonic valve regurgitation is not visualized. No evidence of pulmonic stenosis. Aorta: The aortic root is normal in size and structure. IAS/Shunts: No atrial level shunt detected by color flow Doppler. Additional Comments: There is no pleural effusion. LEFT VENTRICLE PLAX 2D                        Biplane EF (MOD) LVIDd:         3.85 cm         LV Biplane EF:   Left LVIDs:         2.40 cm                           ventricular LV PW:         1.10 cm                          ejection LV IVS:        1.05  cm                          fraction by                                                 2D MOD                                                 biplane is LV Volumes (MOD)                                54.4 %. LV vol d, MOD    43.8 ml A2C: LV vol d, MOD    49.9 ml A4C: LV vol s, MOD    19.4 ml A2C: LV vol s, MOD    24.1 ml A4C: LV SV MOD A2C:   24.4 ml LV SV MOD A4C:   49.9 ml LV SV MOD BP:    25.9 ml LEFT ATRIUM         Index LA diam:    3.70 cm 1.68 cm/m   AORTA Ao Root diam: 2.90 cm TRICUSPID VALVE TR Peak grad:   19.7 mmHg TR Vmax:        222.00 cm/s Adrian Prows MD Electronically signed by Adrian Prows MD Signature Date/Time: 01/26/2021/6:35:13 AM    Final    IR THORACENTESIS ASP PLEURAL SPACE W/IMG GUIDE  Result Date: 01/26/2021 INDICATION: Patient with acute on chronic congestive heart failure, left pleural effusion. Request is made for diagnostic and therapeutic thoracentesis. EXAM: ULTRASOUND GUIDED DIAGNOSTIC AND THERAPEUTIC LEFT THORACENTESIS MEDICATIONS: 10 mL 1% lidocaine COMPLICATIONS: None immediate. PROCEDURE: An ultrasound guided thoracentesis was thoroughly discussed with the patient and questions answered. The benefits, risks, alternatives and complications were also discussed. The patient understands and wishes to proceed with the procedure. Written consent was obtained. Ultrasound was performed to localize and mark an adequate pocket of fluid in the left chest. The area was then prepped and draped in the normal sterile fashion. 1% Lidocaine was used for local anesthesia. Under ultrasound guidance a 6 Fr Safe-T-Centesis catheter was introduced. Thoracentesis was performed. The catheter was removed and a dressing applied. FINDINGS: A total of approximately 850 mL of dark yellow fluid was removed. Samples were sent to the laboratory as requested by the clinical team. IMPRESSION: Successful  ultrasound guided diagnostic and therapeutic left thoracentesis yielding 850 mL of pleural fluid. Read by: Brynda Greathouse PA-C Electronically Signed   By: Markus Daft M.D.   On: 01/26/2021 11:15     Medical Consultants:    None.   Subjective:    EVALETTE MONTROSE relates her breathing is significantly better after thoracocentesis she had a good night sleep and feels overall better.  She relates she still does not have an appetite.  Objective:    Vitals:   01/27/21 0100 01/27/21 0400 01/27/21 0559 01/27/21 0600  BP: 114/74 114/60 122/72 122/72  Pulse: 93 91  95  Resp: 20 15  18   Temp: (!) 97.5 F (36.4 C) (!) 97.5 F (36.4 C)    TempSrc: Oral Oral    SpO2:  97% 98%  98%  Weight:    116.1 kg  Height:       SpO2: 98 % O2 Flow Rate (L/min): 2 L/min   Intake/Output Summary (Last 24 hours) at 01/27/2021 0707 Last data filed at 01/27/2021 0440 Gross per 24 hour  Intake --  Output 2750 ml  Net -2750 ml   Filed Weights   01/25/21 0646 01/26/21 0420 01/27/21 0600  Weight: 119.3 kg 119.3 kg 116.1 kg    Exam: General exam: In no acute distress. Respiratory system: Good air movement and clear to auscultation. Cardiovascular system: S1 & S2 heard, RRR.  Positive JVD Gastrointestinal system: Abdomen is nondistended, soft and nontender.  Extremities: 3+ lower extremity edema, she also has sacral edema Skin: No rashes, lesions or ulcers Psychiatry: Judgement and insight appear normal. Mood & affect appropriate.  Data Reviewed:    Labs: Basic Metabolic Panel: Recent Labs  Lab 01/22/21 1445 01/23/21 0110 01/23/21 0442 01/24/21 0121 01/25/21 0757 01/26/21 0048  NA 136 134*  --  133* 136 137  K 4.3 6.0*   < > 4.8 4.2 4.9  CL 95* 98  --  94* 96* 95*  CO2 31 20*  --  25 30 32  GLUCOSE 110* 101*  --  98 83 94  BUN 49* 50*  --  49* 46* 46*  CREATININE 1.98* 1.87*  --  1.87* 1.84* 1.85*  CALCIUM 8.9 8.4*  --  7.9* 8.5* 8.5*  MG  --  2.1  --   --   --   --   PHOS  --  5.0*   --   --   --   --    < > = values in this interval not displayed.   GFR Estimated Creatinine Clearance: 32.9 mL/min (A) (by C-G formula based on SCr of 1.85 mg/dL (H)). Liver Function Tests: Recent Labs  Lab 01/23/21 0110  AST 65*  ALT 19  ALKPHOS 52  BILITOT 1.0  PROT 6.2*  ALBUMIN 3.1*   No results for input(s): LIPASE, AMYLASE in the last 168 hours. No results for input(s): AMMONIA in the last 168 hours. Coagulation profile No results for input(s): INR, PROTIME in the last 168 hours. COVID-19 Labs  No results for input(s): DDIMER, FERRITIN, LDH, CRP in the last 72 hours.  Lab Results  Component Value Date   SARSCOV2NAA NEGATIVE 01/22/2021   SARSCOV2NAA RESULT:  NEGATIVE 12/27/2019    CBC: Recent Labs  Lab 01/22/21 1445 01/23/21 0110 01/24/21 0121 01/25/21 0757  WBC 13.0* 12.4* 13.7* 9.7  NEUTROABS  --  10.3*  --   --   HGB 10.9* 11.1* 10.4* 10.4*  HCT 38.8 36.9 35.4* 37.0  MCV 73.6* 72.6* 72.7* 74.6*  PLT 465* 438* 417* 402*   Cardiac Enzymes: No results for input(s): CKTOTAL, CKMB, CKMBINDEX, TROPONINI in the last 168 hours. BNP (last 3 results) No results for input(s): PROBNP in the last 8760 hours. CBG: No results for input(s): GLUCAP in the last 168 hours. D-Dimer: No results for input(s): DDIMER in the last 72 hours. Hgb A1c: No results for input(s): HGBA1C in the last 72 hours. Lipid Profile: No results for input(s): CHOL, HDL, LDLCALC, TRIG, CHOLHDL, LDLDIRECT in the last 72 hours. Thyroid function studies: No results for input(s): TSH, T4TOTAL, T3FREE, THYROIDAB in the last 72 hours.  Invalid input(s): FREET3 Anemia work up: No results for input(s): VITAMINB12, FOLATE, FERRITIN, TIBC, IRON, RETICCTPCT in the last 72 hours. Sepsis Labs: Recent Labs  Lab 01/22/21  1445 01/22/21 1605 01/23/21 0110 01/23/21 0442 01/24/21 0121 01/24/21 0324 01/25/21 0757  PROCALCITON  --   --   --  0.14  --  <0.10  --   WBC 13.0*  --  12.4*  --  13.7*  --   9.7  LATICACIDVEN  --  1.0  --   --   --   --   --    Microbiology Recent Results (from the past 240 hour(s))  SARS CORONAVIRUS 2 (TAT 6-24 HRS) Nasopharyngeal Nasopharyngeal Swab     Status: None   Collection Time: 01/22/21  4:12 PM   Specimen: Nasopharyngeal Swab  Result Value Ref Range Status   SARS Coronavirus 2 NEGATIVE NEGATIVE Final    Comment: (NOTE) SARS-CoV-2 target nucleic acids are NOT DETECTED.  The SARS-CoV-2 RNA is generally detectable in upper and lower respiratory specimens during the acute phase of infection. Negative results do not preclude SARS-CoV-2 infection, do not rule out co-infections with other pathogens, and should not be used as the sole basis for treatment or other patient management decisions. Negative results must be combined with clinical observations, patient history, and epidemiological information. The expected result is Negative.  Fact Sheet for Patients: SugarRoll.be  Fact Sheet for Healthcare Providers: https://www.woods-mathews.com/  This test is not yet approved or cleared by the [place] FDA and  has been authorized for detection and/or diagnosis of SARS-CoV-2 by FDA under an Emergency Use Authorization (EUA). This EUA will remain  in effect (meaning this test can be used) for the duration of the COVID-19 declaration under Se ction 564(b)(1) of the Act, 21 U.S.C. section 360bbb-3(b)(1), unless the authorization is terminated or revoked sooner.  Performed at [facility], [place] [address], [place], [place] [address]   Body fluid culture w Gram Stain     Status: None (Preliminary result)   Collection Time: 01/24/21  9:28 AM   Specimen: PATH Cytology Misc. fluid; Body Fluid  Result Value Ref Range Status   Specimen Description FLUID  Final   Special Requests NONE  Final   Gram Stain   Final    FEW WBC PRESENT, PREDOMINANTLY PMN NO ORGANISMS SEEN    Culture   Final    NO GROWTH 2  DAYS Performed at [facility], [address] [place], [place] [address]    Report Status PENDING  Incomplete  Gram stain     Status: None   Collection Time: 01/26/21 10:55 AM   Specimen: Pleura  Result Value Ref Range Status   Specimen Description PLEURAL FLUID LUNG LEFT  Final   Special Requests LUNG LEFT  Final   Gram Stain   Final    ABUNDANT WBC PRESENT,BOTH PMN AND MONONUCLEAR NO ORGANISMS SEEN Performed at [facility], [address], [place], [place] [address]    Report Status 01/26/2021 FINAL  Final     Medications:   . amiodarone  200 mg Oral Daily  . digoxin  0.125 mg Oral Daily  . diltiazem  30 mg Oral Q8H  . ferrous sulfate  325 mg Oral BID WC  . furosemide  40 mg Intravenous Q12H  . guaiFENesin  600 mg Oral BID  . levothyroxine  125 mcg Oral Q0600  . metoprolol tartrate  12.5 mg Oral BID  . pantoprazole  40 mg Oral Daily  . pravastatin  80 mg Oral q AM  . senna  1 tablet Oral BID  . sodium chloride flush  3 mL Intravenous  Q12H   Continuous Infusions: . sodium chloride        LOS: 5 days   Charlynne Cousins  [facility]  01/27/2021, 7:07 AM

## 2021-01-27 NOTE — Progress Notes (Signed)
Subjective:  Patient seen and examined at bedside at approximately 10:18 AM. Patient is accompanied by her husband at bedside. No significant events overnight. Patient states that she is improving, slowly. Continues to have some lower extremity swelling and shortness of breath. Has tolerated the initiation of amiodarone and Eliquis well without any side effects or intolerances since yesterday.   Patient denies chest pain, lightheadedness, dizziness,  Net IO Since Admission: -3,105.3 mL [01/27/21 1125]  Blood pressure 111/60, pulse 78, temperature 97.6 F (36.4 C), temperature source Oral, resp. rate 15, height 5' 4"  (1.626 m), weight 116.1 kg, SpO2 92 %. Body mass index is 43.93 kg/m.  Physical Exam Constitutional:      Appearance: She is morbidly obese. She is ill-appearing.     Interventions: Nasal cannula in place.  HENT:     Head: Atraumatic.  Cardiovascular:     Rate and Rhythm: Rhythm irregularly irregular.     Pulses:          Carotid pulses are 2+ on the right side and 2+ on the left side.      Femoral pulses are 2+ on the right side and 2+ on the left side.      Dorsalis pedis pulses are 0 on the right side and 0 on the left side.       Posterior tibial pulses are 0 on the right side and 0 on the left side.     Heart sounds: No murmur heard. No gallop. No S3 or S4 sounds.      Comments: S1 is variable, S2 is normal. JVD elevated up to the angle of jaw.  1+ bilateral pitting edema.  Warm to touch bilaterally.    TED hose stockings present.  No skin breakdown. Pulmonary:     Effort: Pulmonary effort is normal.     Breath sounds: Examination of the right-middle field reveals rales. Examination of the left-middle field reveals rales. Examination of the right-lower field reveals rales. Examination of the left-lower field reveals decreased breath sounds and rales. Decreased breath sounds and rales present.  Abdominal:     General: Bowel sounds are normal.     Palpations:  Abdomen is soft.    Lab Results: BNP (last 3 results) Recent Labs    12/25/20 1311 01/22/21 1605 01/27/21 0144  BNP 265.7* 310.6* 541.4*    BMP Latest Ref Rng & Units 01/26/2021 01/25/2021 01/24/2021  Glucose 70 - 99 mg/dL 94 83 98  BUN 8 - 23 mg/dL 46(H) 46(H) 49(H)  Creatinine 0.44 - 1.00 mg/dL 1.85(H) 1.84(H) 1.87(H)  Sodium 135 - 145 mmol/L 137 136 133(L)  Potassium 3.5 - 5.1 mmol/L 4.9 4.2 4.8  Chloride 98 - 111 mmol/L 95(L) 96(L) 94(L)  CO2 22 - 32 mmol/L 32 30 25  Calcium 8.9 - 10.3 mg/dL 8.5(L) 8.5(L) 7.9(L)   Hepatic Function Latest Ref Rng & Units 01/23/2021 05/20/2017 04/16/2017  Total Protein 6.5 - 8.1 g/dL 6.2(L) 7.1 7.1  Albumin 3.5 - 5.0 g/dL 3.1(L) 4.1 3.8  AST 15 - 41 U/L 65(H) 20 23  ALT 0 - 44 U/L 19 13(L) 14  Alk Phosphatase 38 - 126 U/L 52 56 60  Total Bilirubin 0.3 - 1.2 mg/dL 1.0 0.7 0.5   CBC Latest Ref Rng & Units 01/25/2021 01/24/2021 01/23/2021  WBC 4.0 - 10.5 K/uL 9.7 13.7(H) 12.4(H)  Hemoglobin 12.0 - 15.0 g/dL 10.4(L) 10.4(L) 11.1(L)  Hematocrit 36.0 - 46.0 % 37.0 35.4(L) 36.9  Platelets 150 - 400 K/uL 402(H) 417(H) 438(H)  Lipid Panel  No results found for: CHOL, TRIG, HDL, CHOLHDL, VLDL, LDLCALC, LDLDIRECT Cardiac Panel (last 3 results) No results for input(s): CKTOTAL, CKMB, TROPONINI, RELINDX in the last 72 hours.  HEMOGLOBIN A1C No results found for: HGBA1C, MPG TSH Recent Labs    01/23/21 0110  TSH 1.499   Imaging: Chest Xray 01/25/2021: Stable cardiomegaly. Persistent airspace consolidation left lower lung region and left pleural effusion, similar to prior study. Equivocal right pleural effusion. Right lung otherwise clear. Aortic Atherosclerosis (ICD10-I70.0).  Cardiac Studies:  EKG 01/28/2020: Atrial fibrillation, 106 bpm, ST and T wave changes in inferolateral leads cannot rule out ischemia.  Similar findings to prior EKG from 01/27/2020.  Pericardiocentesis 01/24/2021: Successful ultrasound and radiographic and EKG assisted  pericardiocentesis with successful aspiration of hemorrhagic 500 mL of fluid. Fluid sent for Gram stain and cytology.   Echocardiogram 01/23/2021: 1. Left ventricular ejection fraction, by estimation, is 50 to 55%. The  left ventricle has low normal function. The left ventricle has no regional  wall motion abnormalities. Left ventricular diastolic parameters are  indeterminate. There is Respiratory  bounce concerning for tamponade physiology.  2. Right ventricular systolic function is normal. The right ventricular  size is normal. There is normal pulmonary artery systolic pressure.  3. Left atrial size was mildly dilated.  4. Right atrial size was mildly dilated.  5. The mitral valve is grossly normal. Mild mitral valve regurgitation.  6. Aortic valve is probably trileaflet with moderate calcification.  Inadequate Doppler evaluation to evaluate aortic stenosis. . The aortic  valve is calcified. Aortic valve regurgitation is not visualized.  7. The inferior vena cava is dilated in size with <50% respiratory  variability, suggesting right atrial pressure of 15 mmHg.  8. Moderate to large circumferential pericardial effusion. Largest  dimension measures 2.5 cm anterior to RV in subcostal window. Mitral and  tricuspid inflow respiratory variation, dilated IVC, and interventricular  septal boucne concerning for tamponade  physiology. Clinical correlation recommended. . Moderate pericardial  effusion.  9. Compared to previous outpatient study in 2018, pericardial effusion is  new.   Echocardiogram 01/25/2021:   1. Left ventricular ejection fraction, by estimation, is 50 to 55%. Left ventricular ejection fraction by 2D MOD biplane is 54.4 %. The left ventricle has low normal function. The left ventricle has no regional wall motion abnormalities. Left  ventricular diastolic function could not be evaluated.  2. Right ventricular systolic function is normal. The right ventricular size is normal.  There is normal pulmonary artery systolic pressure.  3. Left atrial size was moderately dilated.  4. Right atrial size was moderately dilated.  5. There is organized thrombus on the free wall of the right ventricle and the RA. Doppler's not performed. Limited echo.. The pericardial effusion is anterior to the right ventricle, localized near the right atrium and surrounding the apex.  6. The mitral valve is normal in structure. Mild mitral valve regurgitation. No evidence of mitral stenosis.  7. The aortic valve is normal in structure. Aortic valve regurgitation is not visualized. No aortic stenosis is present.  Comparison(s): Compared to 01/23/2021, moderate exudative pericardial effusion with suggestion of tamponade not present.  Scheduled Meds: . amiodarone  200 mg Oral Daily  . apixaban  10 mg Oral BID   Followed by  . [START ON 02/03/2021] apixaban  5 mg Oral BID  . digoxin  0.125 mg Oral Daily  . diltiazem  30 mg Oral Q8H  . ferrous sulfate  325 mg Oral BID WC  .  furosemide  40 mg Intravenous Q12H  . guaiFENesin  600 mg Oral BID  . levothyroxine  125 mcg Oral Q0600  . metoprolol tartrate  12.5 mg Oral BID  . pantoprazole  40 mg Oral Daily  . pravastatin  80 mg Oral q AM  . senna  1 tablet Oral BID  . sodium chloride flush  3 mL Intravenous Q12H   Continuous Infusions: . sodium chloride     PRN Meds:.sodium chloride, acetaminophen **OR** acetaminophen, albuterol, bisacodyl, HYDROcodone-acetaminophen, polyethylene glycol, sodium chloride flush  Assessment/Plan:  1.  Hemorrhagic pericardial effusion SP pericardiocentesis on 01/24/2021 with 500 mL aspirate.  No malignant cells evident. 2.  Acute on chronic diastolic heart failure 3.  Permanent atrial fibrillation CHA2DS2-VASc Score is 5.  Yearly risk of stroke: 7.2% (A, F, HTN CHF).  Score of 1=0.6; 2=2.2; 3=3.2; 4=4.8; 5=7.2; 6=9.8; 7=>9.8) -(CHF; HTN; vasc disease DM,  Female = 1; Age <65 =0; 65-74 = 1,  >75 =2; stroke/embolism=  2).   4.  Shortness of breath and dyspnea exertion of multifactorial etiology including the above and also morbid obesity with obesity hypoventilation syndrome. 5.  Chronic kidney disease stage IIIb 6.  Bilateral pleural effusion, left greater than the right, s/p thoracentesis.   Recommendation: Patient is currently being diuresed given her underlying diagnosis of acute on chronic heart failure with preserved EF.  Diuresis has been challenging as her blood pressures have been soft.  Since admission she has diuresed approximately 3 L.  Would recommend to continue diuresis at this time as she continues to have lower extremity swelling, crackles on examination, BNP trending up.  Strict I's and O's, daily weights, low-salt diet.  Check BMP and magnesium level for today and will add digoxin level to the morning labs.  Recommend focusing on diuresis for now and then uptitrating her guideline directed medical therapy as her hemodynamics and kidney function allows.    Patient has tolerated initiation of amiodarone oral anticoagulation given her history of atrial fibrillation and thrombus on the free wall of the RV.    We will continue to monitor her urine output, hemodynamics, and laboratory values.    Continue care regarding her chronic comorbid conditions.    Plan of care discussed with her husband at bedside and nursing staff as well.  Rex Kras, [place], [facility]  Pager: [phone] Office: [phone]

## 2021-01-27 NOTE — Progress Notes (Signed)
ANTICOAGULATION CONSULT NOTE - Initial Consult  Pharmacy Consult for apixaban Indication: atrial fibrillation, RV and RA thrombus  Allergies  Allergen Reactions  . Atorvastatin Other (See Comments)    Aches     Patient Measurements: Height: 5\' 4"  (162.6 cm) Weight: 116.1 kg (255 lb 15.3 oz) IBW/kg (Calculated) : 54.7  Vital Signs: Temp: 97.5 F (36.4 C) (02/26 0400) Temp Source: Oral (02/26 0400) BP: 122/72 (02/26 0600) Pulse Rate: 95 (02/26 0600)  Labs: Recent Labs    01/25/21 0757 01/26/21 0048  HGB 10.4*  --   HCT 37.0  --   PLT 402*  --   CREATININE 1.84* 1.85*    Estimated Creatinine Clearance: 32.9 mL/min (A) (by C-G formula based on SCr of 1.85 mg/dL (H)).   Medical History: Past Medical History:  Diagnosis Date  . Arthritis   . Atrial fibrillation ([place])   . CHF (congestive heart failure) ([place])   . CKD (chronic kidney disease), stage III ([place])   . Colon polyps   . Dysrhythmia    new onset afib   . Goiter   . Hyperglycemia   . Hyperlipidemia   . Hypertension   . Hypothyroidism   . Morbid obesity ([place])   . Osteopenia     Medications:  Scheduled:  . amiodarone  200 mg Oral Daily  . digoxin  0.125 mg Oral Daily  . diltiazem  30 mg Oral Q8H  . ferrous sulfate  325 mg Oral BID WC  . furosemide  40 mg Intravenous Q12H  . guaiFENesin  600 mg Oral BID  . levothyroxine  125 mcg Oral Q0600  . metoprolol tartrate  12.5 mg Oral BID  . pantoprazole  40 mg Oral Daily  . pravastatin  80 mg Oral q AM  . senna  1 tablet Oral BID  . sodium chloride flush  3 mL Intravenous Q12H    Assessment: 74yoF presented with shortness of breath and low O2 sats, found to have a large pericardial effusion. PMH s/f atrial fibrillation on apixaban. Apixaban was held for pericardiocentesis on 2/23. Of note, ECHO completed on 2/22 reveals RV and RA thrombus. Pharmacy has been consulted to restart apixaban with VTE treatment dosing.  Last dose of apixaban was 2/22. Hgb  stable 10-11, PLT wnl. No s/sx bleeding reported per RN.  Goal of Therapy:  Monitor platelets by anticoagulation protocol: Yes   Plan:  Initiate apixaban 10mg  BID x7d then continue 5mg  BID Monitor CBC daily and signs/symptoms of bleeding  Mercy Riding, PharmD PGY1 Acute Care Pharmacy Resident Please refer to [facility] for unit-specific pharmacist

## 2021-01-27 NOTE — Progress Notes (Signed)
Mobility Specialist: Progress Note   01/27/21 1215  Mobility  Activity Stood at bedside  Level of Assistance Minimal assist, patient does 75% or more  Assistive Device Front wheel walker  Mobility Response Tolerated well  Mobility performed by Mobility specialist  Bed Position Chair  $Mobility charge 1 Mobility   During Mobility: 112 HR Post-Mobility: 97 HR, 96% SpO2  Pt too tired to walk but was agreeable to stand and perform leg exercises. Pt stood from chair for 3 minutes and then demonstrated leg exercises while sitting in chair. Encouraged pt to continue leg exercises in chair on her own throughout the [place].   [facility] [place] Mobility Specialist Mobility Specialist Phone: [phone]

## 2021-01-28 LAB — CBC WITH DIFFERENTIAL/PLATELET
Abs Immature Granulocytes: 0.08 10*3/uL — ABNORMAL HIGH (ref 0.00–0.07)
Basophils Absolute: 0.1 10*3/uL (ref 0.0–0.1)
Basophils Relative: 0 %
Eosinophils Absolute: 0.1 10*3/uL (ref 0.0–0.5)
Eosinophils Relative: 1 %
HCT: 39.5 % (ref 36.0–46.0)
Hemoglobin: 11.8 g/dL — ABNORMAL LOW (ref 12.0–15.0)
Immature Granulocytes: 1 %
Lymphocytes Relative: 3 %
Lymphs Abs: 0.4 10*3/uL — ABNORMAL LOW (ref 0.7–4.0)
MCH: 21.5 pg — ABNORMAL LOW (ref 26.0–34.0)
MCHC: 29.9 g/dL — ABNORMAL LOW (ref 30.0–36.0)
MCV: 71.8 fL — ABNORMAL LOW (ref 80.0–100.0)
Monocytes Absolute: 1.2 10*3/uL — ABNORMAL HIGH (ref 0.1–1.0)
Monocytes Relative: 8 %
Neutro Abs: 12.3 10*3/uL — ABNORMAL HIGH (ref 1.7–7.7)
Neutrophils Relative %: 87 %
Platelets: 473 10*3/uL — ABNORMAL HIGH (ref 150–400)
RBC: 5.5 MIL/uL — ABNORMAL HIGH (ref 3.87–5.11)
RDW: 19.7 % — ABNORMAL HIGH (ref 11.5–15.5)
WBC: 14.1 10*3/uL — ABNORMAL HIGH (ref 4.0–10.5)
nRBC: 0 % (ref 0.0–0.2)

## 2021-01-28 LAB — BASIC METABOLIC PANEL
Anion gap: 12 (ref 5–15)
BUN: 20 mg/dL (ref 8–23)
CO2: 34 mmol/L — ABNORMAL HIGH (ref 22–32)
Calcium: 8.6 mg/dL — ABNORMAL LOW (ref 8.9–10.3)
Chloride: 92 mmol/L — ABNORMAL LOW (ref 98–111)
Creatinine, Ser: 1.37 mg/dL — ABNORMAL HIGH (ref 0.44–1.00)
GFR, Estimated: 40 mL/min — ABNORMAL LOW (ref >60)
Glucose, Bld: 90 mg/dL (ref 70–99)
Potassium: 4 mmol/L (ref 3.5–5.1)
Sodium: 138 mmol/L (ref 135–145)

## 2021-01-28 LAB — DIGOXIN LEVEL: Digoxin Level: 1.8 ng/mL (ref 0.8–2.0)

## 2021-01-28 LAB — BRAIN NATRIURETIC PEPTIDE: B Natriuretic Peptide: 635.2 pg/mL — ABNORMAL HIGH (ref 0.0–100.0)

## 2021-01-28 NOTE — Plan of Care (Signed)

## 2021-01-28 NOTE — Progress Notes (Signed)
Mobility Specialist: Progress Note   01/28/21 1204  Mobility  Activity Ambulated in room  Level of Assistance Minimal assist, patient does 75% or more  Assistive Device Front wheel walker  Distance Ambulated (ft) 110 ft (40'x2, then 30')  Mobility Response Tolerated well  Mobility performed by Mobility specialist  Bed Position Chair  $Mobility charge 1 Mobility   Pre-Mobility: 99 HR, 123/69 BP, 93% SpO2 During Mobility: 95% SpO2 Post-Mobility: 95 HR, 106/68 BP, 95% SpO2  Pt performed three bouts of walking with a seated break between each walk lasting 1 minute. Pt c/o feeling a little SOB after each bout. Pt to chair after walk, RN present in room.   [facility] [place] Mobility Specialist Mobility Specialist Phone: [phone]

## 2021-01-28 NOTE — Progress Notes (Signed)
[facility] PROGRESS NOTE    Progress Note  [name]  TGG:[id number] DOB: Nov 01, 1946 DOA: 01/22/2021 PCP: Shon Baton, MD     Brief Narrative:   [name] is an 75 y.o. female past medical history of essential hypertension, chronic diastolic heart failure, persistent atrial fibrillation and obesity comes into the hospital with severe shortness of breath was seen by her cardiologist on 01/22/2019 and found to be hypoxic 83 sent to the ED and found to be in acute congestive heart failure possible left lower lobe pneumonia   Significant studies: 01/22/2021 chest x-ray showed cardiomegaly small right pleural effusion and moderate left with possible pneumonia 01/25/2021 chest x-ray showed persistent consolidation of the left lower lobe Limited 2D echo on 01/25/2021 showed a preserved EF with a right ventricular and atrial thrombus, anterior right ventricular fusion.  Antibiotics: None  Microbiology data: Blood culture: Negative till date  Procedures: 01/24/2021 pericardiocentesis and yielded 500 cc of bloody fluid.   01/24/2019 cytology of pericardial fluid yielded no malignant cells 01/26/2021 thoracocentesis did yield and 850 mL of straw-colored fluid  Assessment/Plan:   Acute respiratory failure with hypoxia due to left lower lobe pneumonia possibly acute diastolic heart failure and right-sided heart failure: She has completed her antibiotic regimen. Cardiology is on board appreciate assistance. Continue IV diuresis blood pressure is tolerating it well. She is negative about 5 L today. She still appears fluid overloaded on physical exam, her urine still appears significantly clear. She was weaned to room air yesterday but overnight apparently was placed back on oxygen.  She probably has a diagnosed obstructive sleep apnea Continue oral digoxin, amiodarone, diltiazem and low-dose metoprolol, her heart rate has been less than 100. At this point in time she is not a  candidate for an ACE inhibitor due to her episode of hypotension. With her blood pressure seems to be tolerating diuresis well.   Might need to go home on an ACE inhibitor as an outpatient.  Moderate to large pericardial effusion: 2D echo done on admission revealed a large pericardial effusion. She is status post pericardiocentesis on 01/24/2021 which yielded 500 cc of hemorrhagic fluid. Repeated 2D echo showed an anterior right ventricular effusion surrounding the atrial and apex. Further management per cardiology.    Left pleural effusion: Seen on chest x-ray, as this is definitely asymptomatic. IR was consulted for thoracocentesis.   Wean to room air.  Orthostatic hypotension Improved hypotension, cardiology recommended to stop antihypertensive medication and continue IV diuresis.  Essential hypertension Currently on metoprolol diltiazem and Lasix.  Blood pressure has been well controlled.  Persistent atrial fibrillation: Continue metoprolol, amiodarone, digoxin and diltiazem. Her heart rate seems to be tolerating it well is less than 100. She has a CHA2DS2-VASc score greater than 5. Continue oral anticoagulation with Eliquis  Hypothyroidism: Continue Synthroid.  Microcytic anemia: Due to iron deficiency anemia continue oral iron.  Acute on chronic kidney disease stage IIIa: With a baseline of around 1.3-1.5, now 1.8 we will continue to monitor closely. Continue IV diuresis, currently on IV Lasix. We will have to be judicious about using Aldactone on this patient due to his chronic kidney disease stage IIIb and with a potassium ranging anywhere from 4.8-4.9.  Prolonged QTC: Noted.   DVT prophylaxis: Eliquis Family Communication:daughter Status is: Inpatient  Remains inpatient appropriate because:Hemodynamically unstable   Dispo: The patient is from: Home              Anticipated d/c is to: Home  Patient currently is not medically stable to d/c.    Difficult to place patient No        Code Status:     Code Status Orders  (From admission, onward)         Start     Ordered   01/22/21 2348  Full code  Continuous        01/22/21 2347        Code Status History    Date Active Date Inactive Code Status Order ID Comments User Context   05/29/2017 1726 05/31/2017 1806 Full Code [id number]  Leandrew Koyanagi, MD Inpatient   Advance Care Planning Activity        IV Access:    Peripheral IV   Procedures and diagnostic studies:   DG Chest 1 View  Result Date: 01/26/2021 CLINICAL DATA:  Post left thoracentesis. EXAM: CHEST  1 VIEW COMPARISON:  01/25/2021 FINDINGS: Improved aeration in the left lower chest. There are residual densities along the lateral aspect of the left lower chest. Negative for pneumothorax following the thoracentesis. Stable appearance of the right lung with haziness in the right lower chest. Cardiac silhouette appears to be enlarged but poorly defined. Trachea is midline. IMPRESSION: 1. Negative for pneumothorax following left thoracentesis. 2. Improved aeration in left lower chest following the thoracentesis. Electronically Signed   By: Markus Daft M.D.   On: 01/26/2021 11:49   IR THORACENTESIS ASP PLEURAL SPACE W/IMG GUIDE  Result Date: 01/26/2021 INDICATION: Patient with acute on chronic congestive heart failure, left pleural effusion. Request is made for diagnostic and therapeutic thoracentesis. EXAM: ULTRASOUND GUIDED DIAGNOSTIC AND THERAPEUTIC LEFT THORACENTESIS MEDICATIONS: 10 mL 1% lidocaine COMPLICATIONS: None immediate. PROCEDURE: An ultrasound guided thoracentesis was thoroughly discussed with the patient and questions answered. The benefits, risks, alternatives and complications were also discussed. The patient understands and wishes to proceed with the procedure. Written consent was obtained. Ultrasound was performed to localize and mark an adequate pocket of fluid in the left chest. The area was then  prepped and draped in the normal sterile fashion. 1% Lidocaine was used for local anesthesia. Under ultrasound guidance a 6 Fr Safe-T-Centesis catheter was introduced. Thoracentesis was performed. The catheter was removed and a dressing applied. FINDINGS: A total of approximately 850 mL of dark yellow fluid was removed. Samples were sent to the laboratory as requested by the clinical team. IMPRESSION: Successful ultrasound guided diagnostic and therapeutic left thoracentesis yielding 850 mL of pleural fluid. Read by: Brynda Greathouse PA-C Electronically Signed   By: Markus Daft M.D.   On: 01/26/2021 11:15     Medical Consultants:    None.   Subjective:    JAZLYNN NEMETZ relates her appetite is improving nicely, relates her breathing is back to baseline.  Objective:    Vitals:   01/27/21 1600 01/27/21 1931 01/27/21 2312 01/28/21 0335  BP: (!) 150/83 105/63 132/72 121/75  Pulse: 87 82 82 97  Resp: 17 16 16 16   Temp: 97.7 F (36.5 C) (!) 97.4 F (36.3 C) 97.6 F (36.4 C) 97.6 F (36.4 C)  TempSrc: Oral Oral Oral Oral  SpO2: 90% 91% 91% 98%  Weight:      Height:       SpO2: 98 % O2 Flow Rate (L/min): 1 L/min   Intake/Output Summary (Last 24 hours) at 01/28/2021 0737 Last data filed at 01/28/2021 0336 Gross per 24 hour  Intake --  Output 1830 ml  Net -1830 ml   [employer]  01/25/21 0646 01/26/21 0420 01/27/21 0600  Weight: 119.3 kg 119.3 kg 116.1 kg    Exam: General exam: In no acute distress. Respiratory system: Good air movement and clear to auscultation. Cardiovascular system: S1 & S2 heard, RRR. No JVD. Gastrointestinal system: Abdomen is nondistended, soft and nontender.  Extremities: No pedal edema. Skin: No rashes, lesions or ulcers Psychiatry: Judgement and insight appear normal. Mood & affect appropriate.  Data Reviewed:    Labs: Basic Metabolic Panel: Recent Labs  Lab 01/22/21 1445 01/23/21 0110 01/23/21 8841 01/24/21 0121 01/25/21 0757  01/26/21 0048 01/27/21 0144  NA 136 134*  --  133* 136 137  --   K 4.3 6.0*   < > 4.8 4.2 4.9  --   CL 95* 98  --  94* 96* 95*  --   CO2 31 20*  --  25 30 32  --   GLUCOSE 110* 101*  --  98 83 94  --   BUN 49* 50*  --  49* 46* 46*  --   CREATININE 1.98* 1.87*  --  1.87* 1.84* 1.85*  --   CALCIUM 8.9 8.4*  --  7.9* 8.5* 8.5*  --   MG  --  2.1  --   --   --   --  1.9  PHOS  --  5.0*  --   --   --   --   --    < > = values in this interval not displayed.   GFR Estimated Creatinine Clearance: 32.9 mL/min (A) (by C-G formula based on SCr of 1.85 mg/dL (H)). Liver Function Tests: Recent Labs  Lab 01/23/21 0110  AST 65*  ALT 19  ALKPHOS 52  BILITOT 1.0  PROT 6.2*  ALBUMIN 3.1*   No results for input(s): LIPASE, AMYLASE in the last 168 hours. No results for input(s): AMMONIA in the last 168 hours. Coagulation profile No results for input(s): INR, PROTIME in the last 168 hours. COVID-19 Labs  No results for input(s): DDIMER, FERRITIN, LDH, CRP in the last 72 hours.  Lab Results  Component Value Date   SARSCOV2NAA NEGATIVE 01/22/2021   SARSCOV2NAA RESULT:  NEGATIVE 12/27/2019    CBC: Recent Labs  Lab 01/22/21 1445 01/23/21 0110 01/24/21 0121 01/25/21 0757  WBC 13.0* 12.4* 13.7* 9.7  NEUTROABS  --  10.3*  --   --   HGB 10.9* 11.1* 10.4* 10.4*  HCT 38.8 36.9 35.4* 37.0  MCV 73.6* 72.6* 72.7* 74.6*  PLT 465* 438* 417* 402*   Cardiac Enzymes: No results for input(s): CKTOTAL, CKMB, CKMBINDEX, TROPONINI in the last 168 hours. BNP (last 3 results) No results for input(s): PROBNP in the last 8760 hours. CBG: No results for input(s): GLUCAP in the last 168 hours. D-Dimer: No results for input(s): DDIMER in the last 72 hours. Hgb A1c: No results for input(s): HGBA1C in the last 72 hours. Lipid Profile: No results for input(s): CHOL, HDL, LDLCALC, TRIG, CHOLHDL, LDLDIRECT in the last 72 hours. Thyroid function studies: No results for input(s): TSH, T4TOTAL, T3FREE,  THYROIDAB in the last 72 hours.  Invalid input(s): FREET3 Anemia work up: No results for input(s): VITAMINB12, FOLATE, FERRITIN, TIBC, IRON, RETICCTPCT in the last 72 hours. Sepsis Labs: Recent Labs  Lab 01/22/21 1445 01/22/21 1605 01/23/21 0110 01/23/21 0442 01/24/21 0121 01/24/21 0324 01/25/21 0757  PROCALCITON  --   --   --  0.14  --  <0.10  --   WBC 13.0*  --  12.4*  --  13.7*  --  9.7  LATICACIDVEN  --  1.0  --   --   --   --   --    Microbiology Recent Results (from the past 240 hour(s))  SARS CORONAVIRUS 2 (TAT 6-24 HRS) Nasopharyngeal Nasopharyngeal Swab     Status: None   Collection Time: 01/22/21  4:12 PM   Specimen: Nasopharyngeal Swab  Result Value Ref Range Status   SARS Coronavirus 2 NEGATIVE NEGATIVE Final    Comment: (NOTE) SARS-CoV-2 target nucleic acids are NOT DETECTED.  The SARS-CoV-2 RNA is generally detectable in upper and lower respiratory specimens during the acute phase of infection. Negative results do not preclude SARS-CoV-2 infection, do not rule out co-infections with other pathogens, and should not be used as the sole basis for treatment or other patient management decisions. Negative results must be combined with clinical observations, patient history, and epidemiological information. The expected result is Negative.  Fact Sheet for Patients: SugarRoll.be  Fact Sheet for Healthcare Providers: https://www.woods-mathews.com/  This test is not yet approved or cleared by the [place] FDA and  has been authorized for detection and/or diagnosis of SARS-CoV-2 by FDA under an Emergency Use Authorization (EUA). This EUA will remain  in effect (meaning this test can be used) for the duration of the COVID-19 declaration under Se ction 564(b)(1) of the Act, 21 U.S.C. section 360bbb-3(b)(1), unless the authorization is terminated or revoked sooner.  Performed at [facility], [place] [address]  [place], [place] [address]   Body fluid culture w Gram Stain     Status: None   Collection Time: 01/24/21  9:28 AM   Specimen: PATH Cytology Misc. fluid; Body Fluid  Result Value Ref Range Status   Specimen Description FLUID  Final   Special Requests NONE  Final   Gram Stain   Final    FEW WBC PRESENT, PREDOMINANTLY PMN NO ORGANISMS SEEN    Culture   Final    NO GROWTH 3 DAYS Performed at [facility], [address], [place], [place] [address]    Report Status 01/27/2021 FINAL  Final  Culture, body fluid w Gram Stain-bottle     Status: None (Preliminary result)   Collection Time: 01/26/21 10:55 AM   Specimen: Pleura  Result Value Ref Range Status   Specimen Description PLEURAL FLUID LUNG LEFT  Final   Special Requests LUNG LEFT  Final   Culture   Final    NO GROWTH 1 DAY Performed at [facility], [place] [address] [place], [place] [address]    Report Status PENDING  Incomplete  Gram stain     Status: None   Collection Time: 01/26/21 10:55 AM   Specimen: Pleura  Result Value Ref Range Status   Specimen Description PLEURAL FLUID LUNG LEFT  Final   Special Requests LUNG LEFT  Final   Gram Stain   Final    ABUNDANT WBC PRESENT,BOTH PMN AND MONONUCLEAR NO ORGANISMS SEEN Performed at [facility], [address] [place],  [address]    Report Status 01/26/2021 FINAL  Final     Medications:   . amiodarone  200 mg Oral Daily  . apixaban  10 mg Oral BID   Followed by  . [START ON 02/03/2021] apixaban  5 mg Oral BID  . digoxin  0.125 mg Oral Daily  . diltiazem  30 mg Oral Q8H  . ferrous sulfate  325 mg Oral BID WC  . furosemide  40 mg Intravenous Q12H  . guaiFENesin  600 mg Oral BID  . levothyroxine  125 mcg Oral Q0600  . metoprolol tartrate  12.5 mg Oral BID  . pantoprazole  40 mg Oral Daily  . pravastatin  80 mg Oral q AM  . senna  1 tablet Oral BID  . sodium chloride flush  3 mL Intravenous Q12H   Continuous Infusions: . sodium chloride         LOS: 6 days   Charlynne Cousins  [facility]  01/28/2021, 7:37 AM

## 2021-01-29 DIAGNOSIS — J90 Pleural effusion, not elsewhere classified: Secondary | ICD-10-CM

## 2021-01-29 LAB — BRAIN NATRIURETIC PEPTIDE: B Natriuretic Peptide: 358.2 pg/mL — ABNORMAL HIGH (ref 0.0–100.0)

## 2021-01-29 LAB — BASIC METABOLIC PANEL
Anion gap: 10 (ref 5–15)
BUN: 19 mg/dL (ref 8–23)
CO2: 34 mmol/L — ABNORMAL HIGH (ref 22–32)
Calcium: 8.2 mg/dL — ABNORMAL LOW (ref 8.9–10.3)
Chloride: 92 mmol/L — ABNORMAL LOW (ref 98–111)
Creatinine, Ser: 1.3 mg/dL — ABNORMAL HIGH (ref 0.44–1.00)
GFR, Estimated: 43 mL/min — ABNORMAL LOW (ref >60)
Glucose, Bld: 104 mg/dL — ABNORMAL HIGH (ref 70–99)
Potassium: 3.8 mmol/L (ref 3.5–5.1)
Sodium: 136 mmol/L (ref 135–145)

## 2021-01-29 LAB — DIGOXIN LEVEL: Digoxin Level: 1.5 ng/mL (ref 0.8–2.0)

## 2021-01-29 LAB — PH, BODY FLUID: pH, Body Fluid: 7.5

## 2021-01-29 LAB — PATHOLOGIST SMEAR REVIEW

## 2021-01-29 MED ORDER — FUROSEMIDE 40 MG PO TABS
40.0000 mg | ORAL_TABLET | Freq: Every day | ORAL | Status: DC
  Administered 2021-01-29 – 2021-01-30 (×2): 40 mg via ORAL
  Filled 2021-01-29 (×2): qty 1

## 2021-01-29 MED ORDER — METOPROLOL TARTRATE 25 MG PO TABS
25.0000 mg | ORAL_TABLET | Freq: Two times a day (BID) | ORAL | Status: DC
  Administered 2021-01-29 – 2021-01-30 (×3): 25 mg via ORAL
  Filled 2021-01-29 (×3): qty 1

## 2021-01-29 MED ORDER — SPIRONOLACTONE 25 MG PO TABS
25.0000 mg | ORAL_TABLET | Freq: Every day | ORAL | Status: DC
  Administered 2021-01-29 – 2021-01-30 (×2): 25 mg via ORAL
  Filled 2021-01-29 (×2): qty 1

## 2021-01-29 MED ORDER — APIXABAN 5 MG PO TABS
5.0000 mg | ORAL_TABLET | Freq: Two times a day (BID) | ORAL | Status: DC
  Administered 2021-01-29 – 2021-01-30 (×3): 5 mg via ORAL
  Filled 2021-01-29 (×2): qty 1

## 2021-01-29 MED ORDER — AMIODARONE HCL 200 MG PO TABS
100.0000 mg | ORAL_TABLET | Freq: Every day | ORAL | Status: DC
  Administered 2021-01-29 – 2021-01-30 (×2): 100 mg via ORAL
  Filled 2021-01-29 (×2): qty 1

## 2021-01-29 NOTE — Progress Notes (Signed)
[place] for apixaban Indication: atrial fibrillation, RV and RA thrombus  Allergies  Allergen Reactions  . Atorvastatin Other (See Comments)    Aches     Patient Measurements: Height: 5\' 4"  (162.6 cm) Weight: 113.5 kg (250 lb 3.6 oz) IBW/kg (Calculated) : 54.7  Vital Signs: Temp: 98 F (36.7 C) (02/28 0435) Temp Source: Oral (02/28 0435) BP: 113/65 (02/28 0435) Pulse Rate: 89 (02/28 0435)  Labs: Recent Labs    01/28/21 0731 01/28/21 0858 01/29/21 0109  HGB  --  11.8*  --   HCT  --  39.5  --   PLT  --  473*  --   CREATININE 1.37*  --  1.30*    Estimated Creatinine Clearance: 46.2 mL/min (A) (by C-G formula based on SCr of 1.3 mg/dL (H)).   Medical History: Past Medical History:  Diagnosis Date  . Arthritis   . Atrial fibrillation ([place])   . CHF (congestive heart failure) ([place])   . CKD (chronic kidney disease), stage III ([place])   . Colon polyps   . Dysrhythmia    new onset afib   . Goiter   . Hyperglycemia   . Hyperlipidemia   . Hypertension   . Hypothyroidism   . Morbid obesity ([place])   . Osteopenia     Medications:  Scheduled:  . amiodarone  200 mg Oral Daily  . apixaban  10 mg Oral BID   Followed by  . [START ON 02/03/2021] apixaban  5 mg Oral BID  . digoxin  0.125 mg Oral Daily  . diltiazem  30 mg Oral Q8H  . ferrous sulfate  325 mg Oral BID WC  . furosemide  40 mg Intravenous Q12H  . guaiFENesin  600 mg Oral BID  . levothyroxine  125 mcg Oral Q0600  . metoprolol tartrate  12.5 mg Oral BID  . pantoprazole  40 mg Oral Daily  . pravastatin  80 mg Oral q AM  . senna  1 tablet Oral BID  . sodium chloride flush  3 mL Intravenous Q12H    Assessment: 74yoF presented with shortness of breath and low O2 sats, found to have a large pericardial effusion. PMH s/f atrial fibrillation on apixaban. Apixaban was held for pericardiocentesis on 2/23. Of note, ECHO completed on 2/22 reveals RV and RA thrombus. Pharmacy has  been consulted to restart apixaban   With afib and RA/RV thrombus, data on optimal dosing of apixaban is limited but existing data supports apixaban 5mg  po bid (no load needed).   Goal of Therapy:  Monitor platelets by anticoagulation protocol: Yes   Plan:  -Change apixaban to 5mg  po bid -Will follow patient progress  Hildred Laser, PharmD Clinical Pharmacist **Pharmacist phone directory can now be found on [email or web address] ([email or web address]).  Listed under [place].

## 2021-01-29 NOTE — Progress Notes (Signed)
Occupational Therapy Treatment Patient Details Name: [name] MRN: [id number] DOB: May 21, 1946 Today's Date: 01/29/2021    History of present illness [name] is a 75 y.o. female with medical history significant of hypertension, persistent atrial fibrillation, obesity, and diastolic CHF who presented with worsening dyspenea, SpO2 84% on RA at cardiology appointment.   OT comments  Pt making steady progress towards OT goals this session. Pt with improvements in activity tolerance and overall functional endurance with pt able to maintain sats >91% on RA this session. Pt presents with drop in BP during mobility  ( see vitals below) however pt does not endorse any dizziness. Pt currently requires min guard assist for household distance functional mobility ~ 51ft with RW. Pt currently requires min A for UB ADLs and min guard for simulated toilet transfer. Pt would continue to benefit from skilled occupational therapy while admitted and after d/c to address the below listed limitations in order to improve overall functional mobility and facilitate independence with BADL participation. DC plan remains appropriate, will follow acutely per POC.   BP from supine 116/74 EOB 109/61 Chair 92/55 chair with legs elevated 93/50 sitting in chair with legs elevated ~ 4 mins 104/88.  Follow Up Recommendations  Home health OT;Supervision/Assistance - 24 hour    Equipment Recommendations  Tub/shower seat    Recommendations for Other Services      Precautions / Restrictions Precautions Precautions: Fall Precaution Comments: Monitor O2, BP Restrictions Weight Bearing Restrictions: No       Mobility Bed Mobility Overal bed mobility: Needs Assistance Bed Mobility: Supine to Sit     Supine to sit: Min guard;HOB elevated     General bed mobility comments: minguard for steadying assist with HOb elevated as pt reports mechanical bed at home, increased time and effort noted     Transfers Overall transfer level: Needs assistance Equipment used: Rolling walker (2 wheeled) Transfers: Sit to/from Stand Sit to Stand: Min guard;From elevated surface         General transfer comment: minguard for initial steadying assist    Balance Overall balance assessment: Needs assistance Sitting-balance support: Feet supported;No upper extremity supported Sitting balance-Leahy Scale: Fair Sitting balance - Comments: sitting EOB with no UE support or LOB   Standing balance support: Bilateral upper extremity supported Standing balance-Leahy Scale: Poor Standing balance comment: reliant on BUE support during mobility                           ADL either performed or assessed with clinical judgement   ADL Overall ADL's : Needs assistance/impaired Eating/Feeding: Independent;Sitting               Upper Body Dressing : Minimal assistance;Sitting Upper Body Dressing Details (indicate cue type and reason): to don posterior gown     Toilet Transfer: Min Marine scientist Details (indicate cue type and reason): simulated via functional mobility with RW ~ 20 ft         Functional mobility during ADLs: Min guard;Rolling walker General ADL Comments: pt with improvements in activity tolerance, able to maintain sats >91% on RA this session     Vision       Perception     Praxis      Cognition Arousal/Alertness: Awake/alert Behavior During Therapy: WFL for tasks assessed/performed Overall Cognitive Status: Within Functional Limits for tasks assessed  General Comments: overall WFL for mobility tasks        Exercises     Shoulder Instructions       General Comments pt on RA during session with sats >91%. BP from supine 116/74, EOB 109/61, Chair 92/55, chair with legs elevated 93/50, sitting in chair with legs elevated ~ 4 mins 104/88. pts husband present at end of session     Pertinent Vitals/ Pain       Pain Assessment: No/denies pain  Home Living                                          Prior Functioning/Environment              Frequency  Min 2X/week        Progress Toward Goals  OT Goals(current goals can now be found in the care plan section)  Progress towards OT goals: Progressing toward goals  Acute Rehab OT Goals Patient Stated Goal: wants to go home OT Goal Formulation: With patient Time For Goal Achievement: 02/06/21 Potential to Achieve Goals: Good  Plan Discharge plan remains appropriate;Frequency remains appropriate    Co-evaluation                 AM-PAC OT "6 Clicks" Daily Activity     Outcome Measure   Help from another person eating meals?: None Help from another person taking care of personal grooming?: A Little Help from another person toileting, which includes using toliet, bedpan, or urinal?: A Little Help from another person bathing (including washing, rinsing, drying)?: A Little Help from another person to put on and taking off regular upper body clothing?: None Help from another person to put on and taking off regular lower body clothing?: A Little 6 Click Score: 20    End of Session Equipment Utilized During Treatment: Gait belt;Rolling walker  OT Visit Diagnosis: Unsteadiness on feet (R26.81);Other abnormalities of gait and mobility (R26.89);Other (comment);Other symptoms and signs involving cognitive function   Activity Tolerance Patient tolerated treatment well   Patient Left in chair;with call bell/phone within reach;with family/visitor present   Nurse Communication Mobility status;Other (comment) (slight drop in BP)        Time: 5993-5701 OT Time Calculation (min): 25 min  Charges: OT General Charges $OT Visit: 1 Visit OT Treatments $Self Care/Home Management : 23-37 mins  Harley Alto., COTA/L Acute Rehabilitation Services [place] 01/29/2021, 10:50 AM

## 2021-01-29 NOTE — Progress Notes (Signed)
Mobility Specialist: Progress Note   01/29/21 1344  Mobility  Activity Ambulated in room  Level of Assistance Minimal assist, patient does 75% or more  Assistive Device Front wheel walker  Distance Ambulated (ft) 64 ft  Mobility Response Tolerated well  Mobility performed by Mobility specialist  Bed Position Chair  $Mobility charge 1 Mobility   Pre-Mobility: 99 HR, 114/72 BP, 95% SpO2 Post-Mobility: 100 HR, 108/72 BP, 95% SpO2  Pt visibly SOB after ambulation. Pt coached through purse lipped breathing. Pt back to chair after walk. Encouraged pt to continue working with staff.   [facility] [name] Mobility Specialist Mobility Specialist Phone: [phone]

## 2021-01-29 NOTE — Progress Notes (Addendum)
Subjective:  Breathing improved  Objective:  Vital Signs in the last 24 hours: Temp:  [98 F (36.7 C)-98.1 F (36.7 C)] 98.1 F (36.7 C) (02/28 0807) Pulse Rate:  [67-106] 72 (02/28 0807) Resp:  [16-20] 18 (02/28 0807) BP: (101-134)/(52-88) 111/68 (02/28 0807) SpO2:  [91 %-98 %] 97 % (02/28 0807) Weight:  [113.5 kg] 113.5 kg (02/28 0600)  Intake/Output from previous day: 02/27 0701 - 02/28 0700 In: 483 [P.O.:480; I.V.:3] Out: 1500 [Urine:1500]  Physical Exam Vitals and nursing note reviewed.  Constitutional:      General: She is not in acute distress.    Appearance: She is well-developed.  HENT:     Head: Normocephalic and atraumatic.  Eyes:     Conjunctiva/sclera: Conjunctivae normal.     Pupils: Pupils are equal, round, and reactive to light.  Neck:     Vascular: No JVD.  Cardiovascular:     Rate and Rhythm: Normal rate. Rhythm irregularly irregular.     Pulses: Intact distal pulses.          Dorsalis pedis pulses are 1+ on the right side and 1+ on the left side.       Posterior tibial pulses are 0 on the right side and 0 on the left side.     Heart sounds: Normal heart sounds. No murmur heard.   Pulmonary:     Effort: Pulmonary effort is normal.     Breath sounds: Examination of the right-lower field reveals decreased breath sounds. Examination of the left-lower field reveals decreased breath sounds. Decreased breath sounds present. No wheezing or rales.  Abdominal:     General: Bowel sounds are normal.     Palpations: Abdomen is soft.     Tenderness: There is no rebound.  Musculoskeletal:        General: No tenderness. Normal range of motion.     Right lower leg: No edema.  Lymphadenopathy:     Cervical: No cervical adenopathy.  Skin:    General: Skin is warm and dry.  Neurological:     Mental Status: She is alert and oriented to person, place, and time.     Cranial Nerves: No cranial nerve deficit.      Lab Results: BMP Recent Labs     01/26/21 0048 01/28/21 0731 01/29/21 0109  NA 137 138 136  K 4.9 4.0 3.8  CL 95* 92* 92*  CO2 32 34* 34*  GLUCOSE 94 90 104*  BUN 46* 20 19  CREATININE 1.85* 1.37* 1.30*  CALCIUM 8.5* 8.6* 8.2*  GFRNONAA 28* 40* 43*    CBC Recent Labs  Lab 01/28/21 0858  WBC 14.1*  RBC 5.50*  HGB 11.8*  HCT 39.5  PLT 473*  MCV 71.8*  MCH 21.5*  MCHC 29.9*  RDW 19.7*  LYMPHSABS 0.4*  MONOABS 1.2*  EOSABS 0.1  BASOSABS 0.1    HEMOGLOBIN A1C No results found for: HGBA1C, MPG  Cardiac Panel (last 3 results) No results for input(s): CKTOTAL, CKMB, TROPONINI, RELINDX in the last 8760 hours.  BNP (last 3 results) Recent Labs    01/27/21 0144 01/28/21 0731 01/29/21 0109  BNP 541.4* 635.2* 358.2*    TSH Recent Labs    01/23/21 0110  TSH 1.499    Hepatic Function Panel Recent Labs    01/23/21 0110  PROT 6.2*  ALBUMIN 3.1*  AST 65*  ALT 19  ALKPHOS 52  BILITOT 1.0    EKG 01/22/2021: Afib with controlled ventricular rate  Pericardiocentesis 01/24/2021: Successful  ultrasound and radiographic and EKG assisted pericardiocentesis with successful aspiration of hemorrhagic 500 mL of fluid. Fluid sent for Gram stain and cytology.   Echocardiogram 01/23/2021: 1. Left ventricular ejection fraction, by estimation, is 50 to 55%. The  left ventricle has low normal function. The left ventricle has no regional  wall motion abnormalities. Left ventricular diastolic parameters are  indeterminate. There is Respiratory  bounce concerning for tamponade physiology.  2. Right ventricular systolic function is normal. The right ventricular  size is normal. There is normal pulmonary artery systolic pressure.  3. Left atrial size was mildly dilated.  4. Right atrial size was mildly dilated.  5. The mitral valve is grossly normal. Mild mitral valve regurgitation.  6. Aortic valve is probably trileaflet with moderate calcification.  Inadequate Doppler evaluation to evaluate  aortic stenosis. . The aortic  valve is calcified. Aortic valve regurgitation is not visualized.  7. The inferior vena cava is dilated in size with <50% respiratory  variability, suggesting right atrial pressure of 15 mmHg.  8. Moderate to large circumferential pericardial effusion. Largest  dimension measures 2.5 cm anterior to RV in subcostal window. Mitral and  tricuspid inflow respiratory variation, dilated IVC, and interventricular  septal boucne concerning for tamponade  physiology. Clinical correlation recommended. . Moderate pericardial  effusion.  9. Compared to previous outpatient study in 2018, pericardial effusion is  new.   Assessment & Recommendations:   75 year old Caucasian female with hypertension, persistent atrial fibrillation, moderate obesity, HFpEF, pericardial effusion  Pericardial effusion: Early tamponade on echo 01/23/2021, now s/p 500 cc hemorrhagic pericardiocentesis Cytology negative for malignancy S/p 850 cc thoracentesis 2/25 Suspect both were transudative effusions 2/2 heart failure Will need repeat echocardiogram outpatient in 2 weeks  HFpEF: Volume status improved. Switch IV lasix 40 mg bid to PO spironolactone 25 mg and PO lasix 40 mg daily  Afib: Rate controlled. Stop diltiazem. Increase metoprolol tartrate to 25 mg bid Reduce amiodarone to 100 mg daily. Will likely stop outpatient Continue digoxin 0.125 mg daily. Check digoxin level CHA2DS2ASc score 4, annual stroke risk 5% Continue eliquis 5 mg bid  RA/RV thrombus: Seen on the free wall, likely residual from pericardial effusion.  Not cardioembolic risk. Will repeat f/u echo in 2 weeks  Recommend ambulation, PT evaluation. From cardiac standpoint, okay to discharge today    Nigel Mormon, MD Pager: [phone] Office: [phone]

## 2021-01-29 NOTE — Care Management Important Message (Signed)
Important Message  Patient Details  Name: KARESHA TRZCINSKI MRN: [id number] Date of Birth: [birth year]   Medicare Important Message Given:  Yes     Shelda Altes 01/29/2021, 1:17 PM

## 2021-01-29 NOTE — Progress Notes (Signed)
[facility] PROGRESS NOTE    Progress Note  [name]  LKG:[id number] DOB: [birth year] DOA: 01/22/2021 PCP: Shon Baton, MD     Brief Narrative:   [name] is an 75 y.o. female past medical history of essential hypertension, chronic diastolic heart failure, persistent atrial fibrillation and obesity comes into the hospital with severe shortness of breath was seen by her cardiologist on 01/22/2019 and found to be hypoxic 83 sent to the ED and found to be in acute congestive heart failure possible left lower lobe pneumonia for which she has completed her antibiotic treatment.  Cardiology was consulted and she was started on diuretics Lasix and Aldactone.  2D echo was done that showed pericardial effusion with early signs of tamponade pericardiocentesis was done there was hemorrhagic but cytology is negative for malignancies.  As her dyspnea would not improving and she was diuresing a chest x-ray showed left pleural effusion for which she had a thoracocentesis revealed 800 cc of the transudate of fluid nonhemorrhagic and her breathing started to improve.   Significant studies: 01/22/2021 chest x-ray showed cardiomegaly small right pleural effusion and moderate left with possible pneumonia 01/25/2021 chest x-ray showed persistent consolidation of the left lower lobe Limited 2D echo on 01/25/2021 showed a preserved EF with a right ventricular and atrial thrombus, anterior right ventricular fusion.  Antibiotics: None  Microbiology data: Blood culture: Negative till date  Procedures: 01/24/2021 pericardiocentesis and yielded 500 cc of bloody fluid.   01/24/2019 cytology of pericardial fluid yielded no malignant cells 01/26/2021 thoracocentesis did yield and 850 mL of straw-colored fluid  Assessment/Plan:   Acute respiratory failure with hypoxia due to left lower lobe pneumonia possibly acute diastolic heart failure and right-sided heart failure: Cardiology is on board appreciate  assistance. Recommend to change diuretics to oral Aldactone and Lasix once a day. Her creatinine has returned to baseline with IV diuresis. Is negative about 6 L total. Continue oral digoxin, amiodarone diltiazem and low-dose metoprolol her heart rate has been less than 100. Her blood pressure seems to be tolerating diuresis well.   She has been weaned to room air and her sats have been greater than 92%. Awaiting physical therapy evaluation.  Moderate to large pericardial effusion: 2D echo done on admission revealed a large pericardial effusion. She is status post pericardiocentesis on 01/24/2021 which yielded 500 cc of hemorrhagic fluid. Repeated 2D echo showed an anterior right ventricular effusion surrounding the atrial and apex.  Transudate left pleural effusion: Seen on chest x-ray, as this is definitely asymptomatic. IR was consulted for thoracocentesis, yield 850 cc of transudate of fluid, nonbloody. Wean to room air.  Orthostatic hypotension Improved hypotension, cardiology recommended to stop antihypertensive medication and continue IV diuresis.  Essential hypertension Currently on metoprolol diltiazem and Lasix.  Blood pressure has been well controlled.  Persistent atrial fibrillation: Continue metoprolol, amiodarone, digoxin and diltiazem. Her heart rate seems to be tolerating it well is less than 100. She has a CHA2DS2-VASc score greater than 5. Continue oral anticoagulation with Eliquis. Hemoglobin continues to improve she has been weaned to room air.  Hypothyroidism: Continue Synthroid.  Microcytic anemia: Due to iron deficiency anemia continue oral iron.  Acute on chronic kidney disease stage IIIa: With a baseline of around 1.3-1.5, now 1.8 we will continue to monitor closely. Now on oral Lasix and Aldactone his creatinine has returned to baseline.  Prolonged QTC: Noted.  Mild leukocytosis: T-max of 8.1, she has remained afebrile. Review of CBC with  differential tomorrow  morning.  Her hemoglobin is improving.   DVT prophylaxis: Eliquis Family Communication:daughter Status is: Inpatient  Remains inpatient appropriate because:Hemodynamically unstable   Dispo: The patient is from: Home              Anticipated d/c is to: Home              Patient currently is not medically stable to d/c.   Difficult to place patient No        Code Status:     Code Status Orders  (From admission, onward)         Start     Ordered   01/22/21 2348  Full code  Continuous        01/22/21 2347        Code Status History    Date Active Date Inactive Code Status Order ID Comments User Context   05/29/2017 1726 05/31/2017 1806 Full Code [id number]  Leandrew Koyanagi, MD Inpatient   Advance Care Planning Activity        IV Access:    Peripheral IV   Procedures and diagnostic studies:   No results found.   Medical Consultants:    None.   Subjective:    [name] relates her appetite is improving nicely, relates her breathing is back to baseline.  Objective:    Vitals:   01/29/21 0039 01/29/21 0435 01/29/21 0600 01/29/21 0807  BP: (!) 115/52 113/65  111/68  Pulse: 67 89  72  Resp: 19 17  18   Temp: 98.1 F (36.7 C) 98 F (36.7 C)  98.1 F (36.7 C)  TempSrc: Oral Oral  Oral  SpO2: 98% 97%  97%  Weight:   113.5 kg   Height:       SpO2: 97 % O2 Flow Rate (L/min): 1 L/min   Intake/Output Summary (Last 24 hours) at 01/29/2021 1016 Last data filed at 01/29/2021 0400 Gross per 24 hour  Intake 240 ml  Output 1500 ml  Net -1260 ml   Filed Weights   01/26/21 0420 01/27/21 0600 01/29/21 0600  Weight: 119.3 kg 116.1 kg 113.5 kg    Exam: General exam: In no acute distress. Respiratory system: Good air movement and clear to auscultation. Cardiovascular system: S1 & S2 heard, RRR. No JVD. Gastrointestinal system: Abdomen is nondistended, soft and nontender.  Extremities: No pedal edema. Skin: No rashes,  lesions or ulcers Psychiatry: Judgement and insight appear normal. Mood & affect appropriate.  Data Reviewed:    Labs: Basic Metabolic Panel: Recent Labs  Lab 01/23/21 0110 01/23/21 1914 01/24/21 0121 01/25/21 0757 01/26/21 0048 01/27/21 0144 01/28/21 0731 01/29/21 0109  NA 134*  --  133* 136 137  --  138 136  K 6.0*   < > 4.8 4.2 4.9  --  4.0 3.8  CL 98  --  94* 96* 95*  --  92* 92*  CO2 20*  --  25 30 32  --  34* 34*  GLUCOSE 101*  --  98 83 94  --  90 104*  BUN 50*  --  49* 46* 46*  --  20 19  CREATININE 1.87*  --  1.87* 1.84* 1.85*  --  1.37* 1.30*  CALCIUM 8.4*  --  7.9* 8.5* 8.5*  --  8.6* 8.2*  MG 2.1  --   --   --   --  1.9  --   --   PHOS 5.0*  --   --   --   --   --   --   --    < > =  values in this interval not displayed.   GFR Estimated Creatinine Clearance: 46.2 mL/min (A) (by C-G formula based on SCr of 1.3 mg/dL (H)). Liver Function Tests: Recent Labs  Lab 01/23/21 0110  AST 65*  ALT 19  ALKPHOS 52  BILITOT 1.0  PROT 6.2*  ALBUMIN 3.1*   No results for input(s): LIPASE, AMYLASE in the last 168 hours. No results for input(s): AMMONIA in the last 168 hours. Coagulation profile No results for input(s): INR, PROTIME in the last 168 hours. COVID-19 Labs  No results for input(s): DDIMER, FERRITIN, LDH, CRP in the last 72 hours.  Lab Results  Component Value Date   SARSCOV2NAA NEGATIVE 01/22/2021   SARSCOV2NAA RESULT:  NEGATIVE 12/27/2019    CBC: Recent Labs  Lab 01/22/21 1445 01/23/21 0110 01/24/21 0121 01/25/21 0757 01/28/21 0858  WBC 13.0* 12.4* 13.7* 9.7 14.1*  NEUTROABS  --  10.3*  --   --  12.3*  HGB 10.9* 11.1* 10.4* 10.4* 11.8*  HCT 38.8 36.9 35.4* 37.0 39.5  MCV 73.6* 72.6* 72.7* 74.6* 71.8*  PLT 465* 438* 417* 402* 473*   Cardiac Enzymes: No results for input(s): CKTOTAL, CKMB, CKMBINDEX, TROPONINI in the last 168 hours. BNP (last 3 results) No results for input(s): PROBNP in the last 8760 hours. CBG: No results for  input(s): GLUCAP in the last 168 hours. D-Dimer: No results for input(s): DDIMER in the last 72 hours. Hgb A1c: No results for input(s): HGBA1C in the last 72 hours. Lipid Profile: No results for input(s): CHOL, HDL, LDLCALC, TRIG, CHOLHDL, LDLDIRECT in the last 72 hours. Thyroid function studies: No results for input(s): TSH, T4TOTAL, T3FREE, THYROIDAB in the last 72 hours.  Invalid input(s): FREET3 Anemia work up: No results for input(s): VITAMINB12, FOLATE, FERRITIN, TIBC, IRON, RETICCTPCT in the last 72 hours. Sepsis Labs: Recent Labs  Lab 01/22/21 1605 01/23/21 0110 01/23/21 0442 01/24/21 0121 01/24/21 0324 01/25/21 0757 01/28/21 0858  PROCALCITON  --   --  0.14  --  <0.10  --   --   WBC  --  12.4*  --  13.7*  --  9.7 14.1*  LATICACIDVEN 1.0  --   --   --   --   --   --    Microbiology Recent Results (from the past 240 hour(s))  SARS CORONAVIRUS 2 (TAT 6-24 HRS) Nasopharyngeal Nasopharyngeal Swab     Status: None   Collection Time: 01/22/21  4:12 PM   Specimen: Nasopharyngeal Swab  Result Value Ref Range Status   SARS Coronavirus 2 NEGATIVE NEGATIVE Final    Comment: (NOTE) SARS-CoV-2 target nucleic acids are NOT DETECTED.  The SARS-CoV-2 RNA is generally detectable in upper and lower respiratory specimens during the acute phase of infection. Negative results do not preclude SARS-CoV-2 infection, do not rule out co-infections with other pathogens, and should not be used as the sole basis for treatment or other patient management decisions. Negative results must be combined with clinical observations, patient history, and epidemiological information. The expected result is Negative.  Fact Sheet for Patients: SugarRoll.be  Fact Sheet for Healthcare Providers: https://www.woods-mathews.com/  This test is not yet approved or cleared by the [place] FDA and  has been authorized for detection and/or diagnosis of  SARS-CoV-2 by FDA under an Emergency Use Authorization (EUA). This EUA will remain  in effect (meaning this test can be used) for the duration of the COVID-19 declaration under Se ction 564(b)(1) of the Act, 21 U.S.C. section 360bbb-3(b)(1), unless the authorization is  terminated or revoked sooner.  Performed at [facility], [place] [address] [place], [place] [address]   Body fluid culture w Gram Stain     Status: None   Collection Time: 01/24/21  9:28 AM   Specimen: PATH Cytology Misc. fluid; Body Fluid  Result Value Ref Range Status   Specimen Description FLUID  Final   Special Requests NONE  Final   Gram Stain   Final    FEW WBC PRESENT, PREDOMINANTLY PMN NO ORGANISMS SEEN    Culture   Final    NO GROWTH 3 DAYS Performed at [facility], [address], [place], [place] [address]    Report Status 01/27/2021 FINAL  Final  Culture, body fluid w Gram Stain-bottle     Status: None (Preliminary result)   Collection Time: 01/26/21 10:55 AM   Specimen: Pleura  Result Value Ref Range Status   Specimen Description PLEURAL FLUID LUNG LEFT  Final   Special Requests LUNG LEFT  Final   Culture   Final    NO GROWTH 2 DAYS Performed at [facility], [place] [address] [place], [place] [address]    Report Status PENDING  Incomplete  Gram stain     Status: None   Collection Time: 01/26/21 10:55 AM   Specimen: Pleura  Result Value Ref Range Status   Specimen Description PLEURAL FLUID LUNG LEFT  Final   Special Requests LUNG LEFT  Final   Gram Stain   Final    ABUNDANT WBC PRESENT,BOTH PMN AND MONONUCLEAR NO ORGANISMS SEEN Performed at [facility], [address] [place], [place] [address]    Report Status 01/26/2021 FINAL  Final     Medications:   . amiodarone  100 mg Oral Daily  . apixaban  5 mg Oral BID  . digoxin  0.125 mg Oral Daily  . ferrous sulfate  325 mg Oral BID WC  . furosemide  40 mg Oral Daily  . guaiFENesin  600 mg Oral BID  .  levothyroxine  125 mcg Oral Q0600  . metoprolol tartrate  25 mg Oral BID  . pantoprazole  40 mg Oral Daily  . pravastatin  80 mg Oral q AM  . senna  1 tablet Oral BID  . sodium chloride flush  3 mL Intravenous Q12H  . spironolactone  25 mg Oral Daily   Continuous Infusions: . sodium chloride        LOS: 7 days   Charlynne Cousins  [facility]  01/29/2021, 10:16 AM

## 2021-01-30 ENCOUNTER — Other Ambulatory Visit: Payer: Self-pay | Admitting: Cardiology

## 2021-01-30 DIAGNOSIS — R600 Localized edema: Secondary | ICD-10-CM

## 2021-01-30 LAB — CBC WITH DIFFERENTIAL/PLATELET
Abs Immature Granulocytes: 0.03 10*3/uL (ref 0.00–0.07)
Basophils Absolute: 0.1 10*3/uL (ref 0.0–0.1)
Basophils Relative: 1 %
Eosinophils Absolute: 0.2 10*3/uL (ref 0.0–0.5)
Eosinophils Relative: 2 %
HCT: 35.9 % — ABNORMAL LOW (ref 36.0–46.0)
Hemoglobin: 10.8 g/dL — ABNORMAL LOW (ref 12.0–15.0)
Immature Granulocytes: 0 %
Lymphocytes Relative: 6 %
Lymphs Abs: 0.6 10*3/uL — ABNORMAL LOW (ref 0.7–4.0)
MCH: 21.6 pg — ABNORMAL LOW (ref 26.0–34.0)
MCHC: 30.1 g/dL (ref 30.0–36.0)
MCV: 71.7 fL — ABNORMAL LOW (ref 80.0–100.0)
Monocytes Absolute: 0.8 10*3/uL (ref 0.1–1.0)
Monocytes Relative: 8 %
Neutro Abs: 8.1 10*3/uL — ABNORMAL HIGH (ref 1.7–7.7)
Neutrophils Relative %: 83 %
Platelets: 356 10*3/uL (ref 150–400)
RBC: 5.01 MIL/uL (ref 3.87–5.11)
RDW: 19.6 % — ABNORMAL HIGH (ref 11.5–15.5)
WBC: 9.8 10*3/uL (ref 4.0–10.5)
nRBC: 0 % (ref 0.0–0.2)

## 2021-01-30 LAB — BRAIN NATRIURETIC PEPTIDE: B Natriuretic Peptide: 429.7 pg/mL — ABNORMAL HIGH (ref 0.0–100.0)

## 2021-01-30 MED ORDER — METOPROLOL TARTRATE 25 MG PO TABS: 25.0000 mg | ORAL_TABLET | Freq: Two times a day (BID) | ORAL | 3 refills | Status: DC

## 2021-01-30 MED ORDER — FUROSEMIDE 40 MG PO TABS: 40.0000 mg | ORAL_TABLET | Freq: Every day | ORAL | 0 refills | Status: DC

## 2021-01-30 MED ORDER — DIGOXIN 125 MCG PO TABS: 0.1250 mg | ORAL_TABLET | Freq: Every day | ORAL | 0 refills | Status: DC

## 2021-01-30 MED ORDER — AMIODARONE HCL 100 MG PO TABS: 100.0000 mg | ORAL_TABLET | Freq: Every day | ORAL | 3 refills | Status: DC

## 2021-01-30 NOTE — Plan of Care (Signed)
Problem: Education: Goal: Knowledge of General Education information will improve Description: Including pain rating scale, medication(s)/side effects and non-pharmacologic comfort measures 01/30/2021 1121 by Lurline Idol, RN Outcome: Adequate for Discharge 01/30/2021 1121 by Lurline Idol, RN Outcome: Adequate for Discharge   Problem: Health Behavior/Discharge Planning: Goal: Ability to manage health-related needs will improve 01/30/2021 1121 by Lurline Idol, RN Outcome: Adequate for Discharge 01/30/2021 1121 by Lurline Idol, RN Outcome: Adequate for Discharge   Problem: Clinical Measurements: Goal: Ability to maintain clinical measurements within normal limits will improve 01/30/2021 1121 by Lurline Idol, RN Outcome: Adequate for Discharge 01/30/2021 1121 by Lurline Idol, RN Outcome: Adequate for Discharge Goal: Will remain free from infection 01/30/2021 1121 by Lurline Idol, RN Outcome: Adequate for Discharge 01/30/2021 1121 by Lurline Idol, RN Outcome: Adequate for Discharge Goal: Diagnostic test results will improve 01/30/2021 1121 by Lurline Idol, RN Outcome: Adequate for Discharge 01/30/2021 1121 by Lurline Idol, RN Outcome: Adequate for Discharge Goal: Respiratory complications will improve 01/30/2021 1121 by Lurline Idol, RN Outcome: Adequate for Discharge 01/30/2021 1121 by Lurline Idol, RN Outcome: Adequate for Discharge Goal: Cardiovascular complication will be avoided 01/30/2021 1121 by Lurline Idol, RN Outcome: Adequate for Discharge 01/30/2021 1121 by Lurline Idol, RN Outcome: Adequate for Discharge   Problem: Activity: Goal: Risk for activity intolerance will decrease 01/30/2021 1121 by Lurline Idol, RN Outcome: Adequate for Discharge 01/30/2021 1121 by Lurline Idol, RN Outcome: Adequate for Discharge   Problem: Nutrition: Goal: Adequate nutrition will be maintained 01/30/2021 1121 by Lurline Idol, RN Outcome: Adequate for Discharge 01/30/2021 1121  by Lurline Idol, RN Outcome: Adequate for Discharge   Problem: Coping: Goal: Level of anxiety will decrease 01/30/2021 1121 by Lurline Idol, RN Outcome: Adequate for Discharge 01/30/2021 1121 by Lurline Idol, RN Outcome: Adequate for Discharge   Problem: Elimination: Goal: Will not experience complications related to bowel motility 01/30/2021 1121 by Lurline Idol, RN Outcome: Adequate for Discharge 01/30/2021 1121 by Lurline Idol, RN Outcome: Adequate for Discharge Goal: Will not experience complications related to urinary retention 01/30/2021 1121 by Lurline Idol, RN Outcome: Adequate for Discharge 01/30/2021 1121 by Lurline Idol, RN Outcome: Adequate for Discharge   Problem: Pain Managment: Goal: General experience of comfort will improve 01/30/2021 1121 by Lurline Idol, RN Outcome: Adequate for Discharge 01/30/2021 1121 by Lurline Idol, RN Outcome: Adequate for Discharge   Problem: Safety: Goal: Ability to remain free from injury will improve 01/30/2021 1121 by Lurline Idol, RN Outcome: Adequate for Discharge 01/30/2021 1121 by Lurline Idol, RN Outcome: Adequate for Discharge   Problem: Skin Integrity: Goal: Risk for impaired skin integrity will decrease 01/30/2021 1121 by Lurline Idol, RN Outcome: Adequate for Discharge 01/30/2021 1121 by Lurline Idol, RN Outcome: Adequate for Discharge   Problem: Acute Rehab OT Goals (only OT should resolve) Goal: Pt. Will Perform Grooming Outcome: Adequate for Discharge Goal: Pt. Will Perform Lower Body Bathing Outcome: Adequate for Discharge Goal: Pt. Will Perform Lower Body Dressing Outcome: Adequate for Discharge Goal: Pt. Will Transfer To Toilet Outcome: Adequate for Discharge Goal: OT Additional ADL Goal #1 Outcome: Adequate for Discharge   Problem: Acute Rehab PT Goals(only PT should resolve) Goal: Patient Will Transfer Sit To/From Stand Outcome: Adequate for Discharge Goal: Pt Will Transfer Bed To Chair/Chair  To Bed Outcome: Adequate for Discharge Goal: Pt Will Perform Standing Balance Or  Pre-Gait Outcome: Adequate for Discharge Goal: Pt Will Ambulate Outcome: Adequate for Discharge Goal: Pt/caregiver will Perform Home Exercise Program Outcome: Adequate for Discharge   Problem: Education: Goal: Ability to demonstrate management of disease process will improve 01/30/2021 1121 by Lurline Idol, RN Outcome: Adequate for Discharge 01/30/2021 1121 by Lurline Idol, RN Outcome: Adequate for Discharge Goal: Ability to verbalize understanding of medication therapies will improve 01/30/2021 1121 by Lurline Idol, RN Outcome: Adequate for Discharge 01/30/2021 1121 by Lurline Idol, RN Outcome: Adequate for Discharge Goal: Individualized Educational Video(s) 01/30/2021 1121 by Lurline Idol, RN Outcome: Adequate for Discharge 01/30/2021 1121 by Lurline Idol, RN Outcome: Adequate for Discharge   Problem: Activity: Goal: Capacity to carry out activities will improve 01/30/2021 1121 by Lurline Idol, RN Outcome: Adequate for Discharge 01/30/2021 1121 by Lurline Idol, RN Outcome: Adequate for Discharge   Problem: Cardiac: Goal: Ability to achieve and maintain adequate cardiopulmonary perfusion will improve 01/30/2021 1121 by Lurline Idol, RN Outcome: Adequate for Discharge 01/30/2021 1121 by Lurline Idol, RN Outcome: Adequate for Discharge

## 2021-01-30 NOTE — Progress Notes (Signed)
Physical Therapy Treatment Patient Details Name: [name] MRN: [id number] DOB: 28-Nov-1946 Today's Date: 01/30/2021    History of Present Illness [name] is a 75 y.o. female with medical history significant of hypertension, persistent atrial fibrillation, obesity, and diastolic CHF who presented with worsening dyspenea, SpO2 84% on RA at cardiology appointment.    PT Comments    Pt received in chair, agreeable to therapy session and with good progress toward mobility goals this date. Pt progressed gait distance to 7ft using RW and Supervision to min guard and performed transfers with Supervision. Pt denies dizziness and pallor good during mobility, HR 84-110 bpm during mobility and VSS otherwise. Pt continues to benefit from PT services to progress toward functional mobility goals. Continue to recommend HHPT, pt will benefit for supervision with all OOB mobility once home.  Follow Up Recommendations  Home health PT;Supervision for mobility/OOB     Equipment Recommendations  Other (comment) (could benefit from shower chair)    Recommendations for Other Services       Precautions / Restrictions Precautions Precautions: Fall Precaution Comments: Monitor O2, BP Restrictions Weight Bearing Restrictions: No    Mobility  Bed Mobility               General bed mobility comments: received/remained in chair    Transfers Overall transfer level: Needs assistance Equipment used: Rolling walker (2 wheeled) Transfers: Sit to/from Stand Sit to Stand: Supervision         General transfer comment: from chair<>RW, cues for hand placement  Ambulation/Gait Ambulation/Gait assistance: Min guard Gait Distance (Feet): 70 Feet Assistive device: Rolling walker (2 wheeled) Gait Pattern/deviations: Step-to pattern;Decreased step length - right;Decreased step length - left;Trunk flexed Gait velocity: Decreased   General Gait Details: pt denies dizziness; cues for activity  pacing and proximity to RW needed at times, no LOB   Stairs             Wheelchair Mobility    Modified Rankin (Stroke Patients Only)       Balance Overall balance assessment: Needs assistance Sitting-balance support: Feet supported;No upper extremity supported Sitting balance-Leahy Scale: Fair     Standing balance support: Bilateral upper extremity supported Standing balance-Leahy Scale: Fair Standing balance comment: reliant on BUE support during mobility but able to stand and don mask with BUE unsupported no LOB and Supervision                            Cognition Arousal/Alertness: Awake/alert Behavior During Therapy: WFL for tasks assessed/performed Overall Cognitive Status: Within Functional Limits for tasks assessed                                 General Comments: overall WFL for mobility tasks      Exercises Other Exercises Other Exercises: pt has HEP handout, encouraged compliance TID until HHPT starts up    General Comments General comments (skin integrity, edema, etc.): HR 74-84 bpm resting and up to 110 bpm during gait trial; SpO2 100% on RA, RR 17 resting, BP 113/65 (82) prior to gait      Pertinent Vitals/Pain Pain Assessment: No/denies pain Pain Intervention(s): Monitored during session;Repositioned    Home Living                      Prior Function  PT Goals (current goals can now be found in the care plan section) Acute Rehab PT Goals Patient Stated Goal: wants to go home PT Goal Formulation: With patient Time For Goal Achievement: 02/07/21 Potential to Achieve Goals: Good Progress towards PT goals: Progressing toward goals    Frequency    Min 3X/week      PT Plan Current plan remains appropriate    Co-evaluation              AM-PAC PT "6 Clicks" Mobility   Outcome Measure  Help needed turning from your back to your side while in a flat bed without using bedrails?:  None Help needed moving from lying on your back to sitting on the side of a flat bed without using bedrails?: A Little Help needed moving to and from a bed to a chair (including a wheelchair)?: A Little Help needed standing up from a chair using your arms (e.g., wheelchair or bedside chair)?: A Little Help needed to walk in hospital room?: A Little Help needed climbing 3-5 steps with a railing? : A Lot 6 Click Score: 18    End of Session Equipment Utilized During Treatment: Gait belt Activity Tolerance: Patient tolerated treatment well Patient left: in chair;with family/visitor present;with call bell/phone within reach Nurse Communication: Mobility status PT Visit Diagnosis: Unsteadiness on feet (R26.81);Muscle weakness (generalized) (M62.81)     Time: 7654-6503 PT Time Calculation (min) (ACUTE ONLY): 14 min  Charges:  $Gait Training: 8-22 mins                     Gola Bribiesca P., PTA Acute Rehabilitation Services Pager: [phone] Office: [place] 01/30/2021, 11:11 AM

## 2021-01-30 NOTE — Progress Notes (Signed)
Pt stated that she would like to go home as soon as possible and have home health agent call her.   Lavenia Atlas, RN

## 2021-01-30 NOTE — Progress Notes (Signed)
Patient given discharge instructions and stated understanding.  She was given written prescriptions and will be leaving as soon as her home health is set up.

## 2021-01-30 NOTE — TOC Transition Note (Signed)
Transition of Care (TOC) - CM/SW Discharge Note Marvetta Gibbons RN, BSN Transitions of Care Unit 4E- RN Case Manager See Treatment Team for direct phone #    Patient Details  Name: DYSTANY DUFFY MRN: [id number] Date of Birth: Dec 12, 1945  Transition of Care [facility]) CM/SW Contact:  Dawayne Patricia, RN Phone Number: 01/30/2021, 2:21 PM   Clinical Narrative:    Pt stable for transition home today, noted recs for HHPT/OT- have requested orders from MD. CM spoke with pt and family at the bedside. Discussed HH and DME needs- pt has all needed DME - with exception of shower chair- explained insurance does not cover shower chairs and best to go to local store to purchase or check thrift stores. Daughter states she will f/u on this and purchase one for patient.  List provided to pt for [facility] choice- Per CMS guidelines from medicare.gov website with star ratings (copy placed in shadow chart)- after review pt states she would like to use [facility] for [facility] needs with second choice as [facility]. Address, phone # and PCP all confirmed with pt in epic.  Family to provide transport home  MD has placed orders for HHPT/OT- call made to [facility] with [facility] for [facility] referral- referral has been accepted.  Pt called post discharge to inform that [facility] had been secured with [facility].    Final next level of care: [place] Barriers to Discharge: No Barriers Identified   Patient Goals and CMS Choice Patient states their goals for this hospitalization and ongoing recovery are:: return home CMS Medicare.gov Compare Post Acute Care list provided to:: Patient Choice offered to / list presented to : [place]  Discharge Placement               Home with [facility]        Discharge Plan and Services   Discharge Planning Services: CM Consult Post Acute Care Choice: Home Health          DME Arranged: N/A DME Agency: NA       HH Arranged: PT,OT [place] Agency: [place] ([facility]) Date [place]: 01/30/21 Time HH Agency Contacted: [phone] Representative spoke with at [place]: [employer] Determinants of Health ([place]) Interventions     Readmission Risk Interventions No flowsheet data found.

## 2021-01-30 NOTE — Discharge Summary (Signed)
Physician Discharge Summary  [name] MOQ:[id number] DOB: Jul 13, 1946 DOA: 01/22/2021  PCP: Shon Baton, MD  Admit date: 01/22/2021 Discharge date: 01/30/2021  Admitted From: Home Disposition:  Home  Recommendations for Outpatient Follow-up:  1. Follow up with cardiology in 1-2 weeks, resume ACE inhibitor as an outpatient. 2. Please obtain BMP/CBC in one week 3. She will need a colonoscopy as an outpatient due to her iron deficiency anemia.  Home Health:yes Equipment/Devices:None  Discharge Condition:Stable CODE STATUS:Full Diet recommendation: Heart Healthy  Brief/Interim Summary: 75 y.o. female past medical history of essential hypertension, chronic diastolic heart failure, persistent atrial fibrillation and obesity comes into the hospital with severe shortness of breath was seen by her cardiologist on 01/22/2019 and found to be hypoxic 83 sent to the ED and found to be in acute congestive heart failure possible left lower lobe pneumonia for which she has completed her antibiotic treatment.  Cardiology was consulted and she was started on diuretics Lasix and Aldactone.  2D echo was done that showed pericardial effusion with early signs of tamponade pericardiocentesis was done there was hemorrhagic but cytology is negative for malignancies.  As her dyspnea would not improving and she was diuresing a chest x-ray showed left pleural effusion for which she had a thoracocentesis revealed 800 cc of the transudate of fluid nonhemorrhagic and her breathing started to improve.  Significant studies: 01/22/2021 chest x-ray showed cardiomegaly small right pleural effusion and moderate left with possible pneumonia 01/25/2021 chest x-ray showed persistent consolidation of the left lower lobe Limited 2D echo on 01/25/2021 showed a preserved EF with a right ventricular and atrial thrombus, anterior right ventricular fusion.  Antibiotics: None  Microbiology data: Blood culture: Negative till  date  Procedures: 01/24/2021 pericardiocentesis and yielded 500 cc of bloody fluid.   01/24/2019 cytology of pericardial fluid yielded no malignant cells 01/26/2021 thoracocentesis did yield and 850 mL of straw-colored fluid Discharge Diagnoses:  Principal Problem:   Acute respiratory failure with hypoxia ([place]) Active Problems:   Essential hypertension   Longstanding persistent atrial fibrillation (HCC)   Acute on chronic diastolic heart failure (HCC)   CHF exacerbation (HCC)   Left lower lobe pneumonia   Anemia   AKI (acute kidney injury) ([place])   CKD (chronic kidney disease), stage III (HCC)   Prolonged QT interval   Pericardial effusion   Pleural effusion Acute respiratory failure with hypoxia due to left lower lobe pneumonia and possibly acute systolic heart failure with right-sided heart failure: She was started empirically on antibiotics where she completed her treatment in house. Cardiology was consulted and recommended aggressive IV diuresis she diuresed over 16 L. She was changed to oral Lasix and Aldactone which should continue as an outpatient. They also recommended to start her on digoxin and transition her atenolol to metoprolol. Her heart rate remained well controlled. She was weaned to room air. Physical therapy evaluated the patient and recommended home health PT.  Moderate to large pericardial effusion: A 2D echo was done that revealed a large pericardial effusion. Pericardiocentesis was done on 01/24/2019 to resume 500 cc of hemorrhagic fluid with no malignant cells on cytology. I repeated 2D echo showed right ventricular effusion small around the atrium and apex. Cardiology to follow-up as an outpatient.  Transudate of left pleural effusion: Seen on chest x-ray, as it was hard to wean off oxygen and she kept complaining of shortness of breath IR was consulted to perform a thoracocentesis that yield 800 feet cc of transudative fluid nonbloody. She was  weaned to  room air.  Orthostatic hypotension: Antihypertensive medications were held as she was diuresed and after pericardiocentesis this resolved.  Essential hypertension: She will go home on metoprolol diltiazem Lasix and Aldactone. Her ACE inhibitor was held and will be resumed as an outpatient if her blood pressure and creatinine tolerated.  Persistent atrial fibrillation: With a chads Vascor greater than 5. Her medications were titrated and changed she will go home on metoprolol amiodarone diltiazem and digoxin. She will continue Eliquis as an outpatient. Her hemoglobin has remained stable.  Hypothyroidism: Continue Synthroid.  Microcytic anemia: In the setting of iron deficiency anemia, she was continued on oral iron.  She will need a colonoscopy as an outpatient.  Acute on chronic kidney disease stage IIIa: With a baseline creatinine of 1.3-1.5, on admission greater than 2 that improved with diuresis.  On the day of discharge it was 1.5. Her electrolytes were monitored and repleted as needed.  She will go home on Lasix and Aldactone.  Prolonged QTC: Her electrolytes were corrected.  He remained stable.   Discharge Instructions  Discharge Instructions    Diet - low sodium heart healthy   Complete by: As directed    Increase activity slowly   Complete by: As directed      Allergies as of 01/30/2021      Reactions   Atorvastatin Other (See Comments)   Aches      Medication List    STOP taking these medications   amLODipine 10 MG tablet Commonly known as: NORVASC   atenolol 100 MG tablet Commonly known as: TENORMIN   losartan 25 MG tablet Commonly known as: COZAAR     TAKE these medications   acetaminophen 325 MG tablet Commonly known as: TYLENOL Take 325-650 mg by mouth at bedtime as needed for mild pain.   amiodarone 100 MG tablet Commonly known as: PACERONE Take 1 tablet (100 mg total) by mouth daily.   CALTRATE 600+D3 PO Take 1 tablet by mouth every  evening.   [employer] Iron 325 (65 FE) MG tablet Generic drug: ferrous sulfate Take 325 mg by mouth daily at 6 PM.   digoxin 0.125 MG tablet Commonly known as: LANOXIN Take 1 tablet (0.125 mg total) by mouth daily.   Eliquis 5 MG Tabs tablet Generic drug: apixaban TAKE 1 TABLET BY MOUTH TWICE A DAY What changed: how much to take   furosemide 40 MG tablet Commonly known as: LASIX Take 1 tablet (40 mg total) by mouth daily.   levothyroxine 125 MCG tablet Commonly known as: SYNTHROID Take 125 mcg by mouth daily before breakfast.   magic mouthwash Soln Take 5 mLs by mouth See admin instructions. Swish 5 ml's in the mouth, then expectorate, four times a day FOR 9 DAYS   metoprolol tartrate 25 MG tablet Commonly known as: LOPRESSOR Take 1 tablet (25 mg total) by mouth 2 (two) times daily.   omeprazole 20 MG capsule Commonly known as: PRILOSEC TAKE 1 CAPSULE BY MOUTH EVERY DAY What changed:   how much to take  when to take this   pravastatin 80 MG tablet Commonly known as: PRAVACHOL Take 80 mg by mouth in the morning.   spironolactone 25 MG tablet Commonly known as: ALDACTONE Take 25 mg by mouth daily.   vitamin B-12 1000 MCG tablet Commonly known as: CYANOCOBALAMIN Take 1,000 mcg by mouth daily.       Allergies  Allergen Reactions  . Atorvastatin Other (See Comments)    Aches  Consultations:  Cardiology   Procedures/Studies: DG Chest 1 View  Result Date: 01/26/2021 CLINICAL DATA:  Post left thoracentesis. EXAM: CHEST  1 VIEW COMPARISON:  01/25/2021 FINDINGS: Improved aeration in the left lower chest. There are residual densities along the lateral aspect of the left lower chest. Negative for pneumothorax following the thoracentesis. Stable appearance of the right lung with haziness in the right lower chest. Cardiac silhouette appears to be enlarged but poorly defined. Trachea is midline. IMPRESSION: 1. Negative for pneumothorax following left thoracentesis.  2. Improved aeration in left lower chest following the thoracentesis. Electronically Signed   By: Markus Daft M.D.   On: 01/26/2021 11:49   DG Chest 1 View  Result Date: 01/25/2021 CLINICAL DATA:  Shortness of breath and cough EXAM: CHEST  1 VIEW COMPARISON:  January 22, 2021 FINDINGS: There is persistent left pleural effusion with consolidation in the left lower lung region. There is an equivocal pleural effusion on the right. There is cardiomegaly with pulmonary vascularity normal. No adenopathy. There is aortic atherosclerosis. No bone lesions. IMPRESSION: Stable cardiomegaly. Persistent airspace consolidation left lower lung region and left pleural effusion, similar to prior study. Equivocal right pleural effusion. Right lung otherwise clear. Aortic Atherosclerosis (ICD10-I70.0). Electronically Signed   By: Lowella Grip III M.D.   On: 01/25/2021 09:04   DG Chest 2 View  Result Date: 01/22/2021 CLINICAL DATA:  Shortness of breath EXAM: CHEST - 2 VIEW COMPARISON:  04/16/2017 FINDINGS: Low lung volumes. Suspected trace right pleural effusion. Airspace disease at the left lung base with probable small effusion. Enlarged cardiomediastinal silhouette. No pneumothorax. IMPRESSION: Cardiomegaly with low lung volumes, suspected small right and small moderate left pleural effusions, and left basilar consolidation which may reflect atelectasis or pneumonia. Electronically Signed   By: Donavan Foil M.D.   On: 01/22/2021 15:24   CARDIAC CATHETERIZATION  Result Date: 01/24/2021 Pericardiocentesis 01/24/2021: Successful ultrasound and radiographic and EKG assisted pericardiocentesis with successful aspiration of hemorrhagic 500 mL of fluid. Fluid sent for Gram stain and cytology. Adrian Prows, MD, [facility] 01/24/2021, 10:31 AM Office: [phone] Pager: [phone]   ECHOCARDIOGRAM COMPLETE  Result Date: 01/23/2021    ECHOCARDIOGRAM REPORT   Patient Name:   [name] Date of Exam: 01/23/2021 Medical Rec #:   [id number]      Height:       64.0 in Accession #:    [id number]     Weight:       262.1 lb Date of Birth:  08-Jun-1946       BSA:          2.195 m Patient Age:    87 years       BP:           111/64 mmHg Patient Gender: F              HR:           73 bpm. Exam Location:  Inpatient Procedure: 2D Echo Indications:     acute diastolic chf  History:         Patient has no prior history of Echocardiogram examinations.                  Chronic kidney disease, Arrythmias:Atrial Fibrillation,                  Signs/Symptoms:leg edema; Risk Factors:Hypertension.  Sonographer:     Johny Chess RDCS Referring Phys:  Geryl Rankins DOUTOVA Diagnosing Phys: Vernell Leep MD IMPRESSIONS  1.  Left ventricular ejection fraction, by estimation, is 50 to 55%. The left ventricle has low normal function. The left ventricle has no regional wall motion abnormalities. Left ventricular diastolic parameters are indeterminate. There is Respiratory bounce concerning for tamponade physiology.  2. Right ventricular systolic function is normal. The right ventricular size is normal. There is normal pulmonary artery systolic pressure.  3. Left atrial size was mildly dilated.  4. Right atrial size was mildly dilated.  5. The mitral valve is grossly normal. Mild mitral valve regurgitation.  6. Aortic valve is probably trileaflet with moderate calcification. Inadequate Doppler evaluation to evaluate aortic stenosis. . The aortic valve is calcified. Aortic valve regurgitation is not visualized.  7. The inferior vena cava is dilated in size with <50% respiratory variability, suggesting right atrial pressure of 15 mmHg.  8. Moderate to large circumferential pericardial effusion. Largest dimension measures 2.5 cm anterior to RV in subcostal window. Mitral and tricuspid inflow respiratory variation, dilated IVC, and interventricular septal boucne concerning for tamponade physiology. Clinical correlation recommended. . Moderate pericardial  effusion.  9. Compared to previous outpatient study in 2018, pericardial effusion is new. FINDINGS  Left Ventricle: Left ventricular ejection fraction, by estimation, is 50 to 55%. The left ventricle has low normal function. The left ventricle has no regional wall motion abnormalities. The left ventricular internal cavity size was normal in size. There is no left ventricular hypertrophy. Respiratory bounce concerning for tamponade physiology. Left ventricular diastolic parameters are indeterminate. Right Ventricle: The right ventricular size is normal. No increase in right ventricular wall thickness. Right ventricular systolic function is normal. There is normal pulmonary artery systolic pressure. The tricuspid regurgitant velocity is 2.47 m/s, and  with an assumed right atrial pressure of 10 mmHg, the estimated right ventricular systolic pressure is 16.0 mmHg. Left Atrium: Left atrial size was mildly dilated. Right Atrium: Right atrial size was mildly dilated. Pericardium: Moderate to large circumferential pericardial effusion. Largest dimension measures 2.5 cm anterior to RV in subcostal window. Mitral and tricuspid inflow respiratory variation, dilated IVC, and interventricular septal boucne concerning for tamponade physiology. Clinical correlation recommended. A moderately sized pericardial effusion is present. There is excessive respiratory variation in septal movement. Mitral Valve: The mitral valve is grossly normal. Mild mitral valve regurgitation. Tricuspid Valve: The tricuspid valve is grossly normal. Tricuspid valve regurgitation is mild. Aortic Valve: Aortic valve is probably trileaflet with moderate calcification. Inadequate Doppler evaluation to evaluate aortic stenosis. The aortic valve is calcified. Aortic valve regurgitation is not visualized. Pulmonic Valve: The pulmonic valve was grossly normal. Pulmonic valve regurgitation is not visualized. Aorta: The aortic root and ascending aorta are  structurally normal, with no evidence of dilitation. Venous: The inferior vena cava is dilated in size with less than 50% respiratory variability, suggesting right atrial pressure of 15 mmHg. IAS/Shunts: No atrial level shunt detected by color flow Doppler. Additional Comments: There is a small pleural effusion in the left lateral region.  LEFT VENTRICLE PLAX 2D LVIDd:         4.40 cm LVIDs:         3.00 cm LV PW:         0.90 cm LV IVS:        0.90 cm LVOT diam:     1.80 cm LVOT Area:     2.54 cm  IVC IVC diam: 2.30 cm LEFT ATRIUM             Index       RIGHT ATRIUM  Index LA diam:        4.00 cm 1.82 cm/m  RA Area:     18.00 cm LA Vol (A2C):   56.4 ml 25.70 ml/m RA Volume:   45.60 ml  20.78 ml/m LA Vol (A4C):   77.2 ml 35.18 ml/m LA Biplane Vol: 67.7 ml 30.85 ml/m   AORTA Ao Root diam: 2.70 cm Ao Asc diam:  3.10 cm TRICUSPID VALVE TR Peak grad:   24.4 mmHg TR Vmax:        247.00 cm/s  SHUNTS Systemic Diam: 1.80 cm Vernell Leep MD Electronically signed by Vernell Leep MD Signature Date/Time: 01/23/2021/8:04:28 PM    Final    ECHOCARDIOGRAM LIMITED  Result Date: 01/26/2021    ECHOCARDIOGRAM LIMITED REPORT   Patient Name:   [name] Date of Exam: 01/24/2021 Medical Rec #:  [id number]      Height:       64.0 in Accession #:    [id number]     Weight:       260.6 lb Date of Birth:  11-Aug-1946       BSA:          2.189 m Patient Age:    75 years       BP:           99/75 mmHg Patient Gender: F              HR:           91 bpm. Exam Location:  Inpatient Procedure: Limited Echo Indications:     Pericardial effusion  History:         Patient has prior history of Echocardiogram examinations, most                  recent 01/23/2021. CHF, Pericardial Disease, Arrythmias:Atrial                  Fibrillation, Signs/Symptoms:Shortness of Breath and Chest                  Pain; Risk Factors:Hypertension. CKD.  Sonographer:     Dustin Flock RDCS Referring Phys:  [id number] [facility] J PATWARDHAN  Diagnosing Phys: Adrian Prows MD IMPRESSIONS  1. US guided pericardiocentesis with aspiration of 500 cc of hemorrhagic fluid with no residual effusion. There is organized thrombus on the free wall of the RV. Limited study for pericardiocentesis.. Moderate pericardial effusion. The pericardial effusion is circumferential. FINDINGS  Pericardium: US guided pericardiocentesis with aspiration of 500 cc of hemorrhagic fluid with no residual effusion. There is organized thrombus on the free wall of the RV. Limited study for pericardiocentesis. A moderately sized pericardial effusion is present. The pericardial effusion is circumferential. The pericardial effusion appears to contain fibrous material. There is excessive respiratory variation in septal movement. Additional Comments: There is no pleural effusion. Adrian Prows MD Electronically signed by Adrian Prows MD Signature Date/Time: 01/26/2021/6:37:46 AM    Final    ECHOCARDIOGRAM LIMITED  Result Date: 01/26/2021    ECHOCARDIOGRAM LIMITED REPORT   Patient Name:   [name] Date of Exam: 01/25/2021 Medical Rec #:  [id number]      Height:       64.0 in Accession #:    [id number]     Weight:       263.0 lb Date of Birth:  January 25, 1946       BSA:          2.198 m Patient Age:    18  years       BP:           93/62 mmHg Patient Gender: F              HR:           88 bpm. Exam Location:  Inpatient Procedure: Limited Echo, Cardiac Doppler and Color Doppler Indications:     I31.3 Pericardial effusion (noninflammatory)  History:         Patient has prior history of Echocardiogram examinations, most                  recent 01/24/2021. Pericardial Disease, Abnormal ECG,                  Arrythmias:Atrial Fibrillation, Signs/Symptoms:Dyspnea and                  Shortness of Breath; Risk Factors:Hypertension. Edema.                  Pericardial effusion. Post pericardiocentesis.  Sonographer:     Roseanna Rainbow Referring Phys:  [id number] [facility] J PATWARDHAN Diagnosing Phys: Adrian Prows MD   Sonographer Comments: Patient is morbidly obese. IMPRESSIONS  1. Left ventricular ejection fraction, by estimation, is 50 to 55%. Left ventricular ejection fraction by 2D MOD biplane is 54.4 %. The left ventricle has low normal function. The left ventricle has no regional wall motion abnormalities. Left ventricular diastolic function could not be evaluated.  2. Right ventricular systolic function is normal. The right ventricular size is normal. There is normal pulmonary artery systolic pressure.  3. Left atrial size was moderately dilated.  4. Right atrial size was moderately dilated.  5. There is organized thrombus on the free wall of the right ventricle and the RA. Doppler's not performed. Limited echo.. The pericardial effusion is anterior to the right ventricle, localized near the right atrium and surrounding the apex.  6. The mitral valve is normal in structure. Mild mitral valve regurgitation. No evidence of mitral stenosis.  7. The aortic valve is normal in structure. Aortic valve regurgitation is not visualized. No aortic stenosis is present. Comparison(s): Compared to 01/23/2021, moderate exudative pericardial effusion with suggestion of tamponade not present. FINDINGS  Left Ventricle: Left ventricular ejection fraction, by estimation, is 50 to 55%. Left ventricular ejection fraction by 2D MOD biplane is 54.4 %. The left ventricle has low normal function. The left ventricle has no regional wall motion abnormalities. The left ventricular internal cavity size was small. There is no left ventricular hypertrophy. Left ventricular diastolic function could not be evaluated. Right Ventricle: The right ventricular size is normal. No increase in right ventricular wall thickness. Right ventricular systolic function is normal. There is normal pulmonary artery systolic pressure. The tricuspid regurgitant velocity is 2.22 m/s, and  with an assumed right atrial pressure of 3 mmHg, the estimated right ventricular systolic  pressure is 03.8 mmHg. Left Atrium: Left atrial size was moderately dilated. Right Atrium: Right atrial size was moderately dilated. Pericardium: There is organized thrombus on the free wall of the right ventricle and the RA. Doppler's not performed. Limited echo. The pericardial effusion is anterior to the right ventricle, localized near the right atrium and surrounding the apex. Mitral Valve: The mitral valve is normal in structure. Mild mitral valve regurgitation. No evidence of mitral valve stenosis. Tricuspid Valve: The tricuspid valve is normal in structure. Tricuspid valve regurgitation is trivial. No evidence of tricuspid stenosis. Aortic Valve: The aortic valve is normal  in structure. Aortic valve regurgitation is not visualized. No aortic stenosis is present. Pulmonic Valve: The pulmonic valve was normal in structure. Pulmonic valve regurgitation is not visualized. No evidence of pulmonic stenosis. Aorta: The aortic root is normal in size and structure. IAS/Shunts: No atrial level shunt detected by color flow Doppler. Additional Comments: There is no pleural effusion. LEFT VENTRICLE PLAX 2D                        Biplane EF (MOD) LVIDd:         3.85 cm         LV Biplane EF:   Left LVIDs:         2.40 cm                          ventricular LV PW:         1.10 cm                          ejection LV IVS:        1.05 cm                          fraction by                                                 2D MOD                                                 biplane is LV Volumes (MOD)                                54.4 %. LV vol d, MOD    43.8 ml A2C: LV vol d, MOD    49.9 ml A4C: LV vol s, MOD    19.4 ml A2C: LV vol s, MOD    24.1 ml A4C: LV SV MOD A2C:   24.4 ml LV SV MOD A4C:   49.9 ml LV SV MOD BP:    25.9 ml LEFT ATRIUM         Index LA diam:    3.70 cm 1.68 cm/m   AORTA Ao Root diam: 2.90 cm TRICUSPID VALVE TR Peak grad:   19.7 mmHg TR Vmax:        222.00 cm/s Adrian Prows MD Electronically signed by  Adrian Prows MD Signature Date/Time: 01/26/2021/6:35:13 AM    Final    IR THORACENTESIS ASP PLEURAL SPACE W/IMG GUIDE  Result Date: 01/26/2021 INDICATION: Patient with acute on chronic congestive heart failure, left pleural effusion. Request is made for diagnostic and therapeutic thoracentesis. EXAM: ULTRASOUND GUIDED DIAGNOSTIC AND THERAPEUTIC LEFT THORACENTESIS MEDICATIONS: 10 mL 1% lidocaine COMPLICATIONS: None immediate. PROCEDURE: An ultrasound guided thoracentesis was thoroughly discussed with the patient and questions answered. The benefits, risks, alternatives and complications were also discussed. The patient understands and wishes to proceed with the procedure. Written consent was obtained. Ultrasound was performed to localize and mark an adequate pocket of fluid in the left chest. The area  was then prepped and draped in the normal sterile fashion. 1% Lidocaine was used for local anesthesia. Under ultrasound guidance a 6 Fr Safe-T-Centesis catheter was introduced. Thoracentesis was performed. The catheter was removed and a dressing applied. FINDINGS: A total of approximately 850 mL of dark yellow fluid was removed. Samples were sent to the laboratory as requested by the clinical team. IMPRESSION: Successful ultrasound guided diagnostic and therapeutic left thoracentesis yielding 850 mL of pleural fluid. Read by: Brynda Greathouse PA-C Electronically Signed   By: Markus Daft M.D.   On: 01/26/2021 11:15    (Echo, Carotid, EGD, Colonoscopy, ERCP)    Subjective: No new complaints feels great  Discharge Exam: Vitals:   01/29/21 2344 01/30/21 0341  BP: 126/83 130/65  Pulse: 94 75  Resp: 17 18  Temp: 97.6 F (36.4 C) 97.8 F (36.6 C)  SpO2: 97% 96%   Vitals:   01/29/21 2119 01/29/21 2344 01/30/21 0341 01/30/21 0535  BP:  126/83 130/65   Pulse:  94 75   Resp:  17 18   Temp:  97.6 F (36.4 C) 97.8 F (36.6 C)   TempSrc:  Oral Oral   SpO2: 95% 97% 96%   Weight:    111.8 kg  Height:         General: Pt is alert, awake, not in acute distress Cardiovascular: RRR, S1/S2 +, no rubs, no gallops Respiratory: CTA bilaterally, no wheezing, no rhonchi Abdominal: Soft, NT, ND, bowel sounds + Extremities: no edema, no cyanosis    The results of significant diagnostics from this hospitalization (including imaging, microbiology, ancillary and laboratory) are listed below for reference.     Microbiology: Recent Results (from the past 240 hour(s))  SARS CORONAVIRUS 2 (TAT 6-24 HRS) Nasopharyngeal Nasopharyngeal Swab     Status: None   Collection Time: 01/22/21  4:12 PM   Specimen: Nasopharyngeal Swab  Result Value Ref Range Status   SARS Coronavirus 2 NEGATIVE NEGATIVE Final    Comment: (NOTE) SARS-CoV-2 target nucleic acids are NOT DETECTED.  The SARS-CoV-2 RNA is generally detectable in upper and lower respiratory specimens during the acute phase of infection. Negative results do not preclude SARS-CoV-2 infection, do not rule out co-infections with other pathogens, and should not be used as the sole basis for treatment or other patient management decisions. Negative results must be combined with clinical observations, patient history, and epidemiological information. The expected result is Negative.  Fact Sheet for Patients: SugarRoll.be  Fact Sheet for Healthcare Providers: https://www.woods-mathews.com/  This test is not yet approved or cleared by the [place] FDA and  has been authorized for detection and/or diagnosis of SARS-CoV-2 by FDA under an Emergency Use Authorization (EUA). This EUA will remain  in effect (meaning this test can be used) for the duration of the COVID-19 declaration under Se ction 564(b)(1) of the Act, 21 U.S.C. section 360bbb-3(b)(1), unless the authorization is terminated or revoked sooner.  Performed at [facility], [place] [address] [place], [place] [address]   Body fluid culture w  Gram Stain     Status: None   Collection Time: 01/24/21  9:28 AM   Specimen: PATH Cytology Misc. fluid; Body Fluid  Result Value Ref Range Status   Specimen Description FLUID  Final   Special Requests NONE  Final   Gram Stain   Final    FEW WBC PRESENT, PREDOMINANTLY PMN NO ORGANISMS SEEN    Culture   Final    NO GROWTH 3 DAYS Performed at  [facility], [place] [address], [place], [place] [address]    Report Status 01/27/2021 FINAL  Final  Culture, body fluid w Gram Stain-bottle     Status: None (Preliminary result)   Collection Time: 01/26/21 10:55 AM   Specimen: Pleura  Result Value Ref Range Status   Specimen Description PLEURAL FLUID LUNG LEFT  Final   Special Requests LUNG LEFT  Final   Culture   Final    NO GROWTH 3 DAYS Performed at [place] [address], [place], [place] [address]    Report Status PENDING  Incomplete  Gram stain     Status: None   Collection Time: 01/26/21 10:55 AM   Specimen: Pleura  Result Value Ref Range Status   Specimen Description PLEURAL FLUID LUNG LEFT  Final   Special Requests LUNG LEFT  Final   Gram Stain   Final    ABUNDANT WBC PRESENT,BOTH PMN AND MONONUCLEAR NO ORGANISMS SEEN Performed at [facility], [address] [place], [place] [address]    Report Status 01/26/2021 FINAL  Final     Labs: BNP (last 3 results) Recent Labs    01/28/21 0731 01/29/21 0109 01/30/21 0135  BNP 635.2* 358.2* 426.8*   Basic Metabolic Panel: Recent Labs  Lab 01/24/21 0121 01/25/21 0757 01/26/21 0048 01/27/21 0144 01/28/21 0731 01/29/21 0109  NA 133* 136 137  --  138 136  K 4.8 4.2 4.9  --  4.0 3.8  CL 94* 96* 95*  --  92* 92*  CO2 25 30 32  --  34* 34*  GLUCOSE 98 83 94  --  90 104*  BUN 49* 46* 46*  --  20 19  CREATININE 1.87* 1.84* 1.85*  --  1.37* 1.30*  CALCIUM 7.9* 8.5* 8.5*  --  8.6* 8.2*  MG  --   --   --  1.9  --   --    Liver Function Tests: No results for input(s): AST, ALT, ALKPHOS, BILITOT, PROT,  ALBUMIN in the last 168 hours. No results for input(s): LIPASE, AMYLASE in the last 168 hours. No results for input(s): AMMONIA in the last 168 hours. CBC: Recent Labs  Lab 01/24/21 0121 01/25/21 0757 01/28/21 0858 01/30/21 0135  WBC 13.7* 9.7 14.1* 9.8  NEUTROABS  --   --  12.3* 8.1*  HGB 10.4* 10.4* 11.8* 10.8*  HCT 35.4* 37.0 39.5 35.9*  MCV 72.7* 74.6* 71.8* 71.7*  PLT 417* 402* 473* 356   Cardiac Enzymes: No results for input(s): CKTOTAL, CKMB, CKMBINDEX, TROPONINI in the last 168 hours. BNP: Invalid input(s): POCBNP CBG: No results for input(s): GLUCAP in the last 168 hours. D-Dimer No results for input(s): DDIMER in the last 72 hours. Hgb A1c No results for input(s): HGBA1C in the last 72 hours. Lipid Profile No results for input(s): CHOL, HDL, LDLCALC, TRIG, CHOLHDL, LDLDIRECT in the last 72 hours. Thyroid function studies No results for input(s): TSH, T4TOTAL, T3FREE, THYROIDAB in the last 72 hours.  Invalid input(s): FREET3 Anemia work up No results for input(s): VITAMINB12, FOLATE, FERRITIN, TIBC, IRON, RETICCTPCT in the last 72 hours. Urinalysis    Component Value Date/Time   COLORURINE STRAW (A) 05/20/2017 1105   APPEARANCEUR CLEAR 05/20/2017 1105   LABSPEC 1.005 05/20/2017 1105   PHURINE 7.0 05/20/2017 1105   GLUCOSEU NEGATIVE 05/20/2017 1105   HGBUR SMALL (A) 05/20/2017 1105   BILIRUBINUR NEGATIVE 05/20/2017 1105   KETONESUR NEGATIVE 05/20/2017 1105   PROTEINUR NEGATIVE 05/20/2017  1105   UROBILINOGEN 0.2 01/31/2011 1208   NITRITE NEGATIVE 05/20/2017 1105   LEUKOCYTESUR MODERATE (A) 05/20/2017 1105   Sepsis Labs Invalid input(s): PROCALCITONIN,  WBC,  LACTICIDVEN Microbiology Recent Results (from the past 240 hour(s))  SARS CORONAVIRUS 2 (TAT 6-24 HRS) Nasopharyngeal Nasopharyngeal Swab     Status: None   Collection Time: 01/22/21  4:12 PM   Specimen: Nasopharyngeal Swab  Result Value Ref Range Status   SARS Coronavirus 2 NEGATIVE NEGATIVE  Final    Comment: (NOTE) SARS-CoV-2 target nucleic acids are NOT DETECTED.  The SARS-CoV-2 RNA is generally detectable in upper and lower respiratory specimens during the acute phase of infection. Negative results do not preclude SARS-CoV-2 infection, do not rule out co-infections with other pathogens, and should not be used as the sole basis for treatment or other patient management decisions. Negative results must be combined with clinical observations, patient history, and epidemiological information. The expected result is Negative.  Fact Sheet for Patients: SugarRoll.be  Fact Sheet for Healthcare Providers: https://www.woods-mathews.com/  This test is not yet approved or cleared by the [place] FDA and  has been authorized for detection and/or diagnosis of SARS-CoV-2 by FDA under an Emergency Use Authorization (EUA). This EUA will remain  in effect (meaning this test can be used) for the duration of the COVID-19 declaration under Se ction 564(b)(1) of the Act, 21 U.S.C. section 360bbb-3(b)(1), unless the authorization is terminated or revoked sooner.  Performed at [facility], [place] [address] [place], [place] [address]   Body fluid culture w Gram Stain     Status: None   Collection Time: 01/24/21  9:28 AM   Specimen: PATH Cytology Misc. fluid; Body Fluid  Result Value Ref Range Status   Specimen Description FLUID  Final   Special Requests NONE  Final   Gram Stain   Final    FEW WBC PRESENT, PREDOMINANTLY PMN NO ORGANISMS SEEN    Culture   Final    NO GROWTH 3 DAYS Performed at [facility], [address], [place], [place] [address]    Report Status 01/27/2021 FINAL  Final  Culture, body fluid w Gram Stain-bottle     Status: None (Preliminary result)   Collection Time: 01/26/21 10:55 AM   Specimen: Pleura  Result Value Ref Range Status   Specimen Description PLEURAL FLUID LUNG LEFT  Final   Special  Requests LUNG LEFT  Final   Culture   Final    NO GROWTH 3 DAYS Performed at [place] [address] [place], [place] [address]    Report Status PENDING  Incomplete  Gram stain     Status: None   Collection Time: 01/26/21 10:55 AM   Specimen: Pleura  Result Value Ref Range Status   Specimen Description PLEURAL FLUID LUNG LEFT  Final   Special Requests LUNG LEFT  Final   Gram Stain   Final    ABUNDANT WBC PRESENT,BOTH PMN AND MONONUCLEAR NO ORGANISMS SEEN Performed at [facility], [address], [place], [place] [address]    Report Status 01/26/2021 FINAL  Final     Time coordinating discharge: Over 30 minutes  SIGNED:   Charlynne Cousins, MD  [facility] 01/30/2021, 7:29 AM Pager   If 7PM-7AM, please contact night-coverage [email or web address] Password [email or web address]

## 2021-01-31 ENCOUNTER — Telehealth: Payer: Self-pay

## 2021-01-31 DIAGNOSIS — E785 Hyperlipidemia, unspecified: Secondary | ICD-10-CM | POA: Diagnosis not present

## 2021-01-31 DIAGNOSIS — I5033 Acute on chronic diastolic (congestive) heart failure: Secondary | ICD-10-CM | POA: Diagnosis not present

## 2021-01-31 DIAGNOSIS — N179 Acute kidney failure, unspecified: Secondary | ICD-10-CM | POA: Diagnosis not present

## 2021-01-31 DIAGNOSIS — I130 Hypertensive heart and chronic kidney disease with heart failure and stage 1 through stage 4 chronic kidney disease, or unspecified chronic kidney disease: Secondary | ICD-10-CM | POA: Diagnosis not present

## 2021-01-31 DIAGNOSIS — I4811 Longstanding persistent atrial fibrillation: Secondary | ICD-10-CM | POA: Diagnosis not present

## 2021-01-31 DIAGNOSIS — E039 Hypothyroidism, unspecified: Secondary | ICD-10-CM | POA: Diagnosis not present

## 2021-01-31 DIAGNOSIS — J9601 Acute respiratory failure with hypoxia: Secondary | ICD-10-CM | POA: Diagnosis not present

## 2021-01-31 DIAGNOSIS — J181 Lobar pneumonia, unspecified organism: Secondary | ICD-10-CM | POA: Diagnosis not present

## 2021-01-31 DIAGNOSIS — I313 Pericardial effusion (noninflammatory): Secondary | ICD-10-CM | POA: Diagnosis not present

## 2021-01-31 DIAGNOSIS — D509 Iron deficiency anemia, unspecified: Secondary | ICD-10-CM | POA: Diagnosis not present

## 2021-01-31 DIAGNOSIS — N1831 Chronic kidney disease, stage 3a: Secondary | ICD-10-CM | POA: Diagnosis not present

## 2021-01-31 LAB — CULTURE, BODY FLUID W GRAM STAIN -BOTTLE: Culture: NO GROWTH

## 2021-01-31 NOTE — Telephone Encounter (Signed)
-----   Message from Tera Partridge sent at 01/31/2021 10:04 AM EST ----- Regarding: RE: Appt & TOC Ladies - please follow-up with patient has was discharged 3/1.  Thanks.  Anabell   ----- Message ----- From: Nigel Mormon, MD Sent: 01/29/2021   4:05 PM EST To: [facility], # Subject: Appt & TOC                                     Regarding: Appt & TOC Discharge follow up: TOC: Needed Follow up appt: Needed in 1-2 weeks, echcoardiogram in AM and appt in early PM Discharge diagnosis: Heart failure, pericardial effusion Discharge date: 2/28  Thanks MJP

## 2021-02-01 ENCOUNTER — Encounter: Payer: Self-pay | Admitting: Cardiology

## 2021-02-01 ENCOUNTER — Ambulatory Visit: Payer: Medicare HMO | Admitting: Cardiology

## 2021-02-01 ENCOUNTER — Telehealth: Payer: Self-pay

## 2021-02-01 ENCOUNTER — Other Ambulatory Visit: Payer: Self-pay

## 2021-02-01 VITALS — BP 128/79 | HR 85 | Ht 64.0 in | Wt 214.0 lb

## 2021-02-01 DIAGNOSIS — I3139 Other pericardial effusion (noninflammatory): Secondary | ICD-10-CM

## 2021-02-01 DIAGNOSIS — I4811 Longstanding persistent atrial fibrillation: Secondary | ICD-10-CM | POA: Diagnosis not present

## 2021-02-01 DIAGNOSIS — C801 Malignant (primary) neoplasm, unspecified: Secondary | ICD-10-CM | POA: Diagnosis not present

## 2021-02-01 DIAGNOSIS — I313 Pericardial effusion (noninflammatory): Secondary | ICD-10-CM | POA: Diagnosis not present

## 2021-02-01 DIAGNOSIS — I10 Essential (primary) hypertension: Secondary | ICD-10-CM | POA: Diagnosis not present

## 2021-02-01 DIAGNOSIS — R110 Nausea: Secondary | ICD-10-CM | POA: Diagnosis not present

## 2021-02-01 DIAGNOSIS — I3131 Malignant pericardial effusion in diseases classified elsewhere: Secondary | ICD-10-CM

## 2021-02-01 NOTE — Progress Notes (Addendum)
Subjective:   [name], female    DOB: 1945-12-10, 75 y.o.   MRN: [id number]   Chief Complaint  Patient presents with  . heart failure     75 year old Caucasian female with hypertension, persistent atrial fibrillation, moderate obesity, HFpEF, pericardial effusion s/p pericardiocentesis (01/2021), here for hospital f/u visit  Patient has had improvement with diuresis. However, patient reports occasional nausea with medication intake. Denies vomiting, diarrhea, abdominal pain. Leg swelling has improved.    Current Outpatient Medications on File Prior to Visit  Medication Sig Dispense Refill  . acetaminophen (TYLENOL) 325 MG tablet Take 325-650 mg by mouth at bedtime as needed for mild pain.    Marland Kitchen amiodarone (PACERONE) 100 MG tablet Take 1 tablet (100 mg total) by mouth daily. 30 tablet 3  . Calcium Carb-Cholecalciferol (CALTRATE 600+D3 PO) Take 1 tablet by mouth every evening.    . [facility]S IRON 325 (65 Fe) MG tablet Take 325 mg by mouth daily at 6 PM.    . digoxin (LANOXIN) 0.125 MG tablet Take 1 tablet (0.125 mg total) by mouth daily. 30 tablet 0  . ELIQUIS 5 MG TABS tablet TAKE 1 TABLET BY MOUTH TWICE A DAY (Patient taking differently: Take 5 mg by mouth 2 (two) times daily.) 60 tablet 5  . furosemide (LASIX) 40 MG tablet TAKE 1 TABLET BY MOUTH EVERY DAY 90 tablet 1  . levothyroxine (SYNTHROID, LEVOTHROID) 125 MCG tablet Take 125 mcg by mouth daily before breakfast.    . magic mouthwash SOLN Take 5 mLs by mouth See admin instructions. Swish 5 ml's in the mouth, then expectorate, four times a day FOR 9 DAYS    . metoprolol tartrate (LOPRESSOR) 25 MG tablet Take 1 tablet (25 mg total) by mouth 2 (two) times daily. 30 tablet 3  . omeprazole (PRILOSEC) 20 MG capsule TAKE 1 CAPSULE BY MOUTH EVERY DAY (Patient taking differently: Take 20 mg by mouth daily before breakfast.) 90 capsule 1  . pravastatin (PRAVACHOL) 80 MG tablet Take 80 mg by mouth in the morning.    Marland Kitchen spironolactone  (ALDACTONE) 25 MG tablet Take 25 mg by mouth daily.    . vitamin B-12 (CYANOCOBALAMIN) 1000 MCG tablet Take 1,000 mcg by mouth daily.     No current facility-administered medications on file prior to visit.    Cardiovascular and other pertinent studies:  EKG 02/01/2021: Atrial fibrillation 79 bpm Low voltage in precordial leads Anterior infarct -age undetermined Nonspecific ST-T changes. Low suspicion for dig toxicity  CXT 01/12/2019: Slowly resolving congestive failure pattern. Possible superimposed pneumonia in LLL  EKG 12/25/2020: Atrial fibrillation 77 bpm  Echocardiogram 04/30/2017: Poor echo window. Wall motion abnormality has reduced sensitivity. Left ventricle cavity is normal in size. Mild decrease in LV systolic function. Global hypokinesis. Visual EF is 40-45%. Unable to evaluate diastolic function due to A. Fibrillation. Calculated EF 52%. Left atrial cavity is moderately dilated by volume. Mild tricuspid regurgitation. Mild pulmonary hypertension. Pulmonary artery systolic pressure is estimated at 34 mm Hg. Mild pulmonic regurgitation.   Recent labs: 01/12/2021: Glucose 67, BUN/Cr 26/1.2. EGFR 43. Na/K 134/4.4. Rest of the CMP normal  12/25/2020: BNP 265  09/14/2019: Glucose 108, BUN/Cr 16/1.4. EGFR ?. Na/K ?/?.  Rest of the CMP normal.  Chol 183, TG 123, HDL 47, LDL 111    Review of Systems  Cardiovascular: Positive for dyspnea on exertion (Improved). Negative for chest pain, leg swelling, palpitations and syncope.  Respiratory: Positive for shortness of breath (Improved).  Gastrointestinal: Positive for nausea. Negative for abdominal pain, diarrhea and vomiting.       Vitals:   02/01/21 1420  BP: 128/79  Pulse: 85  SpO2: 96%     Body mass index is 36.73 kg/m.   Objective:   Physical Exam Vitals and nursing note reviewed.  Constitutional:      Appearance: She is well-developed.  Neck:     Vascular: No JVD.  Cardiovascular:     Rate and  Rhythm: Normal rate. Rhythm irregular.     Pulses: Intact distal pulses.     Heart sounds: Normal heart sounds. No murmur heard.   Pulmonary:     Effort: No respiratory distress.     Breath sounds: No wheezing or rales.  Musculoskeletal:     Right lower leg: Edema (1+) present.     Left lower leg: Edema (1+) present.         Assessment & Recommendations:   75 year old Caucasian female with hypertension, persistent atrial fibrillation, moderate obesity, HFpEF  Nausea: Possible pill esophagitis Recommend taking with food and water  Chronic HFpEF:. Relatively stable. Continue spironolactone 25 mg daily, lasix 40 mg daily  Persistent Afib: Rate controlled, asymptomatic. Continue metoprolol tartrate 25 mg bid, digozxin 0.125 mg daily. Check digoxin level Stop amiodarone.  Check digoxin level CHA2DS2VASc score 3: Annual stroke risk 4% Continue eliquis 5 mg bid.   Pericardial effusion: S/p pericardiocentesis 01/2021 Will check echocardiogram in 1-2 weeks  Hypertension: Controlled    Nigel Mormon, MD [facility] Pager: [phone] Office: [phone]

## 2021-02-01 NOTE — Telephone Encounter (Signed)
Spoke with the patient's daughter Levada Dy. Recommended hydration. If this worsens, or she has any disorientation etc, they know to take her to urgent care or ER.   On a separate note, please mvoe her appt with me to a sooner date than 3/18, if possible.

## 2021-02-01 NOTE — Telephone Encounter (Signed)
Vida Roller is trying to mover her appointment up now.

## 2021-02-01 NOTE — Telephone Encounter (Signed)
Not sure which one of you is her provider, but pts daughter called and stated that the pt was in the hospital 2 days ago. The pt now has a dry cough and have been extremely nauseous. The pt can not smell anything without wanting to throw up.  Levada Dy, [phone])

## 2021-02-02 DIAGNOSIS — N1831 Chronic kidney disease, stage 3a: Secondary | ICD-10-CM | POA: Diagnosis not present

## 2021-02-02 DIAGNOSIS — I4811 Longstanding persistent atrial fibrillation: Secondary | ICD-10-CM | POA: Diagnosis not present

## 2021-02-02 DIAGNOSIS — D509 Iron deficiency anemia, unspecified: Secondary | ICD-10-CM | POA: Diagnosis not present

## 2021-02-02 DIAGNOSIS — J181 Lobar pneumonia, unspecified organism: Secondary | ICD-10-CM | POA: Diagnosis not present

## 2021-02-02 DIAGNOSIS — I5033 Acute on chronic diastolic (congestive) heart failure: Secondary | ICD-10-CM | POA: Diagnosis not present

## 2021-02-02 DIAGNOSIS — I130 Hypertensive heart and chronic kidney disease with heart failure and stage 1 through stage 4 chronic kidney disease, or unspecified chronic kidney disease: Secondary | ICD-10-CM | POA: Diagnosis not present

## 2021-02-02 DIAGNOSIS — N179 Acute kidney failure, unspecified: Secondary | ICD-10-CM | POA: Diagnosis not present

## 2021-02-02 DIAGNOSIS — J9601 Acute respiratory failure with hypoxia: Secondary | ICD-10-CM | POA: Diagnosis not present

## 2021-02-02 DIAGNOSIS — E039 Hypothyroidism, unspecified: Secondary | ICD-10-CM | POA: Diagnosis not present

## 2021-02-02 DIAGNOSIS — I313 Pericardial effusion (noninflammatory): Secondary | ICD-10-CM | POA: Diagnosis not present

## 2021-02-02 LAB — COMPREHENSIVE METABOLIC PANEL
ALT: 15 IU/L (ref 0–32)
AST: 25 IU/L (ref 0–40)
Albumin/Globulin Ratio: 1.1 — ABNORMAL LOW (ref 1.2–2.2)
Albumin: 3.3 g/dL — ABNORMAL LOW (ref 3.7–4.7)
Alkaline Phosphatase: 59 IU/L (ref 44–121)
BUN/Creatinine Ratio: 11 — ABNORMAL LOW (ref 12–28)
BUN: 12 mg/dL (ref 8–27)
Bilirubin Total: 0.4 mg/dL (ref 0.0–1.2)
CO2: 31 mmol/L — ABNORMAL HIGH (ref 20–29)
Calcium: 8.8 mg/dL (ref 8.7–10.3)
Chloride: 90 mmol/L — ABNORMAL LOW (ref 96–106)
Creatinine, Ser: 1.14 mg/dL — ABNORMAL HIGH (ref 0.57–1.00)
Globulin, Total: 2.9 g/dL (ref 1.5–4.5)
Glucose: 83 mg/dL (ref 65–99)
Potassium: 3.7 mmol/L (ref 3.5–5.2)
Sodium: 137 mmol/L (ref 134–144)
Total Protein: 6.2 g/dL (ref 6.0–8.5)
eGFR: 50 mL/min/{1.73_m2} — ABNORMAL LOW (ref >59)

## 2021-02-02 LAB — DIGOXIN LEVEL: Digoxin, Serum: 2 ng/mL — ABNORMAL HIGH (ref 0.5–0.9)

## 2021-02-02 NOTE — Progress Notes (Signed)
Stop digoxin. Resume amiodarone 100 mg daily. Called patient's daughter and conveyed the same.   Nigel Mormon, MD Pager: [phone] Office: [phone]

## 2021-02-05 NOTE — Telephone Encounter (Signed)
Error

## 2021-02-06 DIAGNOSIS — N179 Acute kidney failure, unspecified: Secondary | ICD-10-CM | POA: Diagnosis not present

## 2021-02-06 DIAGNOSIS — D509 Iron deficiency anemia, unspecified: Secondary | ICD-10-CM | POA: Diagnosis not present

## 2021-02-06 DIAGNOSIS — J181 Lobar pneumonia, unspecified organism: Secondary | ICD-10-CM | POA: Diagnosis not present

## 2021-02-06 DIAGNOSIS — I5033 Acute on chronic diastolic (congestive) heart failure: Secondary | ICD-10-CM | POA: Diagnosis not present

## 2021-02-06 DIAGNOSIS — N1831 Chronic kidney disease, stage 3a: Secondary | ICD-10-CM | POA: Diagnosis not present

## 2021-02-06 DIAGNOSIS — J9601 Acute respiratory failure with hypoxia: Secondary | ICD-10-CM | POA: Diagnosis not present

## 2021-02-06 DIAGNOSIS — I4811 Longstanding persistent atrial fibrillation: Secondary | ICD-10-CM | POA: Diagnosis not present

## 2021-02-06 DIAGNOSIS — E039 Hypothyroidism, unspecified: Secondary | ICD-10-CM | POA: Diagnosis not present

## 2021-02-06 DIAGNOSIS — I130 Hypertensive heart and chronic kidney disease with heart failure and stage 1 through stage 4 chronic kidney disease, or unspecified chronic kidney disease: Secondary | ICD-10-CM | POA: Diagnosis not present

## 2021-02-06 DIAGNOSIS — I313 Pericardial effusion (noninflammatory): Secondary | ICD-10-CM | POA: Diagnosis not present

## 2021-02-07 ENCOUNTER — Ambulatory Visit: Payer: Medicare HMO

## 2021-02-07 ENCOUNTER — Other Ambulatory Visit: Payer: Self-pay

## 2021-02-07 DIAGNOSIS — I3139 Other pericardial effusion (noninflammatory): Secondary | ICD-10-CM

## 2021-02-07 DIAGNOSIS — N179 Acute kidney failure, unspecified: Secondary | ICD-10-CM | POA: Diagnosis not present

## 2021-02-07 DIAGNOSIS — I4811 Longstanding persistent atrial fibrillation: Secondary | ICD-10-CM

## 2021-02-07 DIAGNOSIS — I313 Pericardial effusion (noninflammatory): Secondary | ICD-10-CM | POA: Diagnosis not present

## 2021-02-07 DIAGNOSIS — J181 Lobar pneumonia, unspecified organism: Secondary | ICD-10-CM | POA: Diagnosis not present

## 2021-02-07 DIAGNOSIS — I5033 Acute on chronic diastolic (congestive) heart failure: Secondary | ICD-10-CM | POA: Diagnosis not present

## 2021-02-07 DIAGNOSIS — J9601 Acute respiratory failure with hypoxia: Secondary | ICD-10-CM | POA: Diagnosis not present

## 2021-02-07 DIAGNOSIS — E039 Hypothyroidism, unspecified: Secondary | ICD-10-CM | POA: Diagnosis not present

## 2021-02-07 DIAGNOSIS — N1831 Chronic kidney disease, stage 3a: Secondary | ICD-10-CM | POA: Diagnosis not present

## 2021-02-07 DIAGNOSIS — I130 Hypertensive heart and chronic kidney disease with heart failure and stage 1 through stage 4 chronic kidney disease, or unspecified chronic kidney disease: Secondary | ICD-10-CM | POA: Diagnosis not present

## 2021-02-07 DIAGNOSIS — D509 Iron deficiency anemia, unspecified: Secondary | ICD-10-CM | POA: Diagnosis not present

## 2021-02-08 DIAGNOSIS — I313 Pericardial effusion (noninflammatory): Secondary | ICD-10-CM | POA: Diagnosis not present

## 2021-02-08 DIAGNOSIS — I5033 Acute on chronic diastolic (congestive) heart failure: Secondary | ICD-10-CM | POA: Diagnosis not present

## 2021-02-08 DIAGNOSIS — N179 Acute kidney failure, unspecified: Secondary | ICD-10-CM | POA: Diagnosis not present

## 2021-02-08 DIAGNOSIS — E039 Hypothyroidism, unspecified: Secondary | ICD-10-CM | POA: Diagnosis not present

## 2021-02-08 DIAGNOSIS — I130 Hypertensive heart and chronic kidney disease with heart failure and stage 1 through stage 4 chronic kidney disease, or unspecified chronic kidney disease: Secondary | ICD-10-CM | POA: Diagnosis not present

## 2021-02-08 DIAGNOSIS — J181 Lobar pneumonia, unspecified organism: Secondary | ICD-10-CM | POA: Diagnosis not present

## 2021-02-08 DIAGNOSIS — J9601 Acute respiratory failure with hypoxia: Secondary | ICD-10-CM | POA: Diagnosis not present

## 2021-02-08 DIAGNOSIS — N1831 Chronic kidney disease, stage 3a: Secondary | ICD-10-CM | POA: Diagnosis not present

## 2021-02-08 DIAGNOSIS — D509 Iron deficiency anemia, unspecified: Secondary | ICD-10-CM | POA: Diagnosis not present

## 2021-02-08 DIAGNOSIS — I4811 Longstanding persistent atrial fibrillation: Secondary | ICD-10-CM | POA: Diagnosis not present

## 2021-02-09 DIAGNOSIS — E039 Hypothyroidism, unspecified: Secondary | ICD-10-CM | POA: Diagnosis not present

## 2021-02-09 DIAGNOSIS — I313 Pericardial effusion (noninflammatory): Secondary | ICD-10-CM | POA: Diagnosis not present

## 2021-02-09 DIAGNOSIS — J9601 Acute respiratory failure with hypoxia: Secondary | ICD-10-CM | POA: Diagnosis not present

## 2021-02-09 DIAGNOSIS — I4811 Longstanding persistent atrial fibrillation: Secondary | ICD-10-CM | POA: Diagnosis not present

## 2021-02-09 DIAGNOSIS — N179 Acute kidney failure, unspecified: Secondary | ICD-10-CM | POA: Diagnosis not present

## 2021-02-09 DIAGNOSIS — J181 Lobar pneumonia, unspecified organism: Secondary | ICD-10-CM | POA: Diagnosis not present

## 2021-02-09 DIAGNOSIS — I130 Hypertensive heart and chronic kidney disease with heart failure and stage 1 through stage 4 chronic kidney disease, or unspecified chronic kidney disease: Secondary | ICD-10-CM | POA: Diagnosis not present

## 2021-02-09 DIAGNOSIS — I5033 Acute on chronic diastolic (congestive) heart failure: Secondary | ICD-10-CM | POA: Diagnosis not present

## 2021-02-09 DIAGNOSIS — D509 Iron deficiency anemia, unspecified: Secondary | ICD-10-CM | POA: Diagnosis not present

## 2021-02-09 DIAGNOSIS — N1831 Chronic kidney disease, stage 3a: Secondary | ICD-10-CM | POA: Diagnosis not present

## 2021-02-09 NOTE — Progress Notes (Signed)
Your patient 

## 2021-02-12 DIAGNOSIS — E039 Hypothyroidism, unspecified: Secondary | ICD-10-CM | POA: Diagnosis not present

## 2021-02-12 DIAGNOSIS — J181 Lobar pneumonia, unspecified organism: Secondary | ICD-10-CM | POA: Diagnosis not present

## 2021-02-12 DIAGNOSIS — I5033 Acute on chronic diastolic (congestive) heart failure: Secondary | ICD-10-CM | POA: Diagnosis not present

## 2021-02-12 DIAGNOSIS — N1831 Chronic kidney disease, stage 3a: Secondary | ICD-10-CM | POA: Diagnosis not present

## 2021-02-12 DIAGNOSIS — N179 Acute kidney failure, unspecified: Secondary | ICD-10-CM | POA: Diagnosis not present

## 2021-02-12 DIAGNOSIS — J9601 Acute respiratory failure with hypoxia: Secondary | ICD-10-CM | POA: Diagnosis not present

## 2021-02-12 DIAGNOSIS — D509 Iron deficiency anemia, unspecified: Secondary | ICD-10-CM | POA: Diagnosis not present

## 2021-02-12 DIAGNOSIS — I130 Hypertensive heart and chronic kidney disease with heart failure and stage 1 through stage 4 chronic kidney disease, or unspecified chronic kidney disease: Secondary | ICD-10-CM | POA: Diagnosis not present

## 2021-02-12 DIAGNOSIS — I4811 Longstanding persistent atrial fibrillation: Secondary | ICD-10-CM | POA: Diagnosis not present

## 2021-02-12 DIAGNOSIS — I313 Pericardial effusion (noninflammatory): Secondary | ICD-10-CM | POA: Diagnosis not present

## 2021-02-13 DIAGNOSIS — N1831 Chronic kidney disease, stage 3a: Secondary | ICD-10-CM | POA: Diagnosis not present

## 2021-02-13 DIAGNOSIS — D509 Iron deficiency anemia, unspecified: Secondary | ICD-10-CM | POA: Diagnosis not present

## 2021-02-13 DIAGNOSIS — N179 Acute kidney failure, unspecified: Secondary | ICD-10-CM | POA: Diagnosis not present

## 2021-02-13 DIAGNOSIS — I130 Hypertensive heart and chronic kidney disease with heart failure and stage 1 through stage 4 chronic kidney disease, or unspecified chronic kidney disease: Secondary | ICD-10-CM | POA: Diagnosis not present

## 2021-02-13 DIAGNOSIS — J9601 Acute respiratory failure with hypoxia: Secondary | ICD-10-CM | POA: Diagnosis not present

## 2021-02-13 DIAGNOSIS — I4811 Longstanding persistent atrial fibrillation: Secondary | ICD-10-CM | POA: Diagnosis not present

## 2021-02-13 DIAGNOSIS — E039 Hypothyroidism, unspecified: Secondary | ICD-10-CM | POA: Diagnosis not present

## 2021-02-13 DIAGNOSIS — I313 Pericardial effusion (noninflammatory): Secondary | ICD-10-CM | POA: Diagnosis not present

## 2021-02-13 DIAGNOSIS — J181 Lobar pneumonia, unspecified organism: Secondary | ICD-10-CM | POA: Diagnosis not present

## 2021-02-13 DIAGNOSIS — I5033 Acute on chronic diastolic (congestive) heart failure: Secondary | ICD-10-CM | POA: Diagnosis not present

## 2021-02-14 ENCOUNTER — Encounter: Payer: Self-pay | Admitting: Cardiology

## 2021-02-14 ENCOUNTER — Other Ambulatory Visit: Payer: Self-pay

## 2021-02-14 ENCOUNTER — Ambulatory Visit: Payer: Medicare HMO | Admitting: Cardiology

## 2021-02-14 VITALS — BP 148/94 | HR 100 | Temp 97.6°F | Resp 16 | Ht 64.0 in | Wt 229.0 lb

## 2021-02-14 DIAGNOSIS — I313 Pericardial effusion (noninflammatory): Secondary | ICD-10-CM

## 2021-02-14 DIAGNOSIS — I10 Essential (primary) hypertension: Secondary | ICD-10-CM | POA: Diagnosis not present

## 2021-02-14 DIAGNOSIS — I4811 Longstanding persistent atrial fibrillation: Secondary | ICD-10-CM

## 2021-02-14 DIAGNOSIS — I3139 Other pericardial effusion (noninflammatory): Secondary | ICD-10-CM

## 2021-02-14 MED ORDER — METOPROLOL TARTRATE 50 MG PO TABS: 25.0000 mg | ORAL_TABLET | Freq: Two times a day (BID) | ORAL | 2 refills | Status: DC

## 2021-02-14 NOTE — Progress Notes (Signed)
Subjective:   [name], female    DOB: 1946-10-14, 75 y.o.   MRN: [id number]   Chief Complaint  Patient presents with  . Hypertension  . Follow-up  . Results    echo     75 year old Caucasian female with hypertension, persistent atrial fibrillation, moderate obesity, HFpEF, pericardial effusion s/p pericardiocentesis (01/2021), here for hospital f/u visit  Patient is here with her daughter today. She is doing well. Leg swelling has improved, so has breathing. She stopped digoxin as per my recommendations, due to elevated dig level.   Current Outpatient Medications on File Prior to Visit  Medication Sig Dispense Refill  . acetaminophen (TYLENOL) 325 MG tablet Take 325-650 mg by mouth at bedtime as needed for mild pain.    . Calcium Carb-Cholecalciferol (CALTRATE 600+D3 PO) Take 1 tablet by mouth every evening.    . CVS IRON 325 (65 Fe) MG tablet Take 325 mg by mouth daily at 6 PM.    . ELIQUIS 5 MG TABS tablet TAKE 1 TABLET BY MOUTH TWICE A DAY (Patient taking differently: Take 5 mg by mouth 2 (two) times daily.) 60 tablet 5  . furosemide (LASIX) 40 MG tablet TAKE 1 TABLET BY MOUTH EVERY DAY 90 tablet 1  . levothyroxine (SYNTHROID, LEVOTHROID) 125 MCG tablet Take 125 mcg by mouth daily before breakfast.    . magic mouthwash SOLN Take 5 mLs by mouth See admin instructions. Swish 5 ml's in the mouth, then expectorate, four times a day FOR 9 DAYS    . metoprolol tartrate (LOPRESSOR) 25 MG tablet Take 1 tablet (25 mg total) by mouth 2 (two) times daily. 30 tablet 3  . omeprazole (PRILOSEC) 20 MG capsule TAKE 1 CAPSULE BY MOUTH EVERY DAY (Patient taking differently: Take 20 mg by mouth daily before breakfast.) 90 capsule 1  . pravastatin (PRAVACHOL) 80 MG tablet Take 80 mg by mouth in the morning.    Marland Kitchen spironolactone (ALDACTONE) 25 MG tablet Take 25 mg by mouth daily.    . vitamin B-12 (CYANOCOBALAMIN) 1000 MCG tablet Take 1,000 mcg by mouth daily.     No current  facility-administered medications on file prior to visit.    Cardiovascular and other pertinent studies:  Echocardiogram 02/07/2021:  Left ventricle cavity is normal in size and wall thickness. Normal LV  systolic function with visual EF 50-55%. Normal global wall motion. Unable  to evaluate diastolic function due to atrial fibrillation.  Left atrial cavity is moderately dilated.  Structurally normal trileaflet aortic valve. Mild aortic valve stenosis.  Vmax 1.8 m/sec, mean PG 9 mmHg, AVA 1.1 cm2 by continuity equation  Mild tricuspid regurgitation.  No evidence of pulmonary hypertension.  No significant change compared to previous study on 01/25/2021.   EKG 02/01/2021: Atrial fibrillation 79 bpm Low voltage in precordial leads Anterior infarct -age undetermined Nonspecific ST-T changes. Low suspicion for dig toxicity  CXT 01/12/2019: Slowly resolving congestive failure pattern. Possible superimposed pneumonia in LLL  EKG 12/25/2020: Atrial fibrillation 77 bpm  Recent labs: 02/01/2021: Glucose 83, BUN/Cr 12/1.14. EGFR 50. Na/K 137/3.7. Albumin 3.3. Rest of the CMP normal H/H 10.8/35.9. MCV 71.7. Platelets 356 HbA1C N/A Lipid panel N/A TSH N/A  01/30/2021: BNP 429  01/12/2021: Glucose 67, BUN/Cr 26/1.2. EGFR 43. Na/K 134/4.4. Rest of the CMP normal  12/25/2020: BNP 265  09/14/2019: Glucose 108, BUN/Cr 16/1.4. EGFR ?. Na/K ?/?.  Rest of the CMP normal.  Chol 183, TG 123, HDL 47, LDL 111    Review  of Systems  Cardiovascular: Positive for dyspnea on exertion (Improved). Negative for chest pain, leg swelling, palpitations and syncope.  Respiratory: Positive for shortness of breath (Improved).   Gastrointestinal: Positive for nausea. Negative for abdominal pain, diarrhea and vomiting.       Vitals:   02/14/21 1407  BP: (!) 148/94  Pulse: 100  Resp: 16  Temp: 97.6 F (36.4 C)  SpO2: 99%     Body mass index is 39.31 kg/m.   Objective:   Physical Exam Vitals and  nursing note reviewed.  Constitutional:      Appearance: She is well-developed.  Neck:     Vascular: No JVD.  Cardiovascular:     Rate and Rhythm: Normal rate. Rhythm irregular.     Pulses: Intact distal pulses.     Heart sounds: Normal heart sounds. No murmur heard.   Pulmonary:     Effort: No respiratory distress.     Breath sounds: No wheezing or rales.  Musculoskeletal:     Right lower leg: Edema (Trace) present.     Left lower leg: Edema (Trace) present.         Assessment & Recommendations:   75 year old Caucasian female with hypertension, persistent atrial fibrillation, moderate obesity, HFpEF  Chronic HFpEF:. Relatively stable. Continue spironolactone 25 mg daily, lasix 40 mg daily Recommend daily weight checks, low salt diet.  Persistent Afib: Rate controlled, asymptomatic. Increasee metoprolol tartrate to 50 mg bid. Stop amiodarone. Digoxin previously stopped. CHA2DS2VASc score 3: Annual stroke risk 4% Continue eliquis 5 mg bid.   Pericardial effusion: S/p pericardiocentesis 01/2021 No recurrence  Hypertension: Controlled  F/u in 3 months  Katrinka Herbison Esther Hardy, MD [facility] Pager: [phone] Office: [phone]

## 2021-02-15 DIAGNOSIS — E039 Hypothyroidism, unspecified: Secondary | ICD-10-CM | POA: Diagnosis not present

## 2021-02-15 DIAGNOSIS — N179 Acute kidney failure, unspecified: Secondary | ICD-10-CM | POA: Diagnosis not present

## 2021-02-15 DIAGNOSIS — N1831 Chronic kidney disease, stage 3a: Secondary | ICD-10-CM | POA: Diagnosis not present

## 2021-02-15 DIAGNOSIS — I5033 Acute on chronic diastolic (congestive) heart failure: Secondary | ICD-10-CM | POA: Diagnosis not present

## 2021-02-15 DIAGNOSIS — I313 Pericardial effusion (noninflammatory): Secondary | ICD-10-CM | POA: Diagnosis not present

## 2021-02-15 DIAGNOSIS — J181 Lobar pneumonia, unspecified organism: Secondary | ICD-10-CM | POA: Diagnosis not present

## 2021-02-15 DIAGNOSIS — D509 Iron deficiency anemia, unspecified: Secondary | ICD-10-CM | POA: Diagnosis not present

## 2021-02-15 DIAGNOSIS — I4811 Longstanding persistent atrial fibrillation: Secondary | ICD-10-CM | POA: Diagnosis not present

## 2021-02-15 DIAGNOSIS — I130 Hypertensive heart and chronic kidney disease with heart failure and stage 1 through stage 4 chronic kidney disease, or unspecified chronic kidney disease: Secondary | ICD-10-CM | POA: Diagnosis not present

## 2021-02-15 DIAGNOSIS — J9601 Acute respiratory failure with hypoxia: Secondary | ICD-10-CM | POA: Diagnosis not present

## 2021-02-16 ENCOUNTER — Other Ambulatory Visit: Payer: Self-pay

## 2021-02-16 ENCOUNTER — Ambulatory Visit: Payer: Medicare HMO | Admitting: Cardiology

## 2021-02-16 DIAGNOSIS — I10 Essential (primary) hypertension: Secondary | ICD-10-CM

## 2021-02-16 MED ORDER — METOPROLOL TARTRATE 50 MG PO TABS: 25.0000 mg | ORAL_TABLET | Freq: Two times a day (BID) | ORAL | 2 refills | Status: DC

## 2021-02-19 MED ORDER — METOPROLOL TARTRATE 50 MG PO TABS: 50.0000 mg | ORAL_TABLET | Freq: Two times a day (BID) | ORAL | 2 refills | Status: DC

## 2021-02-19 NOTE — Telephone Encounter (Signed)
Does he have an appt today?

## 2021-02-19 NOTE — Telephone Encounter (Signed)
From pt

## 2021-02-19 NOTE — Addendum Note (Signed)
Addended by: Nigel Mormon on: 02/19/2021 10:58 AM   Modules accepted: Orders

## 2021-02-20 ENCOUNTER — Other Ambulatory Visit: Payer: Self-pay

## 2021-02-20 DIAGNOSIS — I10 Essential (primary) hypertension: Secondary | ICD-10-CM

## 2021-02-20 MED ORDER — METOPROLOL TARTRATE 50 MG PO TABS: 50.0000 mg | ORAL_TABLET | Freq: Two times a day (BID) | ORAL | 2 refills | Status: DC

## 2021-05-10 ENCOUNTER — Other Ambulatory Visit: Payer: Self-pay

## 2021-05-10 ENCOUNTER — Encounter: Payer: Self-pay | Admitting: Cardiology

## 2021-05-10 ENCOUNTER — Ambulatory Visit: Payer: Medicare HMO | Admitting: Cardiology

## 2021-05-10 VITALS — BP 112/73 | HR 123 | Temp 98.6°F | Resp 16 | Ht 64.0 in | Wt 209.0 lb

## 2021-05-10 DIAGNOSIS — I5032 Chronic diastolic (congestive) heart failure: Secondary | ICD-10-CM | POA: Diagnosis not present

## 2021-05-10 DIAGNOSIS — I313 Pericardial effusion (noninflammatory): Secondary | ICD-10-CM

## 2021-05-10 DIAGNOSIS — I4811 Longstanding persistent atrial fibrillation: Secondary | ICD-10-CM | POA: Diagnosis not present

## 2021-05-10 DIAGNOSIS — I10 Essential (primary) hypertension: Secondary | ICD-10-CM

## 2021-05-10 DIAGNOSIS — I3139 Other pericardial effusion (noninflammatory): Secondary | ICD-10-CM

## 2021-05-10 MED ORDER — DILTIAZEM HCL ER COATED BEADS 120 MG PO CP24
120.0000 mg | ORAL_CAPSULE | Freq: Every day | ORAL | 3 refills | Status: DC
Start: 2021-05-10 — End: 2021-06-08

## 2021-05-10 NOTE — Progress Notes (Signed)
Subjective:   [name], female    DOB: 1945-12-12, 75 y.o.   MRN: [id number]   Chief Complaint  Patient presents with   Hypertension   Follow-up    3 months     75 year old Caucasian female with hypertension, persistent atrial fibrillation, moderate obesity, HFpEF, pericardial effusion s/p pericardiocentesis (01/2021), here for hospital f/u visit  Patient is here with her daughter today. She is doing well. Leg swelling has resolved. She has lost 20 lbs. Breathing is improved. Heart rate is elevated.   Current Outpatient Medications on File Prior to Visit  Medication Sig Dispense Refill   acetaminophen (TYLENOL) 325 MG tablet Take 325-650 mg by mouth at bedtime as needed for mild pain.     Calcium Carb-Cholecalciferol (CALTRATE 600+D3 PO) Take 1 tablet by mouth every evening.     [employer]S IRON 325 (65 Fe) MG tablet Take 325 mg by mouth daily at 6 PM.     ELIQUIS 5 MG TABS tablet TAKE 1 TABLET BY MOUTH TWICE A DAY (Patient taking differently: Take 5 mg by mouth 2 (two) times daily.) 60 tablet 5   furosemide (LASIX) 40 MG tablet TAKE 1 TABLET BY MOUTH EVERY DAY 90 tablet 1   levothyroxine (SYNTHROID, LEVOTHROID) 125 MCG tablet Take 125 mcg by mouth daily before breakfast.     magic mouthwash SOLN Take 5 mLs by mouth See admin instructions. Swish 5 ml's in the mouth, then expectorate, four times a day FOR 9 DAYS     metoprolol tartrate (LOPRESSOR) 50 MG tablet Take 1 tablet (50 mg total) by mouth 2 (two) times daily. 180 tablet 2   omeprazole (PRILOSEC) 20 MG capsule TAKE 1 CAPSULE BY MOUTH EVERY DAY (Patient taking differently: Take 20 mg by mouth daily before breakfast.) 90 capsule 1   pravastatin (PRAVACHOL) 80 MG tablet Take 80 mg by mouth in the morning.     spironolactone (ALDACTONE) 25 MG tablet Take 25 mg by mouth daily.     vitamin B-12 (CYANOCOBALAMIN) 1000 MCG tablet Take 1,000 mcg by mouth daily.     No current facility-administered medications on file prior to visit.     Cardiovascular and other pertinent studies:  Echocardiogram 02/07/2021:  Left ventricle cavity is normal in size and wall thickness. Normal LV  systolic function with visual EF 50-55%. Normal global wall motion. Unable  to evaluate diastolic function due to atrial fibrillation.  Left atrial cavity is moderately dilated.  Structurally normal trileaflet aortic valve. Mild aortic valve stenosis.  Vmax 1.8 m/sec, mean PG 9 mmHg, AVA 1.1 cm2 by continuity equation  Mild tricuspid regurgitation.  No evidence of pulmonary hypertension.  No significant change compared to previous study on 01/25/2021.   EKG 02/01/2021: Atrial fibrillation 79 bpm Low voltage in precordial leads Anterior infarct -age undetermined Nonspecific ST-T changes. Low suspicion for dig toxicity  CXT 01/12/2019: Slowly resolving congestive failure pattern. Possible superimposed pneumonia in LLL  EKG 12/25/2020: Atrial fibrillation 77 bpm  Recent labs: 02/01/2021: Glucose 83, BUN/Cr 12/1.14. EGFR 50. Na/K 137/3.7. Albumin 3.3. Rest of the CMP normal H/H 10.8/35.9. MCV 71.7. Platelets 356 HbA1C N/A Lipid panel N/A TSH N/A  01/30/2021: BNP 429  01/12/2021: Glucose 67, BUN/Cr 26/1.2. EGFR 43. Na/K 134/4.4. Rest of the CMP normal  12/25/2020: BNP 265  09/14/2019: Glucose 108, BUN/Cr 16/1.4. EGFR ?. Na/K ?/?.  Rest of the CMP normal.  Chol 183, TG 123, HDL 47, LDL 111    Review of Systems  Cardiovascular:  Positive for dyspnea on exertion (Improved). Negative for chest pain, leg swelling, palpitations and syncope.  Respiratory:  Positive for shortness of breath (Improved).   Gastrointestinal:  Positive for nausea. Negative for abdominal pain, diarrhea and vomiting.       Vitals:   05/10/21 1446  BP: 112/73  Pulse: (!) 123  Resp: 16  Temp: 98.6 F (37 C)  SpO2: 98%    Body mass index is 35.87 kg/m.   Objective:   Physical Exam Vitals and nursing note reviewed.  Constitutional:      Appearance:  She is well-developed.  Neck:     Vascular: No JVD.  Cardiovascular:     Rate and Rhythm: Normal rate. Rhythm irregular.     Pulses: Intact distal pulses.     Heart sounds: Normal heart sounds. No murmur heard. Pulmonary:     Effort: No respiratory distress.     Breath sounds: No wheezing or rales.  Musculoskeletal:     Right lower leg: Edema (Trace) present.     Left lower leg: Edema (Trace) present.        Assessment & Recommendations:   75 year old Caucasian female with hypertension, persistent atrial fibrillation, moderate obesity, HFpEF  Chronic HFpEF:. Relatively stable. Continue spironolactone 25 mg daily, lasix 40 mg daily Recommend daily weight checks, low salt diet.  Persistent Afib: Rate elevated. Continue metoprolol tartrate to 50 mg bid. Started diltiazem 120 mg daily. CHA2DS2VASc score 3: Annual stroke risk 4% Continue eliquis 5 mg bid.   Pericardial effusion: S/p pericardiocentesis 01/2021 No recurrence  Hypertension: Controlled  F/u in 4 weeks  Erielle Gawronski Esther Hardy, MD [facility] Pager: [phone] Office: [phone]

## 2021-05-17 ENCOUNTER — Ambulatory Visit: Payer: Medicare HMO | Admitting: Cardiology

## 2021-06-08 ENCOUNTER — Ambulatory Visit: Payer: Medicare HMO | Admitting: Cardiology

## 2021-06-08 ENCOUNTER — Other Ambulatory Visit: Payer: Self-pay

## 2021-06-08 ENCOUNTER — Encounter: Payer: Self-pay | Admitting: Cardiology

## 2021-06-08 VITALS — BP 91/59 | HR 98 | Temp 96.8°F | Resp 16 | Ht 64.0 in | Wt 207.0 lb

## 2021-06-08 DIAGNOSIS — I5032 Chronic diastolic (congestive) heart failure: Secondary | ICD-10-CM | POA: Diagnosis not present

## 2021-06-08 DIAGNOSIS — I10 Essential (primary) hypertension: Secondary | ICD-10-CM

## 2021-06-08 DIAGNOSIS — I4811 Longstanding persistent atrial fibrillation: Secondary | ICD-10-CM | POA: Diagnosis not present

## 2021-06-08 MED ORDER — APIXABAN 5 MG PO TABS: 5.0000 mg | ORAL_TABLET | Freq: Two times a day (BID) | ORAL | 5 refills | Status: DC

## 2021-06-08 MED ORDER — SPIRONOLACTONE 25 MG PO TABS: 25.0000 mg | ORAL_TABLET | Freq: Every day | ORAL | 2 refills | Status: DC

## 2021-06-08 MED ORDER — METOPROLOL TARTRATE 50 MG PO TABS: 50.0000 mg | ORAL_TABLET | Freq: Two times a day (BID) | ORAL | 2 refills | Status: DC

## 2021-06-08 MED ORDER — DILTIAZEM HCL ER COATED BEADS 120 MG PO CP24
120.0000 mg | ORAL_CAPSULE | Freq: Every day | ORAL | 3 refills | Status: DC
Start: 2021-06-08 — End: 2022-01-01

## 2021-06-08 NOTE — Progress Notes (Signed)
Subjective:   [name], female    DOB: 01/19/1946, 75 y.o.   MRN: [id number]   Chief Complaint  Patient presents with   Atrial Fibrillation   Hypertension   Follow-up    75 year old Caucasian female with hypertension, persistent atrial fibrillation, moderate obesity, HFpEF, pericardial effusion s/p pericardiocentesis (01/2021), here for hospital f/u visit  Patient is here with her daughter today. Patient is doing very well. Heart rate (as monitored by her smartwatch ) is reduced from 100s to 70s. She denies any shortness of breath, leg edema. Blood pressure is low normal, but she denies any lightheadedness.  Current Outpatient Medications on File Prior to Visit  Medication Sig Dispense Refill   acetaminophen (TYLENOL) 325 MG tablet Take 325-650 mg by mouth at bedtime as needed for mild pain.     Calcium Carb-Cholecalciferol (CALTRATE 600+D3 PO) Take 1 tablet by mouth every evening.     CVS IRON 325 (65 Fe) MG tablet Take 325 mg by mouth daily at 6 PM.     diltiazem (CARDIZEM CD) 120 MG 24 hr capsule Take 1 capsule (120 mg total) by mouth daily. 30 capsule 3   ELIQUIS 5 MG TABS tablet TAKE 1 TABLET BY MOUTH TWICE A DAY (Patient taking differently: Take 5 mg by mouth 2 (two) times daily.) 60 tablet 5   furosemide (LASIX) 40 MG tablet TAKE 1 TABLET BY MOUTH EVERY DAY 90 tablet 1   levothyroxine (SYNTHROID, LEVOTHROID) 125 MCG tablet Take 125 mcg by mouth daily before breakfast.     metoprolol tartrate (LOPRESSOR) 50 MG tablet Take 1 tablet (50 mg total) by mouth 2 (two) times daily. 180 tablet 2   omeprazole (PRILOSEC) 20 MG capsule TAKE 1 CAPSULE BY MOUTH EVERY DAY (Patient taking differently: Take 20 mg by mouth daily before breakfast.) 90 capsule 1   pravastatin (PRAVACHOL) 80 MG tablet Take 80 mg by mouth in the morning.     spironolactone (ALDACTONE) 25 MG tablet Take 25 mg by mouth daily.     vitamin B-12 (CYANOCOBALAMIN) 1000 MCG tablet Take 1,000 mcg by mouth daily.     No  current facility-administered medications on file prior to visit.    Cardiovascular and other pertinent studies:  Echocardiogram 02/07/2021:  Left ventricle cavity is normal in size and wall thickness. Normal LV  systolic function with visual EF 50-55%. Normal global wall motion. Unable  to evaluate diastolic function due to atrial fibrillation.  Left atrial cavity is moderately dilated.  Structurally normal trileaflet aortic valve. Mild aortic valve stenosis.  Vmax 1.8 m/sec, mean PG 9 mmHg, AVA 1.1 cm2 by continuity equation  Mild tricuspid regurgitation.  No evidence of pulmonary hypertension.  No significant change compared to previous study on 01/25/2021.   EKG 02/01/2021: Atrial fibrillation 79 bpm Low voltage in precordial leads Anterior infarct -age undetermined Nonspecific ST-T changes. Low suspicion for dig toxicity  CXT 01/12/2019: Slowly resolving congestive failure pattern. Possible superimposed pneumonia in LLL  EKG 12/25/2020: Atrial fibrillation 77 bpm  Recent labs: 02/01/2021: Glucose 83, BUN/Cr 12/1.14. EGFR 50. Na/K 137/3.7. Albumin 3.3. Rest of the CMP normal H/H 10.8/35.9. MCV 71.7. Platelets 356 HbA1C N/A Lipid panel N/A TSH N/A  01/30/2021: BNP 429  01/12/2021: Glucose 67, BUN/Cr 26/1.2. EGFR 43. Na/K 134/4.4. Rest of the CMP normal  12/25/2020: BNP 265  09/14/2019: Glucose 108, BUN/Cr 16/1.4. EGFR ?. Na/K ?/?.  Rest of the CMP normal.  Chol 183, TG 123, HDL 47, LDL 111  Review of Systems  Cardiovascular:  Negative for chest pain, dyspnea on exertion, leg swelling, palpitations and syncope.  Respiratory:  Negative for shortness of breath.   Gastrointestinal:  Positive for nausea. Negative for abdominal pain, diarrhea and vomiting.       Vitals:   06/08/21 1043  BP: (!) 91/59  Pulse: 98  Resp: 16  Temp: (!) 96.8 F (36 C)  SpO2: 98%    Body mass index is 35.53 kg/m.   Objective:   Physical Exam Vitals and nursing note reviewed.   Constitutional:      Appearance: She is well-developed.  Neck:     Vascular: No JVD.  Cardiovascular:     Rate and Rhythm: Normal rate. Rhythm irregular.     Pulses: Intact distal pulses.     Heart sounds: Normal heart sounds. No murmur heard. Pulmonary:     Effort: No respiratory distress.     Breath sounds: No wheezing or rales.  Musculoskeletal:     Right lower leg: No edema.     Left lower leg: No edema.        Assessment & Recommendations:   75 year old Caucasian female with hypertension, persistent atrial fibrillation, moderate obesity, HFpEF  Chronic HFpEF:. Relatively stable. Continue spironolactone 25 mg daily, lasix 40 mg daily Recommend daily weight checks, low salt diet.  Persistent Afib: Rate controlled on metoprolol tartrate 50 mg bid, diltiazem 120 mg daily. CHA2DS2VASc score 3: Annual stroke risk 4% Continue eliquis 5 mg bid.   Pericardial effusion: S/p pericardiocentesis 01/2021 No recurrence  Hypertension: Controlled. In future, if BP runs low, could consider reducing or stopping spironolactone.  F/u in 6 months  Janvi Ammar Esther Hardy, MD [facility] Pager: [phone] Office: [phone]

## 2021-06-19 ENCOUNTER — Inpatient Hospital Stay (HOSPITAL_COMMUNITY)
Admission: EM | Admit: 2021-06-19 | Discharge: 2021-06-22 | DRG: 683 | Disposition: A | Discharge status: Home / Self Care | Payer: Medicare HMO | Attending: Internal Medicine | Admitting: Internal Medicine

## 2021-06-19 ENCOUNTER — Other Ambulatory Visit: Payer: Self-pay

## 2021-06-19 ENCOUNTER — Telehealth: Payer: Self-pay

## 2021-06-19 ENCOUNTER — Encounter (HOSPITAL_COMMUNITY): Payer: Self-pay | Admitting: Emergency Medicine

## 2021-06-19 DIAGNOSIS — Z20822 Contact with and (suspected) exposure to covid-19: Secondary | ICD-10-CM | POA: Diagnosis present

## 2021-06-19 DIAGNOSIS — N179 Acute kidney failure, unspecified: Principal | ICD-10-CM | POA: Diagnosis present

## 2021-06-19 DIAGNOSIS — Z7689 Persons encountering health services in other specified circumstances: Secondary | ICD-10-CM | POA: Diagnosis not present

## 2021-06-19 DIAGNOSIS — E669 Obesity, unspecified: Secondary | ICD-10-CM | POA: Diagnosis present

## 2021-06-19 DIAGNOSIS — T502X5A Adverse effect of carbonic-anhydrase inhibitors, benzothiadiazides and other diuretics, initial encounter: Secondary | ICD-10-CM | POA: Diagnosis present

## 2021-06-19 DIAGNOSIS — E785 Hyperlipidemia, unspecified: Secondary | ICD-10-CM | POA: Diagnosis not present

## 2021-06-19 DIAGNOSIS — Z79890 Hormone replacement therapy (postmenopausal): Secondary | ICD-10-CM

## 2021-06-19 DIAGNOSIS — E875 Hyperkalemia: Secondary | ICD-10-CM | POA: Diagnosis present

## 2021-06-19 DIAGNOSIS — Z8249 Family history of ischemic heart disease and other diseases of the circulatory system: Secondary | ICD-10-CM

## 2021-06-19 DIAGNOSIS — R739 Hyperglycemia, unspecified: Secondary | ICD-10-CM | POA: Diagnosis not present

## 2021-06-19 DIAGNOSIS — N1832 Chronic kidney disease, stage 3b: Secondary | ICD-10-CM | POA: Diagnosis present

## 2021-06-19 DIAGNOSIS — I5032 Chronic diastolic (congestive) heart failure: Secondary | ICD-10-CM | POA: Diagnosis present

## 2021-06-19 DIAGNOSIS — K219 Gastro-esophageal reflux disease without esophagitis: Secondary | ICD-10-CM | POA: Diagnosis present

## 2021-06-19 DIAGNOSIS — Z7901 Long term (current) use of anticoagulants: Secondary | ICD-10-CM

## 2021-06-19 DIAGNOSIS — I4811 Longstanding persistent atrial fibrillation: Secondary | ICD-10-CM | POA: Diagnosis present

## 2021-06-19 DIAGNOSIS — I130 Hypertensive heart and chronic kidney disease with heart failure and stage 1 through stage 4 chronic kidney disease, or unspecified chronic kidney disease: Secondary | ICD-10-CM | POA: Diagnosis present

## 2021-06-19 DIAGNOSIS — E871 Hypo-osmolality and hyponatremia: Secondary | ICD-10-CM | POA: Diagnosis present

## 2021-06-19 DIAGNOSIS — Z96652 Presence of left artificial knee joint: Secondary | ICD-10-CM | POA: Diagnosis present

## 2021-06-19 DIAGNOSIS — E039 Hypothyroidism, unspecified: Secondary | ICD-10-CM | POA: Diagnosis present

## 2021-06-19 DIAGNOSIS — Z79899 Other long term (current) drug therapy: Secondary | ICD-10-CM

## 2021-06-19 DIAGNOSIS — I10 Essential (primary) hypertension: Secondary | ICD-10-CM | POA: Diagnosis not present

## 2021-06-19 DIAGNOSIS — E559 Vitamin D deficiency, unspecified: Secondary | ICD-10-CM | POA: Diagnosis not present

## 2021-06-19 LAB — URINALYSIS, ROUTINE W REFLEX MICROSCOPIC
Bilirubin Urine: NEGATIVE
Glucose, UA: NEGATIVE mg/dL
Hgb urine dipstick: NEGATIVE
Ketones, ur: NEGATIVE mg/dL
Nitrite: NEGATIVE
Protein, ur: NEGATIVE mg/dL
Specific Gravity, Urine: 1.009 (ref 1.005–1.030)
pH: 5 (ref 5.0–8.0)

## 2021-06-19 LAB — CBC WITH DIFFERENTIAL/PLATELET
Abs Immature Granulocytes: 0.03 10*3/uL (ref 0.00–0.07)
Basophils Absolute: 0.1 10*3/uL (ref 0.0–0.1)
Basophils Relative: 1 %
Eosinophils Absolute: 0.1 10*3/uL (ref 0.0–0.5)
Eosinophils Relative: 1 %
HCT: 45.5 % (ref 36.0–46.0)
Hemoglobin: 14.8 g/dL (ref 12.0–15.0)
Immature Granulocytes: 0 %
Lymphocytes Relative: 7 %
Lymphs Abs: 0.7 10*3/uL (ref 0.7–4.0)
MCH: 27.6 pg (ref 26.0–34.0)
MCHC: 32.5 g/dL (ref 30.0–36.0)
MCV: 84.7 fL (ref 80.0–100.0)
Monocytes Absolute: 0.7 10*3/uL (ref 0.1–1.0)
Monocytes Relative: 8 %
Neutro Abs: 7.8 10*3/uL — ABNORMAL HIGH (ref 1.7–7.7)
Neutrophils Relative %: 83 %
Platelets: 325 10*3/uL (ref 150–400)
RBC: 5.37 MIL/uL — ABNORMAL HIGH (ref 3.87–5.11)
RDW: 16.7 % — ABNORMAL HIGH (ref 11.5–15.5)
WBC: 9.4 10*3/uL (ref 4.0–10.5)
nRBC: 0 % (ref 0.0–0.2)

## 2021-06-19 LAB — BRAIN NATRIURETIC PEPTIDE: B Natriuretic Peptide: 188.9 pg/mL — ABNORMAL HIGH (ref 0.0–100.0)

## 2021-06-19 LAB — COMPREHENSIVE METABOLIC PANEL
ALT: 20 U/L (ref 0–44)
AST: 20 U/L (ref 15–41)
Albumin: 3.8 g/dL (ref 3.5–5.0)
Alkaline Phosphatase: 52 U/L (ref 38–126)
Anion gap: 9 (ref 5–15)
BUN: 61 mg/dL — ABNORMAL HIGH (ref 8–23)
CO2: 24 mmol/L (ref 22–32)
Calcium: 9.6 mg/dL (ref 8.9–10.3)
Chloride: 98 mmol/L (ref 98–111)
Creatinine, Ser: 2.66 mg/dL — ABNORMAL HIGH (ref 0.44–1.00)
GFR, Estimated: 18 mL/min — ABNORMAL LOW (ref >60)
Glucose, Bld: 101 mg/dL — ABNORMAL HIGH (ref 70–99)
Potassium: 5.4 mmol/L — ABNORMAL HIGH (ref 3.5–5.1)
Sodium: 131 mmol/L — ABNORMAL LOW (ref 135–145)
Total Bilirubin: 0.6 mg/dL (ref 0.3–1.2)
Total Protein: 7.2 g/dL (ref 6.5–8.1)

## 2021-06-19 LAB — PROTIME-INR
INR: 1.6 — ABNORMAL HIGH (ref 0.8–1.2)
Prothrombin Time: 19.3 seconds — ABNORMAL HIGH (ref 11.4–15.2)

## 2021-06-19 LAB — DIGOXIN LEVEL: Digoxin Level: <0.2 ng/mL — ABNORMAL LOW (ref 0.8–2.0)

## 2021-06-19 LAB — MAGNESIUM: Magnesium: 2.2 mg/dL (ref 1.7–2.4)

## 2021-06-19 MED ORDER — APIXABAN 5 MG PO TABS
5.0000 mg | ORAL_TABLET | Freq: Two times a day (BID) | ORAL | Status: DC
  Administered 2021-06-19 – 2021-06-22 (×6): 5 mg via ORAL
  Filled 2021-06-19 (×6): qty 1

## 2021-06-19 MED ORDER — CALCIUM CARBONATE-VITAMIN D 500-200 MG-UNIT PO TABS
ORAL_TABLET | Freq: Every evening | ORAL | Status: DC
  Administered 2021-06-20 – 2021-06-21 (×2): 1 via ORAL
  Filled 2021-06-19 (×3): qty 1

## 2021-06-19 MED ORDER — SODIUM ZIRCONIUM CYCLOSILICATE 10 G PO PACK
10.0000 g | PACK | ORAL | Status: AC
  Administered 2021-06-19: 10 g via ORAL
  Filled 2021-06-19: qty 1

## 2021-06-19 MED ORDER — PANTOPRAZOLE SODIUM 40 MG PO TBEC
40.0000 mg | DELAYED_RELEASE_TABLET | Freq: Every day | ORAL | Status: DC
  Administered 2021-06-20 – 2021-06-22 (×3): 40 mg via ORAL
  Filled 2021-06-19 (×4): qty 1

## 2021-06-19 MED ORDER — PRAVASTATIN SODIUM 40 MG PO TABS
80.0000 mg | ORAL_TABLET | Freq: Every day | ORAL | Status: DC
  Administered 2021-06-20 – 2021-06-21 (×2): 80 mg via ORAL
  Filled 2021-06-19 (×2): qty 2

## 2021-06-19 MED ORDER — LEVOTHYROXINE SODIUM 25 MCG PO TABS
125.0000 ug | ORAL_TABLET | Freq: Every day | ORAL | Status: DC
  Administered 2021-06-20 – 2021-06-22 (×3): 125 ug via ORAL
  Filled 2021-06-19 (×3): qty 1

## 2021-06-19 MED ORDER — VITAMIN B-12 1000 MCG PO TABS
1000.0000 ug | ORAL_TABLET | Freq: Every day | ORAL | Status: DC
  Administered 2021-06-20 – 2021-06-22 (×3): 1000 ug via ORAL
  Filled 2021-06-19 (×3): qty 1

## 2021-06-19 MED ORDER — ENOXAPARIN SODIUM 30 MG/0.3ML IJ SOSY: 30.0000 mg | PREFILLED_SYRINGE | INTRAMUSCULAR | Status: DC

## 2021-06-19 MED ORDER — FERROUS SULFATE 325 (65 FE) MG PO TABS
325.0000 mg | ORAL_TABLET | Freq: Every day | ORAL | Status: DC
  Administered 2021-06-20 – 2021-06-21 (×2): 325 mg via ORAL
  Filled 2021-06-19 (×2): qty 1

## 2021-06-19 NOTE — ED Provider Notes (Signed)
[place] EMERGENCY DEPARTMENT Provider Note   CSN: [id number] Arrival date & time: 06/19/21  1429     History Chief Complaint  Patient presents with   hyperkalemia    [name] is a 75 y.o. female presenting for evaluation of abnormal labs.  Patient states she is very well, has not been feeling poor recently.  She had labs done yesterday in preparation for a regular well check next week.  Labs show elevated kidney serum creatinine and elevated potassium.  Patient was told to come to the ER for further evaluation.  She states she was started on a medication recently, but does not remember what it was.  She thinks it might of been to get fluid off.  She has had no other medication changes in the past couple months.  She states she has been watching her diet, eating low-salt and low sugar diet.  She has been eating and drinking well.  She denies fevers, chills, chest pain, shortness breath, body aches, nausea, vomiting, abdominal pain, urinary symptoms, abnormal bowel movements.  Additional history obtained from chart review.  Patient with a history of A. fib on anticoagulation, CHF, CKD, hyperlipidemia, hypertension, hypothyroidism   HPI     Past Medical History:  Diagnosis Date   Arthritis    Atrial fibrillation ([place])    CHF (congestive heart failure) ([place])    CKD (chronic kidney disease), stage III ([place])    Colon polyps    Dysrhythmia    new onset afib    Goiter    Hyperglycemia    Hyperlipidemia    Hypertension    Hypothyroidism    Morbid obesity ([place])    Osteopenia     Patient Active Problem List   Diagnosis Date Noted   Chronic heart failure with preserved ejection fraction ([place]) 05/10/2021   Nausea 02/01/2021   Pleural effusion    Pericardial effusion    CHF exacerbation ([place]) 01/22/2021   Left lower lobe pneumonia 01/22/2021   Acute respiratory failure with hypoxia ([place]) 01/22/2021   Anemia 01/22/2021   AKI (acute kidney injury)  ([place]) 01/22/2021   CKD (chronic kidney disease), stage III ([place]) 01/22/2021   Prolonged QT interval 01/22/2021   Acute on chronic diastolic heart failure ([place]) 12/25/2020   Leg edema 12/25/2020   Essential hypertension 12/24/2019   Longstanding persistent atrial fibrillation ([place]) 12/24/2019   Preop examination 12/24/2019   Total knee replacement status 05/29/2017    Past Surgical History:  Procedure Laterality Date   CARDIOVASCULAR STRESS TEST     COLONOSCOPY     IR THORACENTESIS ASP PLEURAL SPACE W/IMG GUIDE  01/26/2021   LUMBAR [place] SURGERY  2012   PERICARDIOCENTESIS N/A 01/24/2021   Procedure: PERICARDIOCENTESIS;  Surgeon: Adrian Prows, MD;  Location: [facility];  Service: Cardiovascular;  Laterality: N/A;   POLYPECTOMY     TOTAL KNEE ARTHROPLASTY Left 05/29/2017   Procedure: LEFT TOTAL KNEE ARTHROPLASTY;  Surgeon: Leandrew Koyanagi, MD;  Location: [facility];  Service: Orthopedics;  Laterality: Left;   TUBAL LIGATION  1977   UPPER GASTROINTESTINAL ENDOSCOPY  12/29/2019   US ECHOCARDIOGRAPHY       OB History   No obstetric history on file.     Family History  Problem Relation Age of Onset   CVA Mother    Heart disease Brother    Heart disease Father    Colon cancer Neg Hx    Stomach cancer Neg Hx    Colon polyps  Neg Hx    Esophageal cancer Neg Hx    Rectal cancer Neg Hx     Social History   Tobacco Use   Smoking status: Never   Smokeless tobacco: Never  Vaping Use   Vaping Use: Never used  Substance Use Topics   Alcohol use: No   Drug use: No    Home Medications Prior to Admission medications   Medication Sig Start Date End Date Taking? Authorizing Provider  acetaminophen (TYLENOL) 325 MG tablet Take 325-650 mg by mouth at bedtime as needed for mild pain.    [provider]  apixaban (ELIQUIS) 5 MG TABS tablet Take 1 tablet (5 mg total) by mouth 2 (two) times daily. 06/08/21   Patwardhan, Reynold Bowen, MD  Calcium Carb-Cholecalciferol (CALTRATE 600+D3 PO)  Take 1 tablet by mouth every evening.    [provider]  CVS IRON 325 (65 Fe) MG tablet Take 325 mg by mouth daily at 6 PM. 01/16/21   [provider]  diltiazem (CARDIZEM CD) 120 MG 24 hr capsule Take 1 capsule (120 mg total) by mouth daily. 06/08/21 09/06/21  Patwardhan, Reynold Bowen, MD  furosemide (LASIX) 40 MG tablet TAKE 1 TABLET BY MOUTH EVERY DAY 01/30/21   Patwardhan, Reynold Bowen, MD  levothyroxine (SYNTHROID, LEVOTHROID) 125 MCG tablet Take 125 mcg by mouth daily before breakfast. 06/19/12   [provider]  metoprolol tartrate (LOPRESSOR) 50 MG tablet Take 1 tablet (50 mg total) by mouth 2 (two) times daily. 06/08/21   Patwardhan, Reynold Bowen, MD  omeprazole (PRILOSEC) 20 MG capsule TAKE 1 CAPSULE BY MOUTH EVERY DAY Patient taking differently: Take 20 mg by mouth daily before breakfast. 12/22/20   Irene Shipper, MD  pravastatin (PRAVACHOL) 80 MG tablet Take 80 mg by mouth in the morning. 07/16/12   [provider]  spironolactone (ALDACTONE) 25 MG tablet Take 1 tablet (25 mg total) by mouth daily. 06/08/21   Patwardhan, Reynold Bowen, MD  vitamin B-12 (CYANOCOBALAMIN) 1000 MCG tablet Take 1,000 mcg by mouth daily. 01/16/21   [provider]    Allergies    Atorvastatin  Review of Systems   Review of Systems  All other systems reviewed and are negative.  Physical Exam Updated Vital Signs BP 118/76 (BP Location: Right Arm)   Pulse 61   Temp 98.1 F (36.7 C)   Resp 18   Ht 5\' 4"  (1.626 m)   Wt 94 kg   SpO2 100%   BMI 35.57 kg/m   Physical Exam Vitals and nursing note reviewed.  Constitutional:      General: She is not in acute distress.    Appearance: Normal appearance.     Comments: nontoxic  HENT:     Head: Normocephalic and atraumatic.  Eyes:     Conjunctiva/sclera: Conjunctivae normal.     Pupils: Pupils are equal, round, and reactive to light.  Cardiovascular:     Rate and Rhythm: Normal rate and regular rhythm.     Pulses: Normal pulses.   Pulmonary:     Effort: Pulmonary effort is normal. No respiratory distress.     Breath sounds: Normal breath sounds. No wheezing.     Comments: Speaking in full sentences.  Clear lung sounds in all fields. Abdominal:     General: There is no distension.     Palpations: Abdomen is soft. There is no mass.     Tenderness: There is no abdominal tenderness. There is no guarding or rebound.  Musculoskeletal:  General: Normal range of motion.     Cervical back: Normal range of motion and neck supple.  Skin:    General: Skin is warm and dry.     Capillary Refill: Capillary refill takes less than 2 seconds.  Neurological:     Mental Status: She is alert and oriented to person, place, and time.  Psychiatric:        Mood and Affect: Mood and affect normal.        Speech: Speech normal.        Behavior: Behavior normal.    ED Results / Procedures / Treatments   Labs (all labs ordered are listed, but only abnormal results are displayed) Labs Reviewed  COMPREHENSIVE METABOLIC PANEL - Abnormal; Notable for the following components:      Result Value   Sodium 131 (*)    Potassium 5.4 (*)    Glucose, Bld 101 (*)    BUN 61 (*)    Creatinine, Ser 2.66 (*)    GFR, Estimated 18 (*)    All other components within normal limits  CBC WITH DIFFERENTIAL/PLATELET - Abnormal; Notable for the following components:   RBC 5.37 (*)    RDW 16.7 (*)    Neutro Abs 7.8 (*)    All other components within normal limits  PROTIME-INR - Abnormal; Notable for the following components:   Prothrombin Time 19.3 (*)    INR 1.6 (*)    All other components within normal limits  URINALYSIS, ROUTINE W REFLEX MICROSCOPIC - Abnormal; Notable for the following components:   APPearance HAZY (*)    Leukocytes,Ua SMALL (*)    Bacteria, UA FEW (*)    All other components within normal limits  BRAIN NATRIURETIC PEPTIDE - Abnormal; Notable for the following components:   B Natriuretic Peptide 188.9 (*)    All other  components within normal limits  DIGOXIN LEVEL - Abnormal; Notable for the following components:   Digoxin Level <0.2 (*)    All other components within normal limits  SARS CORONAVIRUS 2 (TAT 6-24 HRS)  MAGNESIUM    EKG EKG Interpretation  Date/Time:  Tuesday June 19 2021 16:01:51 EDT Ventricular Rate:  65 PR Interval:    QRS Duration: 84 QT Interval:  418 QTC Calculation: 434 R Axis:   45 Text Interpretation: Atrial fibrillation Abnormal ECG No significant change since last tracing Confirmed by Deno Etienne [phone]) on 06/19/2021 5:36:59 PM  Radiology No results found.  Procedures Procedures   Medications Ordered in ED Medications - No data to display  ED Course  I have reviewed the triage vital signs and the nursing notes.  Pertinent labs & imaging results that were available during my care of the patient were reviewed by me and considered in my medical decision making (see chart for details).    MDM Rules/Calculators/A&P                           Patient presenting for evaluation of abnormal labs.  On exam, patient appears nontoxic.  She reports she has no symptoms.  Will repeat labs to check kidney function and potassium.  EKG ordered due to reported hyperkalemia.  Labs interpreted by me, shows mild hyperkalemia 5.4.  EKG without peaked T waves.  Kidney function is 2.6, more than double her baseline.  Otherwise, labs are reassuring.  Discussed findings with patient.  Discussed recommendation for admission due to AKI, pt is agreeable. Will call for admission.  Discussed with Dr. Nevada Crane from triad hospitalist service, pt to be admitted.   Final Clinical Impression(s) / ED Diagnoses Final diagnoses:  AKI (acute kidney injury) [facility])    Rx / [place] Orders ED Discharge Orders     None        Shanaya Schneck, PA-C 06/19/21 2020    Deno Etienne, DO 06/19/21 2026

## 2021-06-19 NOTE — ED Provider Notes (Signed)
Emergency Medicine Provider Triage Evaluation Note  [name] , a 75 y.o. female  was evaluated in triage.  Pt complains of who presents with concern for abnormal blood test per PCP today.  Comes paperwork saying the creatinine was 2.8 and potassium was elevated to 6.3.  No history of abnormalities in these blood tests in the past.  Patient is asymptomatic.Marland Kitchen  Review of Systems  Positive: None Negative: Chest pain, palpitations, shortness of breath nausea, vomiting, decrease in urine, dysuria, urinary frequency or urgency  Physical Exam  BP 108/62 (BP Location: Left Arm)   Pulse (!) 56   Temp 98.1 F (36.7 C)   Resp 15   Ht 5\' 4"  (1.626 m)   Wt 94 kg   SpO2 100%   BMI 35.57 kg/m  Gen:   Awake, no distress   Resp:  Normal effort  MSK:   Moves extremities without difficulty  Other:  Irregular heart rhythm, normal rate.  Lungs CTA B.  Medical Decision Making  Medically screening exam initiated at 4:43 PM.  Appropriate orders placed.  [name] was informed that the remainder of the evaluation will be completed by another provider, this initial triage assessment does not replace that evaluation, and the importance of remaining in the ED until their evaluation is complete.  This chart was dictated using voice recognition software, Dragon. Despite the best efforts of this provider to proofread and correct errors, errors may still occur which can change documentation meaning.    [name] 06/19/21 1644    [name], Patricia Allegra, MD 06/19/21 [phone]

## 2021-06-19 NOTE — Telephone Encounter (Signed)
No response needed :   Patient family member called just to inform you that patient has been admitted into the hospital today.

## 2021-06-19 NOTE — Telephone Encounter (Signed)
FYI

## 2021-06-19 NOTE — ED Triage Notes (Addendum)
Pt  had blood work done this morning, PCP called and told her to come to the ER for Cr 2.8 and K 6.3. Pt has no complaints, is on a diuretic.

## 2021-06-19 NOTE — H&P (Signed)
History and Physical  [name] LNL:[id number] DOB: 01/04/1946 DOA: 06/19/2021  Referring physician: Berneta Levins  PCP: Shon Baton, MD  Outpatient Specialists: Cardiology Patient coming from: Home.    Chief Complaint: Abnormal labs  HPI: [name] is a 75 y.o. female with medical history significant for chronic diastolic CHF, pericardial effusion status post pericardiocentesis and February 2022, persistent A. fib on Eliquis who presented to [facility] ED at the request of her PCP due to abnormal labs.  Patient went to her PCP today for lab work in anticipation for her routine checkup visit.  She states she was doing fine prior to this.  She was found to have elevated creatinine and hyperkalemia and was asked to go to the ED for further evaluation and management.  Started on spironolactone over a week ago.  Work-up in the ED revealed electrolyte disturbances, including hyponatremia, hyperkalemia and AKI.  TRH, hospitalist team, was asked to admit.  ED Course: Temperature 98.7.  BP 97/69, pulse 70, respiration rate 18, O2 saturation 99% on room air.  Lab studies remarkable for serum sodium 131, serum potassium 5.4, serum bicarb 24, glucose 101, BUN 61, creatinine 2.66, anion gap 9, LFTs normal.  GFR was 18.  BNP 188.  WBC 9.4, hemoglobin 14.8, MCV 84, platelet count 325.  Review of Systems: Review of systems as noted in the HPI. All other systems reviewed and are negative.   Past Medical History:  Diagnosis Date   Arthritis    Atrial fibrillation (HCC)    CHF (congestive heart failure) ([place])    CKD (chronic kidney disease), stage III ([place])    Colon polyps    Dysrhythmia    new onset afib    Goiter    Hyperglycemia    Hyperlipidemia    Hypertension    Hypothyroidism    Morbid obesity ([place])    Osteopenia    Past Surgical History:  Procedure Laterality Date   CARDIOVASCULAR STRESS TEST     COLONOSCOPY     IR THORACENTESIS ASP PLEURAL SPACE W/IMG GUIDE  01/26/2021   LUMBAR  [place] SURGERY  2012   PERICARDIOCENTESIS N/A 01/24/2021   Procedure: PERICARDIOCENTESIS;  Surgeon: Adrian Prows, MD;  Location: [facility];  Service: Cardiovascular;  Laterality: N/A;   POLYPECTOMY     TOTAL KNEE ARTHROPLASTY Left 05/29/2017   Procedure: LEFT TOTAL KNEE ARTHROPLASTY;  Surgeon: Leandrew Koyanagi, MD;  Location: [facility];  Service: Orthopedics;  Laterality: Left;   TUBAL LIGATION  1977   UPPER GASTROINTESTINAL ENDOSCOPY  12/29/2019   US ECHOCARDIOGRAPHY      Social History:  reports that she has never smoked. She has never used smokeless tobacco. She reports that she does not drink alcohol and does not use drugs.   Allergies  Allergen Reactions   Atorvastatin Other (See Comments)    Aches     Family History  Problem Relation Age of Onset   CVA Mother    Heart disease Brother    Heart disease Father    Colon cancer Neg Hx    Stomach cancer Neg Hx    Colon polyps Neg Hx    Esophageal cancer Neg Hx    Rectal cancer Neg Hx       Prior to Admission medications   Medication Sig Start Date End Date Taking? Authorizing Provider  acetaminophen (TYLENOL) 325 MG tablet Take 325-650 mg by mouth at bedtime as needed for mild pain.    [provider]  apixaban (  ELIQUIS) 5 MG TABS tablet Take 1 tablet (5 mg total) by mouth 2 (two) times daily. 06/08/21   Patwardhan, Reynold Bowen, MD  Calcium Carb-Cholecalciferol (CALTRATE 600+D3 PO) Take 1 tablet by mouth every evening.    [provider]  [facility] IRON 325 (65 Fe) MG tablet Take 325 mg by mouth daily at 6 PM. 01/16/21   [provider]  diltiazem (CARDIZEM CD) 120 MG 24 hr capsule Take 1 capsule (120 mg total) by mouth daily. 06/08/21 09/06/21  Patwardhan, Reynold Bowen, MD  furosemide (LASIX) 40 MG tablet TAKE 1 TABLET BY MOUTH EVERY DAY 01/30/21   Patwardhan, Reynold Bowen, MD  levothyroxine (SYNTHROID, LEVOTHROID) 125 MCG tablet Take 125 mcg by mouth daily before breakfast. 06/19/12   [provider]  metoprolol  tartrate (LOPRESSOR) 50 MG tablet Take 1 tablet (50 mg total) by mouth 2 (two) times daily. 06/08/21   Patwardhan, Reynold Bowen, MD  omeprazole (PRILOSEC) 20 MG capsule TAKE 1 CAPSULE BY MOUTH EVERY DAY Patient taking differently: Take 20 mg by mouth daily before breakfast. 12/22/20   Irene Shipper, MD  pravastatin (PRAVACHOL) 80 MG tablet Take 80 mg by mouth in the morning. 07/16/12   [provider]  spironolactone (ALDACTONE) 25 MG tablet Take 1 tablet (25 mg total) by mouth daily. 06/08/21   Patwardhan, Reynold Bowen, MD  vitamin B-12 (CYANOCOBALAMIN) 1000 MCG tablet Take 1,000 mcg by mouth daily. 01/16/21   [provider]    Physical Exam: BP 118/76 (BP Location: Right Arm)   Pulse 61   Temp 98.1 F (36.7 C)   Resp 18   Ht 5\' 4"  (1.626 m)   Wt 94 kg   SpO2 100%   BMI 35.57 kg/m   General: 75 y.o. year-old female well developed well nourished in no acute distress.  Alert and oriented x3. Cardiovascular: Regular rate and rhythm with no rubs or gallops.  No thyromegaly or JVD noted.  No lower extremity edema. 2/4 pulses in all 4 extremities. Respiratory: Clear to auscultation with no wheezes or rales. Good inspiratory effort. Abdomen: Soft nontender nondistended with normal bowel sounds x4 quadrants. Muskuloskeletal: No cyanosis, clubbing or edema noted bilaterally Neuro: CN II-XII intact, strength, sensation, reflexes Skin: No ulcerative lesions noted or rashes Psychiatry: Judgement and insight appear normal. Mood is appropriate for condition and setting          Labs on Admission:  Basic Metabolic Panel: Recent Labs  Lab 06/19/21 1639  NA 131*  K 5.4*  CL 98  CO2 24  GLUCOSE 101*  BUN 61*  CREATININE 2.66*  CALCIUM 9.6  MG 2.2   Liver Function Tests: Recent Labs  Lab 06/19/21 1639  AST 20  ALT 20  ALKPHOS 52  BILITOT 0.6  PROT 7.2  ALBUMIN 3.8   No results for input(s): LIPASE, AMYLASE in the last 168 hours. No results for input(s): AMMONIA in the last  168 hours. CBC: Recent Labs  Lab 06/19/21 1639  WBC 9.4  NEUTROABS 7.8*  HGB 14.8  HCT 45.5  MCV 84.7  PLT 325   Cardiac Enzymes: No results for input(s): CKTOTAL, CKMB, CKMBINDEX, TROPONINI in the last 168 hours.  BNP (last 3 results) Recent Labs    01/29/21 0109 01/30/21 0135 06/19/21 1639  BNP 358.2* 429.7* 188.9*    ProBNP (last 3 results) No results for input(s): PROBNP in the last 8760 hours.  CBG: No results for input(s): GLUCAP in the last 168 hours.  Radiological Exams on  Admission: No results found.  EKG: I independently viewed the EKG done and my findings are as followed: A. fib rate of 65.  Nonspecific ST-T changes.  QTc 434  Assessment/Plan Present on Admission:  AKI (acute kidney injury) ([place])  Active Problems:   AKI (acute kidney injury) ([place])  AKI suspect prerenal in the setting of diuretics. Presented with BUN 61, creatinine 2.66 with GFR of 18. At baseline creatinine is 1.1 with GFR greater than 60. Obtain ultrasound Monitor urine output Hold off home diuretics. Avoid nephrotoxic agents, dehydration and hypotension Gentle IV fluid hydration normal saline at 30 cc/h x 1 day. Repeat renal panel in the morning.  Hyperkalemia Serum potassium 5.4 Hold off on spironolactone. 1 dose of Lokelma 10 g x 1 to be administered. Repeat level in the morning  Permanent A. fib on Eliquis Resume Eliquis for CVA prevention Rate is currently controlled  Hypothyroidism Resume home levothyroxine  GERD Resume home PPI  Hyperlipidemia Resume home Pravachol  Chronic diastolic CHF History of pericardial effusion status post pericardiocentesis Cardiac medications on hold due to soft BPs to avoid hypotension Cardiology consulted to assist with the management.   DVT prophylaxis: Eliquis  Code Status: Full code.  Family Communication: None at bedside.  Disposition Plan: Admitted to telemetry medical.  Consults called: Cardiology  consulted  Admission status: Observation status.   Status is: Observation   Dispo:  Patient From: Home  Planned Disposition: Home possibly on 06/20/2021 or when cardiology signs off.  Medically stable for discharge: No      Kayleen Memos MD [facility] Pager [phone]  If 7PM-7AM, please contact night-coverage [email or web address] Password [facility]  06/19/2021, 8:48 PM

## 2021-06-20 ENCOUNTER — Encounter (HOSPITAL_COMMUNITY): Payer: Self-pay | Admitting: Internal Medicine

## 2021-06-20 ENCOUNTER — Observation Stay (HOSPITAL_COMMUNITY): Payer: Medicare HMO

## 2021-06-20 DIAGNOSIS — E875 Hyperkalemia: Secondary | ICD-10-CM | POA: Diagnosis not present

## 2021-06-20 DIAGNOSIS — Z20822 Contact with and (suspected) exposure to covid-19: Secondary | ICD-10-CM | POA: Diagnosis not present

## 2021-06-20 DIAGNOSIS — Z79899 Other long term (current) drug therapy: Secondary | ICD-10-CM | POA: Diagnosis not present

## 2021-06-20 DIAGNOSIS — I5032 Chronic diastolic (congestive) heart failure: Secondary | ICD-10-CM | POA: Diagnosis not present

## 2021-06-20 DIAGNOSIS — E669 Obesity, unspecified: Secondary | ICD-10-CM | POA: Diagnosis not present

## 2021-06-20 DIAGNOSIS — E785 Hyperlipidemia, unspecified: Secondary | ICD-10-CM | POA: Diagnosis not present

## 2021-06-20 DIAGNOSIS — N179 Acute kidney failure, unspecified: Secondary | ICD-10-CM | POA: Diagnosis not present

## 2021-06-20 DIAGNOSIS — Z96652 Presence of left artificial knee joint: Secondary | ICD-10-CM | POA: Diagnosis present

## 2021-06-20 DIAGNOSIS — N289 Disorder of kidney and ureter, unspecified: Secondary | ICD-10-CM | POA: Diagnosis not present

## 2021-06-20 DIAGNOSIS — Z79890 Hormone replacement therapy (postmenopausal): Secondary | ICD-10-CM | POA: Diagnosis not present

## 2021-06-20 DIAGNOSIS — E039 Hypothyroidism, unspecified: Secondary | ICD-10-CM | POA: Diagnosis not present

## 2021-06-20 DIAGNOSIS — I4821 Permanent atrial fibrillation: Secondary | ICD-10-CM

## 2021-06-20 DIAGNOSIS — E871 Hypo-osmolality and hyponatremia: Secondary | ICD-10-CM | POA: Diagnosis not present

## 2021-06-20 DIAGNOSIS — I10 Essential (primary) hypertension: Secondary | ICD-10-CM | POA: Diagnosis not present

## 2021-06-20 DIAGNOSIS — K8020 Calculus of gallbladder without cholecystitis without obstruction: Secondary | ICD-10-CM | POA: Diagnosis not present

## 2021-06-20 DIAGNOSIS — K219 Gastro-esophageal reflux disease without esophagitis: Secondary | ICD-10-CM | POA: Diagnosis present

## 2021-06-20 DIAGNOSIS — K7689 Other specified diseases of liver: Secondary | ICD-10-CM | POA: Diagnosis not present

## 2021-06-20 DIAGNOSIS — I130 Hypertensive heart and chronic kidney disease with heart failure and stage 1 through stage 4 chronic kidney disease, or unspecified chronic kidney disease: Secondary | ICD-10-CM | POA: Diagnosis not present

## 2021-06-20 DIAGNOSIS — N1831 Chronic kidney disease, stage 3a: Secondary | ICD-10-CM | POA: Diagnosis not present

## 2021-06-20 DIAGNOSIS — Z8249 Family history of ischemic heart disease and other diseases of the circulatory system: Secondary | ICD-10-CM | POA: Diagnosis not present

## 2021-06-20 DIAGNOSIS — I5033 Acute on chronic diastolic (congestive) heart failure: Secondary | ICD-10-CM | POA: Diagnosis not present

## 2021-06-20 DIAGNOSIS — T502X5A Adverse effect of carbonic-anhydrase inhibitors, benzothiadiazides and other diuretics, initial encounter: Secondary | ICD-10-CM | POA: Diagnosis present

## 2021-06-20 DIAGNOSIS — Z7901 Long term (current) use of anticoagulants: Secondary | ICD-10-CM | POA: Diagnosis not present

## 2021-06-20 DIAGNOSIS — N1832 Chronic kidney disease, stage 3b: Secondary | ICD-10-CM

## 2021-06-20 DIAGNOSIS — I4811 Longstanding persistent atrial fibrillation: Secondary | ICD-10-CM | POA: Diagnosis not present

## 2021-06-20 LAB — BASIC METABOLIC PANEL
Anion gap: 11 (ref 5–15)
BUN: 60 mg/dL — ABNORMAL HIGH (ref 8–23)
CO2: 20 mmol/L — ABNORMAL LOW (ref 22–32)
Calcium: 9.4 mg/dL (ref 8.9–10.3)
Chloride: 99 mmol/L (ref 98–111)
Creatinine, Ser: 2.49 mg/dL — ABNORMAL HIGH (ref 0.44–1.00)
GFR, Estimated: 20 mL/min — ABNORMAL LOW (ref >60)
Glucose, Bld: 96 mg/dL (ref 70–99)
Potassium: 4.7 mmol/L (ref 3.5–5.1)
Sodium: 130 mmol/L — ABNORMAL LOW (ref 135–145)

## 2021-06-20 LAB — MAGNESIUM: Magnesium: 2 mg/dL (ref 1.7–2.4)

## 2021-06-20 LAB — SARS CORONAVIRUS 2 (TAT 6-24 HRS): SARS Coronavirus 2: NEGATIVE

## 2021-06-20 LAB — PHOSPHORUS: Phosphorus: 4.6 mg/dL (ref 2.5–4.6)

## 2021-06-20 MED ORDER — SODIUM CHLORIDE 0.9 % IV SOLN: INTRAVENOUS | Status: DC

## 2021-06-20 NOTE — Progress Notes (Signed)
RN attempted report x1.  

## 2021-06-20 NOTE — Consult Note (Signed)
CARDIOLOGY CONSULT NOTE  Patient ID: [name] MRN: [id number] DOB/AGE: 07/13/1946 75 y.o.  Admit date: 06/19/2021 Referring Physician: [facility] Primary Physician: Dr. Shon Baton Reason for Consultation: Hyperkalemia  HPI:   75 year old Caucasian female with hypertension, persistent atrial fibrillation, moderate obesity, HFpEF, pericardial effusion s/p pericardiocentesis (01/2021), now with AKI/hyperkalemia  Patient was recently seen by me in office, and was doing quite well.  She had condition noticeable problems, and had significant improvement in her neck swelling and exertional dyspnea symptoms.  She underwent routine lab checks through her PCP Dr. Keane Police office on 06/18/2021.  Lab results were significant for creatinine 2.8 and potassium 6.3. She was sent to our emergency room for further evaluation.  She denies chest pain, shortness of breath, palpitations, leg edema, orthopnea, PND, TIA/syncope. She has occasional left flank pain, but denies hematuria, dysuria. K has since improved to 4.7, Cr to 2.49.   Past Medical History:  Diagnosis Date   Arthritis    Atrial fibrillation (HCC)    CHF (congestive heart failure) ([place])    CKD (chronic kidney disease), stage III ([place])    Colon polyps    Dysrhythmia    new onset afib    Goiter    Hyperglycemia    Hyperlipidemia    Hypertension    Hypothyroidism    Morbid obesity ([place])    Osteopenia      Past Surgical History:  Procedure Laterality Date   CARDIOVASCULAR STRESS TEST     COLONOSCOPY     IR THORACENTESIS ASP PLEURAL SPACE W/IMG GUIDE  01/26/2021   LUMBAR [place] SURGERY  2012   PERICARDIOCENTESIS N/A 01/24/2021   Procedure: PERICARDIOCENTESIS;  Surgeon: Adrian Prows, MD;  Location: [place] [facility];  Service: Cardiovascular;  Laterality: N/A;   POLYPECTOMY     TOTAL KNEE ARTHROPLASTY Left 05/29/2017   Procedure: LEFT TOTAL KNEE ARTHROPLASTY;  Surgeon: Leandrew Koyanagi, MD;  Location: [facility];  Service: Orthopedics;   Laterality: Left;   TUBAL LIGATION  1977   UPPER GASTROINTESTINAL ENDOSCOPY  12/29/2019   US ECHOCARDIOGRAPHY        Family History  Problem Relation Age of Onset   CVA Mother    Heart disease Brother    Heart disease Father    Colon cancer Neg Hx    Stomach cancer Neg Hx    Colon polyps Neg Hx    Esophageal cancer Neg Hx    Rectal cancer Neg Hx      Social History: Social History   Socioeconomic History   Marital status: Married    Spouse name: Not on file   Number of children: 2   Years of education: Not on file   Highest education level: Not on file  Occupational History   Occupation: retired  Tobacco Use   Smoking status: Never   Smokeless tobacco: Never  Vaping Use   Vaping Use: Never used  Substance and Sexual Activity   Alcohol use: No   Drug use: No   Sexual activity: Not on file  Other Topics Concern   Not on file  Social History Narrative   Not on file   Social Determinants of Health   Financial Resource Strain: Not on file  Food Insecurity: Not on file  Transportation Needs: Not on file  Physical Activity: Not on file  Stress: Not on file  Social Connections: Not on file  Intimate Partner Violence: Not on file     (Not in a hospital admission)  Review of Systems  Constitutional: Negative for decreased appetite, malaise/fatigue, weight gain and weight loss.  HENT:  Negative for congestion.   Eyes:  Negative for visual disturbance.  Cardiovascular:  Negative for chest pain, dyspnea on exertion, leg swelling, palpitations and syncope.  Respiratory:  Negative for cough.   Endocrine: Negative for cold intolerance.  Hematologic/Lymphatic: Does not bruise/bleed easily.  Skin:  Negative for itching and rash.  Musculoskeletal:  Positive for back pain. Negative for myalgias.  Gastrointestinal:  Negative for abdominal pain, nausea and vomiting.  Genitourinary:  Negative for dysuria.  Neurological:  Negative for dizziness and weakness.   Psychiatric/Behavioral:  The patient is not nervous/anxious.   All other systems reviewed and are negative.    Physical Exam: Physical Exam Vitals and nursing note reviewed.  Constitutional:      General: She is not in acute distress.    Appearance: She is well-developed.  HENT:     Head: Normocephalic and atraumatic.  Eyes:     Conjunctiva/sclera: Conjunctivae normal.     Pupils: Pupils are equal, round, and reactive to light.  Neck:     Vascular: No JVD.  Cardiovascular:     Rate and Rhythm: Normal rate. Rhythm irregular.     Pulses: Normal pulses and intact distal pulses.     Heart sounds: No murmur heard. Pulmonary:     Effort: Pulmonary effort is normal.     Breath sounds: Normal breath sounds. No wheezing or rales.  Abdominal:     General: Bowel sounds are normal.     Palpations: Abdomen is soft.     Tenderness: There is no rebound.  Musculoskeletal:        General: No tenderness. Normal range of motion.     Right lower leg: No edema.  Lymphadenopathy:     Cervical: No cervical adenopathy.  Skin:    General: Skin is warm and dry.  Neurological:     Mental Status: She is alert and oriented to person, place, and time.     Cranial Nerves: No cranial nerve deficit.     Labs:   Lab Results  Component Value Date   WBC 9.4 06/19/2021   HGB 14.8 06/19/2021   HCT 45.5 06/19/2021   MCV 84.7 06/19/2021   PLT 325 06/19/2021    Recent Labs  Lab 06/19/21 1639 06/20/21 0500  NA 131* 130*  K 5.4* 4.7  CL 98 99  CO2 24 20*  BUN 61* 60*  CREATININE 2.66* 2.49*  CALCIUM 9.6 9.4  PROT 7.2  --   BILITOT 0.6  --   ALKPHOS 52  --   ALT 20  --   AST 20  --   GLUCOSE 101* 96    Lipid Panel  No results found for: CHOL, TRIG, HDL, CHOLHDL, VLDL, LDLCALC  BNP (last 3 results) Recent Labs    01/29/21 0109 01/30/21 0135 06/19/21 1639  BNP 358.2* 429.7* 188.9*    HEMOGLOBIN A1C No results found for: HGBA1C, MPG  Cardiac Panel (last 3 results) No results  for input(s): CKTOTAL, CKMB, RELINDX in the last 8760 hours.  Invalid input(s): TROPONINHS  No results found for: CKTOTAL, CKMB, CKMBINDEX   TSH Recent Labs    01/23/21 0110  TSH 1.499      Radiology: US RENAL  Result Date: 06/20/2021 CLINICAL DATA:  Acute renal insufficiency. EXAM: RENAL / URINARY TRACT ULTRASOUND COMPLETE COMPARISON:  CT 12/04/2010. FINDINGS: Right Kidney: Renal measurements: 9.3 x 4.5 x 4.1 cm = volume:  90.5 mL. Increased echogenicity. Renal cortical thinning. No mass or hydronephrosis. Left Kidney: Renal measurements: 7.7 x 4.2 x 4.0 cm = volume: 68.5 mL. Increased echogenicity. Renal cortical thinning. No mass or hydronephrosis. Bladder: Appears normal for degree of bladder distention. Other: Incidental note is made of gallstones. IMPRESSION: 1. Increased renal echogenicity bilaterally consistent chronic medical renal disease. Bilateral renal cortical thinning. 2.  No acute abnormality.  No hydronephrosis or bladder distention. 3.  Incidental note made of gallstones. Electronically Signed   By: Marcello Moores  Register   On: 06/20/2021 05:03    Scheduled Meds:  apixaban  5 mg Oral BID   calcium-vitamin D   Oral QPM   ferrous sulfate  325 mg Oral q1800   levothyroxine  125 mcg Oral Q0600   pantoprazole  40 mg Oral Daily   pravastatin  80 mg Oral q1800   vitamin B-12  1,000 mcg Oral Daily   Continuous Infusions:  sodium chloride 30 mL/hr at 06/20/21 0611   PRN Meds:.  CARDIAC STUDIES:  EKG 06/19/2021: Atrial fibrillation 65 bpm  Echocardiogram 02/07/2021:  Left ventricle cavity is normal in size and wall thickness. Normal LV  systolic function with visual EF 50-55%. Normal global wall motion. Unable  to evaluate diastolic function due to atrial fibrillation.  Left atrial cavity is moderately dilated.  Structurally normal trileaflet aortic valve. Mild aortic valve stenosis.  Vmax 1.8 m/sec, mean PG 9 mmHg, AVA 1.1 cm2 by continuity equation  Mild tricuspid  regurgitation.  No evidence of pulmonary hypertension.  No significant change compared to previous study on 01/25/2021.    Assessment & Recommendations:  75 year old Caucasian female with hypertension, persistent atrial fibrillation, moderate obesity, HFpEF, pericardial effusion s/p pericardiocentesis (01/2021), now with AKI/hyperkalemia  Hyperkaliemia: Along with AKI and hyponatremia. Most likely due to spironolactone. Stopped spironolactone. Consider work-up for obstructive uropathy, as per primary team. She is not in for congestive heart failure.  BNP is mildly elevated, but much lower than her previous numbers. Okay to hydrate gently.  On discharge, consider torsemide 20 mg daily as tolerated, in place of spironolactone.  Chronic HFpEF: Relatively stable   Persistent Afib: Rate controlled on metoprolol tartrate 50 mg bid, diltiazem 120 mg daily. CHA2DS2VASc score 3: Annual stroke risk 4% Continue eliquis 5 mg bid.   Pericardial effusion: S/p pericardiocentesis 01/2021 No recurrence   Hypertension: Controlled.      Nigel Mormon, MD Pager: [phone] Office: [phone]

## 2021-06-20 NOTE — ED Notes (Signed)
RN attempted report x1.  

## 2021-06-20 NOTE — Progress Notes (Signed)
PROGRESS NOTE    [name]  KWI:[id number] DOB: 1946/09/24 DOA: 06/19/2021 PCP: Shon Baton, MD    Brief Narrative:  75 y/o female, sent to ED for abnormal labs. Noted to have AKI with elevated creatinine and hyperkalemia. Felt to be related to diuretics. Admitted for IV hydration and monitoring renal function.   Assessment & Plan:   Active Problems:   AKI (acute kidney injury) ([place])   AKI on CKD stage 3b -likely related to diuretics -Renal ultrasound does not demonstrate signs of obstructive uropathy -continue with IV fluids for another 24 hours -continue to hold diuretics -recheck labs in AM  Hyperkalemia -related to AKI and spironolactone -improved with hydration  Permanent A fib -anticoagulated with eliquis -HR stable  Hypothyroidism -continue on synthroid  GERD -continue on PPI  HLD -continue on statin  Chronic diastolic CHF -currently compensated -continue to hold diuretics -appreciate cardiology input    DVT prophylaxis: SCDs Start: 06/19/21 2036 apixaban (ELIQUIS) tablet 5 mg  Code Status: full code Family Communication: discussed with husband at the bedside Disposition Plan: Status is: Observation  The patient remains OBS appropriate and will d/c before 2 midnights.  Dispo:  Patient From: Home  Planned Disposition: Home  Medically stable for discharge: No        Consultants:  cardiology  Procedures:    Antimicrobials:      Subjective: She is feeling better. No vomiting or diarrhea  Objective: Vitals:   06/20/21 0930 06/20/21 0956 06/20/21 1000 06/20/21 1100  BP:   104/67 107/77  Pulse: 98  90 96  Resp:   20   Temp:  (!) 97.3 F (36.3 C)    TempSrc:  Oral    SpO2: 100%  99% 95%  Weight:      Height:       No intake or output data in the 24 hours ending 06/20/21 1258 Filed Weights   06/19/21 1603  Weight: 94 kg    Examination:  General exam: Appears calm and comfortable  Respiratory system: Clear to  auscultation. Respiratory effort normal. Cardiovascular system: S1 & S2 heard, RRR. No JVD, murmurs, rubs, gallops or clicks. No pedal edema. Gastrointestinal system: Abdomen is nondistended, soft and nontender. No organomegaly or masses felt. Normal bowel sounds heard. Central nervous system: Alert and oriented. No focal neurological deficits. Extremities: Symmetric 5 x 5 power. Skin: No rashes, lesions or ulcers Psychiatry: Judgement and insight appear normal. Mood & affect appropriate.     Data Reviewed: I have personally reviewed following labs and imaging studies  CBC: Recent Labs  Lab 06/19/21 1639  WBC 9.4  NEUTROABS 7.8*  HGB 14.8  HCT 45.5  MCV 84.7  PLT 242   Basic Metabolic Panel: Recent Labs  Lab 06/19/21 1639 06/20/21 0500  NA 131* 130*  K 5.4* 4.7  CL 98 99  CO2 24 20*  GLUCOSE 101* 96  BUN 61* 60*  CREATININE 2.66* 2.49*  CALCIUM 9.6 9.4  MG 2.2 2.0  PHOS  --  4.6   GFR: Estimated Creatinine Clearance: 21.7 mL/min (A) (by C-G formula based on SCr of 2.49 mg/dL (H)). Liver Function Tests: Recent Labs  Lab 06/19/21 1639  AST 20  ALT 20  ALKPHOS 52  BILITOT 0.6  PROT 7.2  ALBUMIN 3.8   No results for input(s): LIPASE, AMYLASE in the last 168 hours. No results for input(s): AMMONIA in the last 168 hours. Coagulation Profile: Recent Labs  Lab 06/19/21 1639  INR 1.6*   Cardiac  Enzymes: No results for input(s): CKTOTAL, CKMB, CKMBINDEX, TROPONINI in the last 168 hours. BNP (last 3 results) No results for input(s): PROBNP in the last 8760 hours. HbA1C: No results for input(s): HGBA1C in the last 72 hours. CBG: No results for input(s): GLUCAP in the last 168 hours. Lipid Profile: No results for input(s): CHOL, HDL, LDLCALC, TRIG, CHOLHDL, LDLDIRECT in the last 72 hours. Thyroid Function Tests: No results for input(s): TSH, T4TOTAL, FREET4, T3FREE, THYROIDAB in the last 72 hours. Anemia Panel: No results for input(s): VITAMINB12, FOLATE,  FERRITIN, TIBC, IRON, RETICCTPCT in the last 72 hours. Sepsis Labs: No results for input(s): PROCALCITON, LATICACIDVEN in the last 168 hours.  Recent Results (from the past 240 hour(s))  SARS CORONAVIRUS 2 (TAT 6-24 HRS) Nasopharyngeal Nasopharyngeal Swab     Status: None   Collection Time: 06/19/21  9:10 PM   Specimen: Nasopharyngeal Swab  Result Value Ref Range Status   SARS Coronavirus 2 NEGATIVE NEGATIVE Final    Comment: (NOTE) SARS-CoV-2 target nucleic acids are NOT DETECTED.  The SARS-CoV-2 RNA is generally detectable in upper and lower respiratory specimens during the acute phase of infection. Negative results do not preclude SARS-CoV-2 infection, do not rule out co-infections with other pathogens, and should not be used as the sole basis for treatment or other patient management decisions. Negative results must be combined with clinical observations, patient history, and epidemiological information. The expected result is Negative.  Fact Sheet for Patients: SugarRoll.be  Fact Sheet for Healthcare Providers: https://www.woods-mathews.com/  This test is not yet approved or cleared by the [place] FDA and  has been authorized for detection and/or diagnosis of SARS-CoV-2 by FDA under an Emergency Use Authorization (EUA). This EUA will remain  in effect (meaning this test can be used) for the duration of the COVID-19 declaration under Se ction 564(b)(1) of the Act, 21 U.S.C. section 360bbb-3(b)(1), unless the authorization is terminated or revoked sooner.  Performed at [facility], [place] [address], [place], [place] [address]          Radiology Studies: US RENAL  Result Date: 06/20/2021 CLINICAL DATA:  Acute renal insufficiency. EXAM: RENAL / URINARY TRACT ULTRASOUND COMPLETE COMPARISON:  CT 12/04/2010. FINDINGS: Right Kidney: Renal measurements: 9.3 x 4.5 x 4.1 cm = volume: 90.5 mL. Increased echogenicity. Renal  cortical thinning. No mass or hydronephrosis. Left Kidney: Renal measurements: 7.7 x 4.2 x 4.0 cm = volume: 68.5 mL. Increased echogenicity. Renal cortical thinning. No mass or hydronephrosis. Bladder: Appears normal for degree of bladder distention. Other: Incidental note is made of gallstones. IMPRESSION: 1. Increased renal echogenicity bilaterally consistent chronic medical renal disease. Bilateral renal cortical thinning. 2.  No acute abnormality.  No hydronephrosis or bladder distention. 3.  Incidental note made of gallstones. Electronically Signed   By: Marcello Moores  Register   On: 06/20/2021 05:03        Scheduled Meds:  apixaban  5 mg Oral BID   calcium-vitamin D   Oral QPM   ferrous sulfate  325 mg Oral q1800   levothyroxine  125 mcg Oral Q0600   pantoprazole  40 mg Oral Daily   pravastatin  80 mg Oral q1800   vitamin B-12  1,000 mcg Oral Daily   Continuous Infusions:  sodium chloride 30 mL/hr at 06/20/21 0611     LOS: 0 days    Time spent: 7mins    Kathie Dike, MD [facility]   If 7PM-7AM, please contact night-coverage [email or web address]  06/20/2021, 12:58 PM

## 2021-06-20 NOTE — Progress Notes (Signed)
PT Cancellation Note  Patient Details Name: [name] MRN: [id number] DOB: 15-Apr-1946   Cancelled Treatment:    Reason Eval/Treat Not Completed: PT screened, no needs identified, will sign off. Pt reports no physical therapy needs at this time, stating that she has ambulated for years with her walker and currently has no deficits limiting her mobility.    Garwin Brothers, SPT  06/20/2021, 6:03 PM

## 2021-06-20 NOTE — ED Notes (Signed)
Checked temp twice same on both side. Pt hands are really cold. Took away pt old blanket and gave pt 4 new warm blanket I or nurse will recheck pt temp in 44min to an hour.

## 2021-06-21 ENCOUNTER — Other Ambulatory Visit: Payer: Self-pay | Admitting: Internal Medicine

## 2021-06-21 DIAGNOSIS — N1831 Chronic kidney disease, stage 3a: Secondary | ICD-10-CM | POA: Diagnosis not present

## 2021-06-21 DIAGNOSIS — I5032 Chronic diastolic (congestive) heart failure: Secondary | ICD-10-CM | POA: Diagnosis not present

## 2021-06-21 DIAGNOSIS — N179 Acute kidney failure, unspecified: Secondary | ICD-10-CM | POA: Diagnosis not present

## 2021-06-21 DIAGNOSIS — I4821 Permanent atrial fibrillation: Secondary | ICD-10-CM | POA: Diagnosis not present

## 2021-06-21 LAB — BASIC METABOLIC PANEL
Anion gap: 7 (ref 5–15)
BUN: 49 mg/dL — ABNORMAL HIGH (ref 8–23)
CO2: 24 mmol/L (ref 22–32)
Calcium: 9.2 mg/dL (ref 8.9–10.3)
Chloride: 103 mmol/L (ref 98–111)
Creatinine, Ser: 2.27 mg/dL — ABNORMAL HIGH (ref 0.44–1.00)
GFR, Estimated: 22 mL/min — ABNORMAL LOW (ref >60)
Glucose, Bld: 108 mg/dL — ABNORMAL HIGH (ref 70–99)
Potassium: 4.7 mmol/L (ref 3.5–5.1)
Sodium: 134 mmol/L — ABNORMAL LOW (ref 135–145)

## 2021-06-21 MED ORDER — LACTATED RINGERS IV SOLN: INTRAVENOUS | Status: DC

## 2021-06-21 NOTE — Progress Notes (Signed)
PROGRESS NOTE    [name]  WNU:[id number] DOB: June 15, 1946 DOA: 06/19/2021 PCP: Shon Baton, MD    Brief Narrative:  75 y/o female, sent to ED for abnormal labs. Noted to have AKI with elevated creatinine and hyperkalemia. Felt to be related to diuretics. Admitted for IV hydration and monitoring renal function.   Assessment & Plan:   Active Problems:   AKI (acute kidney injury) ([place])   AKI on CKD stage 3b -Baseline creatinine 1.1 -admitted with creatinine of 2.6 -likely related to diuretics -Renal ultrasound does not demonstrate signs of obstructive uropathy -currently creatinine has improved to 2.2 -continue with IV fluids for another 24 hours -continue to hold diuretics -recheck labs in AM  Hyperkalemia -related to AKI and spironolactone -improved with hydration  Permanent A fib -anticoagulated with eliquis -HR stable  Hypothyroidism -continue on synthroid  GERD -continue on PPI  HLD -continue on statin  Chronic diastolic CHF -currently compensated -continue to hold diuretics -appreciate cardiology input    DVT prophylaxis: SCDs Start: 06/19/21 2036 apixaban (ELIQUIS) tablet 5 mg  Code Status: full code Family Communication: discussed with husband at the bedside Disposition Plan: Status is: Inpatient  The patient will require care spanning > 2 midnights and should be moved to inpatient because: IV treatments appropriate due to intensity of illness or inability to take PO  Dispo:  Patient From: Home  Planned Disposition: Home  Medically stable for discharge: No        Consultants:  cardiology  Procedures:    Antimicrobials:      Subjective: No dizziness or lightheadedness. Feeling better overall  Objective: Vitals:   06/20/21 2021 06/21/21 0037 06/21/21 0456 06/21/21 1125  BP: 112/79 121/77 121/82 (!) 112/97  Pulse: 80 80 78 67  Resp: 18 18 18 18   Temp: 98.4 F (36.9 C) 98 F (36.7 C) 98 F (36.7 C) 97.9 F (36.6 C)   TempSrc: Oral Oral Oral Oral  SpO2: 98% 97% 95% 97%  Weight:   92.8 kg   Height:        Intake/Output Summary (Last 24 hours) at 06/21/2021 1617 Last data filed at 06/20/2021 2000 Gross per 24 hour  Intake 554.71 ml  Output --  Net 554.71 ml   Filed Weights   06/19/21 1603 06/20/21 1800 06/21/21 0456  Weight: 94 kg 93.5 kg 92.8 kg    Examination: General exam: Alert, awake, oriented x 3 Respiratory system: Clear to auscultation. Respiratory effort normal. Cardiovascular system:RRR. No murmurs, rubs, gallops. Gastrointestinal system: Abdomen is nondistended, soft and nontender. No organomegaly or masses felt. Normal bowel sounds heard. Central nervous system: Alert and oriented. No focal neurological deficits. Extremities: No C/C/E, +pedal pulses Skin: No rashes, lesions or ulcers Psychiatry: Judgement and insight appear normal. Mood & affect appropriate.      Data Reviewed: I have personally reviewed following labs and imaging studies  CBC: Recent Labs  Lab 06/19/21 1639  WBC 9.4  NEUTROABS 7.8*  HGB 14.8  HCT 45.5  MCV 84.7  PLT 034   Basic Metabolic Panel: Recent Labs  Lab 06/19/21 1639 06/20/21 0500 06/21/21 0507  NA 131* 130* 134*  K 5.4* 4.7 4.7  CL 98 99 103  CO2 24 20* 24  GLUCOSE 101* 96 108*  BUN 61* 60* 49*  CREATININE 2.66* 2.49* 2.27*  CALCIUM 9.6 9.4 9.2  MG 2.2 2.0  --   PHOS  --  4.6  --    GFR: Estimated Creatinine Clearance: 23.6 mL/min (A) (by  C-G formula based on SCr of 2.27 mg/dL (H)). Liver Function Tests: Recent Labs  Lab 06/19/21 1639  AST 20  ALT 20  ALKPHOS 52  BILITOT 0.6  PROT 7.2  ALBUMIN 3.8   No results for input(s): LIPASE, AMYLASE in the last 168 hours. No results for input(s): AMMONIA in the last 168 hours. Coagulation Profile: Recent Labs  Lab 06/19/21 1639  INR 1.6*   Cardiac Enzymes: No results for input(s): CKTOTAL, CKMB, CKMBINDEX, TROPONINI in the last 168 hours. BNP (last 3 results) No results  for input(s): PROBNP in the last 8760 hours. HbA1C: No results for input(s): HGBA1C in the last 72 hours. CBG: No results for input(s): GLUCAP in the last 168 hours. Lipid Profile: No results for input(s): CHOL, HDL, LDLCALC, TRIG, CHOLHDL, LDLDIRECT in the last 72 hours. Thyroid Function Tests: No results for input(s): TSH, T4TOTAL, FREET4, T3FREE, THYROIDAB in the last 72 hours. Anemia Panel: No results for input(s): VITAMINB12, FOLATE, FERRITIN, TIBC, IRON, RETICCTPCT in the last 72 hours. Sepsis Labs: No results for input(s): PROCALCITON, LATICACIDVEN in the last 168 hours.  Recent Results (from the past 240 hour(s))  SARS CORONAVIRUS 2 (TAT 6-24 HRS) Nasopharyngeal Nasopharyngeal Swab     Status: None   Collection Time: 06/19/21  9:10 PM   Specimen: Nasopharyngeal Swab  Result Value Ref Range Status   SARS Coronavirus 2 NEGATIVE NEGATIVE Final    Comment: (NOTE) SARS-CoV-2 target nucleic acids are NOT DETECTED.  The SARS-CoV-2 RNA is generally detectable in upper and lower respiratory specimens during the acute phase of infection. Negative results do not preclude SARS-CoV-2 infection, do not rule out co-infections with other pathogens, and should not be used as the sole basis for treatment or other patient management decisions. Negative results must be combined with clinical observations, patient history, and epidemiological information. The expected result is Negative.  Fact Sheet for Patients: SugarRoll.be  Fact Sheet for Healthcare Providers: https://www.woods-mathews.com/  This test is not yet approved or cleared by the [place] FDA and  has been authorized for detection and/or diagnosis of SARS-CoV-2 by FDA under an Emergency Use Authorization (EUA). This EUA will remain  in effect (meaning this test can be used) for the duration of the COVID-19 declaration under Se ction 564(b)(1) of the Act, 21 U.S.C. section  360bbb-3(b)(1), unless the authorization is terminated or revoked sooner.  Performed at [facility], [place] [address] [place], [place] [address]          Radiology Studies: US RENAL  Result Date: 06/20/2021 CLINICAL DATA:  Acute renal insufficiency. EXAM: RENAL / URINARY TRACT ULTRASOUND COMPLETE COMPARISON:  CT 12/04/2010. FINDINGS: Right Kidney: Renal measurements: 9.3 x 4.5 x 4.1 cm = volume: 90.5 mL. Increased echogenicity. Renal cortical thinning. No mass or hydronephrosis. Left Kidney: Renal measurements: 7.7 x 4.2 x 4.0 cm = volume: 68.5 mL. Increased echogenicity. Renal cortical thinning. No mass or hydronephrosis. Bladder: Appears normal for degree of bladder distention. Other: Incidental note is made of gallstones. IMPRESSION: 1. Increased renal echogenicity bilaterally consistent chronic medical renal disease. Bilateral renal cortical thinning. 2.  No acute abnormality.  No hydronephrosis or bladder distention. 3.  Incidental note made of gallstones. Electronically Signed   By: Marcello Moores  Register   On: 06/20/2021 05:03        Scheduled Meds:  apixaban  5 mg Oral BID   calcium-vitamin D   Oral QPM   ferrous sulfate  325 mg Oral q1800   levothyroxine  125 mcg  Oral Q0600   pantoprazole  40 mg Oral Daily   pravastatin  80 mg Oral q1800   vitamin B-12  1,000 mcg Oral Daily   Continuous Infusions:  sodium chloride 75 mL/hr at 06/21/21 1415     LOS: 1 day    Time spent: 58mins    Kathie Dike, MD [facility]   If 7PM-7AM, please contact night-coverage [email or web address]  06/21/2021, 4:17 PM

## 2021-06-22 DIAGNOSIS — N179 Acute kidney failure, unspecified: Secondary | ICD-10-CM | POA: Diagnosis not present

## 2021-06-22 DIAGNOSIS — N1832 Chronic kidney disease, stage 3b: Secondary | ICD-10-CM | POA: Diagnosis not present

## 2021-06-22 DIAGNOSIS — I5032 Chronic diastolic (congestive) heart failure: Secondary | ICD-10-CM | POA: Diagnosis not present

## 2021-06-22 DIAGNOSIS — I4821 Permanent atrial fibrillation: Secondary | ICD-10-CM | POA: Diagnosis not present

## 2021-06-22 LAB — BASIC METABOLIC PANEL
Anion gap: 7 (ref 5–15)
BUN: 36 mg/dL — ABNORMAL HIGH (ref 8–23)
CO2: 24 mmol/L (ref 22–32)
Calcium: 9.5 mg/dL (ref 8.9–10.3)
Chloride: 103 mmol/L (ref 98–111)
Creatinine, Ser: 1.79 mg/dL — ABNORMAL HIGH (ref 0.44–1.00)
GFR, Estimated: 29 mL/min — ABNORMAL LOW (ref >60)
Glucose, Bld: 97 mg/dL (ref 70–99)
Potassium: 4.8 mmol/L (ref 3.5–5.1)
Sodium: 134 mmol/L — ABNORMAL LOW (ref 135–145)

## 2021-06-22 MED ORDER — DILTIAZEM HCL ER COATED BEADS 120 MG PO CP24
120.0000 mg | ORAL_CAPSULE | Freq: Every day | ORAL | Status: DC
  Administered 2021-06-22: 120 mg via ORAL
  Filled 2021-06-22: qty 1

## 2021-06-22 MED ORDER — METOPROLOL TARTRATE 50 MG PO TABS
50.0000 mg | ORAL_TABLET | Freq: Two times a day (BID) | ORAL | Status: DC
  Administered 2021-06-22: 50 mg via ORAL
  Filled 2021-06-22: qty 1

## 2021-06-22 NOTE — Progress Notes (Signed)
Pt discharged home with spouse, All belongings sent with pt. All d/c instructions and med instructions given and explained to pt. IV and tele removed. All questions and concerns addressed with pt/family.

## 2021-06-22 NOTE — Discharge Summary (Signed)
Physician Discharge Summary  [name] [id number] DOB: [birth year] DOA: 06/19/2021  PCP: Shon Baton, MD  Admit date: 06/19/2021 Discharge date: 06/22/2021  Admitted From: home Disposition:  home  Recommendations for Outpatient Follow-up:  Patient has follow up with pcp on Tuesday Repeat BMET on follow up visit Spironolactone discontinue Hold lasix until repeat BMET drawn on follow up Dr. Virgina Jock will arrange follow up with office and will help decide on resume lasix based on bmet results  Home Health: Equipment/Devices:  Discharge Condition: stable CODE STATUS:full code Diet recommendation: heart healthy  Brief/Interim Summary: 75 y/o female, sent to ED for abnormal labs. Noted to have AKI with elevated creatinine and hyperkalemia. Felt to be related to diuretics. Admitted for IV hydration and monitoring renal function.  Discharge Diagnoses:  Active Problems:   AKI (acute kidney injury) ([place])  AKI on CKD stage 3b -Baseline creatinine 1.1 -admitted with creatinine of 2.6 -likely related to diuretics -Renal ultrasound does not demonstrate signs of obstructive uropathy -currently creatinine has improved to 1.7 -she was treated with IV fluids -discussed with Dr. Virgina Jock who recommended to discontinue aldactone on discharge and to hold lasix until she has repeat BMET on Tuesday with pcp -he will arrange follow up with his office -continue to check daily weights and watch for development of edema   Hyperkalemia -related to AKI and spironolactone -improved with hydration   Permanent A fib -anticoagulated with eliquis -HR stable -resume metoprolol and diltiazem on discharge   Hypothyroidism -continue on synthroid   GERD -continue on PPI   HLD -continue on statin   Chronic diastolic CHF -currently compensated -continue to hold diuretics -appreciate cardiology input  Discharge Instructions  Discharge Instructions     Diet - low sodium heart healthy    Complete by: As directed    Increase activity slowly   Complete by: As directed       Allergies as of 06/22/2021       Reactions   Atorvastatin Other (See Comments)   Aches        Medication List     STOP taking these medications    furosemide 40 MG tablet Commonly known as: LASIX   spironolactone 25 MG tablet Commonly known as: ALDACTONE       TAKE these medications    acetaminophen 325 MG tablet Commonly known as: TYLENOL Take 325-650 mg by mouth at bedtime as needed for mild pain.   apixaban 5 MG Tabs tablet Commonly known as: Eliquis Take 1 tablet (5 mg total) by mouth 2 (two) times daily.   CALTRATE 600+D3 PO Take 1 tablet by mouth every evening.   [employer] Iron 325 (65 FE) MG tablet Generic drug: ferrous sulfate Take 325 mg by mouth daily at 6 PM.   diltiazem 120 MG 24 hr capsule Commonly known as: CARDIZEM CD Take 1 capsule (120 mg total) by mouth daily.   levothyroxine 125 MCG tablet Commonly known as: SYNTHROID Take 125 mcg by mouth daily before breakfast.   metoprolol tartrate 50 MG tablet Commonly known as: LOPRESSOR Take 1 tablet (50 mg total) by mouth 2 (two) times daily.   omeprazole 20 MG capsule Commonly known as: PRILOSEC TAKE 1 CAPSULE BY MOUTH EVERY DAY What changed:  how much to take when to take this   pravastatin 80 MG tablet Commonly known as: PRAVACHOL Take 80 mg by mouth in the morning.   vitamin B-12 1000 MCG tablet Commonly known as: CYANOCOBALAMIN Take 1,000 mcg by mouth daily.  Follow-up Information     Shon Baton, MD Follow up.   Specialty: Internal Medicine Why: follow up on tuesday as scheduled repeat blood test (BMET) on follow up Contact information: [place] [address] [phone]         Nigel Mormon, MD Follow up.   Specialties: Cardiology, Radiology Why: office will call you with appointment Contact information: [place] [address] [phone]                Allergies  Allergen Reactions   Atorvastatin Other (See Comments)    Aches     Consultations: Cardiology, Dr. Virgina Jock   Procedures/Studies: US RENAL  Result Date: 06/20/2021 CLINICAL DATA:  Acute renal insufficiency. EXAM: RENAL / URINARY TRACT ULTRASOUND COMPLETE COMPARISON:  CT 12/04/2010. FINDINGS: Right Kidney: Renal measurements: 9.3 x 4.5 x 4.1 cm = volume: 90.5 mL. Increased echogenicity. Renal cortical thinning. No mass or hydronephrosis. Left Kidney: Renal measurements: 7.7 x 4.2 x 4.0 cm = volume: 68.5 mL. Increased echogenicity. Renal cortical thinning. No mass or hydronephrosis. Bladder: Appears normal for degree of bladder distention. Other: Incidental note is made of gallstones. IMPRESSION: 1. Increased renal echogenicity bilaterally consistent chronic medical renal disease. Bilateral renal cortical thinning. 2.  No acute abnormality.  No hydronephrosis or bladder distention. 3.  Incidental note made of gallstones. Electronically Signed   By: Marcello Moores  Register   On: 06/20/2021 05:03      Subjective: Feels well, denies any shortness of breath, no edema  Discharge Exam: Vitals:   06/21/21 1125 06/21/21 1632 06/21/21 2330 06/22/21 0450  BP: (!) 112/97 102/67 (!) 131/94 125/77  Pulse: 67 71 (!) 107 (!) 108  Resp: '18 17 18 18  '$ Temp: 97.9 F (36.6 C) 98.7 F (37.1 C) 97.8 F (36.6 C) 97.6 F (36.4 C)  TempSrc: Oral Oral Oral Oral  SpO2: 97% 98% 100% 99%  Weight:    94.7 kg  Height:        General: Pt is alert, awake, not in acute distress Cardiovascular: RRR, S1/S2 +, no rubs, no gallops Respiratory: CTA bilaterally, no wheezing, no rhonchi Abdominal: Soft, NT, ND, bowel sounds + Extremities: no edema, no cyanosis    The results of significant diagnostics from this hospitalization (including imaging, microbiology, ancillary and laboratory) are listed below for reference.     Microbiology: Recent  Results (from the past 240 hour(s))  SARS CORONAVIRUS 2 (TAT 6-24 HRS) Nasopharyngeal Nasopharyngeal Swab     Status: None   Collection Time: 06/19/21  9:10 PM   Specimen: Nasopharyngeal Swab  Result Value Ref Range Status   SARS Coronavirus 2 NEGATIVE NEGATIVE Final    Comment: (NOTE) SARS-CoV-2 target nucleic acids are NOT DETECTED.  The SARS-CoV-2 RNA is generally detectable in upper and lower respiratory specimens during the acute phase of infection. Negative results do not preclude SARS-CoV-2 infection, do not rule out co-infections with other pathogens, and should not be used as the sole basis for treatment or other patient management decisions. Negative results must be combined with clinical observations, patient history, and epidemiological information. The expected result is Negative.  Fact Sheet for Patients: SugarRoll.be  Fact Sheet for Healthcare Providers: https://www.woods-mathews.com/  This test is not yet approved or cleared by the [place] FDA and  has been authorized for detection and/or diagnosis of SARS-CoV-2 by FDA under an Emergency Use Authorization (EUA). This EUA will remain  in effect (meaning this test can be used) for  the duration of the COVID-19 declaration under Se ction 564(b)(1) of the Act, 21 U.S.C. section 360bbb-3(b)(1), unless the authorization is terminated or revoked sooner.  Performed at [facility], [place] [address] [place], [place] [address]      Labs: BNP (last 3 results) Recent Labs    01/29/21 0109 01/30/21 0135 06/19/21 1639  BNP 358.2* 429.7* 123XX123*   Basic Metabolic Panel: Recent Labs  Lab 06/19/21 1639 06/20/21 0500 06/21/21 0507 06/22/21 0455  NA 131* 130* 134* 134*  K 5.4* 4.7 4.7 4.8  CL 98 99 103 103  CO2 24 20* 24 24  GLUCOSE 101* 96 108* 97  BUN 61* 60* 49* 36*  CREATININE 2.66* 2.49* 2.27* 1.79*  CALCIUM 9.6 9.4 9.2 9.5  MG 2.2 2.0  --   --   PHOS  --   4.6  --   --    Liver Function Tests: Recent Labs  Lab 06/19/21 1639  AST 20  ALT 20  ALKPHOS 52  BILITOT 0.6  PROT 7.2  ALBUMIN 3.8   No results for input(s): LIPASE, AMYLASE in the last 168 hours. No results for input(s): AMMONIA in the last 168 hours. CBC: Recent Labs  Lab 06/19/21 1639  WBC 9.4  NEUTROABS 7.8*  HGB 14.8  HCT 45.5  MCV 84.7  PLT 325   Cardiac Enzymes: No results for input(s): CKTOTAL, CKMB, CKMBINDEX, TROPONINI in the last 168 hours. BNP: Invalid input(s): POCBNP CBG: No results for input(s): GLUCAP in the last 168 hours. D-Dimer No results for input(s): DDIMER in the last 72 hours. Hgb A1c No results for input(s): HGBA1C in the last 72 hours. Lipid Profile No results for input(s): CHOL, HDL, LDLCALC, TRIG, CHOLHDL, LDLDIRECT in the last 72 hours. Thyroid function studies No results for input(s): TSH, T4TOTAL, T3FREE, THYROIDAB in the last 72 hours.  Invalid input(s): FREET3 Anemia work up No results for input(s): VITAMINB12, FOLATE, FERRITIN, TIBC, IRON, RETICCTPCT in the last 72 hours. Urinalysis    Component Value Date/Time   COLORURINE YELLOW 06/19/2021 1844   APPEARANCEUR HAZY (A) 06/19/2021 1844   LABSPEC 1.009 06/19/2021 1844   PHURINE 5.0 06/19/2021 1844   GLUCOSEU NEGATIVE 06/19/2021 1844   HGBUR NEGATIVE 06/19/2021 1844   BILIRUBINUR NEGATIVE 06/19/2021 1844   KETONESUR NEGATIVE 06/19/2021 1844   PROTEINUR NEGATIVE 06/19/2021 1844   UROBILINOGEN 0.2 01/31/2011 1208   NITRITE NEGATIVE 06/19/2021 1844   LEUKOCYTESUR SMALL (A) 06/19/2021 1844   Sepsis Labs Invalid input(s): PROCALCITONIN,  WBC,  LACTICIDVEN Microbiology Recent Results (from the past 240 hour(s))  SARS CORONAVIRUS 2 (TAT 6-24 HRS) Nasopharyngeal Nasopharyngeal Swab     Status: None   Collection Time: 06/19/21  9:10 PM   Specimen: Nasopharyngeal Swab  Result Value Ref Range Status   SARS Coronavirus 2 NEGATIVE NEGATIVE Final    Comment: (NOTE) SARS-CoV-2  target nucleic acids are NOT DETECTED.  The SARS-CoV-2 RNA is generally detectable in upper and lower respiratory specimens during the acute phase of infection. Negative results do not preclude SARS-CoV-2 infection, do not rule out co-infections with other pathogens, and should not be used as the sole basis for treatment or other patient management decisions. Negative results must be combined with clinical observations, patient history, and epidemiological information. The expected result is Negative.  Fact Sheet for Patients: SugarRoll.be  Fact Sheet for Healthcare Providers: https://www.woods-mathews.com/  This test is not yet approved or cleared by the [place] FDA and  has been authorized for detection and/or diagnosis  of SARS-CoV-2 by FDA under an Emergency Use Authorization (EUA). This EUA will remain  in effect (meaning this test can be used) for the duration of the COVID-19 declaration under Se ction 564(b)(1) of the Act, 21 U.S.C. section 360bbb-3(b)(1), unless the authorization is terminated or revoked sooner.  Performed at [facility], [place] [address] [place], [place] [address]      Time coordinating discharge: 3mns  SIGNED:   JKathie Dike MD  [facility] 06/22/2021, 10:17 AM   If 7PM-7AM, please contact night-coverage [email or web address]

## 2021-06-26 DIAGNOSIS — D6869 Other thrombophilia: Secondary | ICD-10-CM | POA: Diagnosis not present

## 2021-06-26 DIAGNOSIS — E785 Hyperlipidemia, unspecified: Secondary | ICD-10-CM | POA: Diagnosis not present

## 2021-06-26 DIAGNOSIS — N1831 Chronic kidney disease, stage 3a: Secondary | ICD-10-CM | POA: Diagnosis not present

## 2021-06-26 DIAGNOSIS — Z1331 Encounter for screening for depression: Secondary | ICD-10-CM | POA: Diagnosis not present

## 2021-06-26 DIAGNOSIS — I130 Hypertensive heart and chronic kidney disease with heart failure and stage 1 through stage 4 chronic kidney disease, or unspecified chronic kidney disease: Secondary | ICD-10-CM | POA: Diagnosis not present

## 2021-06-26 DIAGNOSIS — I480 Paroxysmal atrial fibrillation: Secondary | ICD-10-CM | POA: Diagnosis not present

## 2021-06-26 DIAGNOSIS — E039 Hypothyroidism, unspecified: Secondary | ICD-10-CM | POA: Diagnosis not present

## 2021-06-26 DIAGNOSIS — I5022 Chronic systolic (congestive) heart failure: Secondary | ICD-10-CM | POA: Diagnosis not present

## 2021-06-26 DIAGNOSIS — N179 Acute kidney failure, unspecified: Secondary | ICD-10-CM | POA: Diagnosis not present

## 2021-06-26 DIAGNOSIS — I700 Atherosclerosis of aorta: Secondary | ICD-10-CM | POA: Diagnosis not present

## 2021-06-26 DIAGNOSIS — R82998 Other abnormal findings in urine: Secondary | ICD-10-CM | POA: Diagnosis not present

## 2021-06-26 DIAGNOSIS — Z1389 Encounter for screening for other disorder: Secondary | ICD-10-CM | POA: Diagnosis not present

## 2021-06-26 DIAGNOSIS — Z0000 Encounter for general adult medical examination without abnormal findings: Secondary | ICD-10-CM | POA: Diagnosis not present

## 2021-06-29 DIAGNOSIS — E785 Hyperlipidemia, unspecified: Secondary | ICD-10-CM | POA: Diagnosis not present

## 2021-07-04 DIAGNOSIS — E785 Hyperlipidemia, unspecified: Secondary | ICD-10-CM | POA: Diagnosis not present

## 2021-07-06 ENCOUNTER — Other Ambulatory Visit: Payer: Self-pay

## 2021-07-06 ENCOUNTER — Encounter: Payer: Self-pay | Admitting: Cardiology

## 2021-07-06 ENCOUNTER — Ambulatory Visit: Payer: Medicare HMO | Admitting: Cardiology

## 2021-07-06 VITALS — BP 121/75 | HR 75 | Temp 98.0°F | Resp 16 | Ht 66.0 in | Wt 208.0 lb

## 2021-07-06 DIAGNOSIS — I4811 Longstanding persistent atrial fibrillation: Secondary | ICD-10-CM | POA: Diagnosis not present

## 2021-07-06 DIAGNOSIS — I5032 Chronic diastolic (congestive) heart failure: Secondary | ICD-10-CM

## 2021-07-06 NOTE — Progress Notes (Signed)
Subjective:   [name], female    DOB: 1946-10-10, 75 y.o.   MRN: [id number]   Chief Complaint  Patient presents with   Atrial Fibrillation   Follow-up    75 year old Caucasian female with hypertension, persistent atrial fibrillation, moderate obesity, HFpEF, pericardial effusion s/p pericardiocentesis (01/2021), here for hospital f/u visit  Patient was hospitalized in 06/2021 with AKI and hyperkalemia, likely due to spironolactone. Spironolactone was thus stopped. She has been doing well since hospital discharge. Labs were checked by Dr. [place] [facility] office. Cr was stable at 1/79, K results not available to me. She has no leg edema. Weight is staying stable.   Current Outpatient Medications on File Prior to Visit  Medication Sig Dispense Refill   acetaminophen (TYLENOL) 325 MG tablet Take 325-650 mg by mouth at bedtime as needed for mild pain.     apixaban (ELIQUIS) 5 MG TABS tablet Take 1 tablet (5 mg total) by mouth 2 (two) times daily. 60 tablet 5   Calcium Carb-Cholecalciferol (CALTRATE 600+D3 PO) Take 1 tablet by mouth every evening.     [employer] IRON 325 (65 Fe) MG tablet Take 325 mg by mouth daily at 6 PM.     diltiazem (CARDIZEM CD) 120 MG 24 hr capsule Take 1 capsule (120 mg total) by mouth daily. 30 capsule 3   levothyroxine (SYNTHROID, LEVOTHROID) 125 MCG tablet Take 125 mcg by mouth daily before breakfast.     metoprolol tartrate (LOPRESSOR) 50 MG tablet Take 1 tablet (50 mg total) by mouth 2 (two) times daily. 180 tablet 2   omeprazole (PRILOSEC) 20 MG capsule TAKE 1 CAPSULE BY MOUTH EVERY DAY 90 capsule 1   pravastatin (PRAVACHOL) 80 MG tablet Take 80 mg by mouth in the morning.     vitamin B-12 (CYANOCOBALAMIN) 1000 MCG tablet Take 1,000 mcg by mouth daily.     No current facility-administered medications on file prior to visit.    Cardiovascular and other pertinent studies:  Echocardiogram 02/07/2021:  Left ventricle cavity is normal in size and wall thickness. Normal LV   systolic function with visual EF 50-55%. Normal global wall motion. Unable  to evaluate diastolic function due to atrial fibrillation.  Left atrial cavity is moderately dilated.  Structurally normal trileaflet aortic valve. Mild aortic valve stenosis.  Vmax 1.8 m/sec, mean PG 9 mmHg, AVA 1.1 cm2 by continuity equation  Mild tricuspid regurgitation.  No evidence of pulmonary hypertension.  No significant change compared to previous study on 01/25/2021.   EKG 02/01/2021: Atrial fibrillation 79 bpm Low voltage in precordial leads Anterior infarct -age undetermined Nonspecific ST-T changes. Low suspicion for dig toxicity  CXT 01/12/2019: Slowly resolving congestive failure pattern. Possible superimposed pneumonia in LLL  EKG 12/25/2020: Atrial fibrillation 77 bpm  Recent labs: 07/04/2021: Glucose 99, BUN/Cr 30/?. EGFR 29  06/22/2021: Glucose 97, BUN/Cr 36/1.79. EGFR 29. Na/K 134/4.8.  H/H 14/45. MCV 84. Platelets 325  02/01/2021: Glucose 83, BUN/Cr 12/1.14. EGFR 50. Na/K 137/3.7. Albumin 3.3. Rest of the CMP normal H/H 10.8/35.9. MCV 71.7. Platelets 356 HbA1C N/A Lipid panel N/A TSH N/A  01/30/2021: BNP 429  01/12/2021: Glucose 67, BUN/Cr 26/1.2. EGFR 43. Na/K 134/4.4. Rest of the CMP normal  12/25/2020: BNP 265  09/14/2019: Glucose 108, BUN/Cr 16/1.4. EGFR ?. Na/K ?/?.  Rest of the CMP normal.  Chol 183, TG 123, HDL 47, LDL 111    Review of Systems  Cardiovascular:  Negative for chest pain, dyspnea on exertion, leg swelling, palpitations and syncope.  Respiratory:  Negative for shortness of breath.   Gastrointestinal:  Positive for nausea. Negative for abdominal pain, diarrhea and vomiting.       Vitals:   07/06/21 1127  BP: 121/75  Pulse: 75  Resp: 16  Temp: 98 F (36.7 C)  SpO2: 94%    Body mass index is 33.57 kg/m.   Objective:   Physical Exam Vitals and nursing note reviewed.  Constitutional:      Appearance: She is well-developed.  Neck:      Vascular: No JVD.  Cardiovascular:     Rate and Rhythm: Normal rate. Rhythm irregular.     Pulses: Intact distal pulses.     Heart sounds: Normal heart sounds. No murmur heard. Pulmonary:     Effort: No respiratory distress.     Breath sounds: No wheezing or rales.  Musculoskeletal:     Right lower leg: No edema.     Left lower leg: No edema.        Assessment & Recommendations:   75-year-old Caucasian female with hypertension, persistent atrial fibrillation, moderate obesity, HFpEF  Chronic HFpEF:. Euvolumic. Not on diuretics, doing well Spironolactone stopped due to hyperkalemia and AKI  Persistent Afib: Rate controlled on metoprolol tartrate 50 mg bid, diltiazem 120 mg daily. CHA2DS2VASc score 3: Annual stroke risk 4% Continue eliquis 5 mg bid.   Pericardial effusion: S/p pericardiocentesis 01/2021 No recurrence  Hypertension: Controlled.  F/u in 3 months   J , MD [facility] Pager: [phone] Office: [phone] 

## 2021-07-25 DIAGNOSIS — E785 Hyperlipidemia, unspecified: Secondary | ICD-10-CM | POA: Diagnosis not present

## 2021-08-19 ENCOUNTER — Other Ambulatory Visit: Payer: Self-pay | Admitting: Cardiology

## 2021-08-19 DIAGNOSIS — R600 Localized edema: Secondary | ICD-10-CM

## 2021-08-20 ENCOUNTER — Other Ambulatory Visit: Payer: Self-pay

## 2021-08-20 DIAGNOSIS — I10 Essential (primary) hypertension: Secondary | ICD-10-CM

## 2021-08-20 MED ORDER — METOPROLOL TARTRATE 50 MG PO TABS: 50.0000 mg | ORAL_TABLET | Freq: Two times a day (BID) | ORAL | 2 refills | Status: DC

## 2021-08-30 DIAGNOSIS — H2513 Age-related nuclear cataract, bilateral: Secondary | ICD-10-CM | POA: Diagnosis not present

## 2021-09-21 DIAGNOSIS — R921 Mammographic calcification found on diagnostic imaging of breast: Secondary | ICD-10-CM | POA: Diagnosis not present

## 2021-09-21 DIAGNOSIS — N6312 Unspecified lump in the right breast, upper inner quadrant: Secondary | ICD-10-CM | POA: Diagnosis not present

## 2021-10-01 DIAGNOSIS — N1831 Chronic kidney disease, stage 3a: Secondary | ICD-10-CM | POA: Diagnosis not present

## 2021-10-01 DIAGNOSIS — M1990 Unspecified osteoarthritis, unspecified site: Secondary | ICD-10-CM | POA: Diagnosis not present

## 2021-10-01 DIAGNOSIS — E785 Hyperlipidemia, unspecified: Secondary | ICD-10-CM | POA: Diagnosis not present

## 2021-10-01 DIAGNOSIS — I480 Paroxysmal atrial fibrillation: Secondary | ICD-10-CM | POA: Diagnosis not present

## 2021-10-07 ENCOUNTER — Other Ambulatory Visit: Payer: Self-pay | Admitting: Cardiology

## 2021-10-07 DIAGNOSIS — R600 Localized edema: Secondary | ICD-10-CM

## 2021-10-08 ENCOUNTER — Other Ambulatory Visit: Payer: Self-pay

## 2021-10-08 ENCOUNTER — Ambulatory Visit: Payer: Medicare HMO | Admitting: Cardiology

## 2021-10-08 ENCOUNTER — Encounter: Payer: Self-pay | Admitting: Cardiology

## 2021-10-08 VITALS — BP 155/91 | HR 84 | Temp 97.8°F | Resp 17 | Ht 66.0 in | Wt 205.0 lb

## 2021-10-08 DIAGNOSIS — I5032 Chronic diastolic (congestive) heart failure: Secondary | ICD-10-CM

## 2021-10-08 DIAGNOSIS — I10 Essential (primary) hypertension: Secondary | ICD-10-CM

## 2021-10-08 DIAGNOSIS — I4811 Longstanding persistent atrial fibrillation: Secondary | ICD-10-CM | POA: Diagnosis not present

## 2021-10-08 NOTE — Progress Notes (Signed)
Subjective:   [name], female    DOB: 14-Oct-1946, 75 y.o.   MRN: [id number]   Chief Complaint  Patient presents with   Atrial Fibrillation   Hypertension   Follow-up    3 month    75 year old Caucasian female with hypertension, persistent atrial fibrillation, moderate obesity, HFpEF, pericardial effusion s/p pericardiocentesis (01/2021), here for hospital f/u visit  Patient is doing well. She denies chest pain, shortness of breath, palpitations, leg edema, orthopnea, PND, TIA/syncope. Blood pressure elevated today, usually always well controlled. She had recent labs with Dr. Revonda Standard not available to me. She has appt with Dr. Virgina Jock in two weeks.   Current Outpatient Medications on File Prior to Visit  Medication Sig Dispense Refill   acetaminophen (TYLENOL) 325 MG tablet Take 325-650 mg by mouth at bedtime as needed for mild pain.     apixaban (ELIQUIS) 5 MG TABS tablet Take 1 tablet (5 mg total) by mouth 2 (two) times daily. 60 tablet 5   Calcium Carb-Cholecalciferol (CALTRATE 600+D3 PO) Take 1 tablet by mouth every evening.     [employer] IRON 325 (65 Fe) MG tablet Take 325 mg by mouth daily at 6 PM.     diltiazem (CARDIZEM CD) 120 MG 24 hr capsule Take 1 capsule (120 mg total) by mouth daily. 30 capsule 3   levothyroxine (SYNTHROID, LEVOTHROID) 125 MCG tablet Take 125 mcg by mouth daily before breakfast.     metoprolol tartrate (LOPRESSOR) 50 MG tablet Take 1 tablet (50 mg total) by mouth 2 (two) times daily. 180 tablet 2   omeprazole (PRILOSEC) 20 MG capsule TAKE 1 CAPSULE BY MOUTH EVERY DAY 90 capsule 1   pravastatin (PRAVACHOL) 80 MG tablet Take 80 mg by mouth in the morning.     vitamin B-12 (CYANOCOBALAMIN) 1000 MCG tablet Take 1,000 mcg by mouth daily.     No current facility-administered medications on file prior to visit.    Cardiovascular and other pertinent studies:  Echocardiogram 02/07/2021:  Left ventricle cavity is normal in size and wall thickness. Normal LV   systolic function with visual EF 50-55%. Normal global wall motion. Unable  to evaluate diastolic function due to atrial fibrillation.  Left atrial cavity is moderately dilated.  Structurally normal trileaflet aortic valve. Mild aortic valve stenosis.  Vmax 1.8 m/sec, mean PG 9 mmHg, AVA 1.1 cm2 by continuity equation  Mild tricuspid regurgitation.  No evidence of pulmonary hypertension.  No significant change compared to previous study on 01/25/2021.   EKG 02/01/2021: Atrial fibrillation 79 bpm Low voltage in precordial leads Anterior infarct -age undetermined Nonspecific ST-T changes. Low suspicion for dig toxicity  CXT 01/12/2019: Slowly resolving congestive failure pattern. Possible superimposed pneumonia in LLL  EKG 12/25/2020: Atrial fibrillation 77 bpm  Recent labs: 07/04/2021: Glucose 99, BUN/Cr 30/?. EGFR 29  06/22/2021: Glucose 97, BUN/Cr 36/1.79. EGFR 29. Na/K 134/4.8.  H/H 14/45. MCV 84. Platelets 325  02/01/2021: Glucose 83, BUN/Cr 12/1.14. EGFR 50. Na/K 137/3.7. Albumin 3.3. Rest of the CMP normal H/H 10.8/35.9. MCV 71.7. Platelets 356 HbA1C N/A Lipid panel N/A TSH N/A  01/30/2021: BNP 429  01/12/2021: Glucose 67, BUN/Cr 26/1.2. EGFR 43. Na/K 134/4.4. Rest of the CMP normal  12/25/2020: BNP 265  09/14/2019: Glucose 108, BUN/Cr 16/1.4. EGFR ?. Na/K ?/?.  Rest of the CMP normal.  Chol 183, TG 123, HDL 47, LDL 111    Review of Systems  Cardiovascular:  Negative for chest pain, dyspnea on exertion, leg swelling, palpitations and syncope.  Respiratory:  Negative for shortness of breath.   Gastrointestinal:  Positive for nausea. Negative for abdominal pain, diarrhea and vomiting.       Vitals:   10/08/21 1044 10/08/21 1117  BP: (!) 150/79 (!) 155/91  Pulse: 92 84  Resp: 17   Temp: 97.8 F (36.6 C)   SpO2: 97%     Body mass index is 33.09 kg/m.   Objective:   Physical Exam Vitals and nursing note reviewed.  Constitutional:      Appearance: She  is well-developed.  Neck:     Vascular: No JVD.  Cardiovascular:     Rate and Rhythm: Normal rate. Rhythm irregular.     Pulses: Intact distal pulses.     Heart sounds: Normal heart sounds. No murmur heard. Pulmonary:     Effort: No respiratory distress.     Breath sounds: No wheezing or rales.  Musculoskeletal:     Right lower leg: No edema.     Left lower leg: No edema.        Assessment & Recommendations:   75 year old Caucasian female with hypertension, persistent atrial fibrillation, moderate obesity, HFpEF  Chronic HFpEF:. Euvolumic. Not on diuretics, doing well Spironolactone stopped due to hyperkalemia and AKI  Persistent Afib: Rate controlled on metoprolol tartrate 50 mg bid, diltiazem 120 mg daily. CHA2DS2VASc score 3: Annual stroke risk 4% Continue eliquis 5 mg bid.   Pericardial effusion: S/p pericardiocentesis 01/2021 No recurrence  Hypertension: BP elevated today. Usually well controlled. Has f/u w/Dr. Virgina Jock in two weeks. Can recheck at that visit. If SBP remains >140 mmHg, could add amlodipine 5 mg.   F/u in 6 months  Caterin Tabares Esther Hardy, MD [facility] Pager: [phone] Office: [phone]

## 2021-10-22 DIAGNOSIS — E039 Hypothyroidism, unspecified: Secondary | ICD-10-CM | POA: Diagnosis not present

## 2021-10-22 DIAGNOSIS — M1990 Unspecified osteoarthritis, unspecified site: Secondary | ICD-10-CM | POA: Diagnosis not present

## 2021-10-22 DIAGNOSIS — N1831 Chronic kidney disease, stage 3a: Secondary | ICD-10-CM | POA: Diagnosis not present

## 2021-10-22 DIAGNOSIS — R809 Proteinuria, unspecified: Secondary | ICD-10-CM | POA: Diagnosis not present

## 2021-10-22 DIAGNOSIS — D6869 Other thrombophilia: Secondary | ICD-10-CM | POA: Diagnosis not present

## 2021-10-22 DIAGNOSIS — I5022 Chronic systolic (congestive) heart failure: Secondary | ICD-10-CM | POA: Diagnosis not present

## 2021-10-22 DIAGNOSIS — E785 Hyperlipidemia, unspecified: Secondary | ICD-10-CM | POA: Diagnosis not present

## 2021-10-22 DIAGNOSIS — Z7901 Long term (current) use of anticoagulants: Secondary | ICD-10-CM | POA: Diagnosis not present

## 2021-10-22 DIAGNOSIS — I700 Atherosclerosis of aorta: Secondary | ICD-10-CM | POA: Diagnosis not present

## 2021-10-22 DIAGNOSIS — I480 Paroxysmal atrial fibrillation: Secondary | ICD-10-CM | POA: Diagnosis not present

## 2021-12-10 ENCOUNTER — Ambulatory Visit: Payer: Medicare HMO | Admitting: Cardiology

## 2021-12-21 ENCOUNTER — Other Ambulatory Visit: Payer: Self-pay | Admitting: Internal Medicine

## 2022-01-01 ENCOUNTER — Other Ambulatory Visit: Payer: Self-pay | Admitting: Cardiology

## 2022-01-01 DIAGNOSIS — I4811 Longstanding persistent atrial fibrillation: Secondary | ICD-10-CM

## 2022-01-29 ENCOUNTER — Other Ambulatory Visit: Payer: Self-pay | Admitting: Cardiology

## 2022-02-14 IMAGING — CR DG CHEST 2V
2 series · 2 of 2 positions shown · non-contrast
Comparison: 04/16/2017

CLINICAL DATA: Shortness of breath

EXAM:
CHEST - 2 VIEW

[chest lat]
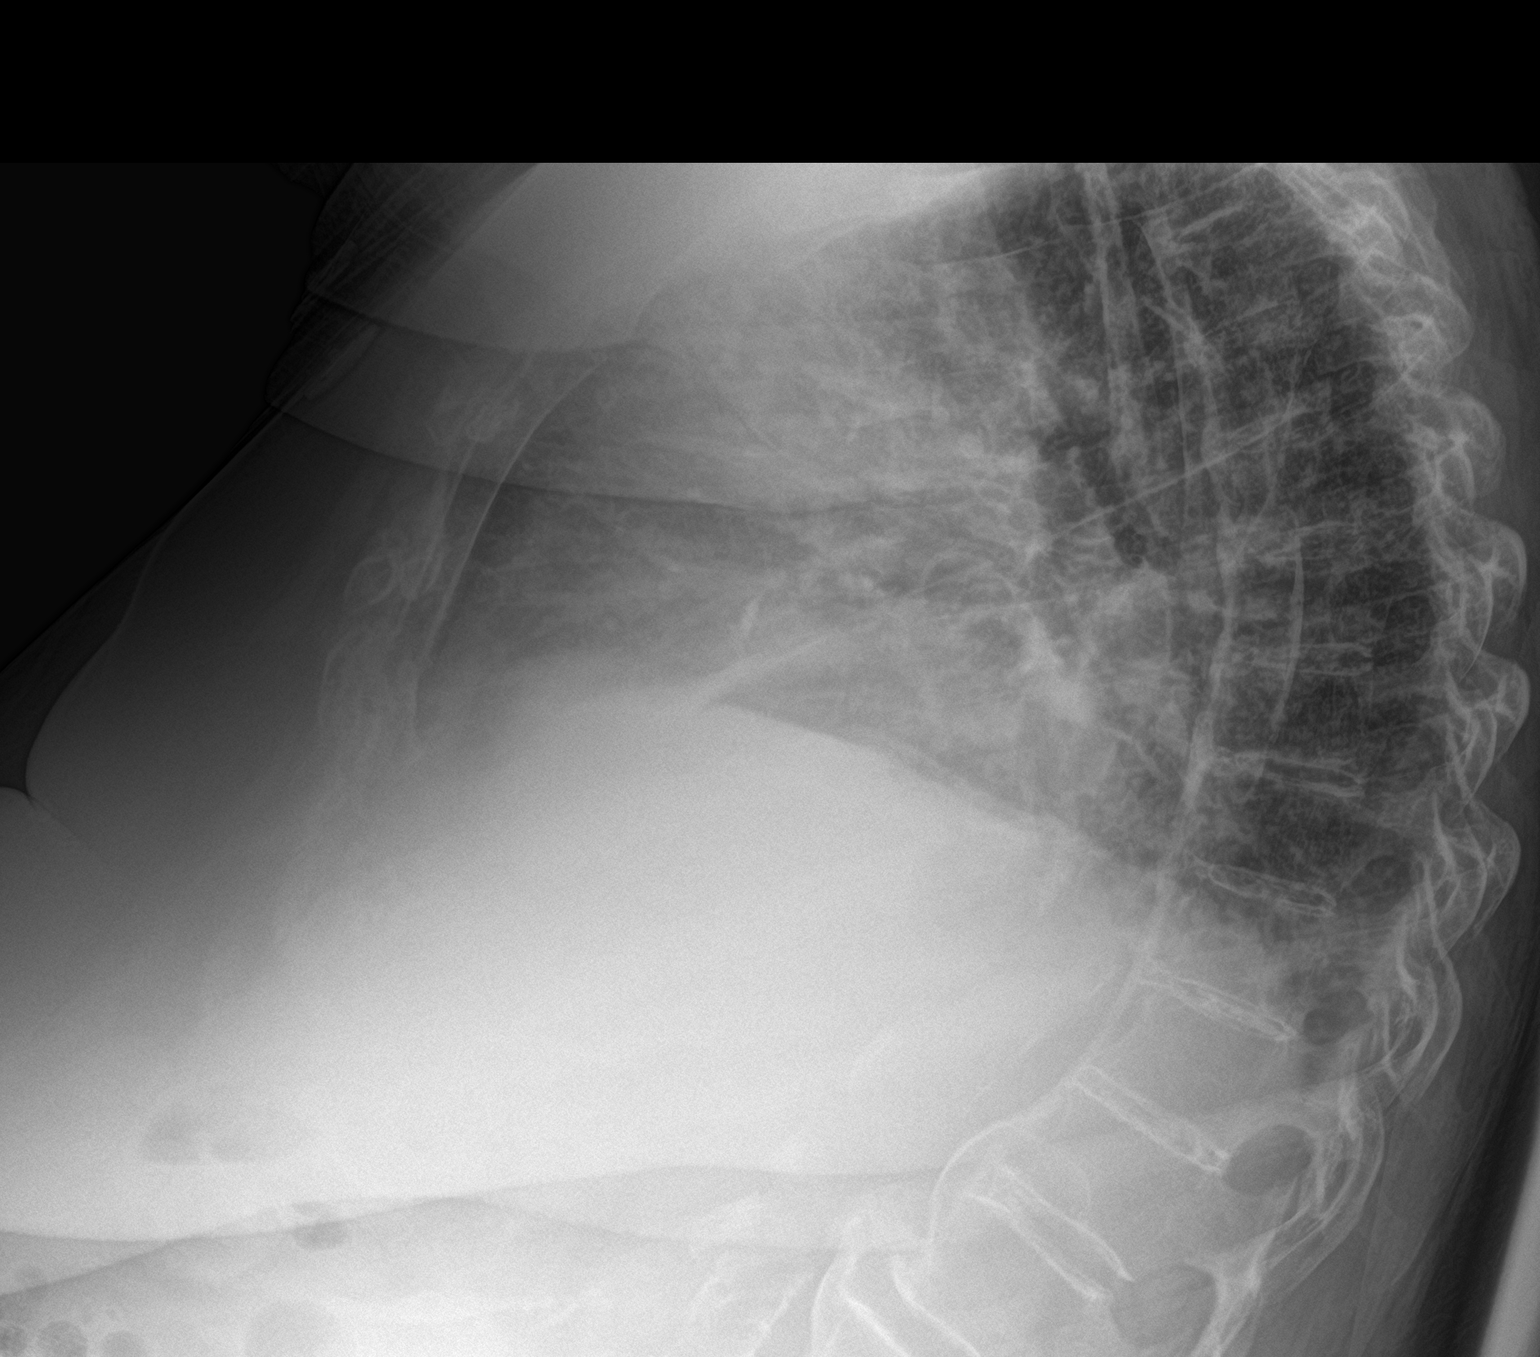

[chest ap]
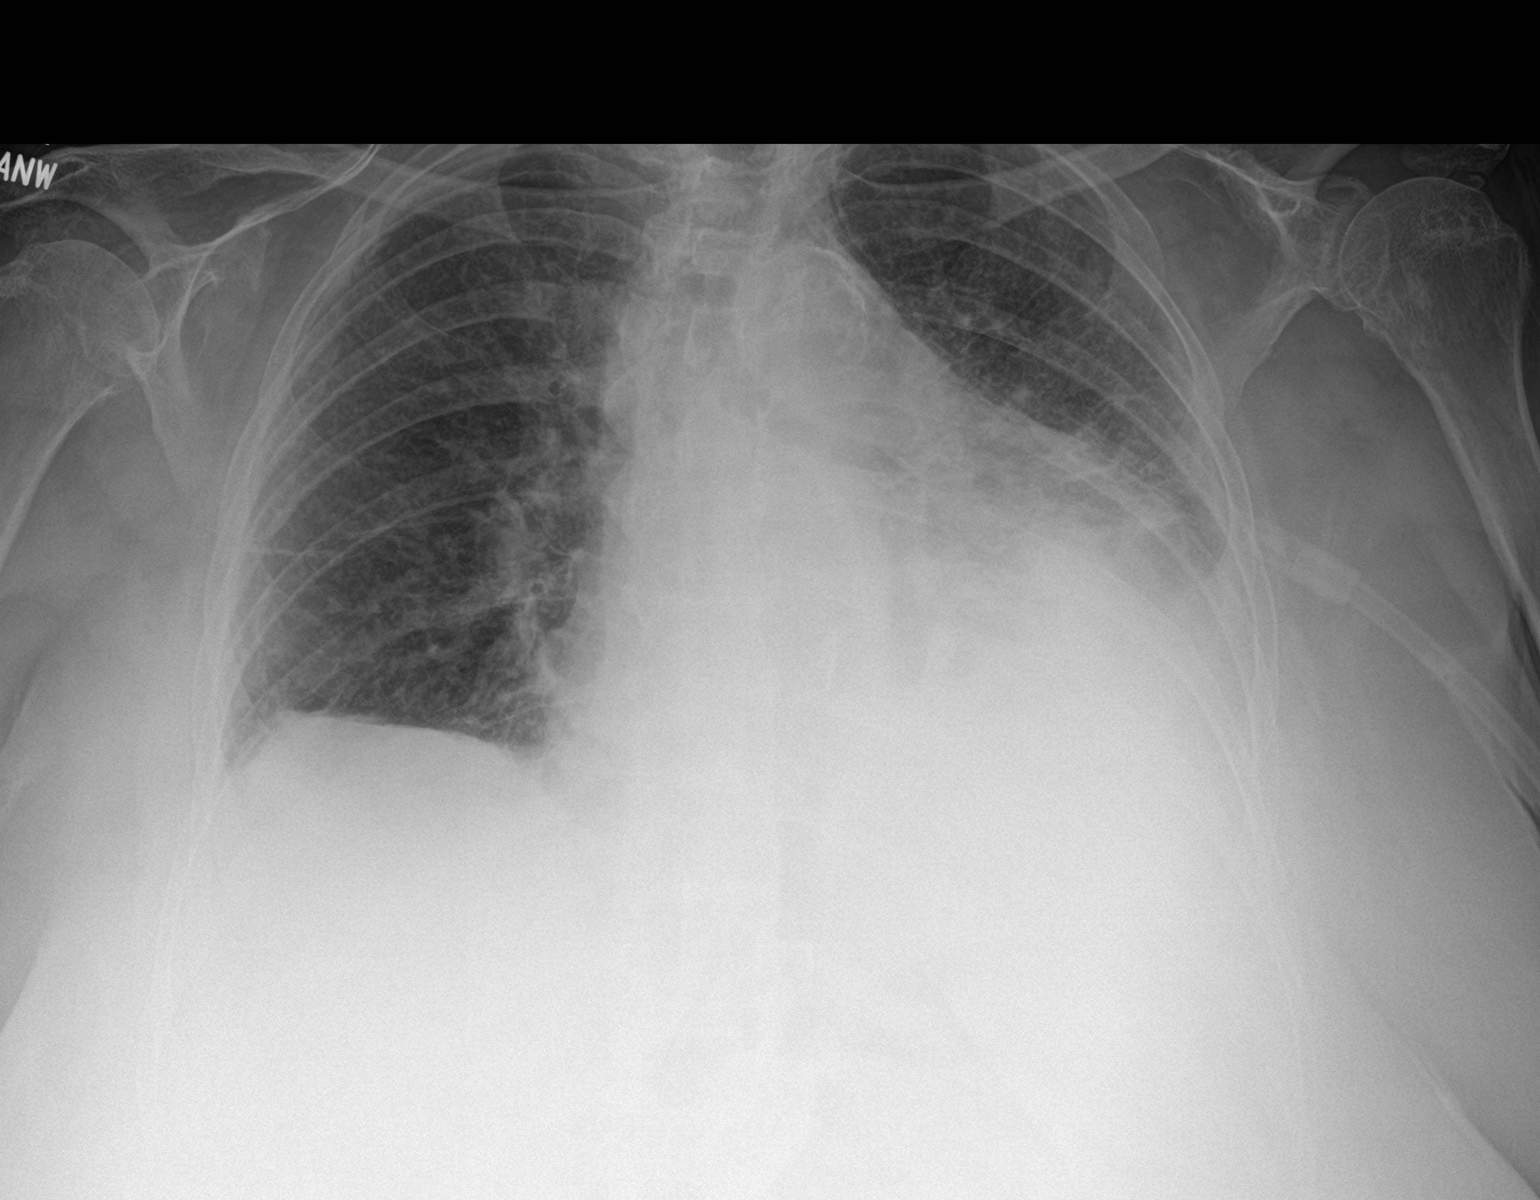

[2 of 2 positions shown; findings below may reference images not displayed]

FINDINGS: Low lung volumes. Suspected trace right pleural effusion. Airspace
disease at the left lung base with probable small effusion. Enlarged
cardiomediastinal silhouette. No pneumothorax.
IMPRESSION: Cardiomegaly with low lung volumes, suspected small right and small
moderate left pleural effusions, and left basilar consolidation
which may reflect atelectasis or pneumonia.

## 2022-02-18 IMAGING — US IR THORACENTESIS ASP PLEURAL SPACE W/IMG GUIDE
1 series · 2 of 2 positions shown · non-contrast
Comparison: none

INDICATION: Patient with acute on chronic congestive heart failure, left pleural
effusion. Request is made for diagnostic and therapeutic
thoracentesis.

[Series 1: ir (id) (id)/(id)/(id) ir · 2 of 2 slices shown]
[im 1/2]
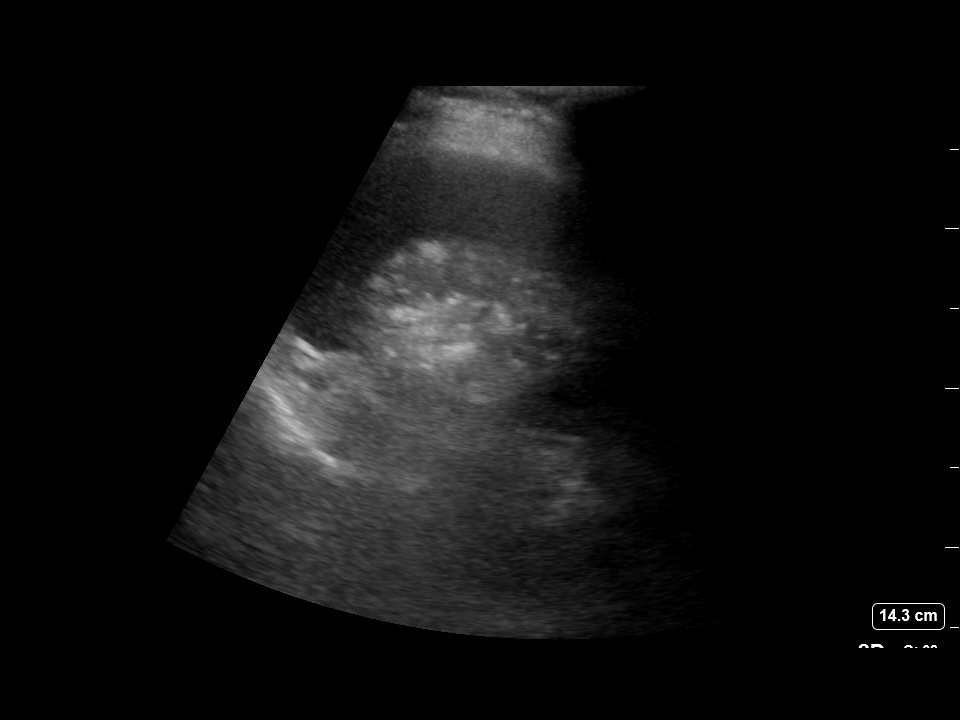
[im 2/2]
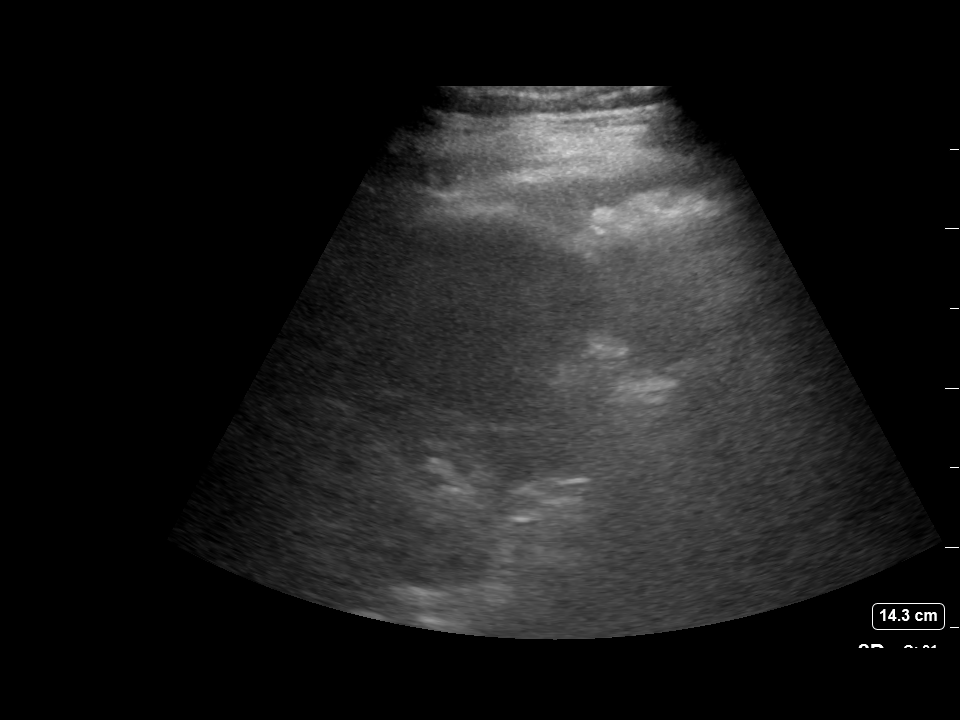

[2 of 2 positions shown; findings below may reference images not displayed]

EXAM:
ULTRASOUND GUIDED DIAGNOSTIC AND THERAPEUTIC LEFT THORACENTESIS

MEDICATIONS:
10 mL 1% lidocaine

COMPLICATIONS:
None immediate.

PROCEDURE:
An ultrasound guided thoracentesis was thoroughly discussed with the
patient and questions answered. The benefits, risks, alternatives
and complications were also discussed. The patient understands and
wishes to proceed with the procedure. Written consent was obtained.

Ultrasound was performed to localize and mark an adequate pocket of
fluid in the left chest. The area was then prepped and draped in the
normal sterile fashion. 1% Lidocaine was used for local anesthesia.
Under ultrasound guidance a 6 Fr Safe-T-Centesis catheter was
introduced. Thoracentesis was performed. The catheter was removed
and a dressing applied.
FINDINGS: A total of approximately 850 mL of dark yellow fluid was removed.
Samples were sent to the laboratory as requested by the clinical
team.
IMPRESSION: Successful ultrasound guided diagnostic and therapeutic left
thoracentesis yielding 850 mL of pleural fluid.

## 2022-03-25 DIAGNOSIS — R739 Hyperglycemia, unspecified: Secondary | ICD-10-CM | POA: Diagnosis not present

## 2022-03-25 DIAGNOSIS — R809 Proteinuria, unspecified: Secondary | ICD-10-CM | POA: Diagnosis not present

## 2022-03-25 DIAGNOSIS — D6869 Other thrombophilia: Secondary | ICD-10-CM | POA: Diagnosis not present

## 2022-03-25 DIAGNOSIS — I130 Hypertensive heart and chronic kidney disease with heart failure and stage 1 through stage 4 chronic kidney disease, or unspecified chronic kidney disease: Secondary | ICD-10-CM | POA: Diagnosis not present

## 2022-03-25 DIAGNOSIS — E785 Hyperlipidemia, unspecified: Secondary | ICD-10-CM | POA: Diagnosis not present

## 2022-03-25 DIAGNOSIS — I480 Paroxysmal atrial fibrillation: Secondary | ICD-10-CM | POA: Diagnosis not present

## 2022-03-25 DIAGNOSIS — I700 Atherosclerosis of aorta: Secondary | ICD-10-CM | POA: Diagnosis not present

## 2022-03-25 DIAGNOSIS — N1831 Chronic kidney disease, stage 3a: Secondary | ICD-10-CM | POA: Diagnosis not present

## 2022-03-25 DIAGNOSIS — E039 Hypothyroidism, unspecified: Secondary | ICD-10-CM | POA: Diagnosis not present

## 2022-03-25 DIAGNOSIS — I5022 Chronic systolic (congestive) heart failure: Secondary | ICD-10-CM | POA: Diagnosis not present

## 2022-03-25 DIAGNOSIS — Z7901 Long term (current) use of anticoagulants: Secondary | ICD-10-CM | POA: Diagnosis not present

## 2022-03-25 DIAGNOSIS — I2721 Secondary pulmonary arterial hypertension: Secondary | ICD-10-CM | POA: Diagnosis not present

## 2022-03-31 DIAGNOSIS — I480 Paroxysmal atrial fibrillation: Secondary | ICD-10-CM | POA: Diagnosis not present

## 2022-03-31 DIAGNOSIS — E785 Hyperlipidemia, unspecified: Secondary | ICD-10-CM | POA: Diagnosis not present

## 2022-03-31 DIAGNOSIS — E039 Hypothyroidism, unspecified: Secondary | ICD-10-CM | POA: Diagnosis not present

## 2022-03-31 DIAGNOSIS — I5022 Chronic systolic (congestive) heart failure: Secondary | ICD-10-CM | POA: Diagnosis not present

## 2022-04-07 NOTE — Progress Notes (Signed)
? ? ?Subjective:  ? ?[name], female    DOB: 02/23/1946, 76 y.o.   MRN: [id number] ? ? ?Chief Complaint  ?Patient presents with  ? Follow-up  ?  6 month  ? HFpEF  ? Hypertension  ? ? ?76 y.o. Caucasian female with hypertension, persistent atrial fibrillation, moderate obesity, HFpEF, pericardial effusion s/p pericardiocentesis (01/2021).  ? ?Patient presents for 6 month follow up. At last office visit spironolactone had been stopped due to hyperkalemia, otherwise patient was stable form a cardiac standpoint and no changes were made.  Patient is feeling well overall.  Denies chest pain, dyspnea, leg edema.  She remains active.  Monitors blood pressure at home, although she cannot provide specific readings she states blood pressure is well controlled.  She was seen by PCP 2 weeks ago at which time her blood pressure was well controlled.  She is scheduled to see PCP for regular follow-up in July. ? ?Current Outpatient Medications on File Prior to Visit  ?Medication Sig Dispense Refill  ? acetaminophen (TYLENOL) 325 MG tablet Take 325-650 mg by mouth at bedtime as needed for mild pain.    ? Calcium Carb-Cholecalciferol (CALTRATE 600+D3 PO) Take 1 tablet by mouth every evening.    ? CVS IRON 325 (65 Fe) MG tablet Take 325 mg by mouth daily at 6 PM.    ? diltiazem (CARDIZEM CD) 120 MG 24 hr capsule TAKE 1 CAPSULE BY MOUTH EVERY DAY 90 capsule 1  ? ELIQUIS 5 MG TABS tablet TAKE 1 TABLET BY MOUTH TWICE A DAY 60 tablet 5  ? levothyroxine (SYNTHROID, LEVOTHROID) 125 MCG tablet Take 125 mcg by mouth daily before breakfast.    ? metoprolol tartrate (LOPRESSOR) 50 MG tablet Take 1 tablet (50 mg total) by mouth 2 (two) times daily. 180 tablet 2  ? omeprazole (PRILOSEC) 20 MG capsule TAKE 1 CAPSULE BY MOUTH EVERY DAY 90 capsule 1  ? pravastatin (PRAVACHOL) 80 MG tablet Take 80 mg by mouth in the morning.    ? vitamin B-12 (CYANOCOBALAMIN) 1000 MCG tablet Take 1,000 mcg by mouth daily.    ? ?No current facility-administered  medications on file prior to visit.  ? ? ?Cardiovascular and other pertinent studies: ?EKG 04/08/2022:  ?Atrial fibrillation with controlled ventricular response at a rate of 69 bpm.  Normal axis.  Nonspecific ST-T wave changes.  Compared to EKG 02/01/2021, no significant change. ? ?Echocardiogram 02/07/2021:  ?Left ventricle cavity is normal in size and wall thickness. Normal LV  ?systolic function with visual EF 50-55%. Normal global wall motion. Unable  ?to evaluate diastolic function due to atrial fibrillation.  ?Left atrial cavity is moderately dilated.  ?Structurally normal trileaflet aortic valve. Mild aortic valve stenosis.  ?Vmax 1.8 m/sec, mean PG 9 mmHg, AVA 1.1 cm2 by continuity equation  ?Mild tricuspid regurgitation.  ?No evidence of pulmonary hypertension.  ?No significant change compared to previous study on 01/25/2021.  ? ?EKG 02/01/2021: ?Atrial fibrillation 79 bpm ?Low voltage in precordial leads ?Anterior infarct -age undetermined ?Nonspecific ST-T changes. Low suspicion for dig toxicity ? ?CXT 01/12/2019: ?Slowly resolving congestive failure pattern. ?Possible superimposed pneumonia in LLL ? ?EKG 12/25/2020: ?Atrial fibrillation 77 bpm ? ?Recent labs: ?07/04/2021: ?Glucose 99, BUN/Cr 30/?. EGFR 29 ? ?06/22/2021: ?Glucose 97, BUN/Cr 36/1.79. EGFR 29. Na/K 134/4.8.  ?H/H 14/45. MCV 84. Platelets 325 ? ?02/01/2021: ?Glucose 83, BUN/Cr 12/1.14. EGFR 50. Na/K 137/3.7. Albumin 3.3. Rest of the CMP normal ?H/H 10.8/35.9. MCV 71.7. Platelets 356 ?HbA1C N/A ?Lipid panel N/A ?  TSH N/A ? ?01/30/2021: ?BNP 429 ? ?01/12/2021: ?Glucose 67, BUN/Cr 26/1.2. EGFR 43. Na/K 134/4.4. Rest of the CMP normal ? ?12/25/2020: ?BNP 265 ? ?09/14/2019: ?Glucose 108, BUN/Cr 16/1.4. EGFR ?. Na/K ?/?.  Rest of the CMP normal.  ?Chol 183, TG 123, HDL 47, LDL 111 ? ? ? ?Review of Systems  ?Cardiovascular:  Negative for chest pain, dyspnea on exertion, leg swelling, palpitations and syncope.  ?Respiratory:  Negative for shortness of breath.   ? ?    ? ?Vitals:  ? 04/08/22 0934 04/08/22 0953  ?BP: (!) 160/80 (!) 157/75  ?Pulse: 89 63  ?Resp: 17   ?Temp: 98.4 ?F (36.9 ?C)   ?SpO2: 98%   ? ? ?Body mass index is 32.12 kg/m?. ? ? ?Objective:  ? Physical Exam ?Vitals reviewed.  ?Constitutional:   ?   Appearance: She is well-developed.  ?Neck:  ?   Vascular: No JVD.  ?Cardiovascular:  ?   Rate and Rhythm: Normal rate. Rhythm irregular.  ?   Pulses: Intact distal pulses.  ?   Heart sounds: Normal heart sounds. No murmur heard. ?Pulmonary:  ?   Effort: No respiratory distress.  ?   Breath sounds: No wheezing or rales.  ?Musculoskeletal:  ?   Right lower leg: No edema.  ?   Left lower leg: No edema.  ?Physical exam unchanged from previous office visit. ? ?   ? ?Assessment & Recommendations:  ? ?76 y.o. Caucasian female with hypertension, persistent atrial fibrillation, moderate obesity, HFpEF ?  ?Chronic HFpEF: ?Euvolemic on exam, no evidence of acute decompensated heart failure. ?Continue metoprolol ?Previously unable to tolerate spironolactone due to hyperkalemia and AKI ? ?Persistent Afib: ?She remains well rate controlled with metoprolol and diltiazem, will continue this. ?She continues to tolerate anticoagulation without bleeding diathesis. ?Continue Eliquis ?CHA2DS2VASc score 3: Annual stroke risk 4% ? ?Pericardial effusion: ?S/p pericardiocentesis 01/2021 ?No recurrence ? ?Hypertension: ?Blood pressure is again elevated in the office today.  However she had visit with PCP 2 weeks ago which time it was well controlled and patient reports monitoring is well controlled.  Discussed addition of amlodipine, however will hold off as suspect component of whitecoat hypertension.  Patient will monitor blood pressure daily at home and keep a written log which she will take to her upcoming PCP, appointment.  Patient to notify our office if systolic blood pressures on average >140 mmHg on home monitoring. ? ?Follow-up in 6 months, sooner if needed. ? ? ?Alethia Berthold,  PA-C ?04/08/2022, 9:56 AM ?Office: [phone] ?

## 2022-04-08 ENCOUNTER — Ambulatory Visit: Payer: Medicare HMO | Admitting: Student

## 2022-04-08 ENCOUNTER — Encounter: Payer: Self-pay | Admitting: Student

## 2022-04-08 ENCOUNTER — Ambulatory Visit: Payer: Medicare HMO | Admitting: Cardiology

## 2022-04-08 VITALS — BP 157/75 | HR 63 | Temp 98.4°F | Resp 17 | Ht 66.0 in | Wt 199.0 lb

## 2022-04-08 DIAGNOSIS — I10 Essential (primary) hypertension: Secondary | ICD-10-CM

## 2022-04-08 DIAGNOSIS — I5032 Chronic diastolic (congestive) heart failure: Secondary | ICD-10-CM | POA: Diagnosis not present

## 2022-04-08 DIAGNOSIS — I4811 Longstanding persistent atrial fibrillation: Secondary | ICD-10-CM

## 2022-04-11 ENCOUNTER — Encounter: Payer: Self-pay | Admitting: Cardiology

## 2022-04-11 ENCOUNTER — Other Ambulatory Visit: Payer: Self-pay | Admitting: Student

## 2022-04-11 MED ORDER — AMLODIPINE BESYLATE 5 MG PO TABS: 5.0000 mg | ORAL_TABLET | Freq: Every day | ORAL | 3 refills | Status: DC

## 2022-04-11 NOTE — Progress Notes (Signed)
Start amlodipine, stop diltiazem ? ?

## 2022-04-11 NOTE — Telephone Encounter (Signed)
From patient.

## 2022-06-25 DIAGNOSIS — R739 Hyperglycemia, unspecified: Secondary | ICD-10-CM | POA: Diagnosis not present

## 2022-06-25 DIAGNOSIS — R7989 Other specified abnormal findings of blood chemistry: Secondary | ICD-10-CM | POA: Diagnosis not present

## 2022-06-25 DIAGNOSIS — I700 Atherosclerosis of aorta: Secondary | ICD-10-CM | POA: Diagnosis not present

## 2022-06-25 DIAGNOSIS — E559 Vitamin D deficiency, unspecified: Secondary | ICD-10-CM | POA: Diagnosis not present

## 2022-06-25 DIAGNOSIS — E039 Hypothyroidism, unspecified: Secondary | ICD-10-CM | POA: Diagnosis not present

## 2022-06-25 DIAGNOSIS — E785 Hyperlipidemia, unspecified: Secondary | ICD-10-CM | POA: Diagnosis not present

## 2022-06-25 DIAGNOSIS — M8589 Other specified disorders of bone density and structure, multiple sites: Secondary | ICD-10-CM | POA: Diagnosis not present

## 2022-06-27 ENCOUNTER — Encounter: Payer: Self-pay | Admitting: Cardiology

## 2022-06-27 ENCOUNTER — Other Ambulatory Visit: Payer: Self-pay | Admitting: Student

## 2022-06-27 ENCOUNTER — Other Ambulatory Visit: Payer: Self-pay | Admitting: Cardiology

## 2022-06-27 ENCOUNTER — Other Ambulatory Visit: Payer: Self-pay

## 2022-06-27 DIAGNOSIS — I4811 Longstanding persistent atrial fibrillation: Secondary | ICD-10-CM

## 2022-06-27 MED ORDER — AMLODIPINE BESYLATE 5 MG PO TABS: 5.0000 mg | ORAL_TABLET | Freq: Every day | ORAL | 3 refills | Status: DC

## 2022-07-02 DIAGNOSIS — Z0000 Encounter for general adult medical examination without abnormal findings: Secondary | ICD-10-CM | POA: Diagnosis not present

## 2022-07-02 DIAGNOSIS — R82998 Other abnormal findings in urine: Secondary | ICD-10-CM | POA: Diagnosis not present

## 2022-07-02 DIAGNOSIS — E039 Hypothyroidism, unspecified: Secondary | ICD-10-CM | POA: Diagnosis not present

## 2022-07-02 DIAGNOSIS — I2721 Secondary pulmonary arterial hypertension: Secondary | ICD-10-CM | POA: Diagnosis not present

## 2022-07-02 DIAGNOSIS — I480 Paroxysmal atrial fibrillation: Secondary | ICD-10-CM | POA: Diagnosis not present

## 2022-07-02 DIAGNOSIS — E785 Hyperlipidemia, unspecified: Secondary | ICD-10-CM | POA: Diagnosis not present

## 2022-07-02 DIAGNOSIS — Z23 Encounter for immunization: Secondary | ICD-10-CM | POA: Diagnosis not present

## 2022-07-02 DIAGNOSIS — I700 Atherosclerosis of aorta: Secondary | ICD-10-CM | POA: Diagnosis not present

## 2022-07-02 DIAGNOSIS — I130 Hypertensive heart and chronic kidney disease with heart failure and stage 1 through stage 4 chronic kidney disease, or unspecified chronic kidney disease: Secondary | ICD-10-CM | POA: Diagnosis not present

## 2022-07-02 DIAGNOSIS — Z7901 Long term (current) use of anticoagulants: Secondary | ICD-10-CM | POA: Diagnosis not present

## 2022-07-02 DIAGNOSIS — M8580 Other specified disorders of bone density and structure, unspecified site: Secondary | ICD-10-CM | POA: Diagnosis not present

## 2022-07-02 DIAGNOSIS — N1831 Chronic kidney disease, stage 3a: Secondary | ICD-10-CM | POA: Diagnosis not present

## 2022-07-02 DIAGNOSIS — D6869 Other thrombophilia: Secondary | ICD-10-CM | POA: Diagnosis not present

## 2022-07-29 ENCOUNTER — Other Ambulatory Visit: Payer: Self-pay | Admitting: Cardiology

## 2022-07-29 DIAGNOSIS — I4811 Longstanding persistent atrial fibrillation: Secondary | ICD-10-CM

## 2022-07-29 DIAGNOSIS — I10 Essential (primary) hypertension: Secondary | ICD-10-CM

## 2022-10-09 ENCOUNTER — Ambulatory Visit: Payer: Medicare HMO | Admitting: Cardiology

## 2022-10-09 ENCOUNTER — Ambulatory Visit: Payer: Medicare HMO | Admitting: Student

## 2022-10-09 ENCOUNTER — Encounter: Payer: Self-pay | Admitting: Cardiology

## 2022-10-09 VITALS — BP 146/75 | HR 89 | Ht 66.0 in | Wt 199.0 lb

## 2022-10-09 DIAGNOSIS — I10 Essential (primary) hypertension: Secondary | ICD-10-CM | POA: Diagnosis not present

## 2022-10-09 DIAGNOSIS — I5032 Chronic diastolic (congestive) heart failure: Secondary | ICD-10-CM | POA: Diagnosis not present

## 2022-10-09 DIAGNOSIS — I3139 Other pericardial effusion (noninflammatory): Secondary | ICD-10-CM

## 2022-10-09 DIAGNOSIS — I4811 Longstanding persistent atrial fibrillation: Secondary | ICD-10-CM

## 2022-10-09 NOTE — Progress Notes (Signed)
Subjective:   [name], female    DOB: 29-Sep-1946, 76 y.o.   MRN: [id number]   Chief Complaint  Patient presents with   Atrial Fibrillation   Follow-up    76 year old Caucasian female with hypertension, persistent atrial fibrillation, moderate obesity, HFpEF, pericardial effusion s/p pericardiocentesis (01/2021), here for hospital f/u visit  Patient presents for 6 month follow up. At last office visit she was stable form a cardiac standpoint and no changes were made.  Patient is feeling well overall.  Denies chest pain, dyspnea, leg edema.  She remains active.  Monitors blood pressure at home and readings are well controlled. Labs were checked by Dr. Ke[facility] office. Cr was stable at 1.79, K results not available to me, TSH was low at 0.09. Overall, she is stable from cardiac standpoint without any complaints today.   Current Outpatient Medications:    acetaminophen (TYLENOL) 325 MG tablet, Take 325-650 mg by mouth at bedtime as needed for mild pain., Disp: , Rfl:    amLODipine (NORVASC) 5 MG tablet, TAKE 1 TABLET (5 MG TOTAL) BY MOUTH DAILY., Disp: 90 tablet, Rfl: 1   Calcium Carb-Cholecalciferol (CALTRATE 600+D3 PO), Take 1 tablet by mouth every evening., Disp: , Rfl:    CVS IRON 325 (65 Fe) MG tablet, Take 325 mg by mouth daily at 6 PM., Disp: , Rfl:    ELIQUIS 5 MG TABS tablet, TAKE 1 TABLET BY MOUTH TWICE A DAY, Disp: 60 tablet, Rfl: 5   levothyroxine (SYNTHROID, LEVOTHROID) 125 MCG tablet, Take 125 mcg by mouth daily before breakfast., Disp: , Rfl:    metoprolol tartrate (LOPRESSOR) 50 MG tablet, TAKE 1 TABLET BY MOUTH TWICE A DAY, Disp: 180 tablet, Rfl: 2   omeprazole (PRILOSEC) 20 MG capsule, TAKE 1 CAPSULE BY MOUTH EVERY DAY, Disp: 90 capsule, Rfl: 1   pravastatin (PRAVACHOL) 80 MG tablet, Take 80 mg by mouth in the morning., Disp: , Rfl:    vitamin B-12 (CYANOCOBALAMIN) 1000 MCG tablet, Take 1,000 mcg by mouth daily., Disp: , Rfl:   Cardiovascular and other pertinent  studies:  Echocardiogram 02/07/2021:  Left ventricle cavity is normal in size and wall thickness. Normal LV  systolic function with visual EF 50-55%. Normal global wall motion. Unable  to evaluate diastolic function due to atrial fibrillation.  Left atrial cavity is moderately dilated.  Structurally normal trileaflet aortic valve. Mild aortic valve stenosis.  Vmax 1.8 m/sec, mean PG 9 mmHg, AVA 1.1 cm2 by continuity equation  Mild tricuspid regurgitation.  No evidence of pulmonary hypertension.  No significant change compared to previous study on 01/25/2021.   EKG 02/01/2021: Atrial fibrillation 79 bpm Low voltage in precordial leads Anterior infarct -age undetermined Nonspecific ST-T changes. Low suspicion for dig toxicity  CXT 01/12/2019: Slowly resolving congestive failure pattern. Possible superimposed pneumonia in LLL  EKG 12/25/2020: Atrial fibrillation 77 bpm  Recent labs: 06/25/2022: Glucose NA, BUN/Cr 25/1.79. EGFR 36. K 4.8.  HbA1C 5.5% Chol 162, TG 107, HDL 60, LDL 81 TSH 0.09 low   Review of Systems  Cardiovascular:  Negative for chest pain, dyspnea on exertion, leg swelling, palpitations and syncope.  Respiratory:  Negative for shortness of breath.   Gastrointestinal:  Positive for nausea. Negative for abdominal pain, diarrhea and vomiting.    Vitals:   10/09/22 1014  BP: (!) 146/75  Pulse: 89  SpO2: 98%    Body mass index is 32.12 kg/m.   Objective:   Physical Exam Vitals and nursing note reviewed.  Constitutional:      Appearance: She is well-developed.  Neck:     Vascular: No JVD.  Cardiovascular:     Rate and Rhythm: Normal rate. Rhythm irregular.     Pulses: Intact distal pulses.     Heart sounds: Normal heart sounds. No murmur heard. Pulmonary:     Effort: No respiratory distress.     Breath sounds: No wheezing or rales.  Musculoskeletal:     Right lower leg: No edema.     Left lower leg: No edema.     Assessment & Recommendations:    76 year old Caucasian female with hypertension, persistent atrial fibrillation, moderate obesity, HFpEF  Chronic HFpEF: Euvolumic. Not on diuretics, no evidence of clinical heart failure. Spironolactone previously stopped due to hyperkalemia and AKI.  Persistent Afib: Rate controlled on metoprolol tartrate 50 mg bid, diltiazem 120 mg daily. CHA2DS2VASc score 3: Annual stroke risk 4% Remains on Eliquis 5 mg DAILY without bleeding diathesis.  Hypertension: Blood pressure slightly elevated at today's visit.  She does monitor her blood pressure at home and brings in a log of readings that are well controlled. Continue current medications without changes.  Pericardial effusion: S/p pericardiocentesis 01/2021 No recurrence  Reviewed external labs, TSH is low.  I have advised her to follow-up with PCP as she may be over treated with current dose of Levothyroxine.   Follow-up in 1 sooner if needed.   Ernst Spell, AGNP-C [facility] Pager: [phone] Office: [phone]

## 2022-11-04 DIAGNOSIS — H2513 Age-related nuclear cataract, bilateral: Secondary | ICD-10-CM | POA: Diagnosis not present

## 2023-01-06 DIAGNOSIS — Z7901 Long term (current) use of anticoagulants: Secondary | ICD-10-CM | POA: Diagnosis not present

## 2023-01-06 DIAGNOSIS — N1831 Chronic kidney disease, stage 3a: Secondary | ICD-10-CM | POA: Diagnosis not present

## 2023-01-06 DIAGNOSIS — I130 Hypertensive heart and chronic kidney disease with heart failure and stage 1 through stage 4 chronic kidney disease, or unspecified chronic kidney disease: Secondary | ICD-10-CM | POA: Diagnosis not present

## 2023-01-06 DIAGNOSIS — R739 Hyperglycemia, unspecified: Secondary | ICD-10-CM | POA: Diagnosis not present

## 2023-01-06 DIAGNOSIS — I700 Atherosclerosis of aorta: Secondary | ICD-10-CM | POA: Diagnosis not present

## 2023-01-06 DIAGNOSIS — E039 Hypothyroidism, unspecified: Secondary | ICD-10-CM | POA: Diagnosis not present

## 2023-01-06 DIAGNOSIS — D6869 Other thrombophilia: Secondary | ICD-10-CM | POA: Diagnosis not present

## 2023-01-06 DIAGNOSIS — E669 Obesity, unspecified: Secondary | ICD-10-CM | POA: Diagnosis not present

## 2023-01-06 DIAGNOSIS — I2721 Secondary pulmonary arterial hypertension: Secondary | ICD-10-CM | POA: Diagnosis not present

## 2023-01-06 DIAGNOSIS — I5022 Chronic systolic (congestive) heart failure: Secondary | ICD-10-CM | POA: Diagnosis not present

## 2023-01-15 ENCOUNTER — Other Ambulatory Visit: Payer: Self-pay | Admitting: Cardiology

## 2023-03-11 ENCOUNTER — Other Ambulatory Visit: Payer: Self-pay | Admitting: Cardiology

## 2023-04-02 ENCOUNTER — Encounter: Payer: Self-pay | Admitting: Internal Medicine

## 2023-06-08 ENCOUNTER — Other Ambulatory Visit: Payer: Self-pay | Admitting: Cardiology

## 2023-06-08 DIAGNOSIS — I10 Essential (primary) hypertension: Secondary | ICD-10-CM

## 2023-07-05 ENCOUNTER — Other Ambulatory Visit: Payer: Self-pay | Admitting: Cardiology

## 2023-07-07 DIAGNOSIS — H2513 Age-related nuclear cataract, bilateral: Secondary | ICD-10-CM | POA: Diagnosis not present

## 2023-07-10 DIAGNOSIS — R739 Hyperglycemia, unspecified: Secondary | ICD-10-CM | POA: Diagnosis not present

## 2023-07-10 DIAGNOSIS — E785 Hyperlipidemia, unspecified: Secondary | ICD-10-CM | POA: Diagnosis not present

## 2023-07-10 DIAGNOSIS — I130 Hypertensive heart and chronic kidney disease with heart failure and stage 1 through stage 4 chronic kidney disease, or unspecified chronic kidney disease: Secondary | ICD-10-CM | POA: Diagnosis not present

## 2023-07-10 DIAGNOSIS — I5022 Chronic systolic (congestive) heart failure: Secondary | ICD-10-CM | POA: Diagnosis not present

## 2023-07-10 DIAGNOSIS — N1831 Chronic kidney disease, stage 3a: Secondary | ICD-10-CM | POA: Diagnosis not present

## 2023-07-10 DIAGNOSIS — Z1212 Encounter for screening for malignant neoplasm of rectum: Secondary | ICD-10-CM | POA: Diagnosis not present

## 2023-07-10 DIAGNOSIS — E039 Hypothyroidism, unspecified: Secondary | ICD-10-CM | POA: Diagnosis not present

## 2023-07-10 DIAGNOSIS — E559 Vitamin D deficiency, unspecified: Secondary | ICD-10-CM | POA: Diagnosis not present

## 2023-07-17 DIAGNOSIS — I130 Hypertensive heart and chronic kidney disease with heart failure and stage 1 through stage 4 chronic kidney disease, or unspecified chronic kidney disease: Secondary | ICD-10-CM | POA: Diagnosis not present

## 2023-07-17 DIAGNOSIS — N1831 Chronic kidney disease, stage 3a: Secondary | ICD-10-CM | POA: Diagnosis not present

## 2023-07-17 DIAGNOSIS — Z1212 Encounter for screening for malignant neoplasm of rectum: Secondary | ICD-10-CM | POA: Diagnosis not present

## 2023-07-17 DIAGNOSIS — Z7901 Long term (current) use of anticoagulants: Secondary | ICD-10-CM | POA: Diagnosis not present

## 2023-07-17 DIAGNOSIS — R82998 Other abnormal findings in urine: Secondary | ICD-10-CM | POA: Diagnosis not present

## 2023-07-17 DIAGNOSIS — D6869 Other thrombophilia: Secondary | ICD-10-CM | POA: Diagnosis not present

## 2023-07-17 DIAGNOSIS — I480 Paroxysmal atrial fibrillation: Secondary | ICD-10-CM | POA: Diagnosis not present

## 2023-07-17 DIAGNOSIS — I5022 Chronic systolic (congestive) heart failure: Secondary | ICD-10-CM | POA: Diagnosis not present

## 2023-07-17 DIAGNOSIS — I700 Atherosclerosis of aorta: Secondary | ICD-10-CM | POA: Diagnosis not present

## 2023-07-17 DIAGNOSIS — E785 Hyperlipidemia, unspecified: Secondary | ICD-10-CM | POA: Diagnosis not present

## 2023-07-17 DIAGNOSIS — Z1331 Encounter for screening for depression: Secondary | ICD-10-CM | POA: Diagnosis not present

## 2023-07-17 DIAGNOSIS — Z0000 Encounter for general adult medical examination without abnormal findings: Secondary | ICD-10-CM | POA: Diagnosis not present

## 2023-07-17 DIAGNOSIS — I272 Pulmonary hypertension, unspecified: Secondary | ICD-10-CM | POA: Diagnosis not present

## 2023-07-17 DIAGNOSIS — E039 Hypothyroidism, unspecified: Secondary | ICD-10-CM | POA: Diagnosis not present

## 2023-07-17 DIAGNOSIS — R809 Proteinuria, unspecified: Secondary | ICD-10-CM | POA: Diagnosis not present

## 2023-08-01 ENCOUNTER — Encounter (HOSPITAL_COMMUNITY): Payer: Self-pay

## 2023-08-01 ENCOUNTER — Other Ambulatory Visit: Payer: Self-pay

## 2023-08-01 ENCOUNTER — Inpatient Hospital Stay (HOSPITAL_COMMUNITY)
Admission: EM | Admit: 2023-08-01 | Discharge: 2023-08-06 | DRG: 640 | Disposition: A | Discharge status: Home / Self Care | Payer: Medicare HMO | Attending: Internal Medicine | Admitting: Internal Medicine

## 2023-08-01 ENCOUNTER — Encounter: Payer: Self-pay | Admitting: Cardiology

## 2023-08-01 DIAGNOSIS — M1711 Unilateral primary osteoarthritis, right knee: Secondary | ICD-10-CM | POA: Diagnosis present

## 2023-08-01 DIAGNOSIS — E871 Hypo-osmolality and hyponatremia: Secondary | ICD-10-CM | POA: Diagnosis not present

## 2023-08-01 DIAGNOSIS — Z8719 Personal history of other diseases of the digestive system: Secondary | ICD-10-CM | POA: Diagnosis not present

## 2023-08-01 DIAGNOSIS — Z91138 Patient's unintentional underdosing of medication regimen for other reason: Secondary | ICD-10-CM

## 2023-08-01 DIAGNOSIS — I5032 Chronic diastolic (congestive) heart failure: Secondary | ICD-10-CM | POA: Diagnosis present

## 2023-08-01 DIAGNOSIS — R200 Anesthesia of skin: Secondary | ICD-10-CM

## 2023-08-01 DIAGNOSIS — Z79890 Hormone replacement therapy (postmenopausal): Secondary | ICD-10-CM | POA: Diagnosis not present

## 2023-08-01 DIAGNOSIS — Z7901 Long term (current) use of anticoagulants: Secondary | ICD-10-CM

## 2023-08-01 DIAGNOSIS — Z96652 Presence of left artificial knee joint: Secondary | ICD-10-CM | POA: Diagnosis not present

## 2023-08-01 DIAGNOSIS — R202 Paresthesia of skin: Secondary | ICD-10-CM | POA: Diagnosis not present

## 2023-08-01 DIAGNOSIS — N1832 Chronic kidney disease, stage 3b: Secondary | ICD-10-CM | POA: Diagnosis not present

## 2023-08-01 DIAGNOSIS — Z888 Allergy status to other drugs, medicaments and biological substances status: Secondary | ICD-10-CM | POA: Diagnosis not present

## 2023-08-01 DIAGNOSIS — Z8249 Family history of ischemic heart disease and other diseases of the circulatory system: Secondary | ICD-10-CM | POA: Diagnosis not present

## 2023-08-01 DIAGNOSIS — M8580 Other specified disorders of bone density and structure, unspecified site: Secondary | ICD-10-CM | POA: Diagnosis not present

## 2023-08-01 DIAGNOSIS — Z79899 Other long term (current) drug therapy: Secondary | ICD-10-CM

## 2023-08-01 DIAGNOSIS — T500X6A Underdosing of mineralocorticoids and their antagonists, initial encounter: Secondary | ICD-10-CM | POA: Diagnosis not present

## 2023-08-01 DIAGNOSIS — E039 Hypothyroidism, unspecified: Secondary | ICD-10-CM | POA: Insufficient documentation

## 2023-08-01 DIAGNOSIS — E785 Hyperlipidemia, unspecified: Secondary | ICD-10-CM | POA: Diagnosis not present

## 2023-08-01 DIAGNOSIS — Z823 Family history of stroke: Secondary | ICD-10-CM | POA: Diagnosis not present

## 2023-08-01 DIAGNOSIS — I4821 Permanent atrial fibrillation: Secondary | ICD-10-CM | POA: Diagnosis not present

## 2023-08-01 DIAGNOSIS — Z8679 Personal history of other diseases of the circulatory system: Secondary | ICD-10-CM | POA: Diagnosis not present

## 2023-08-01 DIAGNOSIS — K219 Gastro-esophageal reflux disease without esophagitis: Secondary | ICD-10-CM | POA: Diagnosis not present

## 2023-08-01 DIAGNOSIS — T461X5A Adverse effect of calcium-channel blockers, initial encounter: Secondary | ICD-10-CM | POA: Diagnosis not present

## 2023-08-01 DIAGNOSIS — I130 Hypertensive heart and chronic kidney disease with heart failure and stage 1 through stage 4 chronic kidney disease, or unspecified chronic kidney disease: Secondary | ICD-10-CM | POA: Diagnosis present

## 2023-08-01 DIAGNOSIS — I10 Essential (primary) hypertension: Secondary | ICD-10-CM | POA: Diagnosis not present

## 2023-08-01 DIAGNOSIS — I5033 Acute on chronic diastolic (congestive) heart failure: Secondary | ICD-10-CM | POA: Diagnosis not present

## 2023-08-01 DIAGNOSIS — N1831 Chronic kidney disease, stage 3a: Secondary | ICD-10-CM | POA: Diagnosis not present

## 2023-08-01 DIAGNOSIS — Z6841 Body Mass Index (BMI) 40.0 and over, adult: Secondary | ICD-10-CM | POA: Diagnosis not present

## 2023-08-01 DIAGNOSIS — I480 Paroxysmal atrial fibrillation: Secondary | ICD-10-CM | POA: Diagnosis not present

## 2023-08-01 DIAGNOSIS — I5022 Chronic systolic (congestive) heart failure: Secondary | ICD-10-CM | POA: Diagnosis not present

## 2023-08-01 DIAGNOSIS — D6869 Other thrombophilia: Secondary | ICD-10-CM | POA: Diagnosis not present

## 2023-08-01 DIAGNOSIS — R9431 Abnormal electrocardiogram [ECG] [EKG]: Secondary | ICD-10-CM | POA: Diagnosis not present

## 2023-08-01 DIAGNOSIS — I3139 Other pericardial effusion (noninflammatory): Secondary | ICD-10-CM | POA: Diagnosis not present

## 2023-08-01 DIAGNOSIS — I2721 Secondary pulmonary arterial hypertension: Secondary | ICD-10-CM | POA: Diagnosis not present

## 2023-08-01 LAB — SODIUM, URINE, RANDOM: Sodium, Ur: 58 mmol/L

## 2023-08-01 LAB — BASIC METABOLIC PANEL
Anion gap: 16 — ABNORMAL HIGH (ref 5–15)
BUN: 18 mg/dL (ref 8–23)
CO2: 22 mmol/L (ref 22–32)
Calcium: 9.5 mg/dL (ref 8.9–10.3)
Chloride: 85 mmol/L — ABNORMAL LOW (ref 98–111)
Creatinine, Ser: 1.26 mg/dL — ABNORMAL HIGH (ref 0.44–1.00)
GFR, Estimated: 44 mL/min — ABNORMAL LOW (ref >60)
Glucose, Bld: 113 mg/dL — ABNORMAL HIGH (ref 70–99)
Potassium: 4.3 mmol/L (ref 3.5–5.1)
Sodium: 123 mmol/L — ABNORMAL LOW (ref 135–145)

## 2023-08-01 LAB — CBC
HCT: 42.5 % (ref 36.0–46.0)
Hemoglobin: 14.5 g/dL (ref 12.0–15.0)
MCH: 28.2 pg (ref 26.0–34.0)
MCHC: 34.1 g/dL (ref 30.0–36.0)
MCV: 82.5 fL (ref 80.0–100.0)
Platelets: 282 10*3/uL (ref 150–400)
RBC: 5.15 MIL/uL — ABNORMAL HIGH (ref 3.87–5.11)
RDW: 13.7 % (ref 11.5–15.5)
WBC: 9.4 10*3/uL (ref 4.0–10.5)
nRBC: 0 % (ref 0.0–0.2)

## 2023-08-01 LAB — COMPREHENSIVE METABOLIC PANEL
ALT: 17 U/L (ref 0–44)
AST: 21 U/L (ref 15–41)
Albumin: 3.9 g/dL (ref 3.5–5.0)
Alkaline Phosphatase: 55 U/L (ref 38–126)
Anion gap: 8 (ref 5–15)
BUN: 18 mg/dL (ref 8–23)
CO2: 23 mmol/L (ref 22–32)
Calcium: 8.8 mg/dL — ABNORMAL LOW (ref 8.9–10.3)
Chloride: 90 mmol/L — ABNORMAL LOW (ref 98–111)
Creatinine, Ser: 1.29 mg/dL — ABNORMAL HIGH (ref 0.44–1.00)
GFR, Estimated: 42 mL/min — ABNORMAL LOW (ref >60)
Glucose, Bld: 115 mg/dL — ABNORMAL HIGH (ref 70–99)
Potassium: 4.5 mmol/L (ref 3.5–5.1)
Sodium: 121 mmol/L — ABNORMAL LOW (ref 135–145)
Total Bilirubin: 0.9 mg/dL (ref 0.3–1.2)
Total Protein: 7 g/dL (ref 6.5–8.1)

## 2023-08-01 LAB — URINALYSIS, ROUTINE W REFLEX MICROSCOPIC
Bilirubin Urine: NEGATIVE
Glucose, UA: NEGATIVE mg/dL
Hgb urine dipstick: NEGATIVE
Ketones, ur: NEGATIVE mg/dL
Leukocytes,Ua: NEGATIVE
Nitrite: NEGATIVE
Protein, ur: NEGATIVE mg/dL
Specific Gravity, Urine: 1.005 (ref 1.005–1.030)
pH: 7 (ref 5.0–8.0)

## 2023-08-01 LAB — OSMOLALITY: Osmolality: 267 mOsm/kg — ABNORMAL LOW (ref 275–295)

## 2023-08-01 LAB — VITAMIN B12: Vitamin B-12: 3178 pg/mL — ABNORMAL HIGH (ref 180–914)

## 2023-08-01 LAB — OSMOLALITY, URINE: Osmolality, Ur: 226 mOsm/kg — ABNORMAL LOW (ref 300–900)

## 2023-08-01 LAB — FOLATE: Folate: 15.1 ng/mL (ref >5.9)

## 2023-08-01 LAB — TSH: TSH: 1.096 u[IU]/mL (ref 0.350–4.500)

## 2023-08-01 MED ORDER — SODIUM CHLORIDE 0.9 % IV SOLN: 250.0000 mL | INTRAVENOUS | Status: DC | PRN

## 2023-08-01 MED ORDER — ACETAMINOPHEN 325 MG PO TABS: 650.0000 mg | ORAL_TABLET | Freq: Four times a day (QID) | ORAL | Status: DC | PRN

## 2023-08-01 MED ORDER — HYDRALAZINE HCL 20 MG/ML IJ SOLN: 5.0000 mg | Freq: Four times a day (QID) | INTRAMUSCULAR | Status: DC | PRN

## 2023-08-01 MED ORDER — LEVOTHYROXINE SODIUM 25 MCG PO TABS
125.0000 ug | ORAL_TABLET | Freq: Every day | ORAL | Status: DC
  Administered 2023-08-02 – 2023-08-04 (×3): 125 ug via ORAL
  Filled 2023-08-01 (×3): qty 1

## 2023-08-01 MED ORDER — AMLODIPINE BESYLATE 5 MG PO TABS
5.0000 mg | ORAL_TABLET | Freq: Every day | ORAL | Status: DC
  Administered 2023-08-02: 5 mg via ORAL
  Filled 2023-08-01: qty 1

## 2023-08-01 MED ORDER — SODIUM CHLORIDE 0.9% FLUSH
3.0000 mL | Freq: Two times a day (BID) | INTRAVENOUS | Status: DC
  Administered 2023-08-01 – 2023-08-04 (×4): 3 mL via INTRAVENOUS

## 2023-08-01 MED ORDER — METOPROLOL TARTRATE 50 MG PO TABS
50.0000 mg | ORAL_TABLET | Freq: Two times a day (BID) | ORAL | Status: DC
  Administered 2023-08-01 – 2023-08-03 (×4): 50 mg via ORAL
  Filled 2023-08-01 (×4): qty 1

## 2023-08-01 MED ORDER — SODIUM CHLORIDE 0.9 % IV SOLN: INTRAVENOUS | Status: AC

## 2023-08-01 MED ORDER — APIXABAN 5 MG PO TABS
5.0000 mg | ORAL_TABLET | Freq: Two times a day (BID) | ORAL | Status: DC
  Administered 2023-08-01 – 2023-08-06 (×10): 5 mg via ORAL
  Filled 2023-08-01 (×10): qty 1

## 2023-08-01 MED ORDER — PANTOPRAZOLE SODIUM 40 MG PO TBEC
40.0000 mg | DELAYED_RELEASE_TABLET | Freq: Every day | ORAL | Status: DC
  Administered 2023-08-01 – 2023-08-06 (×6): 40 mg via ORAL
  Filled 2023-08-01 (×6): qty 1

## 2023-08-01 MED ORDER — PRAVASTATIN SODIUM 40 MG PO TABS
80.0000 mg | ORAL_TABLET | Freq: Every morning | ORAL | Status: DC
  Administered 2023-08-02 – 2023-08-06 (×5): 80 mg via ORAL
  Filled 2023-08-01 (×5): qty 2

## 2023-08-01 MED ORDER — SODIUM CHLORIDE 0.9% FLUSH: 3.0000 mL | INTRAVENOUS | Status: DC | PRN

## 2023-08-01 NOTE — ED Provider Notes (Signed)
[place] EMERGENCY DEPARTMENT AT [facility] Provider Note   CSN: [id number] Arrival date & time: 08/01/23  1530     History  Chief Complaint  Patient presents with   Abnormal labs    [name] is a 77 y.o. female.  77 year old female with past medical history significant for CHF presents today for concern of hyponatremia.  Labs were checked at PCP clinic today with sodium level of 121.  Patient reports pain to her lower extremities for the past 2 weeks.  She is also states that she started her diuretics 2 weeks ago due to volume overload.  She took her spironolactone and Lasix every other day.  She is aware that she should be taken spironolactone every day which she discovered today.  Denies shortness of breath, confusion.  Family is at bedside and confirms the story.  The history is provided by the patient. No language interpreter was used.       Home Medications Prior to Admission medications   Medication Sig Start Date End Date Taking? Authorizing Provider  acetaminophen (TYLENOL) 325 MG tablet Take 325-650 mg by mouth at bedtime as needed for mild pain.    [provider]  amLODipine (NORVASC) 5 MG tablet TAKE 1 TABLET (5 MG TOTAL) BY MOUTH DAILY. 03/11/23 07/09/23  Patwardhan, Anabel Bene, MD  Calcium Carb-Cholecalciferol (CALTRATE 600+D3 PO) Take 1 tablet by mouth every evening.    [provider]  CVS IRON 325 (65 Fe) MG tablet Take 325 mg by mouth daily at 6 PM. 01/16/21   [provider]  ELIQUIS 5 MG TABS tablet TAKE 1 TABLET BY MOUTH TWICE A DAY 07/07/23   Patwardhan, Manish J, MD  levothyroxine (SYNTHROID, LEVOTHROID) 125 MCG tablet Take 125 mcg by mouth daily before breakfast. 06/19/12   [provider]  metoprolol tartrate (LOPRESSOR) 50 MG tablet TAKE 1 TABLET BY MOUTH TWICE A DAY 06/09/23   Patwardhan, Manish J, MD  omeprazole (PRILOSEC) 20 MG capsule TAKE 1 CAPSULE BY MOUTH EVERY DAY 12/21/21   Hilarie Fredrickson, MD  pravastatin  (PRAVACHOL) 80 MG tablet Take 80 mg by mouth in the morning. 07/16/12   [provider]  vitamin B-12 (CYANOCOBALAMIN) 1000 MCG tablet Take 1,000 mcg by mouth daily. 01/16/21   [provider]      Allergies    Atorvastatin    Review of Systems   Review of Systems  Constitutional:  Negative for chills and fever.  Respiratory:  Negative for shortness of breath.   Cardiovascular:  Negative for chest pain.  Gastrointestinal:  Negative for abdominal pain.  Neurological:  Negative for light-headedness.  Psychiatric/Behavioral:  Negative for confusion.   All other systems reviewed and are negative.   Physical Exam Updated Vital Signs BP (!) 146/99 (BP Location: Left Arm)   Pulse 98   Temp (!) 97.5 F (36.4 C) (Oral)   Resp 16   Ht 5\' 6"  (1.676 m)   Wt 131.5 kg   SpO2 98%   BMI 46.81 kg/m  Physical Exam Vitals and nursing note reviewed.  Constitutional:      General: She is not in acute distress.    Appearance: Normal appearance. She is not ill-appearing.  HENT:     Head: Normocephalic and atraumatic.     Nose: Nose normal.  Eyes:     Conjunctiva/sclera: Conjunctivae normal.  Cardiovascular:     Rate and Rhythm: Normal rate.  Pulmonary:     Effort: Pulmonary effort is  normal. No respiratory distress.     Breath sounds: No wheezing.  Musculoskeletal:        General: No deformity. Normal range of motion.     Cervical back: Normal range of motion.  Skin:    Findings: No rash.  Neurological:     General: No focal deficit present.     Mental Status: She is alert and oriented to person, place, and time. Mental status is at baseline.     ED Results / Procedures / Treatments   Labs (all labs ordered are listed, but only abnormal results are displayed) Labs Reviewed  COMPREHENSIVE METABOLIC PANEL - Abnormal; Notable for the following components:      Result Value   Sodium 121 (*)    Chloride 90 (*)    Glucose, Bld 115 (*)    Creatinine, Ser 1.29 (*)     Calcium 8.8 (*)    GFR, Estimated 42 (*)    All other components within normal limits  CBC - Abnormal; Notable for the following components:   RBC 5.15 (*)    All other components within normal limits  URINALYSIS, ROUTINE W REFLEX MICROSCOPIC    EKG None  Radiology No results found.  Procedures Procedures    Medications Ordered in ED Medications - No data to display  ED Course/ Medical Decision Making/ A&P                                 Medical Decision Making Amount and/or Complexity of Data Reviewed Labs: ordered.  Risk Decision regarding hospitalization.   77 year old female with past medical history as above presents today for concern of hyponatremia.  Sodium level of 121 today.  She is grossly asymptomatic.  She started her diuretic 2 weeks ago.  She states her peripheral edema has significantly improved over the past 2 weeks.  Suspect that her hyponatremia secondary to diuresis.  Given her history of CHF will not provide any fluids.  Will discuss with hospitalist for admission.  Discussed with hospitalist.  They will evaluate patient for admission.  Final Clinical Impression(s) / ED Diagnoses Final diagnoses:  Hyponatremia    Rx / DC Orders ED Discharge Orders     None         Marita Kansas, PA-C 08/01/23 2037    Lonell Grandchild, MD 08/01/23 [phone]

## 2023-08-01 NOTE — ED Triage Notes (Signed)
Pt came in via POV d/t her PCP reporting to her that her Sodium was 121 & that lab work was drawn at 1330 today & she reports a few weeks ago those levels were good. A/Ox4, reports that she has been having pain/burning in her legs the past few weeks is why she saw her PCP anyway & they started her on lasix & spironolactone. Does have Hx of CHF.

## 2023-08-01 NOTE — H&P (Addendum)
History and Physical    [name] BJY:[id number] DOB: 1945-12-26 DOA: 08/01/2023  PCP: Creola Corn, MD   Patient coming from: Home   Chief Complaint:  Chief Complaint  Patient presents with   Abnormal labs    HPI:  [name] is a 77 y.o. female with medical history significant of pericardial effusion status post pericardiocentesis in February 2022, CKD stage IIIb, permanent atrial fibrillation on Eliquis, hypothyroidism, GERD, hyperlipidemia and chronic diastolic heart failure who has been presented to emergency department for evaluation of low sodium.  Patient had a lab work outpatient today showed sodium 121.  Patient is also reporting burning pain and burning sensation of her leg for past few days that is why she went to see her primary care doctor and they have been started on Lasix and spirolactone.  Patient reported taking Lasix every other day and spironolactone daily. During my evaluation patient denies any shortness of breath, cough, headache, abdominal pain, nausea, vomiting and diarrhea.  She has been able to lower extremity swelling has been improved since she is taking the Lasix and spironolactone.  Patient denies any dizziness, sweating, tremor and confusion.  She is only complaining about bilateral lower extremity tingling sensation and cramping for last few days.  Patient's family member at the bedside they also denied noticing any confusion inpatient.  Patient is alert and oriented.   ED Course:  At present patient to ED patient's temperature found slightly low 97.5 F, blood pressure 146/99, respiratory 16 and O2 sat 98% room air.  CMP showed low sodium 121 (patient has baseline sodium is around 130-134), low chloride 90, bicarb 23, blood glucose 115, BUN 18, creatinine 1.29 at the baseline and GFR 42.  CBC grossly unremarkable.  Hospitalist has been contacted for management of hyponatremia.   Review of Systems:  Review of Systems  Constitutional:  Negative  for chills, fever, malaise/fatigue and weight loss.  HENT:  Negative for hearing loss and tinnitus.   Eyes:  Negative for blurred vision.  Respiratory:  Negative for cough, hemoptysis, sputum production and shortness of breath.   Cardiovascular:  Negative for chest pain, palpitations, orthopnea, claudication and leg swelling.  Gastrointestinal:  Negative for abdominal pain, heartburn, nausea and vomiting.  Genitourinary:  Negative for dysuria and urgency.  Musculoskeletal:  Negative for myalgias and neck pain.  Skin:  Negative for rash.  Neurological:  Negative for dizziness, tingling, tremors, sensory change, seizures, weakness and headaches.  Psychiatric/Behavioral:  The patient is not nervous/anxious.     Past Medical History:  Diagnosis Date   Arthritis    Atrial fibrillation (HCC)    CHF (congestive heart failure) (HCC)    CKD (chronic kidney disease), stage III (HCC)    Colon polyps    Dysrhythmia    new onset afib    Goiter    Hyperglycemia    Hyperlipidemia    Hypertension    Hypothyroidism    Morbid obesity (HCC)    Osteopenia     Past Surgical History:  Procedure Laterality Date   CARDIOVASCULAR STRESS TEST     COLONOSCOPY     IR THORACENTESIS ASP PLEURAL SPACE W/IMG GUIDE  01/26/2021   LUMBAR DISC SURGERY  2012   PERICARDIOCENTESIS N/A 01/24/2021   Procedure: PERICARDIOCENTESIS;  Surgeon: Yates Decamp, MD;  Location: [facility] INVASIVE CV LAB;  Service: Cardiovascular;  Laterality: N/A;   POLYPECTOMY     TOTAL KNEE ARTHROPLASTY Left 05/29/2017   Procedure: LEFT TOTAL KNEE ARTHROPLASTY;  Surgeon: Roda Shutters,  Edwin Cap, MD;  Location: [facility] OR;  Service: Orthopedics;  Laterality: Left;   TUBAL LIGATION  1977   UPPER GASTROINTESTINAL ENDOSCOPY  12/29/2019   US ECHOCARDIOGRAPHY       reports that she has never smoked. She has never used smokeless tobacco. She reports that she does not drink alcohol and does not use drugs.  Allergies  Allergen Reactions   Atorvastatin Other (See  Comments)    Aches     Family History  Problem Relation Age of Onset   CVA Mother    Heart disease Brother    Heart disease Father    Colon cancer Neg Hx    Stomach cancer Neg Hx    Colon polyps Neg Hx    Esophageal cancer Neg Hx    Rectal cancer Neg Hx     Prior to Admission medications   Medication Sig Start Date End Date Taking? Authorizing Provider  acetaminophen (TYLENOL) 325 MG tablet Take 325-650 mg by mouth at bedtime as needed for mild pain.    [provider]  amLODipine (NORVASC) 5 MG tablet TAKE 1 TABLET (5 MG TOTAL) BY MOUTH DAILY. 03/11/23 07/09/23  Patwardhan, Anabel Bene, MD  Calcium Carb-Cholecalciferol (CALTRATE 600+D3 PO) Take 1 tablet by mouth every evening.    [provider]  [employer] IRON 325 (65 Fe) MG tablet Take 325 mg by mouth daily at 6 PM. 01/16/21   [provider]  ELIQUIS 5 MG TABS tablet TAKE 1 TABLET BY MOUTH TWICE A DAY 07/07/23   Patwardhan, Manish J, MD  levothyroxine (SYNTHROID, LEVOTHROID) 125 MCG tablet Take 125 mcg by mouth daily before breakfast. 06/19/12   [provider]  metoprolol tartrate (LOPRESSOR) 50 MG tablet TAKE 1 TABLET BY MOUTH TWICE A DAY 06/09/23   Patwardhan, Manish J, MD  omeprazole (PRILOSEC) 20 MG capsule TAKE 1 CAPSULE BY MOUTH EVERY DAY 12/21/21   Hilarie Fredrickson, MD  pravastatin (PRAVACHOL) 80 MG tablet Take 80 mg by mouth in the morning. 07/16/12   [provider]  vitamin B-12 (CYANOCOBALAMIN) 1000 MCG tablet Take 1,000 mcg by mouth daily. 01/16/21   [provider]     Physical Exam: Vitals:   08/01/23 1550 08/01/23 1551  BP: (!) 146/99   Pulse: 98   Resp: 16   Temp: (!) 97.5 F (36.4 C)   TempSrc: Oral   SpO2: 98%   Weight:  131.5 kg  Height:  5\' 6"  (1.676 m)    Physical Exam Constitutional:      General: She is not in acute distress.    Appearance: She is not ill-appearing.  HENT:     Mouth/Throat:     Mouth: Mucous membranes are moist.  Eyes:     Pupils: Pupils  are equal, round, and reactive to light.  Cardiovascular:     Rate and Rhythm: Normal rate and regular rhythm.     Pulses: Normal pulses.     Heart sounds: Normal heart sounds.  Pulmonary:     Effort: Pulmonary effort is normal.     Breath sounds: Normal breath sounds.  Abdominal:     General: Bowel sounds are normal. There is no distension.     Tenderness: There is no abdominal tenderness.  Musculoskeletal:        General: No swelling.     Cervical back: Neck supple.     Right lower leg: No edema.     Left lower leg: No edema.  Skin:  General: Skin is warm.     Capillary Refill: Capillary refill takes less than 2 seconds.  Neurological:     Mental Status: She is alert and oriented to person, place, and time.     Cranial Nerves: No cranial nerve deficit.     Motor: No weakness.  Psychiatric:        Mood and Affect: Mood normal.        Thought Content: Thought content normal.        Judgment: Judgment normal.      Labs on Admission: I have personally reviewed following labs and imaging studies  CBC: Recent Labs  Lab 08/01/23 1604  WBC 9.4  HGB 14.5  HCT 42.5  MCV 82.5  PLT 282   Basic Metabolic Panel: Recent Labs  Lab 08/01/23 1604  NA 121*  K 4.5  CL 90*  CO2 23  GLUCOSE 115*  BUN 18  CREATININE 1.29*  CALCIUM 8.8*   GFR: Estimated Creatinine Clearance: 50 mL/min (A) (by C-G formula based on SCr of 1.29 mg/dL (H)). Liver Function Tests: Recent Labs  Lab 08/01/23 1604  AST 21  ALT 17  ALKPHOS 55  BILITOT 0.9  PROT 7.0  ALBUMIN 3.9   No results for input(s): "LIPASE", "AMYLASE" in the last 168 hours. No results for input(s): "AMMONIA" in the last 168 hours. Coagulation Profile: No results for input(s): "INR", "PROTIME" in the last 168 hours. Cardiac Enzymes: No results for input(s): "CKTOTAL", "CKMB", "CKMBINDEX", "TROPONINI", "TROPONINIHS" in the last 168 hours. BNP (last 3 results) No results for input(s): "BNP" in the last 8760  hours. HbA1C: No results for input(s): "HGBA1C" in the last 72 hours. CBG: No results for input(s): "GLUCAP" in the last 168 hours. Lipid Profile: No results for input(s): "CHOL", "HDL", "LDLCALC", "TRIG", "CHOLHDL", "LDLDIRECT" in the last 72 hours. Thyroid Function Tests: No results for input(s): "TSH", "T4TOTAL", "FREET4", "T3FREE", "THYROIDAB" in the last 72 hours. Anemia Panel: No results for input(s): "VITAMINB12", "FOLATE", "FERRITIN", "TIBC", "IRON", "RETICCTPCT" in the last 72 hours. Urine analysis:    Component Value Date/Time   COLORURINE YELLOW 06/19/2021 1844   APPEARANCEUR HAZY (A) 06/19/2021 1844   LABSPEC 1.009 06/19/2021 1844   PHURINE 5.0 06/19/2021 1844   GLUCOSEU NEGATIVE 06/19/2021 1844   HGBUR NEGATIVE 06/19/2021 1844   BILIRUBINUR NEGATIVE 06/19/2021 1844   KETONESUR NEGATIVE 06/19/2021 1844   PROTEINUR NEGATIVE 06/19/2021 1844   UROBILINOGEN 0.2 01/31/2011 1208   NITRITE NEGATIVE 06/19/2021 1844   LEUKOCYTESUR SMALL (A) 06/19/2021 1844    Radiological Exams on Admission: I have personally reviewed images No results found.  EKG: My personal interpretation of EKG shows: Pending EKG.    Assessment/Plan: Principal Problem:   Hyponatremia Active Problems:   Chronic diastolic CHF (congestive heart failure) (HCC)   CKD stage 3b, GFR 30-44 ml/min (HCC)   History of CHF (congestive heart failure)   Permanent atrial fibrillation (HCC)   Hypothyroidism   GERD (gastroesophageal reflux disease)    Assessment and Plan: Euvolemic hyponatremia - Patient coming with complaining of bilateral lower extremity tingling and cramping for last few days.  Initially she had lower extremity swelling and patient being started on Lasix 20 mg daily and spironolactone 25 mg daily 2 weeks ago.  She went to check lab work outpatient which showed sodium low 121.  Referred to ED for evaluation - Lab work in the ED showed sodium 121 (baseline sodium is around 1 31-1  34) -Patient is euvolemic on physical exam.  I  believe she has developed hyponatremia secondary from the side effect of Lasix and spironolactone.  Holding Lasix spironolactone for now. - Checking serum osmolarity, urine osmolarity, urine sodium level and TSH level. - Checking BMP every 6 hours for next 2 days. - Starting gentle hydration with NS 50 cc/h for 10 hours. -Goal to improve sodium 8 to 10 mEq over the course of next 24 hours until 4 PM 08/03/2023. -Continue neurocheck every 4 hours. - Continue cardiac monitoring - Admitting patient to medical-telemetry unit.  History of diastolic heart failure with preserved EF Essential hypertension -Chart review previous echocardiogram 08/2021 showed EF 50 to 55%, unable to evaluate diastolic function due to atrial fibrillation. -At home for blood pressure management patient takes amlodipine 5 mg daily, spironolactone 25 mg daily, Lasix 20 mg daily. -Plan to continue amlodipine 5 mg for now.  Holding Lasix and spironolactone in the setting of hyponatremia. -Continue hydralazine as needed.  CKD stage IIIb -Creatinine 1.29 and GFR 42 which is at patient baseline. - Continue to monitor renal function. - Avoid nephrotoxic agent.   Permanent atrial fibrillation on Eliquis - CHA2DS2-VASc score = 5, which place patient risk for development of a stroke 7.2% and only. -Continue Eliquis 5 mg twice daily and Lopressor 50 mg twice daily.  Hypothyroidism -Continue levothyroxine 125 mcg daily.  GERD -Continue Protonix 40 mg daily  Hyperlipidemia - Continue pravastatin 80 mg daily  History of osteoarthritis of bilateral knee joint Bilateral lower extremities cramping and tingling -Patient reporting bilateral lower extremities cramping and tingling for last few days likely secondary from in the setting of hyponatremia.  She uses walker at the baseline. -Checking folic acid and vitamin B12 level. -Continue fall precaution. -Consulted inpatient  physical therapy for evaluation.   DVT prophylaxis:  Eliquis Code Status:  Full Code.  Discussed CODE STATUS both with patient and with family at the bedside. Diet: Heart healthy diet fluid restriction 2 L/day Family Communication: Discussed treatment plan with patient's family at the bedside Disposition Plan: Pending improvement of sodium level.  Pending clinical disposition Consults: Physical therapy Admission status:   Inpatient, Telemetry bed  Severity of Illness: The appropriate patient status for this patient is INPATIENT. Inpatient status is judged to be reasonable and necessary in order to provide the required intensity of service to ensure the patient's safety. The patient's presenting symptoms, physical exam findings, and initial radiographic and laboratory data in the context of their chronic comorbidities is felt to place them at high risk for further clinical deterioration. Furthermore, it is not anticipated that the patient will be medically stable for discharge from the hospital within 2 midnights of admission.   * I certify that at the point of admission it is my clinical judgment that the patient will require inpatient hospital care spanning beyond 2 midnights from the point of admission due to high intensity of service, high risk for further deterioration and high frequency of surveillance required.Marland Kitchen    Tereasa Coop, MD [facility]  How to contact the [facility] Attending or Consulting provider 7A - 7P or covering provider during after hours 7P -7A, for this patient.  Check the care team in [facility] and look for a) attending/consulting [facility] provider listed and b) the [facility] team listed Log into [email or web address] and use [place]'s universal password to access. If you do not have the password, please contact the hospital operator. Locate the [facility] provider you are looking for under [facility] and page to a number that you can be directly  reached. If you still have difficulty  reaching the provider, please page the [facility] (Director on Call) for the Hospitalists listed on amion for assistance.  08/01/2023, 8:18 PM

## 2023-08-01 NOTE — ED Notes (Signed)
ED TO INPATIENT HANDOFF REPORT  ED Nurse Name and Phone #: Randall Hiss [phone]  S Name/Age/Gender [name] 77 y.o. female Room/Bed: 028C/028C  Code Status   Code Status: Full Code  Home/SNF/Other Home Patient oriented to: self, place, time, and situation Is this baseline? Yes   Triage Complete: Triage complete  Chief Complaint Hyponatremia [E87.1]  Triage Note Pt came in via POV d/t her PCP reporting to her that her Sodium was 121 & that lab work was drawn at 1330 today & she reports a few weeks ago those levels were good. A/Ox4, reports that she has been having pain/burning in her legs the past few weeks is why she saw her PCP anyway & they started her on lasix & spironolactone. Does have Hx of CHF.   Allergies Allergies  Allergen Reactions   Atorvastatin Other (See Comments)    Aches     Level of Care/Admitting Diagnosis ED Disposition     ED Disposition  Admit   Condition  --   Comment  Hospital Area: [facility] [[id number]]  Level of Care: Telemetry Medical [104]  May admit patient to [facility] or [facility] if equivalent level of care is available:: No  Covid Evaluation: Asymptomatic - no recent exposure (last 10 days) testing not required  Diagnosis: Hyponatremia [198519]  Admitting Physician: Tereasa Coop [[id number]]  Attending Physician: Tereasa Coop [[id number]]  Certification:: I certify this patient will need inpatient services for at least 2 midnights  Expected Medical Readiness: 08/04/2023          B Medical/Surgery History Past Medical History:  Diagnosis Date   Arthritis    Atrial fibrillation (HCC)    CHF (congestive heart failure) (HCC)    CKD (chronic kidney disease), stage III (HCC)    Colon polyps    Dysrhythmia    new onset afib    Goiter    Hyperglycemia    Hyperlipidemia    Hypertension    Hypothyroidism    Morbid obesity (HCC)    Osteopenia    Past Surgical History:  Procedure Laterality  Date   CARDIOVASCULAR STRESS TEST     COLONOSCOPY     IR THORACENTESIS ASP PLEURAL SPACE W/IMG GUIDE  01/26/2021   LUMBAR DISC SURGERY  2012   PERICARDIOCENTESIS N/A 01/24/2021   Procedure: PERICARDIOCENTESIS;  Surgeon: Yates Decamp, MD;  Location: [facility] INVASIVE CV LAB;  Service: Cardiovascular;  Laterality: N/A;   POLYPECTOMY     TOTAL KNEE ARTHROPLASTY Left 05/29/2017   Procedure: LEFT TOTAL KNEE ARTHROPLASTY;  Surgeon: Tarry Kos, MD;  Location: [facility] OR;  Service: Orthopedics;  Laterality: Left;   TUBAL LIGATION  1977   UPPER GASTROINTESTINAL ENDOSCOPY  12/29/2019   US ECHOCARDIOGRAPHY       A IV Location/Drains/Wounds Patient Lines/Drains/Airways Status     Active Line/Drains/Airways     Name Placement date Placement time Site Days   Peripheral IV 08/01/23 20 G Left Antecubital 08/01/23  2042  Antecubital  less than 1            Intake/Output Last 24 hours No intake or output data in the 24 hours ending 08/01/23 2053  Labs/Imaging Results for orders placed or performed during the hospital encounter of 08/01/23 (from the past 48 hour(s))  Comprehensive metabolic panel     Status: Abnormal   Collection Time: 08/01/23  4:04 PM  Result Value Ref Range   Sodium 121 (L) 135 - 145 mmol/L   Potassium 4.5 3.5 -  5.1 mmol/L   Chloride 90 (L) 98 - 111 mmol/L   CO2 23 22 - 32 mmol/L   Glucose, Bld 115 (H) 70 - 99 mg/dL    Comment: Glucose reference range applies only to samples taken after fasting for at least 8 hours.   BUN 18 8 - 23 mg/dL   Creatinine, Ser 4.09 (H) 0.44 - 1.00 mg/dL   Calcium 8.8 (L) 8.9 - 10.3 mg/dL   Total Protein 7.0 6.5 - 8.1 g/dL   Albumin 3.9 3.5 - 5.0 g/dL   AST 21 15 - 41 U/L   ALT 17 0 - 44 U/L   Alkaline Phosphatase 55 38 - 126 U/L   Total Bilirubin 0.9 0.3 - 1.2 mg/dL   GFR, Estimated 42 (L) >60 mL/min    Comment: (NOTE) Calculated using the CKD-EPI Creatinine Equation (2021)    Anion gap 8 5 - 15    Comment: Performed at [facility]  Lab, [address], [place], [place] [address]  CBC     Status: Abnormal   Collection Time: 08/01/23  4:04 PM  Result Value Ref Range   WBC 9.4 4.0 - 10.5 K/uL   RBC 5.15 (H) 3.87 - 5.11 MIL/uL   Hemoglobin 14.5 12.0 - 15.0 g/dL   HCT 47.8 29.5 - 62.1 %   MCV 82.5 80.0 - 100.0 fL   MCH 28.2 26.0 - 34.0 pg   MCHC 34.1 30.0 - 36.0 g/dL   RDW 30.8 65.7 - 84.6 %   Platelets 282 150 - 400 K/uL   nRBC 0.0 0.0 - 0.2 %    Comment: Performed at [facility] Lab, [address], [place], [place] [address]   No results found.  Pending Labs Unresulted Labs (From admission, onward)     Start     Ordered   08/02/23 0500  CBC  Daily,   R      08/01/23 2004   08/01/23 2055  Vitamin B12  Once,   AD        08/01/23 2055   08/01/23 2055  Folate  Once,   AD        08/01/23 2055   08/01/23 2055  TSH  Once,   AD        08/01/23 2055   08/01/23 2004  Basic metabolic panel  Now then every 6 hours,   R (with TIMED occurrences)      08/01/23 2003   08/01/23 2004  Sodium, urine, random  Once,   R        08/01/23 2003   08/01/23 2004  Osmolality  Add-on,   AD        08/01/23 2003   08/01/23 2004  Osmolality, urine  Once,   R        08/01/23 2003   08/01/23 1927  Urinalysis, Routine w reflex microscopic -Urine, Clean Catch  Once,   URGENT       Question:  Specimen Source  Answer:  Urine, Clean Catch   08/01/23 1927            Vitals/Pain Today's Vitals   08/01/23 1550 08/01/23 1551 08/01/23 2044  BP: (!) 146/99    Pulse: 98    Resp: 16    Temp: (!) 97.5 F (36.4 C)  97.9 F (36.6 C)  TempSrc: Oral  Axillary  SpO2: 98%    Weight:  131.5 kg   Height:  5\' 6"  (1.676 m)   PainSc:  0-No  pain     Isolation Precautions No active isolations  Medications Medications  amLODipine (NORVASC) tablet 5 mg (has no administration in time range)  metoprolol tartrate (LOPRESSOR) tablet 50 mg (has no administration in time range)  pravastatin (PRAVACHOL) tablet 80 mg (has no administration in time  range)  levothyroxine (SYNTHROID) tablet 125 mcg (has no administration in time range)  pantoprazole (PROTONIX) EC tablet 40 mg (40 mg Oral Given 08/01/23 2025)  apixaban (ELIQUIS) tablet 5 mg (has no administration in time range)  sodium chloride flush (NS) 0.9 % injection 3 mL (has no administration in time range)  sodium chloride flush (NS) 0.9 % injection 3 mL (has no administration in time range)  0.9 %  sodium chloride infusion (has no administration in time range)  0.9 %  sodium chloride infusion ( Intravenous New Bag/Given 08/01/23 2043)  acetaminophen (TYLENOL) tablet 650 mg (has no administration in time range)  hydrALAZINE (APRESOLINE) injection 5 mg (has no administration in time range)    Mobility walks     Focused Assessments Cardiac Assessment Handoff:    No results found for: "CKTOTAL", "CKMB", "CKMBINDEX", "TROPONINI" No results found for: "DDIMER" Does the Patient currently have chest pain? No    R Recommendations: See Admitting Provider Note  Report given to:   Additional Notes: Afib on monitor, A&O, family with patient

## 2023-08-02 DIAGNOSIS — Z8679 Personal history of other diseases of the circulatory system: Secondary | ICD-10-CM | POA: Diagnosis not present

## 2023-08-02 DIAGNOSIS — N1832 Chronic kidney disease, stage 3b: Secondary | ICD-10-CM | POA: Diagnosis not present

## 2023-08-02 DIAGNOSIS — E871 Hypo-osmolality and hyponatremia: Secondary | ICD-10-CM | POA: Diagnosis not present

## 2023-08-02 DIAGNOSIS — I5032 Chronic diastolic (congestive) heart failure: Secondary | ICD-10-CM | POA: Diagnosis not present

## 2023-08-02 LAB — BASIC METABOLIC PANEL
Anion gap: 9 (ref 5–15)
Anion gap: 12 (ref 5–15)
Anion gap: 9 (ref 5–15)
BUN: 15 mg/dL (ref 8–23)
BUN: 13 mg/dL (ref 8–23)
BUN: 19 mg/dL (ref 8–23)
CO2: 22 mmol/L (ref 22–32)
CO2: 21 mmol/L — ABNORMAL LOW (ref 22–32)
CO2: 24 mmol/L (ref 22–32)
Calcium: 9 mg/dL (ref 8.9–10.3)
Calcium: 9.2 mg/dL (ref 8.9–10.3)
Calcium: 9.3 mg/dL (ref 8.9–10.3)
Chloride: 93 mmol/L — ABNORMAL LOW (ref 98–111)
Chloride: 91 mmol/L — ABNORMAL LOW (ref 98–111)
Chloride: 93 mmol/L — ABNORMAL LOW (ref 98–111)
Creatinine, Ser: 1.13 mg/dL — ABNORMAL HIGH (ref 0.44–1.00)
Creatinine, Ser: 1.13 mg/dL — ABNORMAL HIGH (ref 0.44–1.00)
Creatinine, Ser: 1.27 mg/dL — ABNORMAL HIGH (ref 0.44–1.00)
GFR, Estimated: 50 mL/min — ABNORMAL LOW (ref >60)
GFR, Estimated: 50 mL/min — ABNORMAL LOW (ref >60)
GFR, Estimated: 43 mL/min — ABNORMAL LOW (ref >60)
Glucose, Bld: 112 mg/dL — ABNORMAL HIGH (ref 70–99)
Glucose, Bld: 109 mg/dL — ABNORMAL HIGH (ref 70–99)
Glucose, Bld: 103 mg/dL — ABNORMAL HIGH (ref 70–99)
Potassium: 4.2 mmol/L (ref 3.5–5.1)
Potassium: 4.3 mmol/L (ref 3.5–5.1)
Potassium: 4.8 mmol/L (ref 3.5–5.1)
Sodium: 124 mmol/L — ABNORMAL LOW (ref 135–145)
Sodium: 124 mmol/L — ABNORMAL LOW (ref 135–145)
Sodium: 126 mmol/L — ABNORMAL LOW (ref 135–145)

## 2023-08-02 LAB — CBC
HCT: 41.9 % (ref 36.0–46.0)
Hemoglobin: 14 g/dL (ref 12.0–15.0)
MCH: 27 pg (ref 26.0–34.0)
MCHC: 33.4 g/dL (ref 30.0–36.0)
MCV: 80.7 fL (ref 80.0–100.0)
Platelets: 275 10*3/uL (ref 150–400)
RBC: 5.19 MIL/uL — ABNORMAL HIGH (ref 3.87–5.11)
RDW: 13.7 % (ref 11.5–15.5)
WBC: 9.8 10*3/uL (ref 4.0–10.5)
nRBC: 0 % (ref 0.0–0.2)

## 2023-08-02 NOTE — Evaluation (Signed)
Physical Therapy Brief Evaluation and Discharge Note Patient Details Name: [name] MRN: [id number] DOB: 1946/04/07 Today's Date: 08/02/2023   History of Present Illness  77 y.o. female presented to emergency department 08/01/23 for evaluation of low sodium. Reports burning pain bil LEs for several days.  PMH:  chronic diastolic CHF, pericardial effusion status post pericardiocentesis, persistent A fib, CKD  Clinical Impression   Patient evaluated by Physical Therapy with no further acute PT needs identified. All education related to safe use of rollator has been completed and the patient has no further questions. Ambulated 400 ft with rollator on room air with sats 95%.  PT is signing off. Thank you for this referral.        PT Assessment Patient does not need any further PT services  Assistance Needed at Discharge  PRN    Equipment Recommendations None recommended by PT  Recommendations for Other Services       Precautions/Restrictions Precautions Precautions: Fall Precaution Comments: denies falls        Mobility  Bed Mobility       General bed mobility comments: up in recliner  Transfers Overall transfer level: Modified independent Equipment used: Rollator (4 wheels)               General transfer comment: pt with good technique with sit to stand (brakes locked prior); on return to recliner, pt "parked" her rollator ~5 ft from the chair and furniture walked the rest of the way; educated on safest method to reduce fall risk and pt acknowledged understanding    Ambulation/Gait Ambulation/Gait assistance: Modified independent (Device/Increase time) Gait Distance (Feet): 400 Feet Assistive device: Rollator (4 wheels) Gait Pattern/deviations: Step-through pattern, Antalgic, Decreased stride length   General Gait Details: baseline per pt  Home Activity Instructions    Stairs            Modified Rankin (Stroke Patients Only)        Balance  Overall balance assessment: Modified Independent (despite instructions to sit, she stood and removed her hospital socks and don her sneakers (while holding rollator))                        Pertinent Vitals/Pain PT - Brief Vital Signs All Vital Signs Stable: Yes Pain Assessment Pain Assessment: No/denies pain     Home Living Family/patient expects to be discharged to:: Private residence Living Arrangements: Spouse/significant other;Children Available Help at Discharge: Available 24 hours/day Home Environment: Ramped entrance   Home Equipment: Grab bars - tub/shower;Shower seat - built in;Rollator (4 wheels);BSC/3in1;Cane - single point;Other (comment) (adjustable bed)        Prior Function Level of Independence: Independent with assistive device(s) Comments: husband does IADLs    UE/LE Assessment   UE ROM/Strength/Tone/Coordination: WFL    LE ROM/Strength/Tone/Coordination: [facility]      Communication   Communication Communication: Hearing impairment Cueing Techniques: Verbal cues     Cognition Overall Cognitive Status: Appears within functional limits for tasks assessed/performed       General Comments General comments (skin integrity, edema, etc.): Husband present    Exercises     Assessment/Plan    PT Problem List         PT Visit Diagnosis Difficulty in walking, not elsewhere classified (R26.2)    No Skilled PT Patient at baseline level of functioning;Patient is modified independent with all activity/mobility   Co-evaluation  AMPAC 6 Clicks Help needed turning from your back to your side while in a flat bed without using bedrails?: None Help needed moving from lying on your back to sitting on the side of a flat bed without using bedrails?: None Help needed moving to and from a bed to a chair (including a wheelchair)?: None Help needed standing up from a chair using your arms (e.g., wheelchair or bedside chair)?: None Help  needed to walk in hospital room?: None Help needed climbing 3-5 steps with a railing? : A Little 6 Click Score: 23      End of Session Equipment Utilized During Treatment: Gait belt Activity Tolerance: Patient tolerated treatment well Patient left: in chair;with call bell/phone within reach;with family/visitor present Nurse Communication: Mobility status PT Visit Diagnosis: Difficulty in walking, not elsewhere classified (R26.2)     Time: 9147-8295 PT Time Calculation (min) (ACUTE ONLY): 23 min  Charges:   PT Evaluation $PT Eval Low Complexity: 1 Low       Jerolyn Center, PT Acute Rehabilitation Services  Office [phone]   Zena Amos  08/02/2023, 12:00 PM

## 2023-08-02 NOTE — Plan of Care (Signed)
  Problem: Health Behavior/Discharge Planning: Goal: Ability to manage health-related needs will improve Outcome: Progressing   Problem: Education: Goal: Knowledge of General Education information will improve Description: Including pain rating scale, medication(s)/side effects and non-pharmacologic comfort measures Outcome: Progressing   

## 2023-08-02 NOTE — Progress Notes (Addendum)
PROGRESS NOTE        PATIENT DETAILS Name: MONNA SHIELS Age: 77 y.o. Sex: female Date of Birth: 1946/11/27 Admit Date: 08/01/2023 Admitting Physician Tereasa Coop, MD [id number], [name], MD  Brief Summary: Patient is a 77 y.o.  female history of PAF on Eliquis, chronic HFpEF, GERD, hypothyroidism-who apparently started developing lower extremity edema (which she attributes to a medication) recently-following which she was started on Lasix/Aldactone-and was also attempting to keep herself hydrated by drinking fluids/water-presented to the ED on 8/30 after she was found to have hyponatremia.  Significant events: 8/30>> admit to [facility]  Significant studies: None  Significant microbiology data: None  Procedures: None  Consults: None  Subjective: Lying comfortably in bed-denies any chest pain or shortness of breath.  Objective: Vitals: Blood pressure 114/83, pulse 98, temperature 97.6 F (36.4 C), temperature source Axillary, resp. rate 14, height 5\' 6"  (1.676 m), weight 131 kg, SpO2 94%.   Exam: Gen Exam:Alert awake-not in any distress HEENT:atraumatic, normocephalic Chest: B/L clear to auscultation anteriorly CVS:S1S2 regular Abdomen:soft non tender, non distended Extremities: Minimal edema Neurology: Non focal Skin: no rash  Pertinent Labs/Radiology:    Latest Ref Rng & Units 08/02/2023    3:15 AM 08/01/2023    4:04 PM 06/19/2021    4:39 PM  CBC  WBC 4.0 - 10.5 K/uL 9.8  9.4  9.4   Hemoglobin 12.0 - 15.0 g/dL 02.5  85.2  77.8   Hematocrit 36.0 - 46.0 % 41.9  42.5  45.5   Platelets 150 - 400 K/uL 275  282  325     Lab Results  Component Value Date   NA 124 (L) 08/02/2023   K 4.2 08/02/2023   CL 93 (L) 08/02/2023   CO2 22 08/02/2023      Assessment/Plan: Hyponatremia Suspect due to combination excessive free water intake and recent diuretic use Euvolemic on exam Stop IVF-restrict free water/fluid restriction Repeat electrolytes  later today  Addendum Repeat BMET 5 pm Na increased to 126, remains off IVF and on fluid restriction since this am.  Recent worsening lower extremity edema I suspect this is from her being on amlodipine-she apparently was started on diuretics for this reason Hardly any lower extremity edema on my exam today Do not plan to resume amlodipine on discharge.  Chronic HFpEF Euvolemic Holding diuretics  Chronic atrial fibrillation Rate controlled Telemetry monitoring Metoprolol/Eliquis  HTN BP controlled-continue metoprolol Holding amlodipine for now  HLD Statin  Hypothyroidism Levothyroxine TSH on 8/30 stable  GERD PPI  Morbid Obesity: Estimated body mass index is 46.61 kg/m as calculated from the following:   Height as of this encounter: 5\' 6"  (1.676 m).   Weight as of this encounter: 131 kg.   Code status:   Code Status: Full Code   DVT Prophylaxis: SCDs Start: 08/01/23 2004 apixaban (ELIQUIS) tablet 5 mg    Family Communication: None at bedside   Disposition Plan: Status is: Inpatient Remains inpatient appropriate because: Severity of illness   Planned Discharge Destination:Home   Diet: Diet Order             Diet Heart Room service appropriate? Yes; Fluid consistency: Thin; Fluid restriction: 2000 mL Fluid  Diet effective now                     Antimicrobial agents: Anti-infectives (From admission, onward)  None        MEDICATIONS: Scheduled Meds:  amLODipine  5 mg Oral Daily   apixaban  5 mg Oral BID   levothyroxine  125 mcg Oral QAC breakfast   metoprolol tartrate  50 mg Oral BID   pantoprazole  40 mg Oral Daily   pravastatin  80 mg Oral q AM   sodium chloride flush  3 mL Intravenous Q12H   Continuous Infusions:  sodium chloride     PRN Meds:.sodium chloride, acetaminophen, hydrALAZINE, sodium chloride flush   I have personally reviewed following labs and imaging studies  LABORATORY DATA: CBC: Recent Labs  Lab  08/01/23 1604 08/02/23 0315  WBC 9.4 9.8  HGB 14.5 14.0  HCT 42.5 41.9  MCV 82.5 80.7  PLT 282 275    Basic Metabolic Panel: Recent Labs  Lab 08/01/23 1604 08/01/23 2055 08/02/23 0315  NA 121* 123* 124*  K 4.5 4.3 4.2  CL 90* 85* 93*  CO2 23 22 22   GLUCOSE 115* 113* 112*  BUN 18 18 15   CREATININE 1.29* 1.26* 1.13*  CALCIUM 8.8* 9.5 9.0    GFR: Estimated Creatinine Clearance: 57 mL/min (A) (by C-G formula based on SCr of 1.13 mg/dL (H)).  Liver Function Tests: Recent Labs  Lab 08/01/23 1604  AST 21  ALT 17  ALKPHOS 55  BILITOT 0.9  PROT 7.0  ALBUMIN 3.9   No results for input(s): "LIPASE", "AMYLASE" in the last 168 hours. No results for input(s): "AMMONIA" in the last 168 hours.  Coagulation Profile: No results for input(s): "INR", "PROTIME" in the last 168 hours.  Cardiac Enzymes: No results for input(s): "CKTOTAL", "CKMB", "CKMBINDEX", "TROPONINI" in the last 168 hours.  BNP (last 3 results) No results for input(s): "PROBNP" in the last 8760 hours.  Lipid Profile: No results for input(s): "CHOL", "HDL", "LDLCALC", "TRIG", "CHOLHDL", "LDLDIRECT" in the last 72 hours.  Thyroid Function Tests: Recent Labs    08/01/23 2055  TSH 1.096    Anemia Panel: Recent Labs    08/01/23 2055  VITAMINB12 3,178*  FOLATE 15.1    Urine analysis:    Component Value Date/Time   COLORURINE STRAW (A) 08/01/2023 2216   APPEARANCEUR CLEAR 08/01/2023 2216   LABSPEC 1.005 08/01/2023 2216   PHURINE 7.0 08/01/2023 2216   GLUCOSEU NEGATIVE 08/01/2023 2216   HGBUR NEGATIVE 08/01/2023 2216   BILIRUBINUR NEGATIVE 08/01/2023 2216   KETONESUR NEGATIVE 08/01/2023 2216   PROTEINUR NEGATIVE 08/01/2023 2216   UROBILINOGEN 0.2 01/31/2011 1208   NITRITE NEGATIVE 08/01/2023 2216   LEUKOCYTESUR NEGATIVE 08/01/2023 2216    Sepsis Labs: Lactic Acid, Venous    Component Value Date/Time   LATICACIDVEN 1.0 01/22/2021 1605    MICROBIOLOGY: No results found for this or any  previous visit (from the past 240 hour(s)).  RADIOLOGY STUDIES/RESULTS: No results found.   LOS: 1 day   Jeoffrey Massed, MD  [facility]    To contact the attending provider between 7A-7P or the covering provider during after hours 7P-7A, please log into the web site [email or web address] and access using universal [place] password for that web site. If you do not have the password, please call the hospital operator.  08/02/2023, 9:49 AM

## 2023-08-02 NOTE — Plan of Care (Signed)

## 2023-08-03 DIAGNOSIS — E871 Hypo-osmolality and hyponatremia: Secondary | ICD-10-CM | POA: Diagnosis not present

## 2023-08-03 DIAGNOSIS — I5032 Chronic diastolic (congestive) heart failure: Secondary | ICD-10-CM | POA: Diagnosis not present

## 2023-08-03 DIAGNOSIS — N1832 Chronic kidney disease, stage 3b: Secondary | ICD-10-CM | POA: Diagnosis not present

## 2023-08-03 DIAGNOSIS — K219 Gastro-esophageal reflux disease without esophagitis: Secondary | ICD-10-CM | POA: Diagnosis not present

## 2023-08-03 LAB — BASIC METABOLIC PANEL
Anion gap: 18 — ABNORMAL HIGH (ref 5–15)
BUN: 18 mg/dL (ref 8–23)
CO2: 14 mmol/L — ABNORMAL LOW (ref 22–32)
Calcium: 9.2 mg/dL (ref 8.9–10.3)
Chloride: 93 mmol/L — ABNORMAL LOW (ref 98–111)
Creatinine, Ser: 1.05 mg/dL — ABNORMAL HIGH (ref 0.44–1.00)
GFR, Estimated: 54 mL/min — ABNORMAL LOW (ref >60)
Glucose, Bld: 94 mg/dL (ref 70–99)
Potassium: 4.3 mmol/L (ref 3.5–5.1)
Sodium: 125 mmol/L — ABNORMAL LOW (ref 135–145)

## 2023-08-03 LAB — URINALYSIS, ROUTINE W REFLEX MICROSCOPIC
Bacteria, UA: NONE SEEN
Bilirubin Urine: NEGATIVE
Glucose, UA: NEGATIVE mg/dL
Hgb urine dipstick: NEGATIVE
Ketones, ur: NEGATIVE mg/dL
Nitrite: NEGATIVE
Protein, ur: 30 mg/dL — AB
Specific Gravity, Urine: 1.02 (ref 1.005–1.030)
pH: 6 (ref 5.0–8.0)

## 2023-08-03 LAB — CBC
HCT: 45.2 % (ref 36.0–46.0)
Hemoglobin: 15.5 g/dL — ABNORMAL HIGH (ref 12.0–15.0)
MCH: 27.6 pg (ref 26.0–34.0)
MCHC: 34.3 g/dL (ref 30.0–36.0)
MCV: 80.6 fL (ref 80.0–100.0)
Platelets: 240 10*3/uL (ref 150–400)
RBC: 5.61 MIL/uL — ABNORMAL HIGH (ref 3.87–5.11)
RDW: 13.9 % (ref 11.5–15.5)
WBC: 12.9 10*3/uL — ABNORMAL HIGH (ref 4.0–10.5)
nRBC: 0 % (ref 0.0–0.2)

## 2023-08-03 MED ORDER — METOPROLOL TARTRATE 50 MG PO TABS
75.0000 mg | ORAL_TABLET | Freq: Two times a day (BID) | ORAL | Status: DC
  Administered 2023-08-03 – 2023-08-06 (×6): 75 mg via ORAL
  Filled 2023-08-03 (×7): qty 1

## 2023-08-03 MED ORDER — ORAL CARE MOUTH RINSE: 15.0000 mL | OROMUCOSAL | Status: DC | PRN

## 2023-08-03 NOTE — Progress Notes (Signed)
PROGRESS NOTE        PATIENT DETAILS Name: [name] Age: 77 y.o. Sex: female Date of Birth: 02/27/1946 Admit Date: 08/01/2023 Admitting Physician Tereasa Coop, MD WUJ:[id number], [name], MD  Brief Summary: Patient is a 77 y.o.  female history of PAF on Eliquis, chronic HFpEF, GERD, hypothyroidism-who apparently started developing lower extremity edema (which she attributes to a medication) recently-following which she was started on Lasix/Aldactone-and was also attempting to keep herself hydrated by drinking fluids/water-presented to the ED on 8/30 after she was found to have hyponatremia.  Significant events: 8/30>> admit to [facility]  Significant studies: None  Significant microbiology data: None  Procedures: None  Consults: None  Subjective: Could not sleep last night-very sleepy this morning but awake/alert.  No nausea or vomiting.  Spouse at bedside.  Spouse acknowledges that she drinks a lot of water at home-at least 3-4 (16 ounces) bottles on a daily basis.  Objective: Vitals: Blood pressure (!) 131/104, pulse 85, temperature 97.8 F (36.6 C), resp. rate 20, height 5\' 6"  (1.676 m), weight 93.7 kg, SpO2 96%.   Exam: Sleepy-but awake/alert Chest: Clear to auscultation CVS: S1-S2 regular Extremities: No edema  Pertinent Labs/Radiology:    Latest Ref Rng & Units 08/03/2023    3:31 AM 08/02/2023    3:15 AM 08/01/2023    4:04 PM  CBC  WBC 4.0 - 10.5 K/uL 12.9  9.8  9.4   Hemoglobin 12.0 - 15.0 g/dL 47.8  29.5  62.1   Hematocrit 36.0 - 46.0 % 45.2  41.9  42.5   Platelets 150 - 400 K/uL 240  275  282     Lab Results  Component Value Date   NA 125 (L) 08/03/2023   K 4.3 08/03/2023   CL 93 (L) 08/03/2023   CO2 14 (L) 08/03/2023      Assessment/Plan: Hyponatremia Suspect due to combination excessive free water intake and recent diuretic use Euvolemic on exam Sodium essentially unchanged overnight Continue fluid restriction  Recent  worsening lower extremity edema I suspect this is from her being on amlodipine-she apparently was started on diuretics for this reason Hardly any lower extremity edema today. Hold amlodipine on discharge.  Chronic HFpEF Euvolemic Holding diuretics  Chronic atrial fibrillation Rate controlled Telemetry monitoring Metoprolol/Eliquis  HTN BP slightly on the higher side Holding amlodipine Increase metoprolol to 75 mg twice daily.  HLD Statin  Hypothyroidism Levothyroxine TSH on 8/30 stable  GERD PPI  Morbid Obesity: Estimated body mass index is 33.34 kg/m as calculated from the following:   Height as of this encounter: 5\' 6"  (1.676 m).   Weight as of this encounter: 93.7 kg.   Code status:   Code Status: Full Code   DVT Prophylaxis: SCDs Start: 08/01/23 2004 apixaban (ELIQUIS) tablet 5 mg    Family Communication: Spouse at bedside   Disposition Plan: Status is: Inpatient Remains inpatient appropriate because: Severity of illness   Planned Discharge Destination:Home   Diet: Diet Order             Diet Heart Room service appropriate? Yes; Fluid consistency: Thin; Fluid restriction: 1200 mL Fluid  Diet effective now                     Antimicrobial agents: Anti-infectives (From admission, onward)    None  MEDICATIONS: Scheduled Meds:  apixaban  5 mg Oral BID   levothyroxine  125 mcg Oral QAC breakfast   metoprolol tartrate  50 mg Oral BID   pantoprazole  40 mg Oral Daily   pravastatin  80 mg Oral q AM   sodium chloride flush  3 mL Intravenous Q12H   Continuous Infusions:  sodium chloride     PRN Meds:.sodium chloride, acetaminophen, hydrALAZINE, mouth rinse, sodium chloride flush   I have personally reviewed following labs and imaging studies  LABORATORY DATA: CBC: Recent Labs  Lab 08/01/23 1604 08/02/23 0315 08/03/23 0331  WBC 9.4 9.8 12.9*  HGB 14.5 14.0 15.5*  HCT 42.5 41.9 45.2  MCV 82.5 80.7 80.6  PLT 282 275  240    Basic Metabolic Panel: Recent Labs  Lab 08/01/23 2055 08/02/23 0315 08/02/23 0905 08/02/23 1731 08/03/23 0331  NA 123* 124* 124* 126* 125*  K 4.3 4.2 4.3 4.8 4.3  CL 85* 93* 91* 93* 93*  CO2 22 22 21* 24 14*  GLUCOSE 113* 112* 109* 103* 94  BUN 18 15 13 19 18   CREATININE 1.26* 1.13* 1.13* 1.27* 1.05*  CALCIUM 9.5 9.0 9.2 9.3 9.2    GFR: Estimated Creatinine Clearance: 51 mL/min (A) (by C-G formula based on SCr of 1.05 mg/dL (H)).  Liver Function Tests: Recent Labs  Lab 08/01/23 1604  AST 21  ALT 17  ALKPHOS 55  BILITOT 0.9  PROT 7.0  ALBUMIN 3.9   No results for input(s): "LIPASE", "AMYLASE" in the last 168 hours. No results for input(s): "AMMONIA" in the last 168 hours.  Coagulation Profile: No results for input(s): "INR", "PROTIME" in the last 168 hours.  Cardiac Enzymes: No results for input(s): "CKTOTAL", "CKMB", "CKMBINDEX", "TROPONINI" in the last 168 hours.  BNP (last 3 results) No results for input(s): "PROBNP" in the last 8760 hours.  Lipid Profile: No results for input(s): "CHOL", "HDL", "LDLCALC", "TRIG", "CHOLHDL", "LDLDIRECT" in the last 72 hours.  Thyroid Function Tests: Recent Labs    08/01/23 2055  TSH 1.096    Anemia Panel: Recent Labs    08/01/23 2055  VITAMINB12 3,178*  FOLATE 15.1    Urine analysis:    Component Value Date/Time   COLORURINE STRAW (A) 08/01/2023 2216   APPEARANCEUR CLEAR 08/01/2023 2216   LABSPEC 1.005 08/01/2023 2216   PHURINE 7.0 08/01/2023 2216   GLUCOSEU NEGATIVE 08/01/2023 2216   HGBUR NEGATIVE 08/01/2023 2216   BILIRUBINUR NEGATIVE 08/01/2023 2216   KETONESUR NEGATIVE 08/01/2023 2216   PROTEINUR NEGATIVE 08/01/2023 2216   UROBILINOGEN 0.2 01/31/2011 1208   NITRITE NEGATIVE 08/01/2023 2216   LEUKOCYTESUR NEGATIVE 08/01/2023 2216    Sepsis Labs: Lactic Acid, Venous    Component Value Date/Time   LATICACIDVEN 1.0 01/22/2021 1605    MICROBIOLOGY: No results found for this or any  previous visit (from the past 240 hour(s)).  RADIOLOGY STUDIES/RESULTS: No results found.   LOS: 2 days   Jeoffrey Massed, MD  [facility]    To contact the attending provider between 7A-7P or the covering provider during after hours 7P-7A, please log into the web site [email or web address] and access using universal [place] password for that web site. If you do not have the password, please call the hospital operator.  08/03/2023, 9:43 AM

## 2023-08-04 DIAGNOSIS — I5032 Chronic diastolic (congestive) heart failure: Secondary | ICD-10-CM | POA: Diagnosis not present

## 2023-08-04 DIAGNOSIS — Z8679 Personal history of other diseases of the circulatory system: Secondary | ICD-10-CM | POA: Diagnosis not present

## 2023-08-04 DIAGNOSIS — N1832 Chronic kidney disease, stage 3b: Secondary | ICD-10-CM | POA: Diagnosis not present

## 2023-08-04 DIAGNOSIS — E871 Hypo-osmolality and hyponatremia: Secondary | ICD-10-CM | POA: Diagnosis not present

## 2023-08-04 LAB — BASIC METABOLIC PANEL
Anion gap: 9 (ref 5–15)
Anion gap: 11 (ref 5–15)
Anion gap: 9 (ref 5–15)
Anion gap: 9 (ref 5–15)
BUN: 28 mg/dL — ABNORMAL HIGH (ref 8–23)
BUN: 25 mg/dL — ABNORMAL HIGH (ref 8–23)
BUN: 25 mg/dL — ABNORMAL HIGH (ref 8–23)
BUN: 28 mg/dL — ABNORMAL HIGH (ref 8–23)
CO2: 20 mmol/L — ABNORMAL LOW (ref 22–32)
CO2: 19 mmol/L — ABNORMAL LOW (ref 22–32)
CO2: 23 mmol/L (ref 22–32)
CO2: 23 mmol/L (ref 22–32)
Calcium: 8.5 mg/dL — ABNORMAL LOW (ref 8.9–10.3)
Calcium: 8.7 mg/dL — ABNORMAL LOW (ref 8.9–10.3)
Calcium: 8.5 mg/dL — ABNORMAL LOW (ref 8.9–10.3)
Calcium: 8.7 mg/dL — ABNORMAL LOW (ref 8.9–10.3)
Chloride: 91 mmol/L — ABNORMAL LOW (ref 98–111)
Chloride: 90 mmol/L — ABNORMAL LOW (ref 98–111)
Chloride: 88 mmol/L — ABNORMAL LOW (ref 98–111)
Chloride: 90 mmol/L — ABNORMAL LOW (ref 98–111)
Creatinine, Ser: 1.48 mg/dL — ABNORMAL HIGH (ref 0.44–1.00)
Creatinine, Ser: 1.31 mg/dL — ABNORMAL HIGH (ref 0.44–1.00)
Creatinine, Ser: 1.39 mg/dL — ABNORMAL HIGH (ref 0.44–1.00)
Creatinine, Ser: 1.67 mg/dL — ABNORMAL HIGH (ref 0.44–1.00)
GFR, Estimated: 36 mL/min — ABNORMAL LOW (ref >60)
GFR, Estimated: 42 mL/min — ABNORMAL LOW (ref >60)
GFR, Estimated: 39 mL/min — ABNORMAL LOW (ref >60)
GFR, Estimated: 31 mL/min — ABNORMAL LOW (ref >60)
Glucose, Bld: 91 mg/dL (ref 70–99)
Glucose, Bld: 88 mg/dL (ref 70–99)
Glucose, Bld: 99 mg/dL (ref 70–99)
Glucose, Bld: 98 mg/dL (ref 70–99)
Potassium: 4.9 mmol/L (ref 3.5–5.1)
Potassium: 4.2 mmol/L (ref 3.5–5.1)
Potassium: 4.2 mmol/L (ref 3.5–5.1)
Potassium: 5 mmol/L (ref 3.5–5.1)
Sodium: 120 mmol/L — ABNORMAL LOW (ref 135–145)
Sodium: 120 mmol/L — ABNORMAL LOW (ref 135–145)
Sodium: 120 mmol/L — ABNORMAL LOW (ref 135–145)
Sodium: 122 mmol/L — ABNORMAL LOW (ref 135–145)

## 2023-08-04 LAB — SODIUM, URINE, RANDOM: Sodium, Ur: 34 mmol/L

## 2023-08-04 LAB — OSMOLALITY, URINE: Osmolality, Ur: 408 mosm/kg (ref 300–900)

## 2023-08-04 LAB — OSMOLALITY: Osmolality: 268 mosm/kg — ABNORMAL LOW (ref 275–295)

## 2023-08-04 MED ORDER — SODIUM CHLORIDE 0.9 % IV SOLN: INTRAVENOUS | Status: DC

## 2023-08-04 MED ORDER — LEVOTHYROXINE SODIUM 25 MCG PO TABS
125.0000 ug | ORAL_TABLET | Freq: Every day | ORAL | Status: DC
  Administered 2023-08-05 – 2023-08-06 (×2): 125 ug via ORAL
  Filled 2023-08-04 (×2): qty 1

## 2023-08-04 MED ORDER — FUROSEMIDE 20 MG PO TABS
20.0000 mg | ORAL_TABLET | Freq: Every day | ORAL | Status: DC
  Administered 2023-08-04 – 2023-08-06 (×3): 20 mg via ORAL
  Filled 2023-08-04 (×3): qty 1

## 2023-08-04 NOTE — Plan of Care (Signed)

## 2023-08-04 NOTE — Progress Notes (Signed)
PROGRESS NOTE        PATIENT DETAILS Name: [name] Age: 77 y.o. Sex: female Date of Birth: 1946-07-23 Admit Date: 08/01/2023 Admitting Physician Tereasa Coop, MD WUJ:[id number], [name], MD  Brief Summary: Patient is a 77 y.o.  female history of PAF on Eliquis, chronic HFpEF, GERD, hypothyroidism-who apparently started developing lower extremity edema (which she attributes to a medication) recently-following which she was started on Lasix/Aldactone-and was also attempting to keep herself hydrated by drinking fluids/water-presented to the ED on 8/30 after she was found to have hyponatremia.  Significant events: 8/30>> admit to [facility]  Significant studies: 8/30>> TSH: 1.096 8/30>> serum osmolality: 267 8/30>> urine osmolality: 226 9/02>> serum osmolality: 268  Significant microbiology data: None  Procedures: None  Consults: Renal  Subjective: Much more awake and alert this morning-claims did not drink a lot of water yesterday-and was able to eat most of her meals.  Objective: Vitals: Blood pressure 121/84, pulse 91, temperature 98.2 F (36.8 C), temperature source Oral, resp. rate 20, height 5\' 6"  (1.676 m), weight 96.9 kg, SpO2 98%.   Exam: Much more awake and alert compared to yesterday Chest: Clear to auscultation CVS: S1-S2 Abdomen: Soft nontender Extremities: Trace edema  Pertinent Labs/Radiology:    Latest Ref Rng & Units 08/03/2023    3:31 AM 08/02/2023    3:15 AM 08/01/2023    4:04 PM  CBC  WBC 4.0 - 10.5 K/uL 12.9  9.8  9.4   Hemoglobin 12.0 - 15.0 g/dL 47.8  29.5  62.1   Hematocrit 36.0 - 46.0 % 45.2  41.9  42.5   Platelets 150 - 400 K/uL 240  275  282     Lab Results  Component Value Date   NA 120 (L) 08/04/2023   K 4.2 08/04/2023   CL 90 (L) 08/04/2023   CO2 19 (L) 08/04/2023      Assessment/Plan: Hyponatremia Suspect due to combination excessive free water intake and recent diuretic use Had improved with just fluid  restriction-unclear why significant drop in sodium overnight (patient was sleepy yesterday-suspect may not have eaten all her meals)-she denies any significant water intake yesterday Volume status stable-does not appear dry or infected has a little bit more edema in her legs today compared to yesterday. Restart gentle IV fluid hydration-as creatinine  also elevated today Will get nephrology opinion. Follow sodium levels closely  Recent worsening lower extremity edema I suspect this is from her being on amlodipine-she apparently was started on diuretics for this reason Hardly any lower extremity edema today. Hold amlodipine on discharge.  Chronic HFpEF Euvolemic Holding diuretics  Chronic atrial fibrillation Rate controlled Telemetry monitoring Metoprolol/Eliquis  HTN BP stable on metoprolol.    HLD Statin  Hypothyroidism Levothyroxine TSH on 8/30 stable  GERD PPI  Morbid Obesity: Estimated body mass index is 34.48 kg/m as calculated from the following:   Height as of this encounter: 5\' 6"  (1.676 m).   Weight as of this encounter: 96.9 kg.   Code status:   Code Status: Full Code   DVT Prophylaxis: SCDs Start: 08/01/23 2004 apixaban (ELIQUIS) tablet 5 mg    Family Communication: Spouse at bedside   Disposition Plan: Status is: Inpatient Remains inpatient appropriate because: Severity of illness   Planned Discharge Destination:Home   Diet: Diet Order  Diet Heart Room service appropriate? Yes; Fluid consistency: Thin; Fluid restriction: 1200 mL Fluid  Diet effective now                     Antimicrobial agents: Anti-infectives (From admission, onward)    None        MEDICATIONS: Scheduled Meds:  apixaban  5 mg Oral BID   levothyroxine  125 mcg Oral QAC breakfast   metoprolol tartrate  75 mg Oral BID   pantoprazole  40 mg Oral Daily   pravastatin  80 mg Oral q AM   sodium chloride flush  3 mL Intravenous Q12H   Continuous  Infusions:  sodium chloride     sodium chloride 60 mL/hr at 08/04/23 0606   PRN Meds:.sodium chloride, acetaminophen, hydrALAZINE, mouth rinse, sodium chloride flush   I have personally reviewed following labs and imaging studies  LABORATORY DATA: CBC: Recent Labs  Lab 08/01/23 1604 08/02/23 0315 08/03/23 0331  WBC 9.4 9.8 12.9*  HGB 14.5 14.0 15.5*  HCT 42.5 41.9 45.2  MCV 82.5 80.7 80.6  PLT 282 275 240    Basic Metabolic Panel: Recent Labs  Lab 08/02/23 0905 08/02/23 1731 08/03/23 0331 08/04/23 0310 08/04/23 0556  NA 124* 126* 125* 120* 120*  K 4.3 4.8 4.3 4.9 4.2  CL 91* 93* 93* 91* 90*  CO2 21* 24 14* 20* 19*  GLUCOSE 109* 103* 94 91 88  BUN 13 19 18  28* 25*  CREATININE 1.13* 1.27* 1.05* 1.48* 1.31*  CALCIUM 9.2 9.3 9.2 8.5* 8.7*    GFR: Estimated Creatinine Clearance: 41.5 mL/min (A) (by C-G formula based on SCr of 1.31 mg/dL (H)).  Liver Function Tests: Recent Labs  Lab 08/01/23 1604  AST 21  ALT 17  ALKPHOS 55  BILITOT 0.9  PROT 7.0  ALBUMIN 3.9   No results for input(s): "LIPASE", "AMYLASE" in the last 168 hours. No results for input(s): "AMMONIA" in the last 168 hours.  Coagulation Profile: No results for input(s): "INR", "PROTIME" in the last 168 hours.  Cardiac Enzymes: No results for input(s): "CKTOTAL", "CKMB", "CKMBINDEX", "TROPONINI" in the last 168 hours.  BNP (last 3 results) No results for input(s): "PROBNP" in the last 8760 hours.  Lipid Profile: No results for input(s): "CHOL", "HDL", "LDLCALC", "TRIG", "CHOLHDL", "LDLDIRECT" in the last 72 hours.  Thyroid Function Tests: Recent Labs    08/01/23 2055  TSH 1.096    Anemia Panel: Recent Labs    08/01/23 2055  VITAMINB12 3,178*  FOLATE 15.1    Urine analysis:    Component Value Date/Time   COLORURINE YELLOW 08/03/2023 1225   APPEARANCEUR HAZY (A) 08/03/2023 1225   LABSPEC 1.020 08/03/2023 1225   PHURINE 6.0 08/03/2023 1225   GLUCOSEU NEGATIVE 08/03/2023 1225    HGBUR NEGATIVE 08/03/2023 1225   BILIRUBINUR NEGATIVE 08/03/2023 1225   KETONESUR NEGATIVE 08/03/2023 1225   PROTEINUR 30 (A) 08/03/2023 1225   UROBILINOGEN 0.2 01/31/2011 1208   NITRITE NEGATIVE 08/03/2023 1225   LEUKOCYTESUR TRACE (A) 08/03/2023 1225    Sepsis Labs: Lactic Acid, Venous    Component Value Date/Time   LATICACIDVEN 1.0 01/22/2021 1605    MICROBIOLOGY: No results found for this or any previous visit (from the past 240 hour(s)).  RADIOLOGY STUDIES/RESULTS: No results found.   LOS: 3 days   Jeoffrey Massed, MD  [facility]    To contact the attending provider between 7A-7P or the covering provider during after hours 7P-7A, please log into the web  site [email or web address] and access using universal [place] password for that web site. If you do not have the password, please call the hospital operator.  08/04/2023, 9:27 AM

## 2023-08-04 NOTE — Consult Note (Addendum)
Reason for Consult: Hyponatremia Referring Physician: Caryl Comes, MD [facility])  HPI:  77 year old woman with past medical history significant for atrial fibrillation on anticoagulation with Eliquis, chronic diastolic heart failure, history of pericardial effusion status post pericardiocentesis in February 2022, hypothyroidism, dyslipidemia and chronic kidney disease stage III (baseline creatinine 1.1-1.3).  Review of labs showed transient hyponatremia in 2022 when sodium was 130-134.  She was admitted to the hospital 2 days ago after outpatient labs showed a sodium of 121 after recently being started on furosemide and spironolactone for lower extremity edema.  She reports to have been drinking more water than usual in addition to other fluids to "prevent dehydration" after she was started on diuretics prior to admission.  Her sodium level improved from 121 on 8/30 to 126 on 8/31 and concern is raised that it has dropped back down to 120 with continued efforts at fluid restriction/solute intake.  She denies any chest pain, nausea, vomiting or shortness of breath.  She denies any orthostatic dizziness and her only complaints perseverate around swelling of her lower extremities and "a burning sensation" that has resolved.  She denies any dysuria, urgency, frequency, flank pain, fever or chills.  Serum osmolality is low at 268 with an inappropriately elevated urine osmolality of 408.  TSH at goal 1.096.  Past Medical History:  Diagnosis Date   Arthritis    Atrial fibrillation (HCC)    CHF (congestive heart failure) (HCC)    CKD (chronic kidney disease), stage III (HCC)    Colon polyps    Dysrhythmia    new onset afib    Goiter    Hyperglycemia    Hyperlipidemia    Hypertension    Hypothyroidism    Morbid obesity (HCC)    Osteopenia     Past Surgical History:  Procedure Laterality Date   CARDIOVASCULAR STRESS TEST     COLONOSCOPY     IR THORACENTESIS ASP PLEURAL SPACE W/IMG GUIDE   01/26/2021   LUMBAR DISC SURGERY  2012   PERICARDIOCENTESIS N/A 01/24/2021   Procedure: PERICARDIOCENTESIS;  Surgeon: Yates Decamp, MD;  Location: [facility] INVASIVE CV LAB;  Service: Cardiovascular;  Laterality: N/A;   POLYPECTOMY     TOTAL KNEE ARTHROPLASTY Left 05/29/2017   Procedure: LEFT TOTAL KNEE ARTHROPLASTY;  Surgeon: Tarry Kos, MD;  Location: [facility] OR;  Service: Orthopedics;  Laterality: Left;   TUBAL LIGATION  1977   UPPER GASTROINTESTINAL ENDOSCOPY  12/29/2019   US ECHOCARDIOGRAPHY      Family History  Problem Relation Age of Onset   CVA Mother    Heart disease Brother    Heart disease Father    Colon cancer Neg Hx    Stomach cancer Neg Hx    Colon polyps Neg Hx    Esophageal cancer Neg Hx    Rectal cancer Neg Hx     Social History:  reports that she has never smoked. She has never used smokeless tobacco. She reports that she does not drink alcohol and does not use drugs.  Allergies:  Allergies  Allergen Reactions   Atorvastatin Other (See Comments)    Aches     Medications: I have reviewed the patient's current medications. Scheduled:  apixaban  5 mg Oral BID   levothyroxine  125 mcg Oral QAC breakfast   metoprolol tartrate  75 mg Oral BID   pantoprazole  40 mg Oral Daily   pravastatin  80 mg Oral q AM   sodium chloride flush  3 mL Intravenous Q12H  Latest Ref Rng & Units 08/04/2023   10:28 AM 08/04/2023    5:56 AM 08/04/2023    3:10 AM  BMP  Glucose 70 - 99 mg/dL 99  88  91   BUN 8 - 23 mg/dL 25  25  28    Creatinine 0.44 - 1.00 mg/dL 2.44  0.10  2.72   Sodium 135 - 145 mmol/L 120  120  120   Potassium 3.5 - 5.1 mmol/L 4.2  4.2  4.9   Chloride 98 - 111 mmol/L 88  90  91   CO2 22 - 32 mmol/L 23  19  20    Calcium 8.9 - 10.3 mg/dL 8.5  8.7  8.5       Latest Ref Rng & Units 08/03/2023    3:31 AM 08/02/2023    3:15 AM 08/01/2023    4:04 PM  CBC  WBC 4.0 - 10.5 K/uL 12.9  9.8  9.4   Hemoglobin 12.0 - 15.0 g/dL 53.6  64.4  03.4   Hematocrit 36.0 - 46.0 % 45.2   41.9  42.5   Platelets 150 - 400 K/uL 240  275  282    Urinalysis    Component Value Date/Time   COLORURINE YELLOW 08/03/2023 1225   APPEARANCEUR HAZY (A) 08/03/2023 1225   LABSPEC 1.020 08/03/2023 1225   PHURINE 6.0 08/03/2023 1225   GLUCOSEU NEGATIVE 08/03/2023 1225   HGBUR NEGATIVE 08/03/2023 1225   BILIRUBINUR NEGATIVE 08/03/2023 1225   KETONESUR NEGATIVE 08/03/2023 1225   PROTEINUR 30 (A) 08/03/2023 1225   UROBILINOGEN 0.2 01/31/2011 1208   NITRITE NEGATIVE 08/03/2023 1225   LEUKOCYTESUR TRACE (A) 08/03/2023 1225    Review of Systems  Constitutional:  Positive for fatigue. Negative for appetite change, chills and fever.  HENT:  Negative for postnasal drip, sinus pressure and trouble swallowing.   Eyes:  Negative for photophobia and visual disturbance.  Respiratory:  Negative for cough, chest tightness and shortness of breath.   Cardiovascular:  Positive for leg swelling. Negative for chest pain.  Gastrointestinal:  Negative for diarrhea, nausea and vomiting.  Endocrine: Negative for polydipsia and polyuria.  Genitourinary:  Negative for dysuria, frequency and urgency.  Skin:  Negative for pallor and wound.  Neurological:  Negative for dizziness, light-headedness and headaches.   Blood pressure 121/84, pulse 91, temperature 98.2 F (36.8 C), temperature source Oral, resp. rate 20, height 5\' 6"  (1.676 m), weight 96.9 kg, SpO2 98%. Physical Exam Vitals and nursing note reviewed.  Constitutional:      General: She is not in acute distress.    Appearance: Normal appearance. She is obese. She is not ill-appearing.  HENT:     Head: Normocephalic and atraumatic.     Right Ear: External ear normal.     Left Ear: External ear normal.     Nose: Nose normal.     Mouth/Throat:     Mouth: Mucous membranes are moist.     Pharynx: Oropharynx is clear.  Eyes:     General: No scleral icterus.    Extraocular Movements: Extraocular movements intact.     Conjunctiva/sclera:  Conjunctivae normal.  Cardiovascular:     Rate and Rhythm: Normal rate. Rhythm irregular.     Pulses: Normal pulses.     Heart sounds: Normal heart sounds.  Pulmonary:     Effort: Pulmonary effort is normal.     Breath sounds: Normal breath sounds. No wheezing or rales.  Abdominal:     General: Bowel sounds are normal.  Palpations: Abdomen is soft.  Musculoskeletal:     Cervical back: Normal range of motion and neck supple.     Right lower leg: Edema present.     Left lower leg: Edema present.     Comments: 1+ bilateral lower extremity edema  Skin:    General: Skin is warm and dry.     Findings: No bruising.  Neurological:     General: No focal deficit present.     Mental Status: She is alert and oriented to person, place, and time.  Psychiatric:        Mood and Affect: Mood normal.     Assessment/Plan: 1.  Hyponatremia: This appears to be true hypoosmolar hyponatremia in a hypervolemic patient with inappropriately elevated urine osmolality reflecting inappropriate ADH activated state.  Her hypothyroidism is well-controlled based on TSH level.  The plan at this time will be to discontinue isotonic fluids and begin low-dose furosemide 20 mg daily along with oral fluid restriction (<1.2 liters/day) while encouraging oral food/solute intake.  She is asymptomatic from her hyponatremia and there are no current indications for hypertonic saline. 2.  Hypertension: Blood pressure appears to be under decent control, monitor off of amlodipine and after starting furosemide.  Discussed sodium restriction. 3.  History of congestive heart failure with preserved ejection fraction: With bilateral lower extremity edema that has worsened through the course of half a day so far with feet/legs in dependent position after waking up.  Will discontinue saline and begin low-dose furosemide.  Agree with holding amlodipine. 4.  Chronic atrial fibrillation: Rate controlled and on anticoagulation with  Eliquis.  Dagoberto Ligas 08/04/2023, 12:32 PM

## 2023-08-04 NOTE — TOC Initial Note (Signed)
Transition of Care [facility]) - Initial/Assessment Note    Patient Details  Name: [name] MRN: [id number] Date of Birth: 04-04-1946  [facility] CM/SW Contact:    Elliot Cousin, RN Phone Number: [phone] 08/04/2023, 10:26 AM  Clinical Narrative: Spoke to pt and independent PTA, lives with husband. States she has [employer]. She had HH PT in the past and would be agreeable if recommended by PT/OT. Will continue to follow for dc needs.                   Expected Discharge Plan: Home/Self Care Barriers to Discharge: Continued Medical Work up   Patient Goals and CMS Choice Patient states their goals for this hospitalization and ongoing recovery are:: to fully recover          Expected Discharge Plan and Services   Discharge Planning Services: CM Consult   Living arrangements for the past 2 months: Single Family Home                                      Prior Living Arrangements/Services Living arrangements for the past 2 months: Single Family Home Lives with:: Spouse Patient language and need for interpreter reviewed:: Yes Do you feel safe going back to the place where you live?: Yes      Need for Family Participation in Patient Care: No (Comment) Care giver support system in place?: Yes (comment) Current home services: DME (Roll[employer], bedside commode) Criminal Activity/Legal Involvement Pertinent to Current Situation/Hospitalization: No - Comment as needed  Activities of Daily Living Home Assistive Devices/Equipment: Walker (specify type) ADL Screening (condition at time of admission) Patient's cognitive ability adequate to safely complete daily activities?: Yes Is the patient deaf or have difficulty hearing?: No Does the patient have difficulty seeing, even when wearing glasses/contacts?: No Does the patient have difficulty concentrating, remembering, or making decisions?: No Patient able to express need for assistance with ADLs?: Yes Does  the patient have difficulty dressing or bathing?: No Independently performs ADLs?: Yes (appropriate for developmental age) Does the patient have difficulty walking or climbing stairs?: Yes Weakness of Legs: Both Weakness of Arms/Hands: None  Permission Sought/Granted Permission sought to share information with : Case Manager, Family Supports, PCP Permission granted to share information with : Yes, Verbal Permission Granted  Share Information with NAME: [name]  Permission granted to share info w AGENCY: Home Health  Permission granted to share info w Relationship: husband  Permission granted to share info w Contact Information: [phone]  Emotional Assessment Appearance:: Appears stated age Attitude/Demeanor/Rapport: Engaged Affect (typically observed): Accepting Orientation: : Oriented to Self, Oriented to Place, Oriented to  Time, Oriented to Situation   Psych Involvement: No (comment)  Admission diagnosis:  Hyponatremia [E87.1] Patient Active Problem List   Diagnosis Date Noted   Hyponatremia 08/01/2023   History of CHF (congestive heart failure) 08/01/2023   Permanent atrial fibrillation (HCC) 08/01/2023   Hypothyroidism 08/01/2023   GERD (gastroesophageal reflux disease) 08/01/2023   Chronic heart failure with preserved ejection fraction (HCC) 05/10/2021   Nausea 02/01/2021   Pleural effusion    Pericardial effusion    CHF exacerbation (HCC) 01/22/2021   Left lower lobe pneumonia 01/22/2021   Acute respiratory failure with hypoxia (HCC) 01/22/2021   Anemia 01/22/2021   AKI (acute kidney injury) (HCC) 01/22/2021   CKD stage 3b, GFR 30-44 ml/min (HCC) 01/22/2021  Prolonged QT interval 01/22/2021   Chronic diastolic CHF (congestive heart failure) (HCC) 12/25/2020   Leg edema 12/25/2020   Essential hypertension 12/24/2019   Longstanding persistent atrial fibrillation (HCC) 12/24/2019   Preop examination 12/24/2019   Total knee replacement status 05/29/2017    PCP:  Creola Corn, MD Pharmacy:   [facility] [phone] - [place], [place] - [name] [place] [address] Phone: [phone] Fax: [phone]  [id number] - [address] [place] - [address] [place] [address] Phone: [phone] Fax: [phone]     Social Determinants of Health (SDOH) Social History: SDOH Screenings   Food Insecurity: No Food Insecurity (08/01/2023)  Housing: Patient Declined (08/01/2023)  Transportation Needs: No Transportation Needs (08/01/2023)  Utilities: Not At Risk (08/01/2023)  Tobacco Use: Low Risk  (08/01/2023)   SDOH Interventions:     Readmission Risk Interventions     No data to display

## 2023-08-05 DIAGNOSIS — N1832 Chronic kidney disease, stage 3b: Secondary | ICD-10-CM | POA: Diagnosis not present

## 2023-08-05 DIAGNOSIS — K219 Gastro-esophageal reflux disease without esophagitis: Secondary | ICD-10-CM | POA: Diagnosis not present

## 2023-08-05 DIAGNOSIS — E871 Hypo-osmolality and hyponatremia: Secondary | ICD-10-CM | POA: Diagnosis not present

## 2023-08-05 DIAGNOSIS — I5032 Chronic diastolic (congestive) heart failure: Secondary | ICD-10-CM | POA: Diagnosis not present

## 2023-08-05 LAB — BASIC METABOLIC PANEL
Anion gap: 11 (ref 5–15)
BUN: 25 mg/dL — ABNORMAL HIGH (ref 8–23)
CO2: 20 mmol/L — ABNORMAL LOW (ref 22–32)
Calcium: 9.1 mg/dL (ref 8.9–10.3)
Chloride: 93 mmol/L — ABNORMAL LOW (ref 98–111)
Creatinine, Ser: 1.33 mg/dL — ABNORMAL HIGH (ref 0.44–1.00)
GFR, Estimated: 41 mL/min — ABNORMAL LOW (ref >60)
Glucose, Bld: 90 mg/dL (ref 70–99)
Potassium: 4.5 mmol/L (ref 3.5–5.1)
Sodium: 124 mmol/L — ABNORMAL LOW (ref 135–145)

## 2023-08-05 NOTE — Plan of Care (Signed)

## 2023-08-05 NOTE — Progress Notes (Signed)
Patient ID: [name], female   DOB: 1946-04-10, 77 y.o.   MRN: [id number] [facility] Progress Note   Assessment/ Plan:   1.  Hyponatremia: True hypoosmolar hyponatremia in a hypervolemic patient with inappropriately elevated urine osmolality reflecting inappropriate ADH activated state.  Improving on loop diuretic after discontinuation of saline with plans for possible discharge within the next 24 hours if sodium continues to trend up.  No indication for hypertonic saline. 2.  Hypertension: Blood pressure appears to be under decent control, monitor off of amlodipine and after starting furosemide.  Discussed sodium restriction. 3.  History of congestive heart failure with preserved ejection fraction: Avoid amlodipine given its association with pedal edema.  Improving with loop diuretic. 4.  Chronic atrial fibrillation: Rate controlled and on anticoagulation with Eliquis.  Subjective:   Denies any acute events overnight, feels that leg swelling is better.   Objective:   BP 120/75   Pulse 85   Temp (!) 97.1 F (36.2 C)   Resp 15   Ht 5\' 6"  (1.676 m)   Wt 96.9 kg   SpO2 96%   BMI 34.48 kg/m   Intake/Output Summary (Last 24 hours) at 08/05/2023 1103 Last data filed at 08/04/2023 1630 Gross per 24 hour  Intake 480 ml  Output --  Net 480 ml   Weight change:   Physical Exam: Gen: Sitting in recliner with feet propped up CVS: Pulse regular rhythm, normal rate, S1 and S2 normal Resp: Clear to auscultation bilaterally, no rales/rhonchi Abd: Soft, obese, nontender, bowel sounds normal Ext: Trace bilateral lower extremity edema  Imaging: No results found.  Labs: BMET Recent Labs  Lab 08/02/23 1731 08/03/23 0331 08/04/23 0310 08/04/23 0556 08/04/23 1028 08/04/23 1830 08/05/23 0653  NA 126* 125* 120* 120* 120* 122* 124*  K 4.8 4.3 4.9 4.2 4.2 5.0 4.5  CL 93* 93* 91* 90* 88* 90* 93*  CO2 24 14* 20* 19* 23 23 20*  GLUCOSE 103* 94 91 88 99 98 90  BUN 19 18 28*  25* 25* 28* 25*  CREATININE 1.27* 1.05* 1.48* 1.31* 1.39* 1.67* 1.33*  CALCIUM 9.3 9.2 8.5* 8.7* 8.5* 8.7* 9.1   CBC Recent Labs  Lab 08/01/23 1604 08/02/23 0315 08/03/23 0331  WBC 9.4 9.8 12.9*  HGB 14.5 14.0 15.5*  HCT 42.5 41.9 45.2  MCV 82.5 80.7 80.6  PLT 282 275 240    Medications:     apixaban  5 mg Oral BID   furosemide  20 mg Oral Daily   levothyroxine  125 mcg Oral QAC breakfast   metoprolol tartrate  75 mg Oral BID   pantoprazole  40 mg Oral Daily   pravastatin  80 mg Oral q AM   sodium chloride flush  3 mL Intravenous Q12H   Zetta Bills, MD 08/05/2023, 11:03 AM

## 2023-08-05 NOTE — Care Management Important Message (Signed)
Important Message  Patient Details  Name: LAMIKA DUNFEE MRN: [id number] Date of Birth: 1946/08/17   Medicare Important Message Given:  Yes     Sherilyn Banker 08/05/2023, 12:35 PM

## 2023-08-05 NOTE — Discharge Instructions (Signed)

## 2023-08-05 NOTE — Progress Notes (Addendum)
PROGRESS NOTE        PATIENT DETAILS Name: [name] Age: 77 y.o. Sex: female Date of Birth: January 08, 1946 Admit Date: 08/01/2023 Admitting Physician Patricia Coop, MD ZOX:[id number], Patricia Ruiz, MD  Brief Summary: Patient is a 77 y.o.  female history of PAF on Eliquis, chronic HFpEF, GERD, hypothyroidism-who apparently started developing lower extremity edema (which she attributes to a medication) recently-following which she was started on Lasix/Aldactone-and was also attempting to keep herself hydrated by drinking fluids/water-presented to the ED on 8/30 after she was found to have hyponatremia.  Significant events: 8/30>> admit to [facility]  Significant studies: 8/30>> TSH: 1.096 8/30>> serum osmolality: 267 8/30>> urine osmolality: 226 9/02>> serum osmolality: 268  Significant microbiology data: None  Procedures: None  Consults: Renal  Subjective: No major complaints-awake/alert.  Objective: Vitals: Blood pressure 120/75, pulse 85, temperature (!) 97.1 F (36.2 C), resp. rate 15, height 5\' 6"  (1.676 m), weight 96.9 kg, SpO2 96%.   Exam: Awake/alert Clear to auscultation S1-S2 regular Soft nontender 1+ pitting edema   Pertinent Labs/Radiology:    Latest Ref Rng & Units 08/03/2023    3:31 AM 08/02/2023    3:15 AM 08/01/2023    4:04 PM  CBC  WBC 4.0 - 10.5 K/uL 12.9  9.8  9.4   Hemoglobin 12.0 - 15.0 g/dL 54.0  98.1  19.1   Hematocrit 36.0 - 46.0 % 45.2  41.9  42.5   Platelets 150 - 400 K/uL 240  275  282     Lab Results  Component Value Date   NA 124 (L) 08/05/2023   K 4.5 08/05/2023   CL 93 (L) 08/05/2023   CO2 20 (L) 08/05/2023      Assessment/Plan: Hyponatremia Suspect due to combination excessive free water intake and recent diuretic use-on a background of mild HFpEF decompensation Initially responded to IVF-however sodium levels started dropping again-evaluated by nephrology and now started on Lasix with gradual improvement in  sodium levels. Continue Lasix Repeat chemistries tomorrow.  Recent worsening lower extremity edema I suspect this is from her being on amlodipine-she apparently was started on diuretics for this reason Hold amlodipine on discharge.  Acute on chronic HFpEF See above  CKD stage IIIb Close to baseline  Chronic atrial fibrillation Rate controlled Telemetry monitoring Metoprolol/Eliquis  HTN BP stable on metoprolol.    HLD Statin  Hypothyroidism Levothyroxine TSH on 8/30 stable  GERD PPI  Morbid Obesity: Estimated body mass index is 34.48 kg/m as calculated from the following:   Height as of this encounter: 5\' 6"  (1.676 m).   Weight as of this encounter: 96.9 kg.   Code status:   Code Status: Full Code   DVT Prophylaxis: SCDs Start: 08/01/23 2004 apixaban (ELIQUIS) tablet 5 mg    Family Communication: Spouse at bedside   Disposition Plan: Status is: Inpatient Remains inpatient appropriate because: Severity of illness   Planned Discharge Destination:Home   Diet: Diet Order             Diet Heart Room service appropriate? Yes; Fluid consistency: Thin; Fluid restriction: 1200 mL Fluid  Diet effective now                     Antimicrobial agents: Anti-infectives (From admission, onward)    None        MEDICATIONS: Scheduled Meds:  apixaban  5  mg Oral BID   furosemide  20 mg Oral Daily   levothyroxine  125 mcg Oral QAC breakfast   metoprolol tartrate  75 mg Oral BID   pantoprazole  40 mg Oral Daily   pravastatin  80 mg Oral q AM   sodium chloride flush  3 mL Intravenous Q12H   Continuous Infusions:  sodium chloride     PRN Meds:.sodium chloride, acetaminophen, hydrALAZINE, mouth rinse, sodium chloride flush   I have personally reviewed following labs and imaging studies  LABORATORY DATA: CBC: Recent Labs  Lab 08/01/23 1604 08/02/23 0315 08/03/23 0331  WBC 9.4 9.8 12.9*  HGB 14.5 14.0 15.5*  HCT 42.5 41.9 45.2  MCV 82.5  80.7 80.6  PLT 282 275 240    Basic Metabolic Panel: Recent Labs  Lab 08/04/23 0310 08/04/23 0556 08/04/23 1028 08/04/23 1830 08/05/23 0653  NA 120* 120* 120* 122* 124*  K 4.9 4.2 4.2 5.0 4.5  CL 91* 90* 88* 90* 93*  CO2 20* 19* 23 23 20*  GLUCOSE 91 88 99 98 90  BUN 28* 25* 25* 28* 25*  CREATININE 1.48* 1.31* 1.39* 1.67* 1.33*  CALCIUM 8.5* 8.7* 8.5* 8.7* 9.1    GFR: Estimated Creatinine Clearance: 40.9 mL/min (A) (by C-G formula based on SCr of 1.33 mg/dL (H)).  Liver Function Tests: Recent Labs  Lab 08/01/23 1604  AST 21  ALT 17  ALKPHOS 55  BILITOT 0.9  PROT 7.0  ALBUMIN 3.9   No results for input(s): "LIPASE", "AMYLASE" in the last 168 hours. No results for input(s): "AMMONIA" in the last 168 hours.  Coagulation Profile: No results for input(s): "INR", "PROTIME" in the last 168 hours.  Cardiac Enzymes: No results for input(s): "CKTOTAL", "CKMB", "CKMBINDEX", "TROPONINI" in the last 168 hours.  BNP (last 3 results) No results for input(s): "PROBNP" in the last 8760 hours.  Lipid Profile: No results for input(s): "CHOL", "HDL", "LDLCALC", "TRIG", "CHOLHDL", "LDLDIRECT" in the last 72 hours.  Thyroid Function Tests: No results for input(s): "TSH", "T4TOTAL", "FREET4", "T3FREE", "THYROIDAB" in the last 72 hours.   Anemia Panel: No results for input(s): "VITAMINB12", "FOLATE", "FERRITIN", "TIBC", "IRON", "RETICCTPCT" in the last 72 hours.   Urine analysis:    Component Value Date/Time   COLORURINE YELLOW 08/03/2023 1225   APPEARANCEUR HAZY (A) 08/03/2023 1225   LABSPEC 1.020 08/03/2023 1225   PHURINE 6.0 08/03/2023 1225   GLUCOSEU NEGATIVE 08/03/2023 1225   HGBUR NEGATIVE 08/03/2023 1225   BILIRUBINUR NEGATIVE 08/03/2023 1225   KETONESUR NEGATIVE 08/03/2023 1225   PROTEINUR 30 (A) 08/03/2023 1225   UROBILINOGEN 0.2 01/31/2011 1208   NITRITE NEGATIVE 08/03/2023 1225   LEUKOCYTESUR TRACE (A) 08/03/2023 1225    Sepsis Labs: Lactic Acid,  Venous    Component Value Date/Time   LATICACIDVEN 1.0 01/22/2021 1605    MICROBIOLOGY: No results found for this or any previous visit (from the past 240 hour(s)).  RADIOLOGY STUDIES/RESULTS: No results found.   LOS: 4 days   Patricia Massed, MD  [facility]    To contact the attending provider between 7A-7P or the covering provider during after hours 7P-7A, please log into the web site [email or web address] and access using universal [place] password for that web site. If you do not have the password, please call the hospital operator.  08/05/2023, 10:29 AM

## 2023-08-06 DIAGNOSIS — K219 Gastro-esophageal reflux disease without esophagitis: Secondary | ICD-10-CM | POA: Diagnosis not present

## 2023-08-06 DIAGNOSIS — I5032 Chronic diastolic (congestive) heart failure: Secondary | ICD-10-CM | POA: Diagnosis not present

## 2023-08-06 DIAGNOSIS — E871 Hypo-osmolality and hyponatremia: Secondary | ICD-10-CM | POA: Diagnosis not present

## 2023-08-06 DIAGNOSIS — N1832 Chronic kidney disease, stage 3b: Secondary | ICD-10-CM | POA: Diagnosis not present

## 2023-08-06 LAB — BASIC METABOLIC PANEL
Anion gap: 12 (ref 5–15)
BUN: 28 mg/dL — ABNORMAL HIGH (ref 8–23)
CO2: 23 mmol/L (ref 22–32)
Calcium: 9.1 mg/dL (ref 8.9–10.3)
Chloride: 92 mmol/L — ABNORMAL LOW (ref 98–111)
Creatinine, Ser: 1.19 mg/dL — ABNORMAL HIGH (ref 0.44–1.00)
GFR, Estimated: 47 mL/min — ABNORMAL LOW (ref >60)
Glucose, Bld: 96 mg/dL (ref 70–99)
Potassium: 3.9 mmol/L (ref 3.5–5.1)
Sodium: 127 mmol/L — ABNORMAL LOW (ref 135–145)

## 2023-08-06 MED ORDER — FUROSEMIDE 20 MG PO TABS: 20.0000 mg | ORAL_TABLET | Freq: Every day | ORAL | 1 refills | Status: DC

## 2023-08-06 MED ORDER — METOPROLOL TARTRATE 50 MG PO TABS: 75.0000 mg | ORAL_TABLET | Freq: Two times a day (BID) | ORAL | 1 refills | Status: DC

## 2023-08-06 NOTE — Plan of Care (Signed)

## 2023-08-06 NOTE — Discharge Summary (Signed)
PATIENT DETAILS Name: [name] Age: 77 y.o. Sex: female Date of Birth: 1946/04/30 MRN: [id number]. Admitting Physician: Tereasa Coop, MD [id number], [name], MD  Admit Date: 08/01/2023 Discharge date: 08/06/2023  Recommendations for Outpatient Follow-up:  Follow up with PCP in 1-2 weeks Please obtain CMP/CBC in one week Avoid amlodipine in the future Emphasize fluid restriction  Admitted From:  Home  Disposition: Home   Discharge Condition: good  CODE STATUS:   Code Status: Full Code   Diet recommendation:  Diet Order             Diet - low sodium heart healthy           Diet Heart Room service appropriate? Yes; Fluid consistency: Thin; Fluid restriction: 1200 mL Fluid  Diet effective now                    Brief Summary: Patient is a 77 y.o.  female history of PAF on Eliquis, chronic HFpEF, GERD, hypothyroidism-who apparently started developing lower extremity edema (which she attributes to a medication) recently-following which she was started on Lasix/Aldactone-and was also attempting to keep herself hydrated by drinking fluids/water-presented to the ED on 8/30 after she was found to have hyponatremia.   Significant events: 8/30>> admit to [facility]   Significant studies: 8/30>> TSH: 1.096 8/30>> serum osmolality: 267 8/30>> urine osmolality: 226 9/02>> serum osmolality: 268   Significant microbiology data: None   Procedures: None   Consults: Renal  Brief Hospital Course: Hyponatremia Suspect due to combination excessive free water intake and recent diuretic use-on a background of mild HFpEF decompensation Initially responded to IVF-however sodium levels dropped-hence started on Lasix/fluid restriction-with gradual improvement in sodium levels-up to 147 at the time of discharge Patient has been counseled regarding importance of fluid restriction-she will continue Lasix-and follow-up with her primary care practitioner in 1 week for repeat chemistry  panel.     Recent worsening lower extremity edema I suspect this is from her being on amlodipine-she apparently was started on diuretics for this reason Hold amlodipine on discharge.   Acute on chronic HFpEF See above   CKD stage IIIb Close to baseline   Chronic atrial fibrillation Rate controlled Telemetry monitoring Metoprolol/Eliquis   HTN BP stable on metoprolol.     HLD Statin   Hypothyroidism Levothyroxine TSH on 8/30 stable   GERD PPI   Morbid Obesity: Estimated body mass index is 34.48 kg/m as calculated from the following:   Height as of this encounter: 5\' 6"  (1.676 m).   Weight as of this encounter: 96.9 kg.   Obesity: Estimated body mass index is 34.48 kg/m as calculated from the following:   Height as of this encounter: 5\' 6"  (1.676 m).   Weight as of this encounter: 96.9 kg.    Discharge Diagnoses:  Principal Problem:   Hyponatremia Active Problems:   Chronic diastolic CHF (congestive heart failure) (HCC)   CKD stage 3b, GFR 30-44 ml/min (HCC)   History of CHF (congestive heart failure)   Permanent atrial fibrillation (HCC)   Hypothyroidism   GERD (gastroesophageal reflux disease)   Discharge Instructions:  Activity:  As tolerated   Discharge Instructions     (HEART FAILURE PATIENTS) Call MD:  Anytime you have any of the following symptoms: 1) 3 pound weight gain in 24 hours or 5 pounds in 1 week 2) shortness of breath, with or without a dry hacking cough 3) swelling in the hands, feet or stomach 4) if you have  to sleep on extra pillows at night in order to breathe.   Complete by: As directed    Call MD for:  difficulty breathing, headache or visual disturbances   Complete by: As directed    Call MD for:  extreme fatigue   Complete by: As directed    Diet - low sodium heart healthy   Complete by: As directed    Discharge instructions   Complete by: As directed    Follow with Primary MD  Creola Corn, MD within 1 week for repeat  chemistry panel  Stop amlodipine-probably because the leg swelling  Continue fluid restriction-1.5 L daily (this includes water/soda/Gatorade/tea/coffee)  Please get a complete blood count and chemistry panel checked by your Primary MD at your next visit, and again as instructed by your Primary MD.  Get Medicines reviewed and adjusted: Please take all your medications with you for your next visit with your Primary MD  Laboratory/radiological data: Please request your Primary MD to go over all hospital tests and procedure/radiological results at the follow up, please ask your Primary MD to get all Hospital records sent to his/her office.  In some cases, they will be blood work, cultures and biopsy results pending at the time of your discharge. Please request that your primary care M.D. follows up on these results.  Also Note the following: If you experience worsening of your admission symptoms, develop shortness of breath, life threatening emergency, suicidal or homicidal thoughts you must seek medical attention immediately by calling 911 or calling your MD immediately  if symptoms less severe.  You must read complete instructions/literature along with all the possible adverse reactions/side effects for all the Medicines you take and that have been prescribed to you. Take any new Medicines after you have completely understood and accpet all the possible adverse reactions/side effects.   Do not drive when taking Pain medications or sleeping medications (Benzodaizepines)  Do not take more than prescribed Pain, Sleep and Anxiety Medications. It is not advisable to combine anxiety,sleep and pain medications without talking with your primary care practitioner  Special Instructions: If you have smoked or chewed Tobacco  in the last 2 yrs please stop smoking, stop any regular Alcohol  and or any Recreational drug use.  Wear Seat belts while driving.  Please note: You were cared for by a  hospitalist during your hospital stay. Once you are discharged, your primary care physician will handle any further medical issues. Please note that NO REFILLS for any discharge medications will be authorized once you are discharged, as it is imperative that you return to your primary care physician (or establish a relationship with a primary care physician if you do not have one) for your post hospital discharge needs so that they can reassess your need for medications and monitor your lab values.   Increase activity slowly   Complete by: As directed       Allergies as of 08/06/2023       Reactions   Atorvastatin Other (See Comments)   Aches        Medication List     STOP taking these medications    amLODipine 5 MG tablet Commonly known as: NORVASC       TAKE these medications    acetaminophen 325 MG tablet Commonly known as: TYLENOL Take 325-650 mg by mouth at bedtime as needed for mild pain.   CALTRATE 600+D3 PO Take 1 tablet by mouth every evening.   [employer] Iron 325 (65 Fe)  MG tablet Generic drug: ferrous sulfate Take 325 mg by mouth daily at 6 PM.   cyanocobalamin 1000 MCG tablet Commonly known as: VITAMIN B12 Take 1,000 mcg by mouth daily.   Eliquis 5 MG Tabs tablet Generic drug: apixaban TAKE 1 TABLET BY MOUTH TWICE A DAY   furosemide 20 MG tablet Commonly known as: LASIX Take 1 tablet (20 mg total) by mouth daily.   levothyroxine 125 MCG tablet Commonly known as: SYNTHROID Take 125 mcg by mouth daily before breakfast.   metoprolol tartrate 50 MG tablet Commonly known as: LOPRESSOR Take 1.5 tablets (75 mg total) by mouth 2 (two) times daily. What changed: how much to take   omeprazole 20 MG capsule Commonly known as: PRILOSEC TAKE 1 CAPSULE BY MOUTH EVERY DAY   pravastatin 80 MG tablet Commonly known as: PRAVACHOL Take 80 mg by mouth in the morning.        Allergies  Allergen Reactions   Atorvastatin Other (See Comments)    Aches       Other Procedures/Studies: No results found.   TODAY-DAY OF DISCHARGE:  Subjective:   [name] today has no headache,no chest abdominal pain,no new weakness tingling or numbness, feels much better wants to go home today.   Objective:   Blood pressure 136/89, pulse 76, temperature (!) 97.1 F (36.2 C), resp. rate (!) 21, height 5\' 6"  (1.676 m), weight 96.9 kg, SpO2 96%. No intake or output data in the 24 hours ending 08/06/23 0845 Filed Weights   08/02/23 0500 08/03/23 0300 08/04/23 0500  Weight: 131 kg 93.7 kg 96.9 kg    Exam: Awake Alert, Oriented *3, No new F.N deficits, Normal affect [place].AT,PERRAL Supple Neck,No JVD, No cervical lymphadenopathy appriciated.  Symmetrical Chest wall movement, Good air movement bilaterally, CTAB RRR,No Gallops,Rubs or new Murmurs, No Parasternal Heave +ve B.Sounds, Abd Soft, Non tender, No organomegaly appriciated, No rebound -guarding or rigidity. No Cyanosis, Clubbing or edema, No new Rash or bruise   PERTINENT RADIOLOGIC STUDIES: No results found.   PERTINENT LAB RESULTS: CBC: No results for input(s): "WBC", "HGB", "HCT", "PLT" in the last 72 hours. CMET CMP     Component Value Date/Time   NA 127 (L) 08/06/2023 0656   NA 137 02/01/2021 1520   K 3.9 08/06/2023 0656   CL 92 (L) 08/06/2023 0656   CO2 23 08/06/2023 0656   GLUCOSE 96 08/06/2023 0656   BUN 28 (H) 08/06/2023 0656   BUN 12 02/01/2021 1520   CREATININE 1.19 (H) 08/06/2023 0656   CALCIUM 9.1 08/06/2023 0656   PROT 7.0 08/01/2023 1604   PROT 6.2 02/01/2021 1520   ALBUMIN 3.9 08/01/2023 1604   ALBUMIN 3.3 (L) 02/01/2021 1520   AST 21 08/01/2023 1604   ALT 17 08/01/2023 1604   ALKPHOS 55 08/01/2023 1604   BILITOT 0.9 08/01/2023 1604   BILITOT 0.4 02/01/2021 1520   EGFR 50 (L) 02/01/2021 1520   GFRNONAA 47 (L) 08/06/2023 0656    GFR Estimated Creatinine Clearance: 45.7 mL/min (A) (by C-G formula based on SCr of 1.19 mg/dL (H)). No results for input(s):  "LIPASE", "AMYLASE" in the last 72 hours. No results for input(s): "CKTOTAL", "CKMB", "CKMBINDEX", "TROPONINI" in the last 72 hours. Invalid input(s): "POCBNP" No results for input(s): "DDIMER" in the last 72 hours. No results for input(s): "HGBA1C" in the last 72 hours. No results for input(s): "CHOL", "HDL", "LDLCALC", "TRIG", "CHOLHDL", "LDLDIRECT" in the last 72 hours. No results for input(s): "TSH", "T4TOTAL", "T3FREE", "THYROIDAB" in the  last 72 hours.  Invalid input(s): "FREET3" No results for input(s): "VITAMINB12", "FOLATE", "FERRITIN", "TIBC", "IRON", "RETICCTPCT" in the last 72 hours. Coags: No results for input(s): "INR" in the last 72 hours.  Invalid input(s): "PT" Microbiology: No results found for this or any previous visit (from the past 240 hour(s)).  FURTHER DISCHARGE INSTRUCTIONS:  Get Medicines reviewed and adjusted: Please take all your medications with you for your next visit with your Primary MD  Laboratory/radiological data: Please request your Primary MD to go over all hospital tests and procedure/radiological results at the follow up, please ask your Primary MD to get all Hospital records sent to his/her office.  In some cases, they will be blood work, cultures and biopsy results pending at the time of your discharge. Please request that your primary care M.D. goes through all the records of your hospital data and follows up on these results.  Also Note the following: If you experience worsening of your admission symptoms, develop shortness of breath, life threatening emergency, suicidal or homicidal thoughts you must seek medical attention immediately by calling 911 or calling your MD immediately  if symptoms less severe.  You must read complete instructions/literature along with all the possible adverse reactions/side effects for all the Medicines you take and that have been prescribed to you. Take any new Medicines after you have completely understood and  accpet all the possible adverse reactions/side effects.   Do not drive when taking Pain medications or sleeping medications (Benzodaizepines)  Do not take more than prescribed Pain, Sleep and Anxiety Medications. It is not advisable to combine anxiety,sleep and pain medications without talking with your primary care practitioner  Special Instructions: If you have smoked or chewed Tobacco  in the last 2 yrs please stop smoking, stop any regular Alcohol  and or any Recreational drug use.  Wear Seat belts while driving.  Please note: You were cared for by a hospitalist during your hospital stay. Once you are discharged, your primary care physician will handle any further medical issues. Please note that NO REFILLS for any discharge medications will be authorized once you are discharged, as it is imperative that you return to your primary care physician (or establish a relationship with a primary care physician if you do not have one) for your post hospital discharge needs so that they can reassess your need for medications and monitor your lab values.  Total Time spent coordinating discharge including counseling, education and face to face time equals greater than 30 minutes.  SignedJeoffrey Massed 08/06/2023 8:45 AM

## 2023-08-06 NOTE — TOC Transition Note (Signed)
Transition of Care [facility]) - CM/SW Discharge Note   Patient Details  Name: [name] MRN: [id number] Date of Birth: Apr 11, 1946  [facility] CM/SW Contact:  Gordy Clement, RN Phone Number: 08/06/2023, 9:38 AM   Clinical Narrative:     Patient to DC to home today.  There are no recommendations for any follow up therapies.  Patient to follow up as directed on AVS. No additional TOC needs     Barriers to Discharge: Continued Medical Work up   Patient Goals and CMS Choice      Discharge Placement                         Discharge Plan and Services Additional resources added to the After Visit Summary for     Discharge Planning Services: CM Consult                                 Social Determinants of Health (SDOH) Interventions SDOH Screenings   Food Insecurity: No Food Insecurity (08/01/2023)  Housing: Patient Declined (08/01/2023)  Transportation Needs: No Transportation Needs (08/01/2023)  Utilities: Not At Risk (08/01/2023)  Tobacco Use: Low Risk  (08/01/2023)     Readmission Risk Interventions     No data to display

## 2023-08-12 ENCOUNTER — Ambulatory Visit: Payer: Medicare HMO | Admitting: Cardiology

## 2023-08-12 ENCOUNTER — Encounter: Payer: Self-pay | Admitting: Cardiology

## 2023-08-12 VITALS — BP 126/80 | HR 92 | Resp 16 | Ht 66.0 in | Wt 205.4 lb

## 2023-08-12 DIAGNOSIS — I4811 Longstanding persistent atrial fibrillation: Secondary | ICD-10-CM

## 2023-08-12 DIAGNOSIS — M1990 Unspecified osteoarthritis, unspecified site: Secondary | ICD-10-CM | POA: Diagnosis not present

## 2023-08-12 DIAGNOSIS — D6869 Other thrombophilia: Secondary | ICD-10-CM | POA: Diagnosis not present

## 2023-08-12 DIAGNOSIS — I509 Heart failure, unspecified: Secondary | ICD-10-CM | POA: Diagnosis not present

## 2023-08-12 DIAGNOSIS — N3941 Urge incontinence: Secondary | ICD-10-CM | POA: Diagnosis not present

## 2023-08-12 DIAGNOSIS — E669 Obesity, unspecified: Secondary | ICD-10-CM | POA: Diagnosis not present

## 2023-08-12 DIAGNOSIS — I5032 Chronic diastolic (congestive) heart failure: Secondary | ICD-10-CM

## 2023-08-12 DIAGNOSIS — M8580 Other specified disorders of bone density and structure, unspecified site: Secondary | ICD-10-CM | POA: Diagnosis not present

## 2023-08-12 DIAGNOSIS — K219 Gastro-esophageal reflux disease without esophagitis: Secondary | ICD-10-CM | POA: Diagnosis not present

## 2023-08-12 DIAGNOSIS — I110 Hypertensive heart disease with heart failure: Secondary | ICD-10-CM | POA: Diagnosis not present

## 2023-08-12 DIAGNOSIS — I700 Atherosclerosis of aorta: Secondary | ICD-10-CM | POA: Diagnosis not present

## 2023-08-12 DIAGNOSIS — Z008 Encounter for other general examination: Secondary | ICD-10-CM | POA: Diagnosis not present

## 2023-08-12 DIAGNOSIS — E039 Hypothyroidism, unspecified: Secondary | ICD-10-CM | POA: Diagnosis not present

## 2023-08-12 DIAGNOSIS — E785 Hyperlipidemia, unspecified: Secondary | ICD-10-CM | POA: Diagnosis not present

## 2023-08-12 DIAGNOSIS — D6859 Other primary thrombophilia: Secondary | ICD-10-CM | POA: Diagnosis not present

## 2023-08-12 NOTE — Progress Notes (Signed)
Subjective:   [name], female    DOB: [birth year], 77 y.o.   MRN: [id number]   Chief Complaint  Patient presents with   Edema   burning/aching in legs    77 year old Caucasian female with hypertension, persistent atrial fibrillation, moderate obesity, HFpEF, pericardial effusion s/p pericardiocentesis (01/2021)  Patient was hospitalized in 08/2023 with severe hyponatremia.  She was started on combination of spironolactone and Lasix for leg edema, and subsequently found to have hyponatremia down to sodium of 120.  This was thought to be combination of excessive free water intake, and recent spironolactone use.  Sodium increased to 129 on discharge.  Spironolactone was not resumed during hospitalization.  Also, amlodipine was discontinued, as it was thought to be contributing to her leg edema. It appears that she had hospital delirium, possibly contributed by hyponatremia, during hr hospital stay.   Since hospital discharge, she has been doing well. She is here with her daughter today. She denies chest pain, shortness of breath, palpitations, leg edema, orthopnea, PND, TIA/syncope.    Current Outpatient Medications:    acetaminophen (TYLENOL) 325 MG tablet, Take 325-650 mg by mouth at bedtime as needed for mild pain., Disp: , Rfl:    Calcium Carb-Cholecalciferol (CALTRATE 600+D3 PO), Take 1 tablet by mouth every evening., Disp: , Rfl:    [employer] IRON 325 (65 Fe) MG tablet, Take 325 mg by mouth daily at 6 PM., Disp: , Rfl:    ELIQUIS 5 MG TABS tablet, TAKE 1 TABLET BY MOUTH TWICE A DAY, Disp: 60 tablet, Rfl: 5   furosemide (LASIX) 20 MG tablet, Take 1 tablet (20 mg total) by mouth daily., Disp: 30 tablet, Rfl: 1   levothyroxine (SYNTHROID, LEVOTHROID) 125 MCG tablet, Take 125 mcg by mouth daily before breakfast., Disp: , Rfl:    metoprolol tartrate (LOPRESSOR) 50 MG tablet, Take 1.5 tablets (75 mg total) by mouth 2 (two) times daily., Disp: 60 tablet, Rfl: 1   omeprazole (PRILOSEC) 20 MG  capsule, TAKE 1 CAPSULE BY MOUTH EVERY DAY, Disp: 90 capsule, Rfl: 1   pravastatin (PRAVACHOL) 80 MG tablet, Take 80 mg by mouth in the morning., Disp: , Rfl:    vitamin B-12 (CYANOCOBALAMIN) 1000 MCG tablet, Take 1,000 mcg by mouth daily., Disp: , Rfl:   Cardiovascular and other pertinent studies:  EKG 08/01/2023: Atrial fibrillation IVCD, consider atypical LBBB  Echocardiogram 02/07/2021:  Left ventricle cavity is normal in size and wall thickness. Normal LV  systolic function with visual EF 50-55%. Normal global wall motion. Unable  to evaluate diastolic function due to atrial fibrillation.  Left atrial cavity is moderately dilated.  Structurally normal trileaflet aortic valve. Mild aortic valve stenosis.  Vmax 1.8 m/sec, mean PG 9 mmHg, AVA 1.1 cm2 by continuity equation  Mild tricuspid regurgitation.  No evidence of pulmonary hypertension.  No significant change compared to previous study on 01/25/2021.   EKG 02/01/2021: Atrial fibrillation 79 bpm Low voltage in precordial leads Anterior infarct -age undetermined Nonspecific ST-T changes. Low suspicion for dig toxicity  CXT 01/12/2019: Slowly resolving congestive failure pattern. Possible superimposed pneumonia in LLL  EKG 12/25/2020: Atrial fibrillation 77 bpm  Recent labs: 08/06/2023: Glucose 96, BUN/Cr 28/1.19. EGFR 47. Na/K 129/3.7. H/H 15/45. MCV 80. Platelets 240 TSH 1.0 normal  08/04/2023: Glucose 91, BUN/Cr 28/1.48. EGFR 36. Na/K 120/4.9.  07/04/2021: Glucose 99, BUN/Cr 30/?. EGFR 29  06/22/2021: Glucose 97, BUN/Cr 36/1.79. EGFR 29. Na/K 134/4.8.  H/H 14/45. MCV 84. Platelets 325  02/01/2021: Glucose 83,  BUN/Cr 12/1.14. EGFR 50. Na/K 137/3.7. Albumin 3.3. Rest of the CMP normal H/H 10.8/35.9. MCV 71.7. Platelets 356 HbA1C N/A Lipid panel N/A TSH N/A  01/30/2021: BNP 429  01/12/2021: Glucose 67, BUN/Cr 26/1.2. EGFR 43. Na/K 134/4.4. Rest of the CMP normal  12/25/2020: BNP 265  09/14/2019: Glucose 108, BUN/Cr  16/1.4. EGFR ?. Na/K ?/?.  Rest of the CMP normal.  Chol 183, TG 123, HDL 47, LDL 111    Review of Systems  Cardiovascular:  Negative for chest pain, dyspnea on exertion, leg swelling, palpitations and syncope.  Respiratory:  Negative for shortness of breath.   Gastrointestinal:  Positive for nausea. Negative for abdominal pain, diarrhea and vomiting.        Vitals:   08/12/23 1505  BP: 126/80  Pulse: 92  Resp: 16  SpO2: 96%     Body mass index is 33.15 kg/m.   Objective:   Physical Exam Vitals and nursing note reviewed.  Constitutional:      Appearance: She is well-developed.  Neck:     Vascular: No JVD.  Cardiovascular:     Rate and Rhythm: Normal rate. Rhythm irregular.     Pulses: Intact distal pulses.     Heart sounds: Normal heart sounds. No murmur heard. Pulmonary:     Effort: No respiratory distress.     Breath sounds: No wheezing or rales.  Musculoskeletal:     Right lower leg: No edema.     Left lower leg: No edema.         Assessment & Recommendations:   77 year old Caucasian female with hypertension, persistent atrial fibrillation, moderate obesity, HFpEF  Chronic HFpEF:. Euvolumic. Recent leg edema could have been due to amlodipine, and hyponatremia due to combination of spironolactone and lasix. Use lasix as needed/ Patient is going to get repeat labs with hPCP tomorrow.   Persistent Afib: Rate controlled on metoprolol tartrate 50 mg bid, diltiazem 120 mg daily. CHA2DS2VASc score 3: Annual stroke risk 4% Continue eliquis 5 mg bid.   Pericardial effusion: S/p pericardiocentesis 01/2021 No recurrence  Hypertension: Controlled.  F/u in 6 months   Elder Negus, MD Pager: [phone] Office: [phone]

## 2023-08-13 DIAGNOSIS — E785 Hyperlipidemia, unspecified: Secondary | ICD-10-CM | POA: Diagnosis not present

## 2023-08-13 DIAGNOSIS — I130 Hypertensive heart and chronic kidney disease with heart failure and stage 1 through stage 4 chronic kidney disease, or unspecified chronic kidney disease: Secondary | ICD-10-CM | POA: Diagnosis not present

## 2023-08-13 DIAGNOSIS — R600 Localized edema: Secondary | ICD-10-CM | POA: Diagnosis not present

## 2023-08-13 DIAGNOSIS — E039 Hypothyroidism, unspecified: Secondary | ICD-10-CM | POA: Diagnosis not present

## 2023-08-13 DIAGNOSIS — E871 Hypo-osmolality and hyponatremia: Secondary | ICD-10-CM | POA: Diagnosis not present

## 2023-08-13 DIAGNOSIS — I480 Paroxysmal atrial fibrillation: Secondary | ICD-10-CM | POA: Diagnosis not present

## 2023-08-13 DIAGNOSIS — N1831 Chronic kidney disease, stage 3a: Secondary | ICD-10-CM | POA: Diagnosis not present

## 2023-08-13 DIAGNOSIS — K219 Gastro-esophageal reflux disease without esophagitis: Secondary | ICD-10-CM | POA: Diagnosis not present

## 2023-08-13 DIAGNOSIS — I5022 Chronic systolic (congestive) heart failure: Secondary | ICD-10-CM | POA: Diagnosis not present

## 2023-08-22 DIAGNOSIS — E871 Hypo-osmolality and hyponatremia: Secondary | ICD-10-CM | POA: Diagnosis not present

## 2023-08-22 DIAGNOSIS — R059 Cough, unspecified: Secondary | ICD-10-CM | POA: Diagnosis not present

## 2023-08-26 DIAGNOSIS — E871 Hypo-osmolality and hyponatremia: Secondary | ICD-10-CM | POA: Diagnosis not present

## 2023-08-26 DIAGNOSIS — I480 Paroxysmal atrial fibrillation: Secondary | ICD-10-CM | POA: Diagnosis not present

## 2023-08-26 DIAGNOSIS — R600 Localized edema: Secondary | ICD-10-CM | POA: Diagnosis not present

## 2023-08-26 DIAGNOSIS — N1831 Chronic kidney disease, stage 3a: Secondary | ICD-10-CM | POA: Diagnosis not present

## 2023-08-26 DIAGNOSIS — I130 Hypertensive heart and chronic kidney disease with heart failure and stage 1 through stage 4 chronic kidney disease, or unspecified chronic kidney disease: Secondary | ICD-10-CM | POA: Diagnosis not present

## 2023-08-26 DIAGNOSIS — Z1152 Encounter for screening for COVID-19: Secondary | ICD-10-CM | POA: Diagnosis not present

## 2023-08-26 DIAGNOSIS — I5022 Chronic systolic (congestive) heart failure: Secondary | ICD-10-CM | POA: Diagnosis not present

## 2023-08-26 DIAGNOSIS — R109 Unspecified abdominal pain: Secondary | ICD-10-CM | POA: Diagnosis not present

## 2023-08-26 DIAGNOSIS — R638 Other symptoms and signs concerning food and fluid intake: Secondary | ICD-10-CM | POA: Diagnosis not present

## 2023-08-26 DIAGNOSIS — K219 Gastro-esophageal reflux disease without esophagitis: Secondary | ICD-10-CM | POA: Diagnosis not present

## 2023-08-26 DIAGNOSIS — R051 Acute cough: Secondary | ICD-10-CM | POA: Diagnosis not present

## 2023-08-28 ENCOUNTER — Inpatient Hospital Stay (HOSPITAL_COMMUNITY)
Admission: EM | Admit: 2023-08-28 | Discharge: 2023-09-02 | DRG: 286 | Disposition: A | Discharge status: Home / Self Care | Payer: Medicare HMO | Attending: Internal Medicine | Admitting: Internal Medicine

## 2023-08-28 ENCOUNTER — Emergency Department (HOSPITAL_COMMUNITY): Payer: Medicare HMO

## 2023-08-28 ENCOUNTER — Encounter (HOSPITAL_COMMUNITY): Payer: Self-pay | Admitting: Emergency Medicine

## 2023-08-28 ENCOUNTER — Other Ambulatory Visit: Payer: Self-pay

## 2023-08-28 DIAGNOSIS — E66811 Other obesity due to excess calories: Secondary | ICD-10-CM

## 2023-08-28 DIAGNOSIS — Z7901 Long term (current) use of anticoagulants: Secondary | ICD-10-CM

## 2023-08-28 DIAGNOSIS — M8580 Other specified disorders of bone density and structure, unspecified site: Secondary | ICD-10-CM | POA: Diagnosis present

## 2023-08-28 DIAGNOSIS — E8779 Other fluid overload: Secondary | ICD-10-CM

## 2023-08-28 DIAGNOSIS — I509 Heart failure, unspecified: Secondary | ICD-10-CM

## 2023-08-28 DIAGNOSIS — Z23 Encounter for immunization: Secondary | ICD-10-CM

## 2023-08-28 DIAGNOSIS — I5023 Acute on chronic systolic (congestive) heart failure: Secondary | ICD-10-CM | POA: Diagnosis present

## 2023-08-28 DIAGNOSIS — Z6834 Body mass index (BMI) 34.0-34.9, adult: Secondary | ICD-10-CM

## 2023-08-28 DIAGNOSIS — R918 Other nonspecific abnormal finding of lung field: Secondary | ICD-10-CM | POA: Diagnosis not present

## 2023-08-28 DIAGNOSIS — E871 Hypo-osmolality and hyponatremia: Principal | ICD-10-CM | POA: Diagnosis present

## 2023-08-28 DIAGNOSIS — I4821 Permanent atrial fibrillation: Secondary | ICD-10-CM | POA: Diagnosis present

## 2023-08-28 DIAGNOSIS — Z8719 Personal history of other diseases of the digestive system: Secondary | ICD-10-CM

## 2023-08-28 DIAGNOSIS — I350 Nonrheumatic aortic (valve) stenosis: Secondary | ICD-10-CM | POA: Diagnosis present

## 2023-08-28 DIAGNOSIS — Z8249 Family history of ischemic heart disease and other diseases of the circulatory system: Secondary | ICD-10-CM

## 2023-08-28 DIAGNOSIS — R441 Visual hallucinations: Secondary | ICD-10-CM | POA: Diagnosis present

## 2023-08-28 DIAGNOSIS — I10 Essential (primary) hypertension: Secondary | ICD-10-CM | POA: Diagnosis present

## 2023-08-28 DIAGNOSIS — Z79899 Other long term (current) drug therapy: Secondary | ICD-10-CM

## 2023-08-28 DIAGNOSIS — E861 Hypovolemia: Secondary | ICD-10-CM | POA: Diagnosis present

## 2023-08-28 DIAGNOSIS — Z96652 Presence of left artificial knee joint: Secondary | ICD-10-CM | POA: Diagnosis present

## 2023-08-28 DIAGNOSIS — K219 Gastro-esophageal reflux disease without esophagitis: Secondary | ICD-10-CM | POA: Diagnosis present

## 2023-08-28 DIAGNOSIS — T502X5A Adverse effect of carbonic-anhydrase inhibitors, benzothiadiazides and other diuretics, initial encounter: Secondary | ICD-10-CM | POA: Diagnosis present

## 2023-08-28 DIAGNOSIS — N1832 Chronic kidney disease, stage 3b: Secondary | ICD-10-CM | POA: Diagnosis present

## 2023-08-28 DIAGNOSIS — E039 Hypothyroidism, unspecified: Secondary | ICD-10-CM | POA: Diagnosis present

## 2023-08-28 DIAGNOSIS — N179 Acute kidney failure, unspecified: Secondary | ICD-10-CM | POA: Diagnosis present

## 2023-08-28 DIAGNOSIS — E6609 Other obesity due to excess calories: Secondary | ICD-10-CM | POA: Diagnosis present

## 2023-08-28 DIAGNOSIS — Z79890 Hormone replacement therapy (postmenopausal): Secondary | ICD-10-CM

## 2023-08-28 DIAGNOSIS — I3139 Other pericardial effusion (noninflammatory): Secondary | ICD-10-CM | POA: Diagnosis present

## 2023-08-28 DIAGNOSIS — Z515 Encounter for palliative care: Secondary | ICD-10-CM

## 2023-08-28 DIAGNOSIS — I517 Cardiomegaly: Secondary | ICD-10-CM | POA: Diagnosis not present

## 2023-08-28 DIAGNOSIS — I5032 Chronic diastolic (congestive) heart failure: Secondary | ICD-10-CM | POA: Diagnosis present

## 2023-08-28 DIAGNOSIS — I5033 Acute on chronic diastolic (congestive) heart failure: Secondary | ICD-10-CM | POA: Diagnosis present

## 2023-08-28 DIAGNOSIS — D631 Anemia in chronic kidney disease: Secondary | ICD-10-CM | POA: Diagnosis present

## 2023-08-28 DIAGNOSIS — I5041 Acute combined systolic (congestive) and diastolic (congestive) heart failure: Secondary | ICD-10-CM

## 2023-08-28 DIAGNOSIS — E785 Hyperlipidemia, unspecified: Secondary | ICD-10-CM | POA: Diagnosis present

## 2023-08-28 DIAGNOSIS — I130 Hypertensive heart and chronic kidney disease with heart failure and stage 1 through stage 4 chronic kidney disease, or unspecified chronic kidney disease: Principal | ICD-10-CM | POA: Diagnosis present

## 2023-08-28 DIAGNOSIS — I110 Hypertensive heart disease with heart failure: Secondary | ICD-10-CM | POA: Diagnosis not present

## 2023-08-28 DIAGNOSIS — I5043 Acute on chronic combined systolic (congestive) and diastolic (congestive) heart failure: Secondary | ICD-10-CM | POA: Diagnosis present

## 2023-08-28 DIAGNOSIS — Z888 Allergy status to other drugs, medicaments and biological substances status: Secondary | ICD-10-CM

## 2023-08-28 DIAGNOSIS — E8770 Fluid overload, unspecified: Secondary | ICD-10-CM | POA: Diagnosis present

## 2023-08-28 DIAGNOSIS — R0989 Other specified symptoms and signs involving the circulatory and respiratory systems: Secondary | ICD-10-CM | POA: Diagnosis not present

## 2023-08-28 LAB — COMPREHENSIVE METABOLIC PANEL
ALT: 17 U/L (ref 0–44)
AST: 20 U/L (ref 15–41)
Albumin: 4 g/dL (ref 3.5–5.0)
Alkaline Phosphatase: 51 U/L (ref 38–126)
Anion gap: 12 (ref 5–15)
BUN: 19 mg/dL (ref 8–23)
CO2: 25 mmol/L (ref 22–32)
Calcium: 9.2 mg/dL (ref 8.9–10.3)
Chloride: 89 mmol/L — ABNORMAL LOW (ref 98–111)
Creatinine, Ser: 1.14 mg/dL — ABNORMAL HIGH (ref 0.44–1.00)
GFR, Estimated: 49 mL/min — ABNORMAL LOW (ref >60)
Glucose, Bld: 113 mg/dL — ABNORMAL HIGH (ref 70–99)
Potassium: 3.5 mmol/L (ref 3.5–5.1)
Sodium: 126 mmol/L — ABNORMAL LOW (ref 135–145)
Total Bilirubin: 1 mg/dL (ref 0.3–1.2)
Total Protein: 7 g/dL (ref 6.5–8.1)

## 2023-08-28 LAB — URINALYSIS, ROUTINE W REFLEX MICROSCOPIC
Bilirubin Urine: NEGATIVE
Glucose, UA: NEGATIVE mg/dL
Ketones, ur: 5 mg/dL — AB
Leukocytes,Ua: NEGATIVE
Nitrite: NEGATIVE
Protein, ur: 30 mg/dL — AB
Specific Gravity, Urine: 1.009 (ref 1.005–1.030)
pH: 6 (ref 5.0–8.0)

## 2023-08-28 LAB — CBC WITH DIFFERENTIAL/PLATELET
Abs Immature Granulocytes: 0.04 10*3/uL (ref 0.00–0.07)
Basophils Absolute: 0 10*3/uL (ref 0.0–0.1)
Basophils Relative: 0 %
Eosinophils Absolute: 0 10*3/uL (ref 0.0–0.5)
Eosinophils Relative: 0 %
HCT: 41.6 % (ref 36.0–46.0)
Hemoglobin: 13.9 g/dL (ref 12.0–15.0)
Immature Granulocytes: 0 %
Lymphocytes Relative: 7 %
Lymphs Abs: 0.7 10*3/uL (ref 0.7–4.0)
MCH: 27.5 pg (ref 26.0–34.0)
MCHC: 33.4 g/dL (ref 30.0–36.0)
MCV: 82.2 fL (ref 80.0–100.0)
Monocytes Absolute: 0.6 10*3/uL (ref 0.1–1.0)
Monocytes Relative: 6 %
Neutro Abs: 8.3 10*3/uL — ABNORMAL HIGH (ref 1.7–7.7)
Neutrophils Relative %: 87 %
Platelets: 356 10*3/uL (ref 150–400)
RBC: 5.06 MIL/uL (ref 3.87–5.11)
RDW: 14.2 % (ref 11.5–15.5)
WBC: 9.6 10*3/uL (ref 4.0–10.5)
nRBC: 0 % (ref 0.0–0.2)

## 2023-08-28 LAB — BRAIN NATRIURETIC PEPTIDE: B Natriuretic Peptide: 1037.6 pg/mL — ABNORMAL HIGH (ref 0.0–100.0)

## 2023-08-28 LAB — OSMOLALITY, URINE: Osmolality, Ur: 363 mOsm/kg (ref 300–900)

## 2023-08-28 LAB — SODIUM, URINE, RANDOM: Sodium, Ur: 84 mmol/L

## 2023-08-28 LAB — OSMOLALITY: Osmolality: 273 mOsm/kg — ABNORMAL LOW (ref 275–295)

## 2023-08-28 MED ORDER — FUROSEMIDE 10 MG/ML IJ SOLN
40.0000 mg | Freq: Every day | INTRAMUSCULAR | Status: DC
  Administered 2023-08-29: 40 mg via INTRAVENOUS
  Filled 2023-08-28: qty 4

## 2023-08-28 MED ORDER — PRAVASTATIN SODIUM 40 MG PO TABS
80.0000 mg | ORAL_TABLET | Freq: Every day | ORAL | Status: DC
  Administered 2023-08-29 – 2023-09-02 (×5): 80 mg via ORAL
  Filled 2023-08-28 (×5): qty 2

## 2023-08-28 MED ORDER — ACETAMINOPHEN 650 MG RE SUPP: 650.0000 mg | Freq: Four times a day (QID) | RECTAL | Status: DC | PRN

## 2023-08-28 MED ORDER — PANTOPRAZOLE SODIUM 40 MG PO TBEC
40.0000 mg | DELAYED_RELEASE_TABLET | Freq: Every day | ORAL | Status: DC
  Administered 2023-08-29 – 2023-09-02 (×5): 40 mg via ORAL
  Filled 2023-08-28 (×5): qty 1

## 2023-08-28 MED ORDER — INFLUENZA VAC A&B SURF ANT ADJ 0.5 ML IM SUSY
0.5000 mL | PREFILLED_SYRINGE | INTRAMUSCULAR | Status: AC
  Administered 2023-08-29: 0.5 mL via INTRAMUSCULAR
  Filled 2023-08-28: qty 0.5

## 2023-08-28 MED ORDER — APIXABAN 5 MG PO TABS
5.0000 mg | ORAL_TABLET | Freq: Two times a day (BID) | ORAL | Status: DC
  Administered 2023-08-28 – 2023-08-30 (×5): 5 mg via ORAL
  Filled 2023-08-28 (×5): qty 1

## 2023-08-28 MED ORDER — LEVOTHYROXINE SODIUM 125 MCG PO TABS
125.0000 ug | ORAL_TABLET | Freq: Every day | ORAL | Status: DC
  Administered 2023-08-29 – 2023-09-02 (×4): 125 ug via ORAL
  Filled 2023-08-28 (×5): qty 1

## 2023-08-28 MED ORDER — METOPROLOL TARTRATE 50 MG PO TABS
75.0000 mg | ORAL_TABLET | Freq: Two times a day (BID) | ORAL | Status: DC
  Administered 2023-08-28 – 2023-09-02 (×10): 75 mg via ORAL
  Filled 2023-08-28 (×10): qty 1

## 2023-08-28 MED ORDER — FUROSEMIDE 10 MG/ML IJ SOLN
40.0000 mg | Freq: Once | INTRAMUSCULAR | Status: AC
  Administered 2023-08-28: 40 mg via INTRAVENOUS
  Filled 2023-08-28: qty 4

## 2023-08-28 MED ORDER — FERROUS SULFATE 325 (65 FE) MG PO TABS
325.0000 mg | ORAL_TABLET | Freq: Every day | ORAL | Status: DC
  Administered 2023-08-28 – 2023-09-01 (×5): 325 mg via ORAL
  Filled 2023-08-28 (×5): qty 1

## 2023-08-28 MED ORDER — ACETAMINOPHEN 325 MG PO TABS
650.0000 mg | ORAL_TABLET | Freq: Four times a day (QID) | ORAL | Status: DC | PRN
  Administered 2023-08-30 – 2023-09-02 (×2): 650 mg via ORAL
  Filled 2023-08-28 (×2): qty 2

## 2023-08-28 MED ORDER — VITAMIN B-12 1000 MCG PO TABS
1000.0000 ug | ORAL_TABLET | Freq: Every day | ORAL | Status: DC
  Administered 2023-08-29 – 2023-09-02 (×5): 1000 ug via ORAL
  Filled 2023-08-28 (×5): qty 1

## 2023-08-28 NOTE — ED Provider Notes (Signed)
[place] EMERGENCY DEPARTMENT AT [facility] Provider Note   CSN: [id number] Arrival date & time: 08/28/23  1109     History  Chief Complaint  Patient presents with   Weakness    [name] is a 77 y.o. female.   Weakness    Patient has a history of hypertension atrial fibrillation CHF anemia, chronic kidney disease, hypothyroidism.  Patient was recently admitted to the hospital on August 30 for hyponatremia.  Hyponatremia felt to be related to diuretic and increase free water intake.  Patient was started on Lasix and fluid restriction.  Sodium levels improved up to 127 at the time of discharge.  Patient states she has been feeling a bit more weak and fatigued over the last few days.  She also has been intermittently confused states she has had some intermittent visual hallucinations.  States she saw her doctor the other day and was notified that her sodium level was low again and she needed to come to the hospital  Home Medications Prior to Admission medications   Medication Sig Start Date End Date Taking? Authorizing Provider  acetaminophen (TYLENOL) 325 MG tablet Take 325-650 mg by mouth at bedtime as needed for mild pain.    [provider]  Calcium Carb-Cholecalciferol (CALTRATE 600+D3 PO) Take 1 tablet by mouth every evening.    [provider]  [employer] IRON 325 (65 Fe) MG tablet Take 325 mg by mouth daily at 6 PM. 01/16/21   [provider]  ELIQUIS 5 MG TABS tablet TAKE 1 TABLET BY MOUTH TWICE A DAY 07/07/23   Patwardhan, Manish J, MD  furosemide (LASIX) 20 MG tablet Take 1 tablet (20 mg total) by mouth daily. 08/06/23   Ghimire, Werner Lean, MD  levothyroxine (SYNTHROID, LEVOTHROID) 125 MCG tablet Take 125 mcg by mouth daily before breakfast. 06/19/12   [provider]  metoprolol tartrate (LOPRESSOR) 50 MG tablet Take 1.5 tablets (75 mg total) by mouth 2 (two) times daily. 08/06/23   Ghimire, Werner Lean, MD  omeprazole (PRILOSEC) 20 MG  capsule TAKE 1 CAPSULE BY MOUTH EVERY DAY 12/21/21   Hilarie Fredrickson, MD  pravastatin (PRAVACHOL) 80 MG tablet Take 80 mg by mouth in the morning. 07/16/12   [provider]  vitamin B-12 (CYANOCOBALAMIN) 1000 MCG tablet Take 1,000 mcg by mouth daily. 01/16/21   [provider]      Allergies    Atorvastatin    Review of Systems   Review of Systems  Neurological:  Positive for weakness.    Physical Exam Updated Vital Signs BP (!) 164/87   Pulse 75   Temp (!) 97.4 F (36.3 C) (Oral)   Resp 16   Ht 1.676 m (5\' 6" )   Wt 93.2 kg   SpO2 95%   BMI 33.16 kg/m  Physical Exam Vitals and nursing note reviewed.  Constitutional:      Appearance: She is well-developed. She is not diaphoretic.  HENT:     Head: Normocephalic and atraumatic.     Right Ear: External ear normal.     Left Ear: External ear normal.  Eyes:     General: No scleral icterus.       Right eye: No discharge.        Left eye: No discharge.     Conjunctiva/sclera: Conjunctivae normal.  Neck:     Trachea: No tracheal deviation.  Cardiovascular:     Rate and Rhythm: Normal rate and regular rhythm.  Pulmonary:  Effort: Pulmonary effort is normal. No respiratory distress.     Breath sounds: Normal breath sounds. No stridor. No wheezing or rales.  Abdominal:     General: Bowel sounds are normal. There is no distension.     Palpations: Abdomen is soft.     Tenderness: There is no abdominal tenderness. There is no guarding or rebound.  Musculoskeletal:        General: No tenderness or deformity.     Cervical back: Neck supple.     Right lower leg: Edema present.     Left lower leg: Edema present.  Skin:    General: Skin is warm and dry.     Findings: No rash.  Neurological:     General: No focal deficit present.     Mental Status: She is alert.     Cranial Nerves: No cranial nerve deficit, dysarthria or facial asymmetry.     Sensory: No sensory deficit.     Motor: No abnormal muscle tone or  seizure activity.     Coordination: Coordination normal.  Psychiatric:        Mood and Affect: Mood normal.     ED Results / Procedures / Treatments   Labs (all labs ordered are listed, but only abnormal results are displayed) Labs Reviewed  CBC WITH DIFFERENTIAL/PLATELET - Abnormal; Notable for the following components:      Result Value   Neutro Abs 8.3 (*)    All other components within normal limits  COMPREHENSIVE METABOLIC PANEL - Abnormal; Notable for the following components:   Sodium 126 (*)    Chloride 89 (*)    Glucose, Bld 113 (*)    Creatinine, Ser 1.14 (*)    GFR, Estimated 49 (*)    All other components within normal limits  BRAIN NATRIURETIC PEPTIDE - Abnormal; Notable for the following components:   B Natriuretic Peptide 1,037.6 (*)    All other components within normal limits  SODIUM, URINE, RANDOM  URINALYSIS, ROUTINE W REFLEX MICROSCOPIC  OSMOLALITY, URINE  OSMOLALITY    EKG None  Radiology No results found.  Procedures Procedures    Medications Ordered in ED Medications  furosemide (LASIX) injection 40 mg (has no administration in time range)    ED Course/ Medical Decision Making/ A&P Clinical Course as of 08/28/23 1539  Thu Aug 28, 2023  1434 Sodium level decreased at 126.  Similar to her discharge sodium level [JK]  1434 CBC with Differential(!) CBC normal [JK]  1512 Chest x-ray shows evidence of cardiomegaly pulmonary edema [JK]  1512 Brain natriuretic peptide(!) BNP elevated compared to previous [JK]    Clinical Course User Index [JK] Linwood Dibbles, MD                                 Medical Decision Making Problems Addressed: Congestive heart failure, unspecified HF chronicity, unspecified heart failure type [facility]): chronic illness or injury with exacerbation, progression, or side effects of treatment Hyponatremia: chronic illness or injury with exacerbation, progression, or side effects of treatment  Amount and/or Complexity of  Data Reviewed Labs: ordered. Decision-making details documented in ED Course. Radiology: ordered and independent interpretation performed.  Risk Prescription drug management.   Patient presented to the ED for evaluation of low sodium level intermittent confusion.  Patient does have low sodium level at 126 but not significantly changed since she left the hospital.  Patient does however have evidence of fluid retention  and elevated BNP suggesting overall volume overload.  Patient's urine sodium however is elevated.  Will give her a dose of diuretics.  I will consult the medical service for admission.        Final Clinical Impression(s) / ED Diagnoses Final diagnoses:  Hyponatremia  Congestive heart failure, unspecified HF chronicity, unspecified heart failure type [facility])    Rx / DC Orders ED Discharge Orders     None         Linwood Dibbles, MD 08/28/23 1539

## 2023-08-28 NOTE — Hospital Course (Addendum)
77yo with h/o HTN, afib-on Eliquis, chronic HFpEF, pericardial effusion s/p pericardiocentesis 01/2021, CKD stage IIIb, HTN, GERD, and class 1 obesity who was recently hospitalized in September for severe hyponatremia (from combination of excessive free water intake, Aldactone), Na++ 127 at dc, was sent on 9/26 from PCP office due to abnormal labs.  + nocturnal hallucinations, some abdominal pain, LE edema, orthopnea. She was admitted for acute on chronic diastolic CHF and treated with Lasix.  Cardiology consulted.

## 2023-08-28 NOTE — ED Notes (Signed)
   08/28/23 1100  Neurological  Neuro (WDL) X  Orientation Level Oriented X4  Cognition Appropriate at baseline;Follows commands  Speech Clear

## 2023-08-28 NOTE — Plan of Care (Signed)
  Problem: Education: Goal: Knowledge of General Education information will improve Description: Including pain rating scale, medication(s)/side effects and non-pharmacologic comfort measures Outcome: Progressing   Problem: Education: Goal: Ability to demonstrate management of disease process will improve Outcome: Progressing Goal: Ability to verbalize understanding of medication therapies will improve Outcome: Progressing   Problem: Cardiac: Goal: Ability to achieve and maintain adequate cardiopulmonary perfusion will improve Outcome: Progressing

## 2023-08-28 NOTE — H&P (Signed)
History and Physical    [name] VHQ:[id number] DOB: 01/25/1946 DOA: 08/28/2023  PCP: Creola Corn, MD   Patient coming from: PCP  Chief Complaint  Patient presents with   Weakness     HPI:77 year old female with HTN, persistent A-fib-on Eliquis, chronic HFpEF, pericardial effusion s/p pericardiocentesis 01/2021, CKD stage IIIb, HTN, GERD, obesity recently hospitalized in September with severe hyponatremia-from combination of excessive free water intake, Aldactone-on discharge sodium was 127, is now sent from PCP office sent from PCP office due to abnormal labs.  Per husband patient has been having some hallucination at night, some abdominal pain about a week without nausea vomiting or diarrhea.  Currently she is alert awake oriented x 3, no auditory or visual agitation reported.  She reports for the past week or so she has been having leg swelling and also orthopnea has not had good sleep due as she feels like " it is feeling up from below", and she easily gets fatigued and short of breath with minimal activity. Patient otherwise denies any nausea, vomiting, chest pain, fever, chills, headache, focal weakness, numbness tingling, speech difficulties   In the ED: Afebrile BP stable in 150s to 160s, not hypoxic, labs showed sodium 126, bnp 1037, last one 188, was 125 at PCP office,creatinine 1.14 CBC stable.Underwent chest x-ray: Result pending., urinary lites showing sodium 84 UA pending.  IV Lasix 40 mg ordered and admission requested. Her weight in Sept 10 - 205 lb currently 213 lb.   Assessment/Plan Principal Problem:   Fluid overload Active Problems:   Hyponatremia   Essential hypertension   Chronic diastolic CHF (congestive heart failure) (HCC)   CKD stage 3b, GFR 30-44 ml/min (HCC)   Pericardial effusion  Acute on chronic HFpEF with exacerbation Fluid overload: Patient having leg edema also with exertional fatigue. Cardiology consulted. Continue IV diuresis. Monitor strict  I/O,daly weight, electrolytes, Cont 2 gm salt and 1500 cc fluid restricted diet.  Acute on chronic Hyponatremia: Urine na high in 80s- ?saidh, vs from fluid overload.?samsca may benefit if still low despite lasix.Await cardio to see, bmp in am  Persistent atrial fibrillation: Will resume her home Eliquis along with metoprolol.  Hypertension: BP stable continue metoprolol  History of pericardial effusion needing pericardiocentesis in 2022  GERD: Resume PPI  HLD: Resume pravastatin  Hypothyroidism: Resume Synthroid  Obesity:Body mass index is 34.48 kg/m.  Will benefit with weight loss/healthy lifestyle.  Severity of Illness: The appropriate patient status for this patient is OBSERVATION. Observation status is judged to be reasonable and necessary in order to provide the required intensity of service to ensure the patient's safety. The patient's presenting symptoms, physical exam findings, and initial radiographic and laboratory data in the context of their medical condition is felt to place them at decreased risk for further clinical deterioration. Furthermore, it is anticipated that the patient will be medically stable for discharge from the hospital within 2 midnights of admission.    DVT prophylaxis: apixaban (ELIQUIS) tablet 5 mg   Code Status:   Code Status: Full Code  Family Communication: Admission, patients condition and plan of care including tests being ordered have been discussed with the patient and HUSBAND  who indicate understanding and agree with the plan and Code Status.  Consults called:  CARDIOLOGY Dr Bjorn Pippin  Review of Systems: All systems were reviewed and were negative except as mentioned in HPI above. Negative for fever Negative for chest pain Negative for focal weakness  Past Medical History:  Diagnosis Date  Arthritis    Atrial fibrillation (HCC)    CHF (congestive heart failure) (HCC)    CKD (chronic kidney disease), stage III (HCC)    Colon  polyps    Dysrhythmia    new onset afib    Goiter    Hyperglycemia    Hyperlipidemia    Hypertension    Hypothyroidism    Morbid obesity (HCC)    Osteopenia     Past Surgical History:  Procedure Laterality Date   CARDIOVASCULAR STRESS TEST     COLONOSCOPY     IR THORACENTESIS ASP PLEURAL SPACE W/IMG GUIDE  01/26/2021   LUMBAR DISC SURGERY  2012   PERICARDIOCENTESIS N/A 01/24/2021   Procedure: PERICARDIOCENTESIS;  Surgeon: Yates Decamp, MD;  Location: [facility] INVASIVE CV LAB;  Service: Cardiovascular;  Laterality: N/A;   POLYPECTOMY     TOTAL KNEE ARTHROPLASTY Left 05/29/2017   Procedure: LEFT TOTAL KNEE ARTHROPLASTY;  Surgeon: Tarry Kos, MD;  Location: [facility] OR;  Service: Orthopedics;  Laterality: Left;   TUBAL LIGATION  1977   UPPER GASTROINTESTINAL ENDOSCOPY  12/29/2019   US ECHOCARDIOGRAPHY       reports that she has never smoked. She has never used smokeless tobacco. She reports that she does not drink alcohol and does not use drugs.  Allergies  Allergen Reactions   Atorvastatin Other (See Comments)    Aches     Family History  Problem Relation Age of Onset   CVA Mother    Heart disease Brother    Heart disease Father    Colon cancer Neg Hx    Stomach cancer Neg Hx    Colon polyps Neg Hx    Esophageal cancer Neg Hx    Rectal cancer Neg Hx      Prior to Admission medications   Medication Sig Start Date End Date Taking? Authorizing Provider  acetaminophen (TYLENOL) 325 MG tablet Take 325-650 mg by mouth at bedtime as needed for mild pain.    [provider]  Calcium Carb-Cholecalciferol (CALTRATE 600+D3 PO) Take 1 tablet by mouth every evening.    [provider]  CVS IRON 325 (65 Fe) MG tablet Take 325 mg by mouth daily at 6 PM. 01/16/21   [provider]  ELIQUIS 5 MG TABS tablet TAKE 1 TABLET BY MOUTH TWICE A DAY 07/07/23   Patwardhan, Manish J, MD  furosemide (LASIX) 20 MG tablet Take 1 tablet (20 mg total) by mouth daily. 08/06/23   Ghimire,  Werner Lean, MD  levothyroxine (SYNTHROID, LEVOTHROID) 125 MCG tablet Take 125 mcg by mouth daily before breakfast. 06/19/12   [provider]  metoprolol tartrate (LOPRESSOR) 50 MG tablet Take 1.5 tablets (75 mg total) by mouth 2 (two) times daily. 08/06/23   Ghimire, Werner Lean, MD  omeprazole (PRILOSEC) 20 MG capsule TAKE 1 CAPSULE BY MOUTH EVERY DAY 12/21/21   Hilarie Fredrickson, MD  pravastatin (PRAVACHOL) 80 MG tablet Take 80 mg by mouth in the morning. 07/16/12   [provider]  vitamin B-12 (CYANOCOBALAMIN) 1000 MCG tablet Take 1,000 mcg by mouth daily. 01/16/21   [provider]    Physical Exam: Vitals:   08/28/23 1430 08/28/23 1500 08/28/23 1637 08/28/23 1650  BP: (!) 164/87 (!) 158/99 (!) 153/106   Pulse: 75 75 80   Resp: 16  18   Temp:   (!) 97.3 F (36.3 C)   TempSrc:   Oral   SpO2: 95% 94% 96%   Weight:    96.9  kg  Height:    5\' 6"  (1.676 m)    General exam: AAOx3, ill-appearing, NAD, weak appearing. HEENT:Oral mucosa moist, Ear/Nose WNL grossly, dentition normal. Respiratory system: bilaterally diminished bs posteriorly,no wheezing or crackles,no use of accessory muscle Cardiovascular system: S1 & S2 +, No JVD,. Gastrointestinal system: Abdomen soft, NT,ND, BS+ Nervous System:Alert, awake, moving extremities and grossly nonfocal Extremities: 2+ edema in b/l ankle , distal peripheral pulses palpable.  Skin: No rashes,no icterus. MSK: Normal muscle bulk,tone, power   Labs on Admission: I have personally reviewed following labs and imaging studies  CBC: Recent Labs  Lab 08/28/23 1336  WBC 9.6  NEUTROABS 8.3*  HGB 13.9  HCT 41.6  MCV 82.2  PLT 356   Basic Metabolic Panel: Recent Labs  Lab 08/28/23 1336  NA 126*  K 3.5  CL 89*  CO2 25  GLUCOSE 113*  BUN 19  CREATININE 1.14*  CALCIUM 9.2   Estimated Creatinine Clearance: 47.7 mL/min (A) (by C-G formula based on SCr of 1.14 mg/dL (H)). Recent Labs  Lab 08/28/23 1336  AST 20  ALT 17   ALKPHOS 51  BILITOT 1.0  PROT 7.0  ALBUMIN 4.0   No results for input(s): "CHOL", "HDL", "LDLCALC", "TRIG", "CHOLHDL", "LDLDIRECT" in the last 72 hours. Thyroid Function Tests: No results for input(s): "TSH", "T4TOTAL", "FREET4", "T3FREE", "THYROIDAB" in the last 72 hours. Urine analysis:    Component Value Date/Time   COLORURINE STRAW (A) 08/28/2023 1431   APPEARANCEUR CLEAR 08/28/2023 1431   LABSPEC 1.009 08/28/2023 1431   PHURINE 6.0 08/28/2023 1431   GLUCOSEU NEGATIVE 08/28/2023 1431   HGBUR SMALL (A) 08/28/2023 1431   BILIRUBINUR NEGATIVE 08/28/2023 1431   KETONESUR 5 (A) 08/28/2023 1431   PROTEINUR 30 (A) 08/28/2023 1431   UROBILINOGEN 0.2 01/31/2011 1208   NITRITE NEGATIVE 08/28/2023 1431   LEUKOCYTESUR NEGATIVE 08/28/2023 1431    Radiological Exams on Admission: DG Chest Portable 1 View  Result Date: 08/28/2023 CLINICAL DATA:  Congestive heart failure. EXAM: PORTABLE CHEST 1 VIEW COMPARISON:  January 26, 2021. FINDINGS: Mild cardiomegaly is noted with central pulmonary vascular congestion and probable bilateral pulmonary edema. Bony thorax is unremarkable. IMPRESSION: Mild cardiomegaly with central pulmonary vascular congestion and probable bilateral pulmonary edema. Electronically Signed   By: Lupita Raider M.D.   On: 08/28/2023 16:32      Lanae Boast MD [facility]  If 7PM-7AM, please contact night-coverage [email or web address]  08/28/2023, 5:01 PM

## 2023-08-28 NOTE — ED Triage Notes (Signed)
Pt arrived POV from PCP, pt reports she was sent here d/t abnormal lab value. Pt reports visual hallucinations mostly at night, husband at bedside reports pt ams at night time and did not sleep last night. Denies chest pain, headache or any other symptoms.

## 2023-08-28 NOTE — ED Provider Triage Note (Signed)
Emergency Medicine Provider Triage Evaluation Note  [name] , a 77 y.o. female  was evaluated in triage.  Pt complains of fatigue, altered mental status, and abnormal lab value.  Patient states she had a sodium level drawn in her primary care doctor's office this morning which which 125.  She was sent here for further evaluation.  Husband is at bedside provides some of the history and states that she has been having some hallucinations at night.  She also endorses some abdominal pain which has been present for roughly 1 week.  She denies nausea, vomiting, diarrhea.  Review of Systems  Positive:  Negative: See above  Physical Exam  BP (!) 152/113 (BP Location: Right Arm)   Pulse 66   Temp 98 F (36.7 C) (Oral)   Resp 16   Ht 5\' 6"  (1.676 m)   Wt 93.2 kg   SpO2 98%   BMI 33.16 kg/m  Gen:   Awake, no distress   Resp:  Normal effort  MSK:   Moves extremities without difficulty  Other:  No abdominal tenderness.  Alert and oriented x 4.  Medical Decision Making  Medically screening exam initiated at 12:36 PM.  Appropriate orders placed.  [name] was informed that the remainder of the evaluation will be completed by another provider, this initial triage assessment does not replace that evaluation, and the importance of remaining in the ED until their evaluation is complete.     [name] [place], [place] 08/28/23 1237

## 2023-08-29 ENCOUNTER — Observation Stay (HOSPITAL_COMMUNITY): Payer: Medicare HMO

## 2023-08-29 DIAGNOSIS — N1832 Chronic kidney disease, stage 3b: Secondary | ICD-10-CM | POA: Diagnosis not present

## 2023-08-29 DIAGNOSIS — E871 Hypo-osmolality and hyponatremia: Secondary | ICD-10-CM

## 2023-08-29 DIAGNOSIS — Z7189 Other specified counseling: Secondary | ICD-10-CM | POA: Diagnosis not present

## 2023-08-29 DIAGNOSIS — I5031 Acute diastolic (congestive) heart failure: Secondary | ICD-10-CM

## 2023-08-29 DIAGNOSIS — I5032 Chronic diastolic (congestive) heart failure: Secondary | ICD-10-CM | POA: Diagnosis not present

## 2023-08-29 DIAGNOSIS — I5033 Acute on chronic diastolic (congestive) heart failure: Secondary | ICD-10-CM

## 2023-08-29 LAB — BASIC METABOLIC PANEL
Anion gap: 9 (ref 5–15)
BUN: 20 mg/dL (ref 8–23)
CO2: 28 mmol/L (ref 22–32)
Calcium: 8.5 mg/dL — ABNORMAL LOW (ref 8.9–10.3)
Chloride: 89 mmol/L — ABNORMAL LOW (ref 98–111)
Creatinine, Ser: 1.17 mg/dL — ABNORMAL HIGH (ref 0.44–1.00)
GFR, Estimated: 48 mL/min — ABNORMAL LOW (ref >60)
Glucose, Bld: 97 mg/dL (ref 70–99)
Potassium: 3.4 mmol/L — ABNORMAL LOW (ref 3.5–5.1)
Sodium: 126 mmol/L — ABNORMAL LOW (ref 135–145)

## 2023-08-29 LAB — CBC
HCT: 39.8 % (ref 36.0–46.0)
Hemoglobin: 13.1 g/dL (ref 12.0–15.0)
MCH: 27.4 pg (ref 26.0–34.0)
MCHC: 32.9 g/dL (ref 30.0–36.0)
MCV: 83.3 fL (ref 80.0–100.0)
Platelets: 269 10*3/uL (ref 150–400)
RBC: 4.78 MIL/uL (ref 3.87–5.11)
RDW: 14 % (ref 11.5–15.5)
WBC: 9.4 10*3/uL (ref 4.0–10.5)
nRBC: 0 % (ref 0.0–0.2)

## 2023-08-29 LAB — C DIFFICILE QUICK SCREEN W PCR REFLEX
C Diff antigen: NEGATIVE
C Diff interpretation: NOT DETECTED
C Diff toxin: NEGATIVE

## 2023-08-29 LAB — MAGNESIUM: Magnesium: 1.8 mg/dL (ref 1.7–2.4)

## 2023-08-29 MED ORDER — FUROSEMIDE 10 MG/ML IJ SOLN
40.0000 mg | Freq: Two times a day (BID) | INTRAMUSCULAR | Status: DC
  Administered 2023-08-29 – 2023-08-30 (×2): 40 mg via INTRAVENOUS
  Filled 2023-08-29 (×2): qty 4

## 2023-08-29 MED ORDER — POTASSIUM CHLORIDE CRYS ER 20 MEQ PO TBCR
40.0000 meq | EXTENDED_RELEASE_TABLET | Freq: Two times a day (BID) | ORAL | Status: DC
  Administered 2023-08-29 – 2023-08-30 (×3): 40 meq via ORAL
  Filled 2023-08-29 (×3): qty 2

## 2023-08-29 NOTE — Progress Notes (Signed)
Progress Note   Patient: [name]:[id number] DOB: 03/12/1946 DOA: 08/28/2023     0 DOS: the patient was seen and examined on 08/29/2023   Brief hospital course: 77yo with h/o HTN, afib-on Eliquis, chronic HFpEF, pericardial effusion s/p pericardiocentesis 01/2021, CKD stage IIIb, HTN, GERD, and class 1 obesity who was recently hospitalized in September for severe hyponatremia (from combination of excessive free water intake, Aldactone), Na++ 127 at dc, was sent on 9/26 from PCP office due to abnormal labs.  + nocturnal hallucinations, some abdominal pain, LE edema, orthopnea. She was admitted for acute on chronic diastolic CHF and treated with Lasix.  Cardiology consulted.  Assessment and Plan:   Acute on chronic HFpEF Patient presented with LE edema and exertional fatigue She was found to have elevated BNP In conjunction with hyponatremia (see below), this was concerning for hypervolemia associated with diastolic CHF exacerbation She feels significantly better today - edema has resolved, she is on RA, and is able to ambulate up and down the halls without difficulty Discharge considered but cardiology prefers additional diuretics and an updated echo Discussed with patient, who is ok with staying an additional night   Chronic Hyponatremia On review of labs, Na++ was 130-131 in 06/2021  She appears to be at/near current baseline of 126-127, stable Will trend for an additional day with diuresis   Persistent atrial fibrillation Rate controlled with metoprolol Continue Eliquis   Hypertension BP stable cC   History of pericardial effusion  S/p pericardiocentesis in 2022   GERD Resume PPI   HLD Resume pravastatin   Hypothyroidism Resume Synthroid  Diarrhea Resolved prior to hospitalization, per patient Negative C diff testing   Obesity Body mass index is 34.23 kg/m.Marland Kitchen  Weight loss should be encouraged Outpatient PCP/bariatric medicine f/u encouraged        Consultants: Cardiology Palliative care [facility] team   Procedures: Echocardiogram   Antibiotics: None  30 Day Unplanned Readmission Risk Score    Flowsheet Row ED to Hosp-Admission (Discharged) from 08/01/2023 in [place] [facility]  30 Day Unplanned Readmission Risk Score (%) 12.53 Filed at 08/06/2023 0801       This score is the patient's risk of an unplanned readmission within 30 days of being discharged (0 -100%). The score is based on dignosis, age, lab data, medications, orders, and past utilization.   Low:  0-14.9   Medium: 15-21.9   High: 22-29.9   Extreme: 30 and above           Subjective: Feeling better today.  No diarrhea in several days   Objective: Vitals:   08/29/23 0620 08/29/23 1221  BP: (!) 153/81 (!) 124/92  Pulse: 71 78  Resp: 16 16  Temp: 97.6 F (36.4 C) 97.9 F (36.6 C)  SpO2: 96% 98%    Intake/Output Summary (Last 24 hours) at 08/29/2023 1429 Last data filed at 08/29/2023 1413 Gross per 24 hour  Intake 720 ml  Output 1950 ml  Net -1230 ml   Filed Weights   08/28/23 1135 08/28/23 1650 08/29/23 0526  Weight: 93.2 kg 96.9 kg 96.2 kg    Exam:  General:  Appears calm and comfortable and is in NAD Eyes:  PERRL, EOMI, normal lids, iris ENT:  grossly normal hearing, lips & tongue, mmm Neck:  no LAD, masses or thyromegaly Cardiovascular:  RRR, no m/r/g. Trace LE edema.  Respiratory:   CTA bilaterally with no wheezes/rales/rhonchi.  Normal respiratory effort. Abdomen:  soft, NT, ND Skin:  no rash or induration seen on limited exam Musculoskeletal:  grossly normal tone BUE/BLE, good ROM, no bony abnormality Psychiatric:  grossly normal mood and affect, speech fluent and appropriate, AOx3 Neurologic:  CN 2-12 grossly intact, moves all extremities in coordinated fashion  Data Reviewed: I have reviewed the patient's lab results since admission.  Pertinent labs for today include:   Na++ 126 - stable Stable  BUN/creatinine Normal CBC     Family Communication: Husband was present throughout evaluation  Disposition: Status is: Observation The patient remains OBS appropriate and will d/c before 2 midnights.     Time spent: 50 minutes  Unresulted Labs (From admission, onward)     Start     Ordered   08/30/23 0500  CBC with Differential/Platelet  Tomorrow morning,   R       Question:  Specimen collection method  Answer:  Lab=Lab collect   08/29/23 1429   08/30/23 0500  Basic metabolic panel  Tomorrow morning,   R       Question:  Specimen collection method  Answer:  Lab=Lab collect   08/29/23 1429             Author: Jonah Blue, MD 08/29/2023 2:29 PM  For on call review [email or web address].

## 2023-08-29 NOTE — Consult Note (Signed)
Cardiology Consultation   Patient ID: [name] MRN: [id number]; DOB: 1946-01-02  Admit date: 08/28/2023 Date of Consult: 08/29/2023  PCP:  Patricia Corn, MD   [facility] Providers Cardiologist:  Patricia Negus, MD   {    Patient Profile:   [name] is a 77 y.o. female with a history of chronic HFpEF, persistent atrial fibrillation on Eliquis, pericardial effusion s/p pericardiocentesis in 01/2021, hypertension, hyperlipidemia, hypothyroidism, CKD stage III, and morbid obesity who is being seen 08/29/2023 for the evaluation of CHF at the request of [name].  History of Present Illness:   [name] is a 77 year old female with the above history who is followed by [name].  Last Echo in 01/2021 showed EF of 50-55% with normal wall motion, mild AS, and mild TR.  Recently admitted from 08/01/2023 to 08/06/2019 for for hyponatremia with sodium as low as 120.  This is felt to be due to a combination of excessive free water intake and recent diuretic use.  She initially responded to IV fluids but then sodium levels dropped again.  Therefore she was treated with Lasix and fluid restriction with gradual improvement in sodium of 147 at discharge.  Spironolactone was stopped during admission as this was felt to be contributing and amlodipine was stopped as well as this was felt to possibly be contributing to her lower extremity edema which is the reason she was started on the diuretics.  She was seen by [name] for follow-up on 08/12/2023 at which time she was doing well.  She was advised to use Lasix only as needed.  Patient presented back to the ED on 08/28/2019 for per PCP for recurrent hyponatremia with sodium level of 125 at PCPs office. She reports having orthopnea and PND for the last few days. She denies any other shortness of breath and her lower extremity edema has been stable. No chest pain. No palpitations, lightheadedness, dizziness, or syncope. She has had a  productive cough with clear phlegm for a while as well as some nausea but no there recent fevers or illnesses. No vomiting. No abnormal bleeding in urine or stools. Patient does states she has had some confusion over the last few days and husband reports she was having some visual hallucinations.  Upon arrival to the ED, patient was mildly hypertensive but vitals stable. WBC 9.6, Hgb 13.9, Plts 356. Na 126, K 3.5, Glucose 113, BUN 19, Cr 1.14, LFTs normal. BNP elevated at 1,037. Chest x-ray showed mild cardiomegaly with central pulmonary vascular congestion and probable bilateral pulmonary edema. Urine sodium 84 and urine osmolality 363. Serum osmolality 273. Hyponatremia was felt to possibly be due to volume overload and she was started on IV Lasix and admitted. Cardiology consulted for further evaluation.   Past Medical History:  Diagnosis Date   Arthritis    Atrial fibrillation (HCC)    CHF (congestive heart failure) (HCC)    CKD (chronic kidney disease), stage III (HCC)    Colon polyps    Dysrhythmia    new onset afib    Goiter    Hyperglycemia    Hyperlipidemia    Hypertension    Hypothyroidism    Morbid obesity (HCC)    Osteopenia     Past Surgical History:  Procedure Laterality Date   CARDIOVASCULAR STRESS TEST     COLONOSCOPY     IR THORACENTESIS ASP PLEURAL SPACE W/IMG GUIDE  01/26/2021   LUMBAR DISC SURGERY  2012  PERICARDIOCENTESIS N/A 01/24/2021   Procedure: PERICARDIOCENTESIS;  Surgeon: Yates Decamp, MD;  Location: [facility] INVASIVE CV LAB;  Service: Cardiovascular;  Laterality: N/A;   POLYPECTOMY     TOTAL KNEE ARTHROPLASTY Left 05/29/2017   Procedure: LEFT TOTAL KNEE ARTHROPLASTY;  Surgeon: Tarry Kos, MD;  Location: [facility] OR;  Service: Orthopedics;  Laterality: Left;   TUBAL LIGATION  1977   UPPER GASTROINTESTINAL ENDOSCOPY  12/29/2019   US ECHOCARDIOGRAPHY         Inpatient Medications: Scheduled Meds:  apixaban  5 mg Oral BID   cyanocobalamin  1,000 mcg Oral Daily    ferrous sulfate  325 mg Oral q1800   furosemide  40 mg Intravenous BID   influenza vaccine adjuvanted  0.5 mL Intramuscular Tomorrow-1000   levothyroxine  125 mcg Oral Q0600   metoprolol tartrate  75 mg Oral BID   pantoprazole  40 mg Oral Daily   pravastatin  80 mg Oral Daily   Continuous Infusions:  PRN Meds: acetaminophen **OR** acetaminophen  Allergies:    Allergies  Allergen Reactions   Spironolactone Other (See Comments)    Hyponatremia-  the level of sodium in blood is too high   Atorvastatin Other (See Comments)    "Aches"     Social History:   Social History   Socioeconomic History   Marital status: Married    Spouse name: Not on file   Number of children: 2   Years of education: Not on file   Highest education level: Not on file  Occupational History   Occupation: retired  Tobacco Use   Smoking status: Never   Smokeless tobacco: Never  Vaping Use   Vaping status: Never Used  Substance and Sexual Activity   Alcohol use: No   Drug use: No   Sexual activity: Not on file  Other Topics Concern   Not on file  Social History Narrative   Not on file   Social Determinants of Health   Financial Resource Strain: Not on file  Food Insecurity: No Food Insecurity (08/28/2023)   Hunger Vital Sign    Worried About Running Out of Food in the Last Year: Never true    Ran Out of Food in the Last Year: Never true  Transportation Needs: No Transportation Needs (08/28/2023)   PRAPARE - Administrator, Civil Service (Medical): No    Lack of Transportation (Non-Medical): No  Physical Activity: Not on file  Stress: Not on file  Social Connections: Not on file  Intimate Partner Violence: Not At Risk (08/28/2023)   Humiliation, Afraid, Rape, and Kick questionnaire    Fear of Current or Ex-Partner: No    Emotionally Abused: No    Physically Abused: No    Sexually Abused: No    Family History:   Family History  Problem Relation Age of Onset   CVA Mother     Heart disease Brother    Heart disease Father    Colon cancer Neg Hx    Stomach cancer Neg Hx    Colon polyps Neg Hx    Esophageal cancer Neg Hx    Rectal cancer Neg Hx      ROS:  Please see the history of present illness.  All other ROS reviewed and negative.     Physical Exam/Data:   Vitals:   08/29/23 0035 08/29/23 0526 08/29/23 0620 08/29/23 1221  BP: (!) 151/110  (!) 153/81 (!) 124/92  Pulse: (!) 55  71 78  Resp: 16  16 16  Temp: (!) 97.4 F (36.3 C)  97.6 F (36.4 C) 97.9 F (36.6 C)  TempSrc: Oral     SpO2: 97%  96% 98%  Weight:  96.2 kg    Height:        Intake/Output Summary (Last 24 hours) at 08/29/2023 1247 Last data filed at 08/29/2023 1006 Gross per 24 hour  Intake 720 ml  Output 1650 ml  Net -930 ml      08/29/2023    5:26 AM 08/28/2023    4:50 PM 08/28/2023   11:35 AM  Last 3 Weights  Weight (lbs) 212 lb 1.3 oz 213 lb 10 oz 205 lb 7.5 oz  Weight (kg) 96.2 kg 96.9 kg 93.2 kg     Body mass index is 34.23 kg/m.  General: 77 y.o. Caucasian female resting comfortably in no acute distress. HEENT: Normocephalic and atraumatic. Sclera clear.  Neck: Supple. No significant JVD with patient sitting completely upright. Heart: Irregularly irregular rhythm with normal rate.  Radial pulses 2+ and equal bilaterally. Lungs: No increased work of breathing. No significant crackles, wheezes, or rhonchi appreciated.  Abdomen: Soft, non-distended, and non-tender to palpation. Extremities: Trace lower extremity edema (left leg chronically larger than the right).   Skin: Warm and dry. Neuro: Alert and oriented x3. No focal deficits. Psych: Normal affect. Responds appropriately.   EKG:  No EKG has been completed this admisison. Telemetry:  Telemetry was personally reviewed and demonstrates:  Atrial fibrillation with rates ranging from the 50s to 80s (mostly in the 60-70s). Occasional pauses (longest pause 2.28 seconds).  Relevant CV Studies:  Echocardiogram  02/07/2021: Left ventricle cavity is normal in size and wall thickness. Normal LV  systolic function with visual EF 50-55%. Normal global wall motion. Unable  to evaluate diastolic function due to atrial fibrillation.  Left atrial cavity is moderately dilated.  Structurally normal trileaflet aortic valve. Mild aortic valve stenosis.  Vmax 1.8 m/sec, mean PG 9 mmHg, AVA 1.1 cm2 by continuity equation  Mild tricuspid regurgitation.  No evidence of pulmonary hypertension.   Laboratory Data:  High Sensitivity Troponin:  No results for input(s): "TROPONINIHS" in the last 720 hours.   Chemistry Recent Labs  Lab 08/28/23 1336 08/29/23 0526  NA 126* 126*  K 3.5 3.4*  CL 89* 89*  CO2 25 28  GLUCOSE 113* 97  BUN 19 20  CREATININE 1.14* 1.17*  CALCIUM 9.2 8.5*  MG  --  1.8  GFRNONAA 49* 48*  ANIONGAP 12 9    Recent Labs  Lab 08/28/23 1336  PROT 7.0  ALBUMIN 4.0  AST 20  ALT 17  ALKPHOS 51  BILITOT 1.0   Lipids No results for input(s): "CHOL", "TRIG", "HDL", "LABVLDL", "LDLCALC", "CHOLHDL" in the last 168 hours.  Hematology Recent Labs  Lab 08/28/23 1336 08/29/23 0526  WBC 9.6 9.4  RBC 5.06 4.78  HGB 13.9 13.1  HCT 41.6 39.8  MCV 82.2 83.3  MCH 27.5 27.4  MCHC 33.4 32.9  RDW 14.2 14.0  PLT 356 269   Thyroid No results for input(s): "TSH", "FREET4" in the last 168 hours.  BNP Recent Labs  Lab 08/28/23 1336  BNP 1,037.6*    DDimer No results for input(s): "DDIMER" in the last 168 hours.   Radiology/Studies:  DG Chest Portable 1 View  Result Date: 08/28/2023 CLINICAL DATA:  Congestive heart failure. EXAM: PORTABLE CHEST 1 VIEW COMPARISON:  January 26, 2021. FINDINGS: Mild cardiomegaly is noted with central pulmonary vascular congestion and probable  bilateral pulmonary edema. Bony thorax is unremarkable. IMPRESSION: Mild cardiomegaly with central pulmonary vascular congestion and probable bilateral pulmonary edema. Electronically Signed   By: Lupita Raider M.D.    On: 08/28/2023 16:32     Assessment and Plan:   Acute on Chronic HFpEF Patient presented with hyponatremia from her PCP's office with sodium of 125. However, she also reports orthopnea and PND for the past few days. BNP elevated at 1,037 and chest x-ray shows findings consistent with CHF. Hyponatremia felt to be due to hypervolemia. She was started on IV Lasix with good urine output.  - She does not appear significantly volume overloaded on exam. Sodium level still low at 126. - Will repeat Echo since she has not had one since 2022. - Will increase IV Lasix to 40mg  twice daily.  Permanent Atria Fibrillation Rate controlled. Telemetry shows occasional brief pause (longest one 2.28 seconds). - Continue Lopressor 75mg  twice daily. - Continue Eliquis 5mg  twice daily.  Hypertension BP mildly elevated. - Continue Lopressor 75mg  twice daily.  - Previously on Amlodipine but this was stopped during last admission due to lower extremity edema. - Continue to monitor. May need to adjust medications. Could consider switching from Lopressor to Coreg or could add an ARB.  CKD Stage III Creatinine 1.17 today which appear around her baseline.  Hyponatremia Sodium 126 on admission. She reports some confusion and visual hallucinations with this. Suspect secondary to hypervolemia. - Sodium unchanged at 126 today. - Continue diuresis as above. - If no improvement with diuresis, would recommended consulting Nephrology.  Otherwise, per primary team: - Hypothyroidism  - Hyperlipidemia - GERD   Risk Assessment/Risk Scores:    New York Heart Association (NYHA) Functional Class NYHA Class II  CHA2DS2-VASc Score = 5  This indicates a 7.2% annual risk of stroke. The patient's score is based upon: CHF History: 1 HTN History: 1 Diabetes History: 0 Stroke History: 0 Vascular Disease History: 0 Age Score: 2 Gender Score: 1     For questions or updates, please contact [facility] Please consult [email or web address] for contact info under    Signed, Corrin Parker, PA-C  08/29/2023 12:47 PM

## 2023-08-29 NOTE — Plan of Care (Signed)
Pt is progressing 

## 2023-08-29 NOTE — Plan of Care (Signed)
  Problem: Education: Goal: Knowledge of General Education information will improve Description: Including pain rating scale, medication(s)/side effects and non-pharmacologic comfort measures Outcome: Progressing   Problem: Health Behavior/Discharge Planning: Goal: Ability to manage health-related needs will improve Outcome: Progressing   Problem: Clinical Measurements: Goal: Ability to maintain clinical measurements within normal limits will improve Outcome: Progressing Goal: Will remain free from infection Outcome: Progressing Goal: Diagnostic test results will improve Outcome: Progressing Goal: Respiratory complications will improve Outcome: Progressing Goal: Cardiovascular complication will be avoided Outcome: Progressing   Problem: Activity: Goal: Risk for activity intolerance will decrease Outcome: Progressing   Problem: Nutrition: Goal: Adequate nutrition will be maintained Outcome: Progressing   Problem: Coping: Goal: Level of anxiety will decrease Outcome: Progressing   Problem: Elimination: Goal: Will not experience complications related to bowel motility Outcome: Progressing Goal: Will not experience complications related to urinary retention Outcome: Progressing   Problem: Pain Managment: Goal: General experience of comfort will improve Outcome: Progressing   Problem: Safety: Goal: Ability to remain free from injury will improve Outcome: Progressing   Problem: Skin Integrity: Goal: Risk for impaired skin integrity will decrease Outcome: Progressing   Problem: Education: Goal: Ability to demonstrate management of disease process will improve Outcome: Progressing Goal: Ability to verbalize understanding of medication therapies will improve Outcome: Progressing Goal: Individualized Educational Video(s) Outcome: Progressing   Problem: Activity: Goal: Capacity to carry out activities will improve Outcome: Progressing   Problem: Cardiac: Goal:  Ability to achieve and maintain adequate cardiopulmonary perfusion will improve Outcome: Progressing   Problem: Education: Goal: Ability to demonstrate management of disease process will improve Outcome: Progressing Goal: Ability to verbalize understanding of medication therapies will improve Outcome: Progressing Goal: Individualized Educational Video(s) Outcome: Progressing   Problem: Activity: Goal: Capacity to carry out activities will improve Outcome: Progressing   Problem: Cardiac: Goal: Ability to achieve and maintain adequate cardiopulmonary perfusion will improve Outcome: Progressing   Problem: Education: Goal: Knowledge of General Education information will improve Description: Including pain rating scale, medication(s)/side effects and non-pharmacologic comfort measures Outcome: Progressing   Problem: Clinical Measurements: Goal: Ability to maintain clinical measurements within normal limits will improve Outcome: Progressing Goal: Will remain free from infection Outcome: Progressing Goal: Diagnostic test results will improve Outcome: Progressing Goal: Respiratory complications will improve Outcome: Progressing Goal: Cardiovascular complication will be avoided Outcome: Progressing   Problem: Pain Managment: Goal: General experience of comfort will improve Outcome: Progressing   Problem: Safety: Goal: Ability to remain free from injury will improve Outcome: Progressing   Problem: Skin Integrity: Goal: Risk for impaired skin integrity will decrease Outcome: Progressing

## 2023-08-29 NOTE — Progress Notes (Signed)
   08/29/23 1534  TOC Brief Assessment  Insurance and Status Reviewed  Patient has primary care physician Yes  Home environment has been reviewed Home w/ spouse  Prior level of function: Indpendent/modified independent  Prior/Current Home Services No current home services  Social Determinants of Health Reivew SDOH reviewed no interventions necessary  Readmission risk has been reviewed Yes  Transition of care needs no transition of care needs at this time

## 2023-08-29 NOTE — Progress Notes (Signed)
Mobility Specialist - Progress Note   08/29/23 1011  Mobility  Activity Ambulated with assistance in hallway  Level of Assistance Standby assist, set-up cues, supervision of patient - no hands on  Assistive Device Front wheel walker  Distance Ambulated (ft) 285 ft  Activity Response Tolerated well  Mobility Referral Yes  $Mobility charge 1 Mobility  Mobility Specialist Start Time (ACUTE ONLY) [id number]  Mobility Specialist Stop Time (ACUTE ONLY) 1010  Mobility Specialist Time Calculation (min) (ACUTE ONLY) 14 min   Pt received in bed and agreeable to mobility. Pt was minA from STS. No complaints during session. Pt to EOB after session with all needs met & husband in room.   [facility]

## 2023-08-29 NOTE — Consult Note (Signed)
Consultation Note Date: 08/29/2023   Patient Name: [name]  DOB: May 29, 1946  MRN: [id number]  Age / Sex: 77 y.o., female  PCP: Creola Corn, MD Referring Physician: Lanae Boast, MD   Reason for Consultation:  goals of care  HPI/Patient Profile: 77 y.o. female  with past medical history of chronic hyponatremia, CHF, HTN, CKD IIIb, GERD  admitted on 08/28/2023 with severe hyponatremia and acute on chronic HF exacerbation. She has diuresed well. Palliative consulted for goals of care.    Primary Decision Maker PATIENT - surrogate is spouse if she is unable  Discussion: I have reviewed medical records including Care Everywhere, progress notes from this and prior admissions, labs and imaging, discussed with RN.  On evaluation patient is awake, alert, oriented.   I introduced Palliative Medicine as specialized medical care for people living with serious illness. It focuses on providing relief from the symptoms and stress of a serious illness. The goal is to improve quality of life for both the patient and the family.  As far as functional and nutritional status - she lives at home, independent with ADL's. She is not SOB at baseline, but occasionally with heart failure exacerbation.  We discussed patient's current illness and what it means in the larger context of patient's on-going co-morbidities.  Natural disease trajectory and expectations at EOL were discussed.  Her current goals of care are to stabilize and be discharged home. She was just telling her husband they need to plan a trip somewhere- but she was   Advance directives were discussed. She has a living will. There is not a copy on her chart. I encouraged her to share a copy with her providers and to bring a copy with her to the hospital so it can be added to her chart.   Discussed with patient/family the importance of continued conversation with  family and the medical providers regarding overall plan of care and treatment options, ensuring decisions are within the context of the patient's values and GOCs.   We discussed code status. She requests full code for now. Discussed that  DNR/DNI status may be appropriate understanding evidenced based poor outcomes in similar hospitalized patients, as the cause of the arrest is likely associated with chronic/terminal disease rather than a reversible acute cardio-pulmonary event.   Questions and concerns were addressed. The family was encouraged to call with questions or concerns.    SUMMARY OF RECOMMENDATIONS -Continue current plan of care -Likely discharge tomorrow per attending -PMT will follow peripherally and see if needs arise    Code Status/Advance Care Planning: Full code   Prognosis:   Unable to determine  Discharge Planning:  Home  Primary Diagnoses: Present on Admission:  Hyponatremia  Essential hypertension  Chronic diastolic CHF (congestive heart failure) (HCC)  CKD stage 3b, GFR 30-44 ml/min (HCC)  Pericardial effusion  Fluid overload   Review of Systems  Respiratory:  Positive for shortness of breath. Negative for cough.   Cardiovascular:  Positive for leg swelling.    Physical Exam  Vitals and nursing note reviewed.  Pulmonary:     Effort: Pulmonary effort is normal.  Skin:    General: Skin is warm and dry.  Neurological:     Mental Status: She is alert and oriented to person, place, and time.     Vital Signs: BP (!) 124/92 (BP Location: Right Arm)   Pulse 78   Temp 97.9 F (36.6 C)   Resp 16   Ht 5\' 6"  (1.676 m)   Wt 96.2 kg   SpO2 98%   BMI 34.23 kg/m  Pain Scale: 0-10   Pain Score: 0-No pain   SpO2: SpO2: 98 % O2 Device:SpO2: 98 % O2 Flow Rate: .   IO: Intake/output summary:  Intake/Output Summary (Last 24 hours) at 08/29/2023 1554 Last data filed at 08/29/2023 1413 Gross per 24 hour  Intake 720 ml  Output 1950 ml  Net -1230 ml     LBM: Last BM Date : 08/28/23 Baseline Weight: Weight: 93.2 kg Most recent weight: Weight: 96.2 kg       Thank you for this consult. Palliative medicine will continue to follow and assist as needed.   Greater than 50%  of this time was spent counseling and coordinating care related to the above assessment and plan.  Signed by: Ocie Bob, AGNP-C Palliative Medicine    Please contact Palliative Medicine Team phone at [phone] for questions and concerns.  For individual provider: See Loretha Stapler

## 2023-08-30 ENCOUNTER — Observation Stay (HOSPITAL_BASED_OUTPATIENT_CLINIC_OR_DEPARTMENT_OTHER): Payer: Medicare HMO

## 2023-08-30 DIAGNOSIS — I5033 Acute on chronic diastolic (congestive) heart failure: Secondary | ICD-10-CM | POA: Diagnosis not present

## 2023-08-30 DIAGNOSIS — N179 Acute kidney failure, unspecified: Secondary | ICD-10-CM

## 2023-08-30 DIAGNOSIS — I5031 Acute diastolic (congestive) heart failure: Secondary | ICD-10-CM | POA: Diagnosis not present

## 2023-08-30 DIAGNOSIS — E871 Hypo-osmolality and hyponatremia: Secondary | ICD-10-CM | POA: Diagnosis not present

## 2023-08-30 LAB — ECHOCARDIOGRAM COMPLETE
AR max vel: 0.57 cm2
AV Area VTI: 0.48 cm2
AV Area mean vel: 0.52 cm2
AV Mean grad: 11.7 mm[Hg]
AV Peak grad: 20.9 mm[Hg]
Ao pk vel: 2.29 m/s
Area-P 1/2: 7.99 cm2
Height: 66 in
MV M vel: 3.36 m/s
MV Peak grad: 45.2 mm[Hg]
P 1/2 time: 604 ms
S' Lateral: 2.65 cm
Weight: 3425.07 [oz_av]

## 2023-08-30 LAB — CBC WITH DIFFERENTIAL/PLATELET
Abs Immature Granulocytes: 0.02 10*3/uL (ref 0.00–0.07)
Basophils Absolute: 0.1 10*3/uL (ref 0.0–0.1)
Basophils Relative: 1 %
Eosinophils Absolute: 0.1 10*3/uL (ref 0.0–0.5)
Eosinophils Relative: 1 %
HCT: 43.3 % (ref 36.0–46.0)
Hemoglobin: 13.9 g/dL (ref 12.0–15.0)
Immature Granulocytes: 0 %
Lymphocytes Relative: 8 %
Lymphs Abs: 0.7 10*3/uL (ref 0.7–4.0)
MCH: 27 pg (ref 26.0–34.0)
MCHC: 32.1 g/dL (ref 30.0–36.0)
MCV: 84.1 fL (ref 80.0–100.0)
Monocytes Absolute: 0.8 10*3/uL (ref 0.1–1.0)
Monocytes Relative: 9 %
Neutro Abs: 7.5 10*3/uL (ref 1.7–7.7)
Neutrophils Relative %: 81 %
Platelets: 327 10*3/uL (ref 150–400)
RBC: 5.15 MIL/uL — ABNORMAL HIGH (ref 3.87–5.11)
RDW: 14.5 % (ref 11.5–15.5)
WBC: 9.2 10*3/uL (ref 4.0–10.5)
nRBC: 0 % (ref 0.0–0.2)

## 2023-08-30 LAB — BASIC METABOLIC PANEL
Anion gap: 10 (ref 5–15)
BUN: 25 mg/dL — ABNORMAL HIGH (ref 8–23)
CO2: 29 mmol/L (ref 22–32)
Calcium: 8.8 mg/dL — ABNORMAL LOW (ref 8.9–10.3)
Chloride: 90 mmol/L — ABNORMAL LOW (ref 98–111)
Creatinine, Ser: 1.48 mg/dL — ABNORMAL HIGH (ref 0.44–1.00)
GFR, Estimated: 36 mL/min — ABNORMAL LOW (ref >60)
Glucose, Bld: 102 mg/dL — ABNORMAL HIGH (ref 70–99)
Potassium: 4.8 mmol/L (ref 3.5–5.1)
Sodium: 129 mmol/L — ABNORMAL LOW (ref 135–145)

## 2023-08-30 MED ORDER — HEPARIN (PORCINE) 25000 UT/250ML-% IV SOLN
1100.0000 [IU]/h | INTRAVENOUS | Status: DC
  Administered 2023-08-31: 1200 [IU]/h via INTRAVENOUS
  Filled 2023-08-30: qty 250

## 2023-08-30 MED ORDER — FUROSEMIDE 40 MG PO TABS: 40.0000 mg | ORAL_TABLET | Freq: Every day | ORAL | Status: DC

## 2023-08-30 NOTE — Progress Notes (Signed)
Rounding Note    Patient Name: [name] Date of Encounter: 08/30/2023   [facility] Cardiologist: Elder Negus, MD   Subjective   BP 158/86. Net negative 1.6L yesterday, negative 2.8L on admission.  Bump in Cr (1.17>1.48), sodium improved (126>129).  Denies any dyspnea  Inpatient Medications    Scheduled Meds:  apixaban  5 mg Oral BID   cyanocobalamin  1,000 mcg Oral Daily   ferrous sulfate  325 mg Oral q1800   furosemide  40 mg Intravenous BID   levothyroxine  125 mcg Oral Q0600   metoprolol tartrate  75 mg Oral BID   pantoprazole  40 mg Oral Daily   potassium chloride  40 mEq Oral BID   pravastatin  80 mg Oral Daily   Continuous Infusions:  PRN Meds: acetaminophen **OR** acetaminophen   Vital Signs    Vitals:   08/29/23 1221 08/29/23 1920 08/30/23 0425 08/30/23 0700  BP: (!) 124/92 (!) 151/82 (!) 158/86   Pulse: 78 (!) 106 73   Resp: 16 16 18    Temp: 97.9 F (36.6 C) (!) 97.4 F (36.3 C) 97.6 F (36.4 C)   TempSrc:   Oral   SpO2: 98% 97% 97%   Weight:    97.1 kg  Height:        Intake/Output Summary (Last 24 hours) at 08/30/2023 1048 Last data filed at 08/30/2023 0915 Gross per 24 hour  Intake 590 ml  Output 2100 ml  Net -1510 ml      08/30/2023    7:00 AM 08/29/2023    5:26 AM 08/28/2023    4:50 PM  Last 3 Weights  Weight (lbs) 214 lb 1.1 oz 212 lb 1.3 oz 213 lb 10 oz  Weight (kg) 97.1 kg 96.2 kg 96.9 kg      Telemetry    Afib rates 70-100s - Personally Reviewed  ECG    NO new ECG - Personally Reviewed  Physical Exam   GEN: No acute distress.   Neck: No JVD Cardiac: irregular, normal rate, no murmurs, rubs, or gallops.  Respiratory: Clear to auscultation bilaterally. GI: Soft, nontender, non-distended  MS: No edema; No deformity. Neuro:  Nonfocal  Psych: Normal affect   Labs    High Sensitivity Troponin:  No results for input(s): "TROPONINIHS" in the last 720 hours.   Chemistry Recent Labs  Lab  08/28/23 1336 08/29/23 0526 08/30/23 0612  NA 126* 126* 129*  K 3.5 3.4* 4.8  CL 89* 89* 90*  CO2 25 28 29   GLUCOSE 113* 97 102*  BUN 19 20 25*  CREATININE 1.14* 1.17* 1.48*  CALCIUM 9.2 8.5* 8.8*  MG  --  1.8  --   PROT 7.0  --   --   ALBUMIN 4.0  --   --   AST 20  --   --   ALT 17  --   --   ALKPHOS 51  --   --   BILITOT 1.0  --   --   GFRNONAA 49* 48* 36*  ANIONGAP 12 9 10     Lipids No results for input(s): "CHOL", "TRIG", "HDL", "LABVLDL", "LDLCALC", "CHOLHDL" in the last 168 hours.  Hematology Recent Labs  Lab 08/28/23 1336 08/29/23 0526 08/30/23 0612  WBC 9.6 9.4 9.2  RBC 5.06 4.78 5.15*  HGB 13.9 13.1 13.9  HCT 41.6 39.8 43.3  MCV 82.2 83.3 84.1  MCH 27.5 27.4 27.0  MCHC 33.4 32.9 32.1  RDW 14.2 14.0 14.5  PLT  356 269 327   Thyroid No results for input(s): "TSH", "FREET4" in the last 168 hours.  BNP Recent Labs  Lab 08/28/23 1336  BNP 1,037.6*    DDimer No results for input(s): "DDIMER" in the last 168 hours.   Radiology    DG Chest Portable 1 View  Result Date: 08/28/2023 CLINICAL DATA:  Congestive heart failure. EXAM: PORTABLE CHEST 1 VIEW COMPARISON:  January 26, 2021. FINDINGS: Mild cardiomegaly is noted with central pulmonary vascular congestion and probable bilateral pulmonary edema. Bony thorax is unremarkable. IMPRESSION: Mild cardiomegaly with central pulmonary vascular congestion and probable bilateral pulmonary edema. Electronically Signed   By: Lupita Raider M.D.   On: 08/28/2023 16:32    Cardiac Studies     Patient Profile     77 y.o. female with history of diastolic heart failure, permanent atrial fibrillation on Eliquis, pericardial effusion status post pericardiocentesis in 2022, hypertension, CKD stage IIIa admitted on 08/28/2023 for acute on chronic diastolic heart failure.  Course complicated by hypovolemic hyponatremia.  Assessment & Plan    Acute on Chronic HFpEF Patient presented with hyponatremia from her PCP's office  with sodium of 125. However, she also reports orthopnea and PND for the past few days. BNP elevated at 1,037 and chest x-ray shows findings consistent with CHF. Hyponatremia felt to be due to hypervolemia. She was started on IV Lasix with good urine output.  - She does not appear volume overloaded on exam. Sodium improved at 129.  She received IV lasix this morning, will hold further IV lasix and transition to PO lasix tomorrow - Will repeat Echo since she has not had one since 2022.   Permanent Atria Fibrillation Rate controlled. Telemetry shows occasional brief pause (longest one 2.28 seconds). - Continue Lopressor 75mg  twice daily. - Continue Eliquis 5mg  twice daily.   Hypertension BP mildly elevated. - Continue Lopressor 75mg  twice daily.  - Previously on Amlodipine but this was stopped during last admission due to lower extremity edema. - Continue to monitor   AKI on CKD Stage III Creatinine 1.48 today, likely due to diuresis, will hold further IV lasix   Hyponatremia Sodium 126 on admission.  Suspect secondary to hypervolemia. - Sodium improved to 129 this morning    Otherwise, per primary team: - Hypothyroidism  - Hyperlipidemia - GERD    For questions or updates, please contact [facility] Please consult [email or web address] for contact info under        Signed, Little Ishikawa, MD  08/30/2023, 10:48 AM

## 2023-08-30 NOTE — Plan of Care (Signed)
Pt is progressing 

## 2023-08-30 NOTE — Plan of Care (Signed)
  Problem: Education: Goal: Knowledge of General Education information will improve Description: Including pain rating scale, medication(s)/side effects and non-pharmacologic comfort measures Outcome: Progressing   Problem: Clinical Measurements: Goal: Ability to maintain clinical measurements within normal limits will improve Outcome: Progressing Goal: Will remain free from infection Outcome: Progressing Goal: Diagnostic test results will improve Outcome: Progressing Goal: Respiratory complications will improve Outcome: Progressing Goal: Cardiovascular complication will be avoided Outcome: Progressing   Problem: Cardiac: Goal: Ability to achieve and maintain adequate cardiopulmonary perfusion will improve Outcome: Progressing

## 2023-08-30 NOTE — Progress Notes (Signed)
Echocardiogram 2D Echocardiogram has been performed.  [name] 08/30/2023, 3:13 PM

## 2023-08-30 NOTE — Progress Notes (Signed)
ANTICOAGULATION CONSULT NOTE - Initial Consult  Pharmacy Consult for Heparin (apixaban on hold for possible procedure) Indication: atrial fibrillation  Allergies  Allergen Reactions   Spironolactone Other (See Comments)    Hyponatremia-  the level of sodium in blood is too high   Atorvastatin Other (See Comments)    "Aches"     Patient Measurements: Height: 5\' 6"  (167.6 cm) Weight: 97.1 kg (214 lb 1.1 oz) IBW/kg (Calculated) : 59.3 Heparin Dosing Weight: 81 kg  Vital Signs: Temp: 97.8 F (36.6 C) (09/28 2002) Temp Source: Oral (09/28 1300) BP: 125/93 (09/28 2002) Pulse Rate: 77 (09/28 2002)  Labs: Recent Labs    08/28/23 1336 08/29/23 0526 08/30/23 0612  HGB 13.9 13.1 13.9  HCT 41.6 39.8 43.3  PLT 356 269 327  CREATININE 1.14* 1.17* 1.48*    Estimated Creatinine Clearance: 36.8 mL/min (A) (by C-G formula based on SCr of 1.48 mg/dL (H)).   Medical History: Past Medical History:  Diagnosis Date   Arthritis    Atrial fibrillation (HCC)    CHF (congestive heart failure) (HCC)    CKD (chronic kidney disease), stage III (HCC)    Colon polyps    Dysrhythmia    new onset afib    Goiter    Hyperglycemia    Hyperlipidemia    Hypertension    Hypothyroidism    Morbid obesity (HCC)    Osteopenia     Medications:  Apixaban 5mg  po BID - LD taken 08/30/23 @ 21:36  Assessment: 77 yr female admitted with fluid overload, hyponatremia, PMH significant for HTN, CHF, CKD, pericardial effusion, AFib and on apixaban. Pharmacy consulted to start IV heparin, as apixaban to be held for possible cath Due to recent apixaban use, heparin level will be falsely elevated until effects of apixaban have worn off.  Will monitor heparin therapy with aPTT until both levels correlate and then can continue monitor heparin with only heparin levels at that time.  Goal of Therapy:  Heparin level 0.3-0.7 units/ml aPTT 66-102 seconds Monitor platelets by anticoagulation protocol: Yes    Plan:  At 9am (when next apixaban dose would have been due) begin IV heparin gtt (no bolus) @ 1200 units/hr Check aPTT/heparin level 8 hr after heparin gtt started Daily heparin level & CBC   Terrion Poblano, Joselyn Glassman, PharmD 08/30/2023,11:28 PM

## 2023-08-30 NOTE — Progress Notes (Signed)
Progress Note   Patient: [name] GEX:[id number] DOB: 02/16/1946 DOA: 08/28/2023     0 DOS: the patient was seen and examined on 08/30/2023   Brief hospital course: 77yo with h/o HTN, afib-on Eliquis, chronic HFpEF, pericardial effusion s/p pericardiocentesis 01/2021, CKD stage IIIb, HTN, GERD, and class 1 obesity who was recently hospitalized in September for severe hyponatremia (from combination of excessive free water intake, Aldactone), Na++ 127 at dc, was sent on 9/26 from PCP office due to abnormal labs.  + nocturnal hallucinations, some abdominal pain, LE edema, orthopnea. She was admitted for acute on chronic diastolic CHF and treated with Lasix.  Cardiology consulted.  Assessment and Plan:  Acute on chronic HFpEF Patient presented with LE edema and exertional fatigue She was found to have elevated BNP In conjunction with hyponatremia (see below), this was concerning for hypervolemia associated with diastolic CHF exacerbation She felt significantly better on 9/27 - edema resolved, on RA, and able to ambulate up and down the halls without difficulty Discharge considered but cardiology preferred additional diuretics and an updated echo Renal function worsened with diuresis, will encourage more PO intake, hold further Lasix and recheck BMP on 9/29 Unfortunately, echo shows decreased EF to 30-35% with global hypokinesis Cardiology is continuing to consult   Chronic Hyponatremia On review of labs, Na++ was 130-131 in 06/2021  She appears to be at/near current baseline of 126-127, stable Slightly improved with additional diuresis but also appears dry at this time Will recheck in AM   Persistent atrial fibrillation Rate controlled with metoprolol Continue Eliquis   Hypertension Continue metoprolol   History of pericardial effusion  S/p pericardiocentesis in 2022 No frank effusion on imaging today   GERD Resume PPI   HLD Resume pravastatin   Hypothyroidism Resume  Synthroid   Diarrhea Resolved prior to hospitalization, per patient Negative C diff testing   Obesity Body mass index is 34.23 kg/m.Marland Kitchen  Weight loss should be encouraged Outpatient PCP/bariatric medicine f/u encouraged         Consultants: Cardiology Palliative care [facility] team   Procedures: Echocardiogram   Antibiotics: None    30 Day Unplanned Readmission Risk Score    Flowsheet Row ED to Hosp-Admission (Discharged) from 08/01/2023 in [facility]  30 Day Unplanned Readmission Risk Score (%) 12.53 Filed at 08/06/2023 0801       This score is the patient's risk of an unplanned readmission within 30 days of being discharged (0 -100%). The score is based on dignosis, age, lab data, medications, orders, and past utilization.   Low:  0-14.9   Medium: 15-21.9   High: 22-29.9   Extreme: 30 and above           Subjective: Feels tired today, otherwise ok.   Objective: Vitals:   08/30/23 0425 08/30/23 1225  BP: (!) 158/86 (!) 140/97  Pulse: 73 (!) 41  Resp: 18 16  Temp: 97.6 F (36.4 C)   SpO2: 97% 100%    Intake/Output Summary (Last 24 hours) at 08/30/2023 1604 Last data filed at 08/30/2023 1529 Gross per 24 hour  Intake 590 ml  Output 4275 ml  Net -3685 ml   Filed Weights   08/28/23 1650 08/29/23 0526 08/30/23 0700  Weight: 96.9 kg 96.2 kg 97.1 kg    Exam:  General:  Appears calm and comfortable and is in NAD Eyes:  PERRL, EOMI, normal lids, iris ENT:  grossly normal hearing, lips & tongue, mmm Neck:  no LAD, masses  or thyromegaly Cardiovascular:  RRR, no m/r/g. Trace LE edema.  Respiratory:   CTA bilaterally with no wheezes/rales/rhonchi.  Normal respiratory effort. Abdomen:  soft, NT, ND Skin:  no rash or induration seen on limited exam Musculoskeletal:  grossly normal tone BUE/BLE, good ROM, no bony abnormality Psychiatric:  grossly normal mood and affect, speech fluent and appropriate, AOx3 Neurologic:  CN 2-12 grossly  intact, moves all extremities in coordinated fashion  Data Reviewed: I have reviewed the patient's lab results since admission.  Pertinent labs for today include:   Na++ 129 BUN 25/Creatinine 1.48/GFR 36, up from 20/1.16/48 on 9/27 Unremarkable CBC C diff negative    Family Communication: Husband was present throughout evaluation  Disposition: Status is: Observation The patient remains OBS appropriate and will d/c before 2 midnights.     Time spent: 50 minutes  Unresulted Labs (From admission, onward)     Start     Ordered   08/31/23 0500  CBC with Differential/Platelet  Tomorrow morning,   R       Question:  Specimen collection method  Answer:  Lab=Lab collect   08/30/23 1604   08/31/23 0500  Basic metabolic panel  Tomorrow morning,   R       Question:  Specimen collection method  Answer:  Lab=Lab collect   08/30/23 1604             Author: Jonah Blue, MD 08/30/2023 4:04 PM  For on call review [email or web address].

## 2023-08-31 DIAGNOSIS — I5021 Acute systolic (congestive) heart failure: Secondary | ICD-10-CM | POA: Diagnosis not present

## 2023-08-31 DIAGNOSIS — Z23 Encounter for immunization: Secondary | ICD-10-CM | POA: Diagnosis not present

## 2023-08-31 DIAGNOSIS — E861 Hypovolemia: Secondary | ICD-10-CM | POA: Diagnosis not present

## 2023-08-31 DIAGNOSIS — E6609 Other obesity due to excess calories: Secondary | ICD-10-CM | POA: Diagnosis not present

## 2023-08-31 DIAGNOSIS — Z79890 Hormone replacement therapy (postmenopausal): Secondary | ICD-10-CM | POA: Diagnosis not present

## 2023-08-31 DIAGNOSIS — K219 Gastro-esophageal reflux disease without esophagitis: Secondary | ICD-10-CM | POA: Diagnosis not present

## 2023-08-31 DIAGNOSIS — E785 Hyperlipidemia, unspecified: Secondary | ICD-10-CM | POA: Diagnosis not present

## 2023-08-31 DIAGNOSIS — I110 Hypertensive heart disease with heart failure: Secondary | ICD-10-CM | POA: Diagnosis not present

## 2023-08-31 DIAGNOSIS — Z8249 Family history of ischemic heart disease and other diseases of the circulatory system: Secondary | ICD-10-CM | POA: Diagnosis not present

## 2023-08-31 DIAGNOSIS — T502X5A Adverse effect of carbonic-anhydrase inhibitors, benzothiadiazides and other diuretics, initial encounter: Secondary | ICD-10-CM | POA: Diagnosis not present

## 2023-08-31 DIAGNOSIS — Z7901 Long term (current) use of anticoagulants: Secondary | ICD-10-CM | POA: Diagnosis not present

## 2023-08-31 DIAGNOSIS — N179 Acute kidney failure, unspecified: Secondary | ICD-10-CM | POA: Diagnosis not present

## 2023-08-31 DIAGNOSIS — M8580 Other specified disorders of bone density and structure, unspecified site: Secondary | ICD-10-CM | POA: Diagnosis not present

## 2023-08-31 DIAGNOSIS — Z96652 Presence of left artificial knee joint: Secondary | ICD-10-CM | POA: Diagnosis not present

## 2023-08-31 DIAGNOSIS — I350 Nonrheumatic aortic (valve) stenosis: Secondary | ICD-10-CM

## 2023-08-31 DIAGNOSIS — I5041 Acute combined systolic (congestive) and diastolic (congestive) heart failure: Secondary | ICD-10-CM | POA: Diagnosis not present

## 2023-08-31 DIAGNOSIS — I4821 Permanent atrial fibrillation: Secondary | ICD-10-CM | POA: Diagnosis not present

## 2023-08-31 DIAGNOSIS — Z6834 Body mass index (BMI) 34.0-34.9, adult: Secondary | ICD-10-CM | POA: Diagnosis not present

## 2023-08-31 DIAGNOSIS — E871 Hypo-osmolality and hyponatremia: Secondary | ICD-10-CM | POA: Diagnosis not present

## 2023-08-31 DIAGNOSIS — D631 Anemia in chronic kidney disease: Secondary | ICD-10-CM | POA: Diagnosis not present

## 2023-08-31 DIAGNOSIS — E039 Hypothyroidism, unspecified: Secondary | ICD-10-CM | POA: Diagnosis not present

## 2023-08-31 DIAGNOSIS — I5043 Acute on chronic combined systolic (congestive) and diastolic (congestive) heart failure: Secondary | ICD-10-CM | POA: Insufficient documentation

## 2023-08-31 DIAGNOSIS — I3139 Other pericardial effusion (noninflammatory): Secondary | ICD-10-CM | POA: Diagnosis not present

## 2023-08-31 DIAGNOSIS — I2510 Atherosclerotic heart disease of native coronary artery without angina pectoris: Secondary | ICD-10-CM | POA: Diagnosis not present

## 2023-08-31 DIAGNOSIS — I5023 Acute on chronic systolic (congestive) heart failure: Secondary | ICD-10-CM | POA: Diagnosis present

## 2023-08-31 DIAGNOSIS — R441 Visual hallucinations: Secondary | ICD-10-CM | POA: Diagnosis not present

## 2023-08-31 DIAGNOSIS — Z515 Encounter for palliative care: Secondary | ICD-10-CM | POA: Diagnosis not present

## 2023-08-31 DIAGNOSIS — I130 Hypertensive heart and chronic kidney disease with heart failure and stage 1 through stage 4 chronic kidney disease, or unspecified chronic kidney disease: Secondary | ICD-10-CM | POA: Diagnosis not present

## 2023-08-31 DIAGNOSIS — N1832 Chronic kidney disease, stage 3b: Secondary | ICD-10-CM | POA: Diagnosis not present

## 2023-08-31 LAB — BASIC METABOLIC PANEL
Anion gap: 10 (ref 5–15)
BUN: 22 mg/dL (ref 8–23)
CO2: 29 mmol/L (ref 22–32)
Calcium: 8.7 mg/dL — ABNORMAL LOW (ref 8.9–10.3)
Chloride: 89 mmol/L — ABNORMAL LOW (ref 98–111)
Creatinine, Ser: 1.34 mg/dL — ABNORMAL HIGH (ref 0.44–1.00)
GFR, Estimated: 41 mL/min — ABNORMAL LOW (ref >60)
Glucose, Bld: 93 mg/dL (ref 70–99)
Potassium: 4.5 mmol/L (ref 3.5–5.1)
Sodium: 128 mmol/L — ABNORMAL LOW (ref 135–145)

## 2023-08-31 LAB — CBC WITH DIFFERENTIAL/PLATELET
Abs Immature Granulocytes: 0.03 10*3/uL (ref 0.00–0.07)
Basophils Absolute: 0.1 10*3/uL (ref 0.0–0.1)
Basophils Relative: 1 %
Eosinophils Absolute: 0.2 10*3/uL (ref 0.0–0.5)
Eosinophils Relative: 2 %
HCT: 43.1 % (ref 36.0–46.0)
Hemoglobin: 13.8 g/dL (ref 12.0–15.0)
Immature Granulocytes: 0 %
Lymphocytes Relative: 10 %
Lymphs Abs: 0.8 10*3/uL (ref 0.7–4.0)
MCH: 27 pg (ref 26.0–34.0)
MCHC: 32 g/dL (ref 30.0–36.0)
MCV: 84.3 fL (ref 80.0–100.0)
Monocytes Absolute: 0.7 10*3/uL (ref 0.1–1.0)
Monocytes Relative: 9 %
Neutro Abs: 5.9 10*3/uL (ref 1.7–7.7)
Neutrophils Relative %: 78 %
Platelets: 295 10*3/uL (ref 150–400)
RBC: 5.11 MIL/uL (ref 3.87–5.11)
RDW: 14.6 % (ref 11.5–15.5)
WBC: 7.7 10*3/uL (ref 4.0–10.5)
nRBC: 0 % (ref 0.0–0.2)

## 2023-08-31 LAB — APTT: aPTT: 104 s — ABNORMAL HIGH (ref 24–36)

## 2023-08-31 LAB — MAGNESIUM: Magnesium: 1.9 mg/dL (ref 1.7–2.4)

## 2023-08-31 LAB — HEPARIN LEVEL (UNFRACTIONATED): Heparin Unfractionated: >1.1 [IU]/mL — ABNORMAL HIGH (ref 0.30–0.70)

## 2023-08-31 NOTE — Plan of Care (Signed)

## 2023-08-31 NOTE — Progress Notes (Signed)
Mobility Specialist - Progress Note   08/31/23 0931  Mobility  Activity Ambulated with assistance in hallway (PT)  Level of Assistance Standby assist, set-up cues, supervision of patient - no hands on  Assistive Device Front wheel walker  Distance Ambulated (ft) 280 ft  Range of Motion/Exercises Active  Activity Response Tolerated well  Mobility Referral Yes  $Mobility charge 1 Mobility  Mobility Specialist Start Time (ACUTE ONLY) [id number]  Mobility Specialist Stop Time (ACUTE ONLY) [id number]  Mobility Specialist Time Calculation (min) (ACUTE ONLY) 11 min   Pt received in chair and agreed to mobility. Had no issues throughout session, returned to chair with all needs met.  Marilynne Halsted Mobility Specialist

## 2023-08-31 NOTE — Progress Notes (Signed)
Rounding Note    Patient Name: [name] Date of Encounter: 08/31/2023  [facility] Cardiologist: Elder Negus, MD   Subjective   Creatinine improved today (1.48 > 1.34), sodium stable (126 > 129 > 128).  BP 134/81.  Denies any chest pain or dyspnea   Inpatient Medications    Scheduled Meds:  cyanocobalamin  1,000 mcg Oral Daily   ferrous sulfate  325 mg Oral q1800   levothyroxine  125 mcg Oral Q0600   metoprolol tartrate  75 mg Oral BID   pantoprazole  40 mg Oral Daily   pravastatin  80 mg Oral Daily   Continuous Infusions:  heparin 1,200 Units/hr (08/31/23 0957)   PRN Meds: acetaminophen **OR** acetaminophen   Vital Signs    Vitals:   08/30/23 2002 08/31/23 0500 08/31/23 0529 08/31/23 0949  BP: (!) 125/93  (!) 154/76 134/81  Pulse: 77  61 87  Resp: 18  18   Temp: 97.8 F (36.6 C)  97.6 F (36.4 C)   TempSrc:   Oral   SpO2: 96%  98%   Weight:  97 kg    Height:        Intake/Output Summary (Last 24 hours) at 08/31/2023 1104 Last data filed at 08/31/2023 0900 Gross per 24 hour  Intake 450 ml  Output 3225 ml  Net -2775 ml      08/31/2023    5:00 AM 08/30/2023    7:00 AM 08/29/2023    5:26 AM  Last 3 Weights  Weight (lbs) 213 lb 13.5 oz 214 lb 1.1 oz 212 lb 1.3 oz  Weight (kg) 97 kg 97.1 kg 96.2 kg      Telemetry    Afib, rate controlled - Personally Reviewed  ECG    NO new ECG - Personally Reviewed  Physical Exam   GEN: No acute distress.   Neck: No JVD Cardiac: irregular, normal rate, no murmurs, rubs, or gallops.  Respiratory: Clear to auscultation bilaterally. GI: Soft, nontender, non-distended  MS: No edema; No deformity. Neuro:  Nonfocal  Psych: Normal affect   Labs    High Sensitivity Troponin:  No results for input(s): "TROPONINIHS" in the last 720 hours.   Chemistry Recent Labs  Lab 08/28/23 1336 08/29/23 0526 08/30/23 0612 08/31/23 0600  NA 126* 126* 129* 128*  K 3.5 3.4* 4.8 4.5  CL 89* 89* 90*  89*  CO2 25 28 29 29   GLUCOSE 113* 97 102* 93  BUN 19 20 25* 22  CREATININE 1.14* 1.17* 1.48* 1.34*  CALCIUM 9.2 8.5* 8.8* 8.7*  MG  --  1.8  --  1.9  PROT 7.0  --   --   --   ALBUMIN 4.0  --   --   --   AST 20  --   --   --   ALT 17  --   --   --   ALKPHOS 51  --   --   --   BILITOT 1.0  --   --   --   GFRNONAA 49* 48* 36* 41*  ANIONGAP 12 9 10 10     Lipids No results for input(s): "CHOL", "TRIG", "HDL", "LABVLDL", "LDLCALC", "CHOLHDL" in the last 168 hours.  Hematology Recent Labs  Lab 08/29/23 0526 08/30/23 0612 08/31/23 0600  WBC 9.4 9.2 7.7  RBC 4.78 5.15* 5.11  HGB 13.1 13.9 13.8  HCT 39.8 43.3 43.1  MCV 83.3 84.1 84.3  MCH 27.4 27.0 27.0  MCHC 32.9  32.1 32.0  RDW 14.0 14.5 14.6  PLT 269 327 295   Thyroid No results for input(s): "TSH", "FREET4" in the last 168 hours.  BNP Recent Labs  Lab 08/28/23 1336  BNP 1,037.6*    DDimer No results for input(s): "DDIMER" in the last 168 hours.   Radiology    ECHOCARDIOGRAM COMPLETE  Result Date: 08/30/2023    ECHOCARDIOGRAM REPORT   Patient Name:   [name] Date of Exam: 08/30/2023 Medical Rec #:  [id number]      Height:       66.0 in Accession #:    [id number]     Weight:       214.1 lb Date of Birth:  1946-06-24       BSA:          2.058 m Patient Age:    77 years       BP:           158/86 mmHg Patient Gender: F              HR:           98 bpm. Exam Location:  Inpatient Procedure: 2D Echo, Cardiac Doppler and Color Doppler Indications:    CHF-Acute Diastolic I50.31  History:        Patient has prior history of Echocardiogram examinations, most                 recent 02/09/2021. CHF; Risk Factors:Hypertension and                 Dyslipidemia. CKD, stage 3.  Sonographer:    Lucendia Herrlich Referring Phys: [id number] CALLIE E GOODRICH IMPRESSIONS  1. Left ventricular ejection fraction, by estimation, is 30 to 35%. The left ventricle has moderately decreased function. The left ventricle demonstrates global hypokinesis. There  is mild left ventricular hypertrophy. Left ventricular diastolic parameters are indeterminate.  2. Right ventricular systolic function is moderately reduced. The right ventricular size is normal.  3. Left atrial size was severely dilated.  4. Right atrial size was severely dilated.  5. Moderate pleural effusion.  6. Trivial mitral valve regurgitation.  7. Tricuspid valve regurgitation is mild to moderate.  8. There is moderate calcification of the aortic valve. Aortic valve regurgitation is mild. Mild aortic valve stenosis.  9. The inferior vena cava is dilated in size with >50% respiratory variability, suggesting right atrial pressure of 8 mmHg. Conclusion(s)/Recommendation(s): EF has declined compared to prior study. FINDINGS  Left Ventricle: Left ventricular ejection fraction, by estimation, is 30 to 35%. The left ventricle has moderately decreased function. The left ventricle demonstrates global hypokinesis. The left ventricular internal cavity size was normal in size. There is mild left ventricular hypertrophy. Abnormal (paradoxical) septal motion, consistent with left bundle branch block. Left ventricular diastolic parameters are indeterminate. Right Ventricle: The right ventricular size is normal. Right ventricular systolic function is moderately reduced. Left Atrium: Left atrial size was severely dilated. Right Atrium: Right atrial size was severely dilated. Pericardium: There is no evidence of pericardial effusion. Mitral Valve: Trivial mitral valve regurgitation. Tricuspid Valve: The tricuspid valve is normal in structure. Tricuspid valve regurgitation is mild to moderate. Aortic Valve: There is moderate calcification of the aortic valve. Aortic valve regurgitation is mild. Aortic regurgitation PHT measures 604 msec. Mild aortic stenosis is present. Aortic valve mean gradient measures 11.7 mmHg. Aortic valve peak gradient measures 20.9 mmHg. Aortic valve area, by VTI measures 0.48 cm. Pulmonic Valve:  Pulmonic valve regurgitation  is mild. Aorta: The aortic root and ascending aorta are structurally normal, with no evidence of dilitation. Venous: The inferior vena cava is dilated in size with greater than 50% respiratory variability, suggesting right atrial pressure of 8 mmHg. IAS/Shunts: No atrial level shunt detected by color flow Doppler. Additional Comments: There is a moderate pleural effusion.  LEFT VENTRICLE PLAX 2D LVIDd:         3.80 cm   Diastology LVIDs:         2.65 cm   LV e' medial:    7.38 cm/s LV PW:         1.10 cm   LV E/e' medial:  19.9 LV IVS:        1.30 cm   LV e' lateral:   12.65 cm/s LVOT diam:     1.60 cm   LV E/e' lateral: 11.6 LV SV:         23 LV SV Index:   11 LVOT Area:     2.01 cm  RIGHT VENTRICLE            IVC RV S prime:     5.59 cm/s  IVC diam: 2.40 cm TAPSE (M-mode): 0.9 cm LEFT ATRIUM              Index        RIGHT ATRIUM           Index LA diam:        4.90 cm  2.38 cm/m   RA Area:     19.00 cm LA Vol (A2C):   83.3 ml  40.47 ml/m  RA Volume:   47.10 ml  22.88 ml/m LA Vol (A4C):   106.0 ml 51.50 ml/m LA Biplane Vol: 97.2 ml  47.22 ml/m  AORTIC VALVE AV Area (Vmax):    0.57 cm AV Area (Vmean):   0.52 cm AV Area (VTI):     0.48 cm AV Vmax:           228.67 cm/s AV Vmean:          159.667 cm/s AV VTI:            0.476 m AV Peak Grad:      20.9 mmHg AV Mean Grad:      11.7 mmHg LVOT Vmax:         64.43 cm/s LVOT Vmean:        41.533 cm/s LVOT VTI:          0.113 m LVOT/AV VTI ratio: 0.24 AI PHT:            604 msec  AORTA Ao Root diam: 3.10 cm Ao Asc diam:  3.30 cm MITRAL VALVE                TRICUSPID VALVE MV Area (PHT): 7.99 cm     TR Peak grad:   50.1 mmHg MV Decel Time: 95 msec      TR Vmax:        354.00 cm/s MR Peak grad: 45.2 mmHg MR Vmax:      336.00 cm/s   SHUNTS MV E velocity: 147.00 cm/s  Systemic VTI:  0.11 m                             Systemic Diam: 1.60 cm Carolan Clines Electronically signed by Carolan Clines Signature Date/Time: 08/30/2023/3:29:24 PM    Final      Cardiac Studies  Patient Profile     77 y.o. female with history of diastolic heart failure, permanent atrial fibrillation on Eliquis, pericardial effusion status post pericardiocentesis in 2022, hypertension, CKD stage IIIa admitted on 08/28/2023 for acute on chronic diastolic heart failure.  Course complicated by hypovolemic hyponatremia.  Assessment & Plan    Acute combined heart failure: Has history of diastolic heart failure but echo this admission shows new systolic dysfunction (EF 30 to 35%).  Echo also notable for moderately reduced RV function, severe biatrial enlargement.  BNP 1038 on presentation.  Unclear cause of systolic heart failure.  Septal dyskinesis, could be related to bundle branch block.  Need to rule out obstructive CAD.  Could also be related to AS as below -Recommend RHC/LHC for further evaluation.  Risks and benefits of cardiac catheterization have been discussed with the patient.  These include bleeding, infection, kidney damage, stroke, heart attack, death.  The patient understands these risks and is willing to proceed. -Started on IV Lasix on admission.  Worsening creatinine yesterday (1.17 > 1.48), discontinued IV Lasix.  Will hold lasix for today and assess filling pressures on RHC tomorrow -Continue Lopressor, can plan to consolidate to Toprol-XL on discharge -Can plan to add Entresto versus losartan after cath pending BP/renal function  Aortic stenosis: Echo read as mild aortic stenosis but appears more significant to me.  Mild AS by gradients but severe by AVA and DI.  Low stroke-volume index, suspect severe paradoxical low-flow low gradient aortic stenosis.  Would recommend evaluation by valve clinic as outpatient  Hyponatremia: Has been chronic issue.  Sodium 126 on presentation, improved to 129 on 9/28 with diuresis  AKI: Likely due to diuresis, creatinine increased 1.17 > 1.48.  Holding IV Lasix as above, improved to 1.34 today  Hypertension:  Continue Lopressor 75 mg twice daily  Permanent atrial fibrillation: Rate controlled on Lopressor 75 mg twice daily.  Hold Eliquis, switch to heparin drip for cath as above     For questions or updates, please contact [facility] Please consult [email or web address] for contact info under        Signed, Little Ishikawa, MD  08/31/2023, 11:04 AM

## 2023-08-31 NOTE — Progress Notes (Addendum)
Progress Note   Patient: [name] QIH:[id number] DOB: 10/15/1946 DOA: 08/28/2023     0 DOS: the patient was seen and examined on 08/31/2023   Brief hospital course: 77yo with h/o HTN, afib-on Eliquis, chronic HFpEF, pericardial effusion s/p pericardiocentesis 01/2021, CKD stage IIIb, HTN, GERD, and class 1 obesity who was recently hospitalized in September for severe hyponatremia (from combination of excessive free water intake, Aldactone), Na++ 127 at dc, was sent on 9/26 from PCP office due to abnormal labs.  + nocturnal hallucinations, some abdominal pain, LE edema, orthopnea. She was admitted for acute on chronic diastolic CHF and treated with Lasix.  Cardiology consulted.  Assessment and Plan:  Acute on chronic HFrEF Patient presented with LE edema and exertional fatigue She was found to have elevated BNP In conjunction with hyponatremia (see below), this was concerning for hypervolemia associated with diastolic CHF exacerbation She felt significantly better on 9/27 - edema resolved, on RA, and able to ambulate up and down the halls without difficulty Discharge considered but cardiology preferred additional diuretics and an updated echo Renal function worsened with diuresis, holding further Lasix and this is improving Unfortunately, echo shows decreased EF to 30-35% with global hypokinesis Cardiology is continuing to consult, now planning for diagnostic cath tomorrow given new-onset reduced EF   Chronic Hyponatremia On review of labs, Na++ was 130-131 in 06/2021  She appears to be at/near current baseline of 126-127, stable Will recheck in AM  AKI on stable 3a CKD Slightly worsened with diuresis, now improving Continue to hold Lasix for now Repeat BMP in AM   Persistent atrial fibrillation Rate controlled with metoprolol Continue Eliquis - transition to Heparin for cath and then back to Eliquis   Hypertension Continue metoprolol   History of pericardial effusion  S/p  pericardiocentesis in 2022 No frank effusion on imaging today   GERD Resume PPI   HLD Resume pravastatin   Hypothyroidism Resume Synthroid   Obesity Body mass index is 34.23 kg/m.Marland Kitchen  Weight loss should be encouraged Outpatient PCP/bariatric medicine f/u encouraged         Consultants: Cardiology Palliative care [facility] team   Procedures: Echocardiogram   Antibiotics: None    30 Day Unplanned Readmission Risk Score    Flowsheet Row ED to Hosp-Admission (Discharged) from 08/01/2023 in [facility]  30 Day Unplanned Readmission Risk Score (%) 12.53 Filed at 08/06/2023 0801       This score is the patient's risk of an unplanned readmission within 30 days of being discharged (0 -100%). The score is based on dignosis, age, lab data, medications, orders, and past utilization.   Low:  0-14.9   Medium: 15-21.9   High: 22-29.9   Extreme: 30 and above           Subjective: No symptoms today.  No CP, minimal DOE that she thinks is from being hospitalized.  She was hoping to go home for outpatient cath but agreed to stay overnight when cardiology discussed it with her today.   Objective: Vitals:   08/30/23 2002 08/31/23 0529  BP: (!) 125/93 (!) 154/76  Pulse: 77 61  Resp: 18 18  Temp: 97.8 F (36.6 C) 97.6 F (36.4 C)  SpO2: 96% 98%    Intake/Output Summary (Last 24 hours) at 08/31/2023 0738 Last data filed at 08/31/2023 0500 Gross per 24 hour  Intake 594 ml  Output 3225 ml  Net -2631 ml   Filed Weights   08/29/23 0526 08/30/23 0700  08/31/23 0500  Weight: 96.2 kg 97.1 kg 97 kg    Exam:  General:  Appears calm and comfortable and is in NAD, sitting in bedside chair, on RA Eyes:  PERRL, EOMI, normal lids, iris ENT:  grossly normal hearing, lips & tongue, mmm Neck:  no LAD, masses or thyromegaly Cardiovascular:  RRR, no m/r/g. Trace LE edema.  Respiratory:   CTA bilaterally with no wheezes/rales/rhonchi.  Normal respiratory  effort. Abdomen:  soft, NT, ND Skin:  no rash or induration seen on limited exam Musculoskeletal:  grossly normal tone BUE/BLE, good ROM, no bony abnormality Psychiatric:  grossly normal mood and affect, speech fluent and appropriate, AOx3 Neurologic:  CN 2-12 grossly intact, moves all extremities in coordinated fashion  Data Reviewed: I have reviewed the patient's lab results since admission.  Pertinent labs for today include:   Na++ 128  BUN 22/Creatinine 1.34/GFR 41, improving Normal CBC     Family Communication: Husband was present throughout evaluation  Disposition: Status is: Inpatient Admit - It is my clinical opinion that admission to INPATIENT is reasonable and necessary because of the expectation that this patient will require hospital care that crosses at least 2 midnights to treat this condition based on the medical complexity of the problems presented.  Given the aforementioned information, the predictability of an adverse outcome is felt to be significant.      Time spent: 50 minutes  Unresulted Labs (From admission, onward)     Start     Ordered   09/01/23 0500  CBC  Daily,   R     Question:  Specimen collection method  Answer:  Lab=Lab collect   08/30/23 2337   08/31/23 1700  APTT  Once-Timed,   TIMED       Question:  Specimen collection method  Answer:  Lab=Lab collect   08/30/23 2337   08/31/23 1700  Heparin level (unfractionated)  Once-Timed,   TIMED       Question:  Specimen collection method  Answer:  Lab=Lab collect   08/30/23 2337             Author: Jonah Blue, MD 08/31/2023 7:38 AM  For on call review [email or web address].

## 2023-08-31 NOTE — Progress Notes (Addendum)
ANTICOAGULATION CONSULT NOTE  Pharmacy Consult for Heparin (apixaban on hold) Indication: atrial fibrillation  Allergies  Allergen Reactions   Spironolactone Other (See Comments)    Hyponatremia-  the level of sodium in blood is too high   Atorvastatin Other (See Comments)    "Aches"     Patient Measurements: Height: 5\' 6"  (167.6 cm) Weight: 97 kg (213 lb 13.5 oz) IBW/kg (Calculated) : 59.3 Heparin Dosing Weight: 81 kg  Vital Signs: Temp: 97.9 F (36.6 C) (09/29 2021) Temp Source: Oral (09/29 1955) BP: 155/117 (09/29 2021) Pulse Rate: 102 (09/29 2021)  Labs: Recent Labs    08/29/23 0526 08/30/23 0612 08/31/23 0600 08/31/23 1850  HGB 13.1 13.9 13.8  --   HCT 39.8 43.3 43.1  --   PLT 269 327 295  --   APTT  --   --   --  104*  HEPARINUNFRC  --   --   --  >1.10*  CREATININE 1.17* 1.48* 1.34*  --     Estimated Creatinine Clearance: 40.6 mL/min (A) (by C-G formula based on SCr of 1.34 mg/dL (H)).   Medical History: Past Medical History:  Diagnosis Date   Arthritis    Atrial fibrillation (HCC)    CHF (congestive heart failure) (HCC)    CKD (chronic kidney disease), stage III (HCC)    Colon polyps    Dysrhythmia    new onset afib    Goiter    Hyperglycemia    Hyperlipidemia    Hypertension    Hypothyroidism    Morbid obesity (HCC)    Osteopenia     Medications:  Apixaban 5mg  po BID - LD taken 08/30/23 @ 21:36  Assessment: 77 yr female admitted with fluid overload, hyponatremia, PMH significant for HTN, CHF, CKD, pericardial effusion, afib and on apixaban. Pharmacy consulted to start IV heparin, as apixaban to be held for possible cath Due to recent apixaban use, heparin level will be falsely elevated until effects of apixaban have worn off. Will monitor heparin therapy with aPTT until both levels correlate and then can continue monitor heparin with only heparin levels at that time.  Today, 08/31/23: aPTT = 104 seconds, supratherapeutic Heparin level >  1.1 units/mL, falsely elevated as expected due to recent DOAC CBC WNL Per RN, heparin infusing in left arm and aPTT/heparin level collected from right arm No bleeding or infusion issues noted per nursing  Goal of Therapy:  Heparin level 0.3-0.7 units/ml aPTT 66-102 seconds Monitor platelets by anticoagulation protocol: Yes   Plan:  Decrease heparin infusion to 1100 units/hr aPTT and heparin level 8 hours after rate change Daily CBC, heparin level, aPTT Noted plans for cardiac cath tomorrow     Greer Pickerel, PharmD, BCPS Clinical Pharmacist 08/31/2023,9:11 PM

## 2023-09-01 ENCOUNTER — Encounter (HOSPITAL_COMMUNITY)
Admission: EM | Disposition: A | Discharge status: Home / Self Care | Payer: Self-pay | Source: Home / Self Care | Attending: Internal Medicine

## 2023-09-01 DIAGNOSIS — I2510 Atherosclerotic heart disease of native coronary artery without angina pectoris: Secondary | ICD-10-CM

## 2023-09-01 DIAGNOSIS — E871 Hypo-osmolality and hyponatremia: Secondary | ICD-10-CM | POA: Diagnosis not present

## 2023-09-01 DIAGNOSIS — I5043 Acute on chronic combined systolic (congestive) and diastolic (congestive) heart failure: Secondary | ICD-10-CM | POA: Diagnosis not present

## 2023-09-01 DIAGNOSIS — I110 Hypertensive heart disease with heart failure: Secondary | ICD-10-CM

## 2023-09-01 DIAGNOSIS — I5021 Acute systolic (congestive) heart failure: Secondary | ICD-10-CM | POA: Diagnosis not present

## 2023-09-01 HISTORY — PX: RIGHT/LEFT HEART CATH AND CORONARY ANGIOGRAPHY: CATH118266

## 2023-09-01 LAB — BASIC METABOLIC PANEL
Anion gap: 11 (ref 5–15)
BUN: 21 mg/dL (ref 8–23)
CO2: 26 mmol/L (ref 22–32)
Calcium: 8.9 mg/dL (ref 8.9–10.3)
Chloride: 90 mmol/L — ABNORMAL LOW (ref 98–111)
Creatinine, Ser: 1.06 mg/dL — ABNORMAL HIGH (ref 0.44–1.00)
GFR, Estimated: 54 mL/min — ABNORMAL LOW (ref >60)
Glucose, Bld: 111 mg/dL — ABNORMAL HIGH (ref 70–99)
Potassium: 3.9 mmol/L (ref 3.5–5.1)
Sodium: 127 mmol/L — ABNORMAL LOW (ref 135–145)

## 2023-09-01 LAB — POCT I-STAT 7, (LYTES, BLD GAS, ICA,H+H)
Acid-Base Excess: 0 mmol/L (ref 0.0–2.0)
Bicarbonate: 24.5 mmol/L (ref 20.0–28.0)
Calcium, Ion: 1.21 mmol/L (ref 1.15–1.40)
HCT: 45 % (ref 36.0–46.0)
Hemoglobin: 15.3 g/dL — ABNORMAL HIGH (ref 12.0–15.0)
O2 Saturation: 94 %
Potassium: 4.2 mmol/L (ref 3.5–5.1)
Sodium: 126 mmol/L — ABNORMAL LOW (ref 135–145)
TCO2: 26 mmol/L (ref 22–32)
pCO2 arterial: 38.2 mm[Hg] (ref 32–48)
pH, Arterial: 7.414 (ref 7.35–7.45)
pO2, Arterial: 71 mm[Hg] — ABNORMAL LOW (ref 83–108)

## 2023-09-01 LAB — POCT I-STAT EG7
Acid-Base Excess: 1 mmol/L (ref 0.0–2.0)
Acid-Base Excess: 2 mmol/L (ref 0.0–2.0)
Bicarbonate: 26.8 mmol/L (ref 20.0–28.0)
Bicarbonate: 27.2 mmol/L (ref 20.0–28.0)
Calcium, Ion: 1.19 mmol/L (ref 1.15–1.40)
Calcium, Ion: 1.2 mmol/L (ref 1.15–1.40)
HCT: 45 % (ref 36.0–46.0)
HCT: 46 % (ref 36.0–46.0)
Hemoglobin: 15.3 g/dL — ABNORMAL HIGH (ref 12.0–15.0)
Hemoglobin: 15.6 g/dL — ABNORMAL HIGH (ref 12.0–15.0)
O2 Saturation: 61 %
O2 Saturation: 61 %
Potassium: 4.1 mmol/L (ref 3.5–5.1)
Potassium: 4.1 mmol/L (ref 3.5–5.1)
Sodium: 126 mmol/L — ABNORMAL LOW (ref 135–145)
Sodium: 126 mmol/L — ABNORMAL LOW (ref 135–145)
TCO2: 28 mmol/L (ref 22–32)
TCO2: 29 mmol/L (ref 22–32)
pCO2, Ven: 45.1 mm[Hg] (ref 44–60)
pCO2, Ven: 45.4 mm[Hg] (ref 44–60)
pH, Ven: 7.383 (ref 7.25–7.43)
pH, Ven: 7.386 (ref 7.25–7.43)
pO2, Ven: 32 mm[Hg] (ref 32–45)
pO2, Ven: 33 mm[Hg] (ref 32–45)

## 2023-09-01 LAB — CBC
HCT: 42.7 % (ref 36.0–46.0)
Hemoglobin: 14.2 g/dL (ref 12.0–15.0)
MCH: 28 pg (ref 26.0–34.0)
MCHC: 33.3 g/dL (ref 30.0–36.0)
MCV: 84.1 fL (ref 80.0–100.0)
Platelets: 295 10*3/uL (ref 150–400)
RBC: 5.08 MIL/uL (ref 3.87–5.11)
RDW: 14.4 % (ref 11.5–15.5)
WBC: 9.2 10*3/uL (ref 4.0–10.5)
nRBC: 0 % (ref 0.0–0.2)

## 2023-09-01 LAB — APTT
aPTT: 142 s — ABNORMAL HIGH (ref 24–36)
aPTT: 32 s (ref 24–36)

## 2023-09-01 LAB — HEPARIN LEVEL (UNFRACTIONATED): Heparin Unfractionated: >1.1 [IU]/mL — ABNORMAL HIGH (ref 0.30–0.70)

## 2023-09-01 SURGERY — RIGHT/LEFT HEART CATH AND CORONARY ANGIOGRAPHY
Anesthesia: LOCAL

## 2023-09-01 MED ORDER — APIXABAN 5 MG PO TABS
5.0000 mg | ORAL_TABLET | Freq: Two times a day (BID) | ORAL | Status: DC
  Administered 2023-09-01 – 2023-09-02 (×2): 5 mg via ORAL
  Filled 2023-09-01 (×2): qty 1

## 2023-09-01 MED ORDER — FUROSEMIDE 10 MG/ML IJ SOLN
40.0000 mg | Freq: Once | INTRAMUSCULAR | Status: AC
  Administered 2023-09-01: 40 mg via INTRAVENOUS

## 2023-09-01 MED ORDER — SODIUM CHLORIDE 0.9% FLUSH
3.0000 mL | Freq: Two times a day (BID) | INTRAVENOUS | Status: DC
  Administered 2023-09-01 – 2023-09-02 (×2): 3 mL via INTRAVENOUS

## 2023-09-01 MED ORDER — LABETALOL HCL 5 MG/ML IV SOLN: 10.0000 mg | INTRAVENOUS | Status: AC | PRN

## 2023-09-01 MED ORDER — VERAPAMIL HCL 2.5 MG/ML IV SOLN
INTRAVENOUS | Status: AC
  Filled 2023-09-01: qty 2

## 2023-09-01 MED ORDER — ASPIRIN 81 MG PO CHEW: 81.0000 mg | CHEWABLE_TABLET | ORAL | Status: DC

## 2023-09-01 MED ORDER — HEPARIN SODIUM (PORCINE) 1000 UNIT/ML IJ SOLN
INTRAMUSCULAR | Status: DC | PRN
  Administered 2023-09-01: 5000 [IU] via INTRAVENOUS

## 2023-09-01 MED ORDER — SODIUM CHLORIDE 0.9% FLUSH: 3.0000 mL | INTRAVENOUS | Status: DC | PRN

## 2023-09-01 MED ORDER — HEPARIN SODIUM (PORCINE) 1000 UNIT/ML IJ SOLN
INTRAMUSCULAR | Status: AC
  Filled 2023-09-01: qty 10

## 2023-09-01 MED ORDER — HYDRALAZINE HCL 20 MG/ML IJ SOLN: 10.0000 mg | INTRAMUSCULAR | Status: AC | PRN

## 2023-09-01 MED ORDER — SODIUM CHLORIDE 0.9 % IV SOLN: INTRAVENOUS | Status: DC

## 2023-09-01 MED ORDER — ACETAMINOPHEN 325 MG PO TABS
ORAL_TABLET | ORAL | Status: AC
  Administered 2023-09-01: 650 mg via ORAL
  Filled 2023-09-01: qty 2

## 2023-09-01 MED ORDER — LIDOCAINE HCL (PF) 1 % IJ SOLN
INTRAMUSCULAR | Status: DC | PRN
  Administered 2023-09-01: 5 mL
  Administered 2023-09-01: 2 mL

## 2023-09-01 MED ORDER — SODIUM CHLORIDE 0.9 % IV SOLN: 250.0000 mL | INTRAVENOUS | Status: DC | PRN

## 2023-09-01 MED ORDER — LIDOCAINE HCL (PF) 1 % IJ SOLN
INTRAMUSCULAR | Status: AC
  Filled 2023-09-01: qty 30

## 2023-09-01 MED ORDER — HEPARIN (PORCINE) 25000 UT/250ML-% IV SOLN
900.0000 [IU]/h | INTRAVENOUS | Status: DC
  Administered 2023-09-01: 900 [IU]/h via INTRAVENOUS
  Filled 2023-09-01: qty 250

## 2023-09-01 MED ORDER — FUROSEMIDE 10 MG/ML IJ SOLN
INTRAMUSCULAR | Status: AC
  Filled 2023-09-01: qty 4

## 2023-09-01 MED ORDER — ASPIRIN 81 MG PO CHEW
81.0000 mg | CHEWABLE_TABLET | ORAL | Status: AC
  Administered 2023-09-01: 81 mg via ORAL
  Filled 2023-09-01: qty 1

## 2023-09-01 MED ORDER — HEPARIN (PORCINE) IN NACL 1000-0.9 UT/500ML-% IV SOLN
INTRAVENOUS | Status: DC | PRN
  Administered 2023-09-01 (×2): 500 mL

## 2023-09-01 MED ORDER — VERAPAMIL HCL 2.5 MG/ML IV SOLN
INTRAVENOUS | Status: DC | PRN
  Administered 2023-09-01: 10 mL via INTRA_ARTERIAL

## 2023-09-01 MED ORDER — SODIUM CHLORIDE 0.9 % IV SOLN: INTRAVENOUS | Status: AC

## 2023-09-01 SURGICAL SUPPLY — 17 items
CATH BALLN WEDGE 5F 110CM (CATHETERS) IMPLANT
CATH INFINITI 5FR AL1 (CATHETERS) IMPLANT
CATH INFINITI 5FR ANG PIGTAIL (CATHETERS) IMPLANT
CATH INFINITI AMBI 5FR TG (CATHETERS) IMPLANT
DEVICE RAD COMP TR BAND LRG (VASCULAR PRODUCTS) IMPLANT
GLIDESHEATH SLEND SS 6F .021 (SHEATH) IMPLANT
GUIDEWIRE .025 260CM (WIRE) IMPLANT
GUIDEWIRE INQWIRE 1.5J.035X260 (WIRE) IMPLANT
INQWIRE 1.5J .035X260CM (WIRE) ×1
PACK CARDIAC CATHETERIZATION (CUSTOM PROCEDURE TRAY) ×1 IMPLANT
PROTECTION STATION PRESSURIZED (MISCELLANEOUS) ×1
SET ATX-X65L (MISCELLANEOUS) IMPLANT
SHEATH GLIDE SLENDER 4/5FR (SHEATH) IMPLANT
SHEATH PROBE COVER 6X72 (BAG) IMPLANT
STATION PROTECTION PRESSURIZED (MISCELLANEOUS) IMPLANT
WIRE EMERALD 3MM-J .025X260CM (WIRE) IMPLANT
WIRE EMERALD ST .035X150CM (WIRE) IMPLANT

## 2023-09-01 NOTE — Plan of Care (Signed)
  Problem: Cardiac: Goal: Ability to achieve and maintain adequate cardiopulmonary perfusion will improve Outcome: Progressing   

## 2023-09-01 NOTE — Plan of Care (Signed)
Pt is progressing 

## 2023-09-01 NOTE — Progress Notes (Signed)
Progress Note  Patient Name: [name] Date of Encounter: 09/01/2023  Primary Cardiologist:   Elder Negus, MD   Subjective   Denies pain or SOB.    Inpatient Medications    Scheduled Meds:  cyanocobalamin  1,000 mcg Oral Daily   ferrous sulfate  325 mg Oral q1800   levothyroxine  125 mcg Oral Q0600   metoprolol tartrate  75 mg Oral BID   pantoprazole  40 mg Oral Daily   pravastatin  80 mg Oral Daily   Continuous Infusions:  sodium chloride     heparin 900 Units/hr (09/01/23 0758)   PRN Meds: acetaminophen **OR** acetaminophen   Vital Signs    Vitals:   08/31/23 1955 08/31/23 2021 09/01/23 0439 09/01/23 0456  BP: (!) 153/117 (!) 155/117 (!) 154/117   Pulse: (!) 115 (!) 102 81   Resp:      Temp: 97.9 F (36.6 C) 97.9 F (36.6 C) 98 F (36.7 C)   TempSrc: Oral     SpO2: 99% 96% 95%   Weight:    97.1 kg  Height:        Intake/Output Summary (Last 24 hours) at 09/01/2023 0916 Last data filed at 09/01/2023 0615 Gross per 24 hour  Intake 542.31 ml  Output 253 ml  Net 289.31 ml   Filed Weights   08/30/23 0700 08/31/23 0500 09/01/23 0456  Weight: 97.1 kg 97 kg 97.1 kg    Telemetry    Atrial fib with controlled ventricular rate - Personally Reviewed  ECG     - Personally Reviewed  Physical Exam   GEN: No acute distress.   Neck: No  JVD Cardiac: Irregular RR, 2/6 apical systolic murmur, no diastolic murmurs, rubs, or gallops.  Respiratory: Clear  to auscultation bilaterally. GI: Soft, nontender, non-distended  MS: No  edema; No deformity. Neuro:  Nonfocal  Psych: Normal affect   Labs    Chemistry Recent Labs  Lab 08/28/23 1336 08/29/23 0526 08/30/23 0612 08/31/23 0600 09/01/23 0451  NA 126*   < > 129* 128* 127*  K 3.5   < > 4.8 4.5 3.9  CL 89*   < > 90* 89* 90*  CO2 25   < > 29 29 26   GLUCOSE 113*   < > 102* 93 111*  BUN 19   < > 25* 22 21  CREATININE 1.14*   < > 1.48* 1.34* 1.06*  CALCIUM 9.2   < > 8.8* 8.7* 8.9   PROT 7.0  --   --   --   --   ALBUMIN 4.0  --   --   --   --   AST 20  --   --   --   --   ALT 17  --   --   --   --   ALKPHOS 51  --   --   --   --   BILITOT 1.0  --   --   --   --   GFRNONAA 49*   < > 36* 41* 54*  ANIONGAP 12   < > 10 10 11    < > = values in this interval not displayed.     Hematology Recent Labs  Lab 08/30/23 0612 08/31/23 0600 09/01/23 0451  WBC 9.2 7.7 9.2  RBC 5.15* 5.11 5.08  HGB 13.9 13.8 14.2  HCT 43.3 43.1 42.7  MCV 84.1 84.3 84.1  MCH 27.0 27.0 28.0  MCHC 32.1 32.0 33.3  RDW 14.5 14.6 14.4  PLT 327 295 295    Cardiac EnzymesNo results for input(s): "TROPONINI" in the last 168 hours. No results for input(s): "TROPIPOC" in the last 168 hours.   BNP Recent Labs  Lab 08/28/23 1336  BNP 1,037.6*     DDimer No results for input(s): "DDIMER" in the last 168 hours.   Radiology    ECHOCARDIOGRAM COMPLETE  Result Date: 08/30/2023    ECHOCARDIOGRAM REPORT   Patient Name:   [name] Date of Exam: 08/30/2023 Medical Rec #:  [id number]      Height:       66.0 in Accession #:    [id number]     Weight:       214.1 lb Date of Birth:  11/26/1946       BSA:          2.058 m Patient Age:    6 years       BP:           158/86 mmHg Patient Gender: F              HR:           98 bpm. Exam Location:  Inpatient Procedure: 2D Echo, Cardiac Doppler and Color Doppler Indications:    CHF-Acute Diastolic I50.31  History:        Patient has prior history of Echocardiogram examinations, most                 recent 02/09/2021. CHF; Risk Factors:Hypertension and                 Dyslipidemia. CKD, stage 3.  Sonographer:    Lucendia Herrlich Referring Phys: [id number] CALLIE E GOODRICH IMPRESSIONS  1. Left ventricular ejection fraction, by estimation, is 30 to 35%. The left ventricle has moderately decreased function. The left ventricle demonstrates global hypokinesis. There is mild left ventricular hypertrophy. Left ventricular diastolic parameters are indeterminate.  2. Right  ventricular systolic function is moderately reduced. The right ventricular size is normal.  3. Left atrial size was severely dilated.  4. Right atrial size was severely dilated.  5. Moderate pleural effusion.  6. Trivial mitral valve regurgitation.  7. Tricuspid valve regurgitation is mild to moderate.  8. There is moderate calcification of the aortic valve. Aortic valve regurgitation is mild. Mild aortic valve stenosis.  9. The inferior vena cava is dilated in size with >50% respiratory variability, suggesting right atrial pressure of 8 mmHg. Conclusion(s)/Recommendation(s): EF has declined compared to prior study. FINDINGS  Left Ventricle: Left ventricular ejection fraction, by estimation, is 30 to 35%. The left ventricle has moderately decreased function. The left ventricle demonstrates global hypokinesis. The left ventricular internal cavity size was normal in size. There is mild left ventricular hypertrophy. Abnormal (paradoxical) septal motion, consistent with left bundle branch block. Left ventricular diastolic parameters are indeterminate. Right Ventricle: The right ventricular size is normal. Right ventricular systolic function is moderately reduced. Left Atrium: Left atrial size was severely dilated. Right Atrium: Right atrial size was severely dilated. Pericardium: There is no evidence of pericardial effusion. Mitral Valve: Trivial mitral valve regurgitation. Tricuspid Valve: The tricuspid valve is normal in structure. Tricuspid valve regurgitation is mild to moderate. Aortic Valve: There is moderate calcification of the aortic valve. Aortic valve regurgitation is mild. Aortic regurgitation PHT measures 604 msec. Mild aortic stenosis is present. Aortic valve mean gradient measures 11.7 mmHg. Aortic valve peak gradient measures 20.9 mmHg. Aortic valve area, by VTI  measures 0.48 cm. Pulmonic Valve: Pulmonic valve regurgitation is mild. Aorta: The aortic root and ascending aorta are structurally normal,  with no evidence of dilitation. Venous: The inferior vena cava is dilated in size with greater than 50% respiratory variability, suggesting right atrial pressure of 8 mmHg. IAS/Shunts: No atrial level shunt detected by color flow Doppler. Additional Comments: There is a moderate pleural effusion.  LEFT VENTRICLE PLAX 2D LVIDd:         3.80 cm   Diastology LVIDs:         2.65 cm   LV e' medial:    7.38 cm/s LV PW:         1.10 cm   LV E/e' medial:  19.9 LV IVS:        1.30 cm   LV e' lateral:   12.65 cm/s LVOT diam:     1.60 cm   LV E/e' lateral: 11.6 LV SV:         23 LV SV Index:   11 LVOT Area:     2.01 cm  RIGHT VENTRICLE            IVC RV S prime:     5.59 cm/s  IVC diam: 2.40 cm TAPSE (M-mode): 0.9 cm LEFT ATRIUM              Index        RIGHT ATRIUM           Index LA diam:        4.90 cm  2.38 cm/m   RA Area:     19.00 cm LA Vol (A2C):   83.3 ml  40.47 ml/m  RA Volume:   47.10 ml  22.88 ml/m LA Vol (A4C):   106.0 ml 51.50 ml/m LA Biplane Vol: 97.2 ml  47.22 ml/m  AORTIC VALVE AV Area (Vmax):    0.57 cm AV Area (Vmean):   0.52 cm AV Area (VTI):     0.48 cm AV Vmax:           228.67 cm/s AV Vmean:          159.667 cm/s AV VTI:            0.476 m AV Peak Grad:      20.9 mmHg AV Mean Grad:      11.7 mmHg LVOT Vmax:         64.43 cm/s LVOT Vmean:        41.533 cm/s LVOT VTI:          0.113 m LVOT/AV VTI ratio: 0.24 AI PHT:            604 msec  AORTA Ao Root diam: 3.10 cm Ao Asc diam:  3.30 cm MITRAL VALVE                TRICUSPID VALVE MV Area (PHT): 7.99 cm     TR Peak grad:   50.1 mmHg MV Decel Time: 95 msec      TR Vmax:        354.00 cm/s MR Peak grad: 45.2 mmHg MR Vmax:      336.00 cm/s   SHUNTS MV E velocity: 147.00 cm/s  Systemic VTI:  0.11 m                             Systemic Diam: 1.60 cm Carolan Clines Electronically signed by Carolan Clines Signature Date/Time: 08/30/2023/3:29:24 PM  Final     Cardiac Studies   Echo:    1. Left ventricular ejection fraction, by estimation, is 30 to 35%.  The  left ventricle has moderately decreased function. The left ventricle  demonstrates global hypokinesis. There is mild left ventricular  hypertrophy. Left ventricular diastolic  parameters are indeterminate.   2. Right ventricular systolic function is moderately reduced. The right  ventricular size is normal.   3. Left atrial size was severely dilated.   4. Right atrial size was severely dilated.   5. Moderate pleural effusion.   6. Trivial mitral valve regurgitation.   7. Tricuspid valve regurgitation is mild to moderate.   8. There is moderate calcification of the aortic valve. Aortic valve  regurgitation is mild. Mild aortic valve stenosis.   9. The inferior vena cava is dilated in size with >50% respiratory  variability, suggesting right atrial pressure of 8 mmHg.    Patient Profile     77 y.o. female with history of diastolic heart failure, permanent atrial fibrillation on Eliquis, pericardial effusion status post pericardiocentesis in 2022, hypertension, CKD stage IIIa admitted on 08/28/2023 for acute on chronic diastolic heart failure. Course complicated by hypovolemic hyponatremia.   Assessment & Plan    Acute on chronic diastolic and systolic HF.  Right and left heart cath planned for today.  Seems to be euvolemic.  Will resume Lasix in AM pending exam and labs  AS: Pressures to be assessed as above.    Hyponatremia:  Need to restrict free water.  Na slightly lower today.    AKI:   Creat is down.  Follow post cath.  Limit dye.    HTN:  BP is slightly elevated.  Holding on Norvasc secondary to swelling.  No ACE/ARB/ARNI with CKD.  Unable to tolerate spironolactone in the past.   Continue metroprolol for now and likely start hydral/nitrates before discharge.     Atrial fib:  On Heparin pending cath.  Resume Eliquis post cath.     For questions or updates, please contact [facility] Please consult [email or web address] for contact info under Cardiology/STEMI.    Signed, Rollene Rotunda, MD  09/01/2023, 9:16 AM

## 2023-09-01 NOTE — Progress Notes (Signed)
Progress Note   Patient: [name]:[id number] DOB: [birth year] DOA: 08/28/2023     1 DOS: the patient was seen and examined on 09/01/2023   Brief hospital course: 77yo with h/o HTN, afib-on Eliquis, chronic HFpEF, pericardial effusion s/p pericardiocentesis 01/2021, CKD stage IIIb, HTN, GERD, and class 1 obesity who was recently hospitalized in September for severe hyponatremia (from combination of excessive free water intake, Aldactone), Na++ 127 at dc, was sent on 9/26 from PCP office due to abnormal labs.  + nocturnal hallucinations, some abdominal pain, LE edema, orthopnea. She was admitted for acute on chronic diastolic CHF and treated with Lasix.  Cardiology consulted.  Assessment and Plan:  Acute on chronic HFrEF Patient presented with LE edema and exertional fatigue She was found to have elevated BNP In conjunction with hyponatremia (see below), this was concerning for hypervolemia associated with diastolic CHF exacerbation She felt significantly better on 9/27 - edema resolved, on RA, and able to ambulate up and down the halls without difficulty Discharge considered but cardiology preferred additional diuretics and an updated echo Renal function worsened with diuresis, holding further Lasix and this is improving Unfortunately, echo shows decreased EF to 30-35% with global hypokinesis Cardiology is continuing to consult, now planning for diagnostic cath today given new-onset reduced EF Management per cardiology   Chronic Hyponatremia On review of labs, Na++ was 130-131 in 06/2021  She appears to be at/near current baseline of 126-127, stable and back to baseline Will recheck in AM   AKI on stable 3a CKD, resolved Slightly worsened with diuresis, now back to baseline Repeat BMP in AM   Persistent atrial fibrillation Rate controlled with metoprolol Continue Eliquis - transition to Heparin for cath and then back to Eliquis   Hypertension Continue metoprolol   History of  pericardial effusion  S/p pericardiocentesis in 2022 No frank effusion on imaging today   GERD Resume PPI   HLD Resume pravastatin   Hypothyroidism Resume Synthroid   Obesity Body mass index is 34.23 kg/m.Marland Kitchen  Weight loss should be encouraged Outpatient PCP/bariatric medicine f/u encouraged         Consultants: Cardiology Palliative care [facility] team   Procedures: Echocardiogram RHC/LHC 9/30   Antibiotics: None   30 Day Unplanned Readmission Risk Score    Flowsheet Row ED to Hosp-Admission (Current) from 08/28/2023 in [facility] CARDIAC CATH [facility]  30 Day Unplanned Readmission Risk Score (%) 13.7 Filed at 09/01/2023 1200       This score is the patient's risk of an unplanned readmission within 30 days of being discharged (0 -100%). The score is based on dignosis, age, lab data, medications, orders, and past utilization.   Low:  0-14.9   Medium: 15-21.9   High: 22-29.9   Extreme: 30 and above           Subjective: Feels well, wants to go home - hopefully post-cath.   Objective: Vitals:   09/01/23 1130 09/01/23 1224  BP: (!) 156/104   Pulse: 98   Resp: (!) 28   Temp:    SpO2: 95% 95%    Intake/Output Summary (Last 24 hours) at 09/01/2023 1358 Last data filed at 09/01/2023 0918 Gross per 24 hour  Intake 302.31 ml  Output 252 ml  Net 50.31 ml   Filed Weights   08/30/23 0700 08/31/23 0500 09/01/23 0456  Weight: 97.1 kg 97 kg 97.1 kg    Exam:  General:  Appears calm and comfortable and is in NAD, sitting in  bedside chair, on RA Eyes:  PERRL, EOMI, normal lids, iris ENT:  grossly normal hearing, lips & tongue, mmm Neck:  no LAD, masses or thyromegaly Cardiovascular:  RRR, no m/r/g. Trace LE edema.  Respiratory:   CTA bilaterally with no wheezes/rales/rhonchi.  Normal respiratory effort. Abdomen:  soft, NT, ND Skin:  no rash or induration seen on limited exam Musculoskeletal:  grossly normal tone BUE/BLE, good ROM, no bony  abnormality Psychiatric:  grossly normal mood and affect, speech fluent and appropriate, AOx3 Neurologic:  CN 2-12 grossly intact, moves all extremities in coordinated fashion  Data Reviewed: I have reviewed the patient's lab results since admission.  Pertinent labs for today include:  Na++ 127 BUN 21/Creatinine 1.06/GFR 54, back to baseline Normal CBC      Family Communication: Husband and daughter were at the bedside  Disposition: Status is: Inpatient Remains inpatient appropriate because: cardiology is continuing to provide care     Time spent: 35 minutes  Unresulted Labs (From admission, onward)     Start     Ordered   09/01/23 1530  APTT  Once-Timed,   TIMED       Question:  Specimen collection method  Answer:  Lab=Lab collect   09/01/23 0628   09/01/23 0500  CBC  Daily,   R     Question:  Specimen collection method  Answer:  Lab=Lab collect   08/30/23 2337   09/01/23 0500  Basic metabolic panel  Daily,   R     Question:  Specimen collection method  Answer:  Lab=Lab collect   08/31/23 2152   Signed and Held  Lipoprotein A (LPA)  Tomorrow morning,   R       Question:  Specimen collection method  Answer:  Lab=Lab collect   Signed and Held             Author: Jonah Blue, MD 09/01/2023 1:58 PM  For on call review [email or web address].

## 2023-09-01 NOTE — Interval H&P Note (Signed)
History and Physical Interval Note:  09/01/2023 12:18 PM  [name]  has presented today for surgery, with the diagnosis of Acute on Chronic Combined HF - newly reduced LVEF.  The various methods of treatment have been discussed with the patient and family. After consideration of risks, benefits and other options for treatment, the patient has consented to  Procedure(s): RIGHT/LEFT HEART CATH AND CORONARY ANGIOGRAPHY (N/A) PERCUTANEOUS CORONARY INTERVENTION    as a surgical intervention.  The patient's history has been reviewed, patient examined, no change in status, stable for surgery.  I have reviewed the patient's chart and labs.  Questions were answered to the patient's satisfaction.    Cath Lab Visit (complete for each Cath Lab visit)  Clinical Evaluation Leading to the Procedure:   ACS: No.  Non-ACS:    Anginal Classification: CCS II -I NYHA III CHF  Anti-ischemic medical therapy: Minimal Therapy (1 class of medications)  Non-Invasive Test Results: No non-invasive testing performed  Prior CABG: No previous CABG     Bryan Lemma

## 2023-09-01 NOTE — Progress Notes (Signed)
ANTICOAGULATION CONSULT NOTE  Pharmacy Consult for Heparin (apixaban on hold) Indication: atrial fibrillation  Allergies  Allergen Reactions   Spironolactone Other (See Comments)    Hyponatremia-  the level of sodium in blood is too high   Atorvastatin Other (See Comments)    "Aches"     Patient Measurements: Height: 5\' 6"  (167.6 cm) Weight: 97.1 kg (214 lb 1.1 oz) IBW/kg (Calculated) : 59.3 Heparin Dosing Weight: 81 kg  Vital Signs: Temp: 98 F (36.7 C) (09/30 0439) Temp Source: Oral (09/29 1955) BP: 154/117 (09/30 0439) Pulse Rate: 81 (09/30 0439)  Labs: Recent Labs    08/30/23 0612 08/31/23 0600 08/31/23 1850 09/01/23 0451  HGB 13.9 13.8  --  14.2  HCT 43.3 43.1  --  42.7  PLT 327 295  --  295  APTT  --   --  104* 142*  HEPARINUNFRC  --   --  >1.10* >1.10*  CREATININE 1.48* 1.34*  --  1.06*    Estimated Creatinine Clearance: 51.4 mL/min (A) (by C-G formula based on SCr of 1.06 mg/dL (H)).   Medical History: Past Medical History:  Diagnosis Date   Arthritis    Atrial fibrillation (HCC)    CHF (congestive heart failure) (HCC)    CKD (chronic kidney disease), stage III (HCC)    Colon polyps    Dysrhythmia    new onset afib    Goiter    Hyperglycemia    Hyperlipidemia    Hypertension    Hypothyroidism    Morbid obesity (HCC)    Osteopenia     Medications:  Apixaban 5mg  po BID - LD taken 08/30/23 @ 21:36  Assessment: 77 yr female admitted with fluid overload, hyponatremia, PMH significant for HTN, CHF, CKD, pericardial effusion, afib and on apixaban. Pharmacy consulted to start IV heparin, as apixaban to be held for possible cath Due to recent apixaban use, heparin level will be falsely elevated until effects of apixaban have worn off. Will monitor heparin therapy with aPTT until both levels correlate and then can continue monitor heparin with only heparin levels at that time.  Today, 09/01/23: aPTT = 142 seconds, supratherapeutic with heparin gtt  @ 1100 units/hr Heparin level > 1.1 units/mL, falsely elevated as expected due to recent DOAC CBC WNL RN verified heparin infusing in left arm and confirmed with patient aPTT/heparin level collected from right arm No bleeding or infusion issues noted    Goal of Therapy:  Heparin level 0.3-0.7 units/ml aPTT 66-102 seconds Monitor platelets by anticoagulation protocol: Yes   Plan:  Hold heparin gtt x 1 hr then resume heparin gtt at 900 units/hr Plan for cardiac cath today Check aPTT 8 hr after heparin resumed at decreased rate, if pt not yet gone to cath Daily CBC, heparin level, aPTT   Terrilee Files, PharmD 09/01/2023,6:24 AM

## 2023-09-01 NOTE — H&P (View-Only) (Signed)
Progress Note  Patient Name: [name] Date of Encounter: 09/01/2023  Primary Cardiologist:   Elder Negus, MD   Subjective   Denies pain or SOB.    Inpatient Medications    Scheduled Meds:  cyanocobalamin  1,000 mcg Oral Daily   ferrous sulfate  325 mg Oral q1800   levothyroxine  125 mcg Oral Q0600   metoprolol tartrate  75 mg Oral BID   pantoprazole  40 mg Oral Daily   pravastatin  80 mg Oral Daily   Continuous Infusions:  sodium chloride     heparin 900 Units/hr (09/01/23 0758)   PRN Meds: acetaminophen **OR** acetaminophen   Vital Signs    Vitals:   08/31/23 1955 08/31/23 2021 09/01/23 0439 09/01/23 0456  BP: (!) 153/117 (!) 155/117 (!) 154/117   Pulse: (!) 115 (!) 102 81   Resp:      Temp: 97.9 F (36.6 C) 97.9 F (36.6 C) 98 F (36.7 C)   TempSrc: Oral     SpO2: 99% 96% 95%   Weight:    97.1 kg  Height:        Intake/Output Summary (Last 24 hours) at 09/01/2023 0916 Last data filed at 09/01/2023 0615 Gross per 24 hour  Intake 542.31 ml  Output 253 ml  Net 289.31 ml   Filed Weights   08/30/23 0700 08/31/23 0500 09/01/23 0456  Weight: 97.1 kg 97 kg 97.1 kg    Telemetry    Atrial fib with controlled ventricular rate - Personally Reviewed  ECG     - Personally Reviewed  Physical Exam   GEN: No acute distress.   Neck: No  JVD Cardiac: Irregular RR, 2/6 apical systolic murmur, no diastolic murmurs, rubs, or gallops.  Respiratory: Clear  to auscultation bilaterally. GI: Soft, nontender, non-distended  MS: No  edema; No deformity. Neuro:  Nonfocal  Psych: Normal affect   Labs    Chemistry Recent Labs  Lab 08/28/23 1336 08/29/23 0526 08/30/23 0612 08/31/23 0600 09/01/23 0451  NA 126*   < > 129* 128* 127*  K 3.5   < > 4.8 4.5 3.9  CL 89*   < > 90* 89* 90*  CO2 25   < > 29 29 26   GLUCOSE 113*   < > 102* 93 111*  BUN 19   < > 25* 22 21  CREATININE 1.14*   < > 1.48* 1.34* 1.06*  CALCIUM 9.2   < > 8.8* 8.7* 8.9   PROT 7.0  --   --   --   --   ALBUMIN 4.0  --   --   --   --   AST 20  --   --   --   --   ALT 17  --   --   --   --   ALKPHOS 51  --   --   --   --   BILITOT 1.0  --   --   --   --   GFRNONAA 49*   < > 36* 41* 54*  ANIONGAP 12   < > 10 10 11    < > = values in this interval not displayed.     Hematology Recent Labs  Lab 08/30/23 0612 08/31/23 0600 09/01/23 0451  WBC 9.2 7.7 9.2  RBC 5.15* 5.11 5.08  HGB 13.9 13.8 14.2  HCT 43.3 43.1 42.7  MCV 84.1 84.3 84.1  MCH 27.0 27.0 28.0  MCHC 32.1 32.0 33.3  RDW 14.5 14.6 14.4  PLT 327 295 295    Cardiac EnzymesNo results for input(s): "TROPONINI" in the last 168 hours. No results for input(s): "TROPIPOC" in the last 168 hours.   BNP Recent Labs  Lab 08/28/23 1336  BNP 1,037.6*     DDimer No results for input(s): "DDIMER" in the last 168 hours.   Radiology    ECHOCARDIOGRAM COMPLETE  Result Date: 08/30/2023    ECHOCARDIOGRAM REPORT   Patient Name:   [name] Date of Exam: 08/30/2023 Medical Rec #:  [id number]      Height:       66.0 in Accession #:    [id number]     Weight:       214.1 lb Date of Birth:  11/26/1946       BSA:          2.058 m Patient Age:    6 years       BP:           158/86 mmHg Patient Gender: F              HR:           98 bpm. Exam Location:  Inpatient Procedure: 2D Echo, Cardiac Doppler and Color Doppler Indications:    CHF-Acute Diastolic I50.31  History:        Patient has prior history of Echocardiogram examinations, most                 recent 02/09/2021. CHF; Risk Factors:Hypertension and                 Dyslipidemia. CKD, stage 3.  Sonographer:    Lucendia Herrlich Referring Phys: [id number] CALLIE E GOODRICH IMPRESSIONS  1. Left ventricular ejection fraction, by estimation, is 30 to 35%. The left ventricle has moderately decreased function. The left ventricle demonstrates global hypokinesis. There is mild left ventricular hypertrophy. Left ventricular diastolic parameters are indeterminate.  2. Right  ventricular systolic function is moderately reduced. The right ventricular size is normal.  3. Left atrial size was severely dilated.  4. Right atrial size was severely dilated.  5. Moderate pleural effusion.  6. Trivial mitral valve regurgitation.  7. Tricuspid valve regurgitation is mild to moderate.  8. There is moderate calcification of the aortic valve. Aortic valve regurgitation is mild. Mild aortic valve stenosis.  9. The inferior vena cava is dilated in size with >50% respiratory variability, suggesting right atrial pressure of 8 mmHg. Conclusion(s)/Recommendation(s): EF has declined compared to prior study. FINDINGS  Left Ventricle: Left ventricular ejection fraction, by estimation, is 30 to 35%. The left ventricle has moderately decreased function. The left ventricle demonstrates global hypokinesis. The left ventricular internal cavity size was normal in size. There is mild left ventricular hypertrophy. Abnormal (paradoxical) septal motion, consistent with left bundle branch block. Left ventricular diastolic parameters are indeterminate. Right Ventricle: The right ventricular size is normal. Right ventricular systolic function is moderately reduced. Left Atrium: Left atrial size was severely dilated. Right Atrium: Right atrial size was severely dilated. Pericardium: There is no evidence of pericardial effusion. Mitral Valve: Trivial mitral valve regurgitation. Tricuspid Valve: The tricuspid valve is normal in structure. Tricuspid valve regurgitation is mild to moderate. Aortic Valve: There is moderate calcification of the aortic valve. Aortic valve regurgitation is mild. Aortic regurgitation PHT measures 604 msec. Mild aortic stenosis is present. Aortic valve mean gradient measures 11.7 mmHg. Aortic valve peak gradient measures 20.9 mmHg. Aortic valve area, by VTI  measures 0.48 cm. Pulmonic Valve: Pulmonic valve regurgitation is mild. Aorta: The aortic root and ascending aorta are structurally normal,  with no evidence of dilitation. Venous: The inferior vena cava is dilated in size with greater than 50% respiratory variability, suggesting right atrial pressure of 8 mmHg. IAS/Shunts: No atrial level shunt detected by color flow Doppler. Additional Comments: There is a moderate pleural effusion.  LEFT VENTRICLE PLAX 2D LVIDd:         3.80 cm   Diastology LVIDs:         2.65 cm   LV e' medial:    7.38 cm/s LV PW:         1.10 cm   LV E/e' medial:  19.9 LV IVS:        1.30 cm   LV e' lateral:   12.65 cm/s LVOT diam:     1.60 cm   LV E/e' lateral: 11.6 LV SV:         23 LV SV Index:   11 LVOT Area:     2.01 cm  RIGHT VENTRICLE            IVC RV S prime:     5.59 cm/s  IVC diam: 2.40 cm TAPSE (M-mode): 0.9 cm LEFT ATRIUM              Index        RIGHT ATRIUM           Index LA diam:        4.90 cm  2.38 cm/m   RA Area:     19.00 cm LA Vol (A2C):   83.3 ml  40.47 ml/m  RA Volume:   47.10 ml  22.88 ml/m LA Vol (A4C):   106.0 ml 51.50 ml/m LA Biplane Vol: 97.2 ml  47.22 ml/m  AORTIC VALVE AV Area (Vmax):    0.57 cm AV Area (Vmean):   0.52 cm AV Area (VTI):     0.48 cm AV Vmax:           228.67 cm/s AV Vmean:          159.667 cm/s AV VTI:            0.476 m AV Peak Grad:      20.9 mmHg AV Mean Grad:      11.7 mmHg LVOT Vmax:         64.43 cm/s LVOT Vmean:        41.533 cm/s LVOT VTI:          0.113 m LVOT/AV VTI ratio: 0.24 AI PHT:            604 msec  AORTA Ao Root diam: 3.10 cm Ao Asc diam:  3.30 cm MITRAL VALVE                TRICUSPID VALVE MV Area (PHT): 7.99 cm     TR Peak grad:   50.1 mmHg MV Decel Time: 95 msec      TR Vmax:        354.00 cm/s MR Peak grad: 45.2 mmHg MR Vmax:      336.00 cm/s   SHUNTS MV E velocity: 147.00 cm/s  Systemic VTI:  0.11 m                             Systemic Diam: 1.60 cm Carolan Clines Electronically signed by Carolan Clines Signature Date/Time: 08/30/2023/3:29:24 PM  Final     Cardiac Studies   Echo:    1. Left ventricular ejection fraction, by estimation, is 30 to 35%.  The  left ventricle has moderately decreased function. The left ventricle  demonstrates global hypokinesis. There is mild left ventricular  hypertrophy. Left ventricular diastolic  parameters are indeterminate.   2. Right ventricular systolic function is moderately reduced. The right  ventricular size is normal.   3. Left atrial size was severely dilated.   4. Right atrial size was severely dilated.   5. Moderate pleural effusion.   6. Trivial mitral valve regurgitation.   7. Tricuspid valve regurgitation is mild to moderate.   8. There is moderate calcification of the aortic valve. Aortic valve  regurgitation is mild. Mild aortic valve stenosis.   9. The inferior vena cava is dilated in size with >50% respiratory  variability, suggesting right atrial pressure of 8 mmHg.    Patient Profile     77 y.o. female with history of diastolic heart failure, permanent atrial fibrillation on Eliquis, pericardial effusion status post pericardiocentesis in 2022, hypertension, CKD stage IIIa admitted on 08/28/2023 for acute on chronic diastolic heart failure. Course complicated by hypovolemic hyponatremia.   Assessment & Plan    Acute on chronic diastolic and systolic HF.  Right and left heart cath planned for today.  Seems to be euvolemic.  Will resume Lasix in AM pending exam and labs  AS: Pressures to be assessed as above.    Hyponatremia:  Need to restrict free water.  Na slightly lower today.    AKI:   Creat is down.  Follow post cath.  Limit dye.    HTN:  BP is slightly elevated.  Holding on Norvasc secondary to swelling.  No ACE/ARB/ARNI with CKD.  Unable to tolerate spironolactone in the past.   Continue metroprolol for now and likely start hydral/nitrates before discharge.     Atrial fib:  On Heparin pending cath.  Resume Eliquis post cath.     For questions or updates, please contact [facility] Please consult [email or web address] for contact info under Cardiology/STEMI.    Signed, Rollene Rotunda, MD  09/01/2023, 9:16 AM

## 2023-09-01 NOTE — Progress Notes (Signed)
Mobility Specialist - Progress Note   09/01/23 0937  Mobility  Activity Ambulated with assistance in hallway  Level of Assistance Standby assist, set-up cues, supervision of patient - no hands on  Assistive Device Front wheel walker  Distance Ambulated (ft) 120 ft  Range of Motion/Exercises Active  Activity Response Tolerated well  Mobility Referral Yes  $Mobility charge 1 Mobility  Mobility Specialist Start Time (ACUTE ONLY) 0850  Mobility Specialist Stop Time (ACUTE ONLY) 0900  Mobility Specialist Time Calculation (min) (ACUTE ONLY) 10 min   Pt received in chair and agreed to mobility, had no issues throughout session, returned to chair with all needs met.  Marilynne Halsted Mobility Specialist

## 2023-09-02 ENCOUNTER — Other Ambulatory Visit: Payer: Self-pay | Admitting: Cardiology

## 2023-09-02 ENCOUNTER — Telehealth: Payer: Self-pay

## 2023-09-02 ENCOUNTER — Encounter (HOSPITAL_COMMUNITY): Payer: Self-pay | Admitting: Cardiology

## 2023-09-02 DIAGNOSIS — I5032 Chronic diastolic (congestive) heart failure: Secondary | ICD-10-CM

## 2023-09-02 DIAGNOSIS — I5021 Acute systolic (congestive) heart failure: Secondary | ICD-10-CM | POA: Diagnosis not present

## 2023-09-02 DIAGNOSIS — E66811 Other obesity due to excess calories: Secondary | ICD-10-CM

## 2023-09-02 DIAGNOSIS — I5023 Acute on chronic systolic (congestive) heart failure: Secondary | ICD-10-CM

## 2023-09-02 LAB — BASIC METABOLIC PANEL
Anion gap: 12 (ref 5–15)
BUN: 22 mg/dL (ref 8–23)
CO2: 22 mmol/L (ref 22–32)
Calcium: 8.8 mg/dL — ABNORMAL LOW (ref 8.9–10.3)
Chloride: 90 mmol/L — ABNORMAL LOW (ref 98–111)
Creatinine, Ser: 1.24 mg/dL — ABNORMAL HIGH (ref 0.44–1.00)
GFR, Estimated: 45 mL/min — ABNORMAL LOW (ref >60)
Glucose, Bld: 137 mg/dL — ABNORMAL HIGH (ref 70–99)
Potassium: 3.8 mmol/L (ref 3.5–5.1)
Sodium: 124 mmol/L — ABNORMAL LOW (ref 135–145)

## 2023-09-02 LAB — CBC
HCT: 45.8 % (ref 36.0–46.0)
Hemoglobin: 15.1 g/dL — ABNORMAL HIGH (ref 12.0–15.0)
MCH: 27.1 pg (ref 26.0–34.0)
MCHC: 33 g/dL (ref 30.0–36.0)
MCV: 82.2 fL (ref 80.0–100.0)
Platelets: 299 10*3/uL (ref 150–400)
RBC: 5.57 MIL/uL — ABNORMAL HIGH (ref 3.87–5.11)
RDW: 14.5 % (ref 11.5–15.5)
WBC: 10.6 10*3/uL — ABNORMAL HIGH (ref 4.0–10.5)
nRBC: 0 % (ref 0.0–0.2)

## 2023-09-02 MED ORDER — ISOSORBIDE MONONITRATE ER 30 MG PO TB24
30.0000 mg | ORAL_TABLET | Freq: Every day | ORAL | Status: DC
  Administered 2023-09-02: 30 mg via ORAL
  Filled 2023-09-02: qty 1

## 2023-09-02 MED ORDER — ISOSORBIDE MONONITRATE ER 30 MG PO TB24: 30.0000 mg | ORAL_TABLET | Freq: Every day | ORAL | 1 refills | Status: DC

## 2023-09-02 MED ORDER — HYDRALAZINE HCL 10 MG PO TABS: 10.0000 mg | ORAL_TABLET | Freq: Three times a day (TID) | ORAL | Status: DC

## 2023-09-02 MED ORDER — HYDRALAZINE HCL 10 MG PO TABS: 10.0000 mg | ORAL_TABLET | Freq: Three times a day (TID) | ORAL | 1 refills | Status: DC

## 2023-09-02 NOTE — Progress Notes (Signed)
Ordered BMP per Dr. Jenene Slicker Request- results sent to Dr. Rosemary Holms, patient's primary cardiologist. Lab appointment arranged for 10/7 at 2:15 PM   Jonita Albee, PA-C 09/02/2023 10:41 AM

## 2023-09-02 NOTE — Progress Notes (Signed)
Mobility Specialist - Progress Note   09/02/23 1011  Mobility  Activity Ambulated with assistance in hallway  Level of Assistance Standby assist, set-up cues, supervision of patient - no hands on  Assistive Device Front wheel walker  Distance Ambulated (ft) 240 ft  Range of Motion/Exercises Active  Activity Response Tolerated well  Mobility Referral Yes  $Mobility charge 1 Mobility  Mobility Specialist Start Time (ACUTE ONLY) 0940  Mobility Specialist Stop Time (ACUTE ONLY) 0950  Mobility Specialist Time Calculation (min) (ACUTE ONLY) 10 min   Pt received in chair and agreed to mobility. Pt had no issues throughout session, returned to chair with all needs met and family in room.  Marilynne Halsted Mobility Specialist

## 2023-09-02 NOTE — Discharge Summary (Signed)
mmm Neck:  no LAD, masses or thyromegaly Cardiovascular:  RRR, no m/r/g. Trace LE edema.  Respiratory:   CTA bilaterally with no wheezes/rales/rhonchi.  Normal respiratory effort. Abdomen:  soft, NT, ND Skin:  no rash or induration seen on limited exam Musculoskeletal:  grossly normal tone BUE/BLE, good ROM, no bony abnormality Psychiatric:  grossly normal mood and affect, speech fluent and appropriate, AOx3 Neurologic:  CN 2-12 grossly intact, moves all extremities in coordinated fashion  Data Reviewed: I have reviewed the patient's lab results since admission.  Pertinent labs for today include:  Na++ 124 Glucose 137 BUN 22/Creatinine 1.24/GFR 45; up from 21/1.06/54 on 9/30 WBC 10.6    Condition at discharge: stable  The results of significant diagnostics from this hospitalization (including imaging, microbiology, ancillary and laboratory) are listed below for reference.   Imaging Studies: CARDIAC CATHETERIZATION  Result Date: 09/01/2023 [facility] FINDINGS Angiographically mild to moderate  diffuse disease in the LCx and LAD but not flow-limiting. Right dominant system. Moderate to Severe Secondary Pulmonary Hypertension With Pulmonary Venous Hypertension: PAP-mean 69/34-47 mmHg with PCWP of 33- 35 mmHg and LVp-EDP 162/34-26 mmHg. RAP mean 15 mmHg, RV P-EDP 66/9-15 mmHg Ao sat 94%, PA sat 61%.  Mildly reduced Cardiac Output 4.29, cardiac index 2.09 RECOMMENDATIONS: Patient likely requires additional IV diuresis.  Will return to [facility] for ongoing diuresis . Continue to titrate GDMT for CHF Consider rhythm control for A-fib with bundle branch block. Bryan Lemma, MD  ECHOCARDIOGRAM COMPLETE  Result Date: 08/30/2023    ECHOCARDIOGRAM REPORT   Patient Name:   [name] Date of Exam: 08/30/2023 Medical Rec #:  [id number]      Height:       66.0 in Accession #:    [id number]     Weight:       214.1 lb Date of Birth:  September 24, 1946       BSA:          2.058 m Patient Age:    77 years       BP:           158/86 mmHg Patient Gender: F              HR:           98 bpm. Exam Location:  Inpatient Procedure: 2D Echo, Cardiac Doppler and Color Doppler Indications:    CHF-Acute Diastolic I50.31  History:        Patient has prior history of Echocardiogram examinations, most                 recent 02/09/2021. CHF; Risk Factors:Hypertension and                 Dyslipidemia. CKD, stage 3.  Sonographer:    Lucendia Herrlich Referring Phys: [id number] CALLIE E GOODRICH IMPRESSIONS  1. Left ventricular ejection fraction, by estimation, is 30 to 35%. The left ventricle has moderately decreased function. The left ventricle demonstrates global hypokinesis. There is mild left ventricular hypertrophy. Left ventricular diastolic parameters are indeterminate.  2. Right ventricular systolic function is moderately reduced. The right ventricular size is normal.  3. Left atrial size was severely dilated.  4. Right atrial size was severely dilated.  5. Moderate pleural effusion.  6. Trivial mitral valve regurgitation.   7. Tricuspid valve regurgitation is mild to moderate.  8. There is moderate calcification of the aortic valve. Aortic valve regurgitation is mild. Mild aortic valve stenosis.  9. The inferior vena cava is  Physician Discharge Summary   Patient: [name] MRN: [id number] DOB: 25-Mar-1946  Admit date:     08/28/2023  Discharge date: 09/02/23  Discharge Physician: Jonah Blue   PCP: Creola Corn, MD   Recommendations at discharge:   Resume Lasix on 10/2 Add hydralazine 3 times a day Add Imdur once daily Follow up with cardiology for labs on 10/7 Follow up with cardiology for appointment on 10/23  Discharge Diagnoses: Principal Problem:   Acute on chronic systolic (congestive) heart failure (HCC) Active Problems:   Hyponatremia   Essential hypertension   CKD stage 3b, GFR 30-44 ml/min (HCC)   Pericardial effusion   Permanent atrial fibrillation (HCC)   Hypothyroidism   Aortic valve stenosis   Class 1 obesity due to excess calories with body mass index (BMI) of 33.0 to 33.9 in adult   Hospital Course: 77yo with h/o HTN, afib-on Eliquis, chronic HFpEF, pericardial effusion s/p pericardiocentesis 01/2021, CKD stage IIIb, HTN, GERD, and class 1 obesity who was recently hospitalized in September for severe hyponatremia (from combination of excessive free water intake, Aldactone), Na++ 127 at dc, was sent on 9/26 from PCP office due to abnormal labs.  + nocturnal hallucinations, some abdominal pain, LE edema, orthopnea. She was admitted for acute on chronic diastolic CHF and treated with Lasix.  Cardiology consulted.  Echo with depressed EF, cath performed on 9/30 and appeared to show persistent volume overload.  She was sensitive to diuresis and developed AKI during the hospitalization associated with diuresis so will need ongoing close monitoring of volume status.  Assessment and Plan:  Acute on chronic HFrEF Patient presented with LE edema and exertional fatigue She was found to have elevated BNP In conjunction with hyponatremia (see below), this was concerning for hypervolemia associated with diastolic CHF exacerbation She felt significantly better on 9/27 - edema resolved, on RA,  and able to ambulate up and down the halls without difficulty Discharge considered but cardiology preferred additional diuretics and an updated echo Renal function worsened with diuresis, holding further Lasix and this is improving Unfortunately, echo shows decreased EF to 30-35% with global hypokinesis Cardiology is continuing to consult, now planning for diagnostic cath today given new-onset reduced EF Management per cardiology, had more diuresis overnight She is insistent about dc today and is willing to make medication changes at home   Chronic Hyponatremia On review of labs, Na++ was 130-131 in 06/2021  She appears to be at/near current baseline of 126-127, stable and back to baseline yesterday but slightly worse today    AKI on stable 3a CKD, resolved Slightly worsened with diuresis, now back to baseline Repeat BMP in a few days with PCP   Persistent atrial fibrillation Rate controlled with metoprolol Continue Eliquis - transitioned to Heparin for cath and then back to Eliquis   Hypertension Continue metoprolol   History of pericardial effusion  S/p pericardiocentesis in 2022 No frank effusion on imaging now GERD Resume PPI   HLD Resume pravastatin   Hypothyroidism Resume Synthroid   Obesity Body mass index is 34.23 kg/m.Marland Kitchen  Weight loss should be encouraged Outpatient PCP/bariatric medicine f/u encouraged         Consultants: Cardiology Palliative care [facility] team   Procedures: Echocardiogram RHC/LHC 9/30   Antibiotics: None    30 Day Unplanned Readmission Risk Score    Flowsheet Row ED to Hosp-Admission (Current) from 08/28/2023 in [facility]  30 Day Unplanned Readmission Risk Score (%) 14.18 Filed at  Physician Discharge Summary   Patient: [name] MRN: [id number] DOB: 25-Mar-1946  Admit date:     08/28/2023  Discharge date: 09/02/23  Discharge Physician: Jonah Blue   PCP: Creola Corn, MD   Recommendations at discharge:   Resume Lasix on 10/2 Add hydralazine 3 times a day Add Imdur once daily Follow up with cardiology for labs on 10/7 Follow up with cardiology for appointment on 10/23  Discharge Diagnoses: Principal Problem:   Acute on chronic systolic (congestive) heart failure (HCC) Active Problems:   Hyponatremia   Essential hypertension   CKD stage 3b, GFR 30-44 ml/min (HCC)   Pericardial effusion   Permanent atrial fibrillation (HCC)   Hypothyroidism   Aortic valve stenosis   Class 1 obesity due to excess calories with body mass index (BMI) of 33.0 to 33.9 in adult   Hospital Course: 77yo with h/o HTN, afib-on Eliquis, chronic HFpEF, pericardial effusion s/p pericardiocentesis 01/2021, CKD stage IIIb, HTN, GERD, and class 1 obesity who was recently hospitalized in September for severe hyponatremia (from combination of excessive free water intake, Aldactone), Na++ 127 at dc, was sent on 9/26 from PCP office due to abnormal labs.  + nocturnal hallucinations, some abdominal pain, LE edema, orthopnea. She was admitted for acute on chronic diastolic CHF and treated with Lasix.  Cardiology consulted.  Echo with depressed EF, cath performed on 9/30 and appeared to show persistent volume overload.  She was sensitive to diuresis and developed AKI during the hospitalization associated with diuresis so will need ongoing close monitoring of volume status.  Assessment and Plan:  Acute on chronic HFrEF Patient presented with LE edema and exertional fatigue She was found to have elevated BNP In conjunction with hyponatremia (see below), this was concerning for hypervolemia associated with diastolic CHF exacerbation She felt significantly better on 9/27 - edema resolved, on RA,  and able to ambulate up and down the halls without difficulty Discharge considered but cardiology preferred additional diuretics and an updated echo Renal function worsened with diuresis, holding further Lasix and this is improving Unfortunately, echo shows decreased EF to 30-35% with global hypokinesis Cardiology is continuing to consult, now planning for diagnostic cath today given new-onset reduced EF Management per cardiology, had more diuresis overnight She is insistent about dc today and is willing to make medication changes at home   Chronic Hyponatremia On review of labs, Na++ was 130-131 in 06/2021  She appears to be at/near current baseline of 126-127, stable and back to baseline yesterday but slightly worse today    AKI on stable 3a CKD, resolved Slightly worsened with diuresis, now back to baseline Repeat BMP in a few days with PCP   Persistent atrial fibrillation Rate controlled with metoprolol Continue Eliquis - transitioned to Heparin for cath and then back to Eliquis   Hypertension Continue metoprolol   History of pericardial effusion  S/p pericardiocentesis in 2022 No frank effusion on imaging now GERD Resume PPI   HLD Resume pravastatin   Hypothyroidism Resume Synthroid   Obesity Body mass index is 34.23 kg/m.Marland Kitchen  Weight loss should be encouraged Outpatient PCP/bariatric medicine f/u encouraged         Consultants: Cardiology Palliative care [facility] team   Procedures: Echocardiogram RHC/LHC 9/30   Antibiotics: None    30 Day Unplanned Readmission Risk Score    Flowsheet Row ED to Hosp-Admission (Current) from 08/28/2023 in [facility]  30 Day Unplanned Readmission Risk Score (%) 14.18 Filed at  Physician Discharge Summary   Patient: [name] MRN: [id number] DOB: 25-Mar-1946  Admit date:     08/28/2023  Discharge date: 09/02/23  Discharge Physician: Jonah Blue   PCP: Creola Corn, MD   Recommendations at discharge:   Resume Lasix on 10/2 Add hydralazine 3 times a day Add Imdur once daily Follow up with cardiology for labs on 10/7 Follow up with cardiology for appointment on 10/23  Discharge Diagnoses: Principal Problem:   Acute on chronic systolic (congestive) heart failure (HCC) Active Problems:   Hyponatremia   Essential hypertension   CKD stage 3b, GFR 30-44 ml/min (HCC)   Pericardial effusion   Permanent atrial fibrillation (HCC)   Hypothyroidism   Aortic valve stenosis   Class 1 obesity due to excess calories with body mass index (BMI) of 33.0 to 33.9 in adult   Hospital Course: 77yo with h/o HTN, afib-on Eliquis, chronic HFpEF, pericardial effusion s/p pericardiocentesis 01/2021, CKD stage IIIb, HTN, GERD, and class 1 obesity who was recently hospitalized in September for severe hyponatremia (from combination of excessive free water intake, Aldactone), Na++ 127 at dc, was sent on 9/26 from PCP office due to abnormal labs.  + nocturnal hallucinations, some abdominal pain, LE edema, orthopnea. She was admitted for acute on chronic diastolic CHF and treated with Lasix.  Cardiology consulted.  Echo with depressed EF, cath performed on 9/30 and appeared to show persistent volume overload.  She was sensitive to diuresis and developed AKI during the hospitalization associated with diuresis so will need ongoing close monitoring of volume status.  Assessment and Plan:  Acute on chronic HFrEF Patient presented with LE edema and exertional fatigue She was found to have elevated BNP In conjunction with hyponatremia (see below), this was concerning for hypervolemia associated with diastolic CHF exacerbation She felt significantly better on 9/27 - edema resolved, on RA,  and able to ambulate up and down the halls without difficulty Discharge considered but cardiology preferred additional diuretics and an updated echo Renal function worsened with diuresis, holding further Lasix and this is improving Unfortunately, echo shows decreased EF to 30-35% with global hypokinesis Cardiology is continuing to consult, now planning for diagnostic cath today given new-onset reduced EF Management per cardiology, had more diuresis overnight She is insistent about dc today and is willing to make medication changes at home   Chronic Hyponatremia On review of labs, Na++ was 130-131 in 06/2021  She appears to be at/near current baseline of 126-127, stable and back to baseline yesterday but slightly worse today    AKI on stable 3a CKD, resolved Slightly worsened with diuresis, now back to baseline Repeat BMP in a few days with PCP   Persistent atrial fibrillation Rate controlled with metoprolol Continue Eliquis - transitioned to Heparin for cath and then back to Eliquis   Hypertension Continue metoprolol   History of pericardial effusion  S/p pericardiocentesis in 2022 No frank effusion on imaging now GERD Resume PPI   HLD Resume pravastatin   Hypothyroidism Resume Synthroid   Obesity Body mass index is 34.23 kg/m.Marland Kitchen  Weight loss should be encouraged Outpatient PCP/bariatric medicine f/u encouraged         Consultants: Cardiology Palliative care [facility] team   Procedures: Echocardiogram RHC/LHC 9/30   Antibiotics: None    30 Day Unplanned Readmission Risk Score    Flowsheet Row ED to Hosp-Admission (Current) from 08/28/2023 in [facility]  30 Day Unplanned Readmission Risk Score (%) 14.18 Filed at  mmm Neck:  no LAD, masses or thyromegaly Cardiovascular:  RRR, no m/r/g. Trace LE edema.  Respiratory:   CTA bilaterally with no wheezes/rales/rhonchi.  Normal respiratory effort. Abdomen:  soft, NT, ND Skin:  no rash or induration seen on limited exam Musculoskeletal:  grossly normal tone BUE/BLE, good ROM, no bony abnormality Psychiatric:  grossly normal mood and affect, speech fluent and appropriate, AOx3 Neurologic:  CN 2-12 grossly intact, moves all extremities in coordinated fashion  Data Reviewed: I have reviewed the patient's lab results since admission.  Pertinent labs for today include:  Na++ 124 Glucose 137 BUN 22/Creatinine 1.24/GFR 45; up from 21/1.06/54 on 9/30 WBC 10.6    Condition at discharge: stable  The results of significant diagnostics from this hospitalization (including imaging, microbiology, ancillary and laboratory) are listed below for reference.   Imaging Studies: CARDIAC CATHETERIZATION  Result Date: 09/01/2023 [facility] FINDINGS Angiographically mild to moderate  diffuse disease in the LCx and LAD but not flow-limiting. Right dominant system. Moderate to Severe Secondary Pulmonary Hypertension With Pulmonary Venous Hypertension: PAP-mean 69/34-47 mmHg with PCWP of 33- 35 mmHg and LVp-EDP 162/34-26 mmHg. RAP mean 15 mmHg, RV P-EDP 66/9-15 mmHg Ao sat 94%, PA sat 61%.  Mildly reduced Cardiac Output 4.29, cardiac index 2.09 RECOMMENDATIONS: Patient likely requires additional IV diuresis.  Will return to [facility] for ongoing diuresis . Continue to titrate GDMT for CHF Consider rhythm control for A-fib with bundle branch block. Bryan Lemma, MD  ECHOCARDIOGRAM COMPLETE  Result Date: 08/30/2023    ECHOCARDIOGRAM REPORT   Patient Name:   [name] Date of Exam: 08/30/2023 Medical Rec #:  [id number]      Height:       66.0 in Accession #:    [id number]     Weight:       214.1 lb Date of Birth:  September 24, 1946       BSA:          2.058 m Patient Age:    77 years       BP:           158/86 mmHg Patient Gender: F              HR:           98 bpm. Exam Location:  Inpatient Procedure: 2D Echo, Cardiac Doppler and Color Doppler Indications:    CHF-Acute Diastolic I50.31  History:        Patient has prior history of Echocardiogram examinations, most                 recent 02/09/2021. CHF; Risk Factors:Hypertension and                 Dyslipidemia. CKD, stage 3.  Sonographer:    Lucendia Herrlich Referring Phys: [id number] CALLIE E GOODRICH IMPRESSIONS  1. Left ventricular ejection fraction, by estimation, is 30 to 35%. The left ventricle has moderately decreased function. The left ventricle demonstrates global hypokinesis. There is mild left ventricular hypertrophy. Left ventricular diastolic parameters are indeterminate.  2. Right ventricular systolic function is moderately reduced. The right ventricular size is normal.  3. Left atrial size was severely dilated.  4. Right atrial size was severely dilated.  5. Moderate pleural effusion.  6. Trivial mitral valve regurgitation.   7. Tricuspid valve regurgitation is mild to moderate.  8. There is moderate calcification of the aortic valve. Aortic valve regurgitation is mild. Mild aortic valve stenosis.  9. The inferior vena cava is  Physician Discharge Summary   Patient: [name] MRN: [id number] DOB: 25-Mar-1946  Admit date:     08/28/2023  Discharge date: 09/02/23  Discharge Physician: Jonah Blue   PCP: Creola Corn, MD   Recommendations at discharge:   Resume Lasix on 10/2 Add hydralazine 3 times a day Add Imdur once daily Follow up with cardiology for labs on 10/7 Follow up with cardiology for appointment on 10/23  Discharge Diagnoses: Principal Problem:   Acute on chronic systolic (congestive) heart failure (HCC) Active Problems:   Hyponatremia   Essential hypertension   CKD stage 3b, GFR 30-44 ml/min (HCC)   Pericardial effusion   Permanent atrial fibrillation (HCC)   Hypothyroidism   Aortic valve stenosis   Class 1 obesity due to excess calories with body mass index (BMI) of 33.0 to 33.9 in adult   Hospital Course: 77yo with h/o HTN, afib-on Eliquis, chronic HFpEF, pericardial effusion s/p pericardiocentesis 01/2021, CKD stage IIIb, HTN, GERD, and class 1 obesity who was recently hospitalized in September for severe hyponatremia (from combination of excessive free water intake, Aldactone), Na++ 127 at dc, was sent on 9/26 from PCP office due to abnormal labs.  + nocturnal hallucinations, some abdominal pain, LE edema, orthopnea. She was admitted for acute on chronic diastolic CHF and treated with Lasix.  Cardiology consulted.  Echo with depressed EF, cath performed on 9/30 and appeared to show persistent volume overload.  She was sensitive to diuresis and developed AKI during the hospitalization associated with diuresis so will need ongoing close monitoring of volume status.  Assessment and Plan:  Acute on chronic HFrEF Patient presented with LE edema and exertional fatigue She was found to have elevated BNP In conjunction with hyponatremia (see below), this was concerning for hypervolemia associated with diastolic CHF exacerbation She felt significantly better on 9/27 - edema resolved, on RA,  and able to ambulate up and down the halls without difficulty Discharge considered but cardiology preferred additional diuretics and an updated echo Renal function worsened with diuresis, holding further Lasix and this is improving Unfortunately, echo shows decreased EF to 30-35% with global hypokinesis Cardiology is continuing to consult, now planning for diagnostic cath today given new-onset reduced EF Management per cardiology, had more diuresis overnight She is insistent about dc today and is willing to make medication changes at home   Chronic Hyponatremia On review of labs, Na++ was 130-131 in 06/2021  She appears to be at/near current baseline of 126-127, stable and back to baseline yesterday but slightly worse today    AKI on stable 3a CKD, resolved Slightly worsened with diuresis, now back to baseline Repeat BMP in a few days with PCP   Persistent atrial fibrillation Rate controlled with metoprolol Continue Eliquis - transitioned to Heparin for cath and then back to Eliquis   Hypertension Continue metoprolol   History of pericardial effusion  S/p pericardiocentesis in 2022 No frank effusion on imaging now GERD Resume PPI   HLD Resume pravastatin   Hypothyroidism Resume Synthroid   Obesity Body mass index is 34.23 kg/m.Marland Kitchen  Weight loss should be encouraged Outpatient PCP/bariatric medicine f/u encouraged         Consultants: Cardiology Palliative care [facility] team   Procedures: Echocardiogram RHC/LHC 9/30   Antibiotics: None    30 Day Unplanned Readmission Risk Score    Flowsheet Row ED to Hosp-Admission (Current) from 08/28/2023 in [facility]  30 Day Unplanned Readmission Risk Score (%) 14.18 Filed at

## 2023-09-02 NOTE — Telephone Encounter (Signed)
Will need TOC call on 10/2. Has hospital follow up with APP on 09/24/23.

## 2023-09-02 NOTE — Plan of Care (Signed)

## 2023-09-02 NOTE — Progress Notes (Signed)
Progress Note  Patient Name: [name] Date of Encounter: 09/02/2023  Primary Cardiologist:   Elder Negus, MD   Subjective   No pain or SOB.  Very much wants to go home.   Inpatient Medications    Scheduled Meds:  apixaban  5 mg Oral BID   cyanocobalamin  1,000 mcg Oral Daily   ferrous sulfate  325 mg Oral q1800   levothyroxine  125 mcg Oral Q0600   metoprolol tartrate  75 mg Oral BID   pantoprazole  40 mg Oral Daily   pravastatin  80 mg Oral Daily   sodium chloride flush  3 mL Intravenous Q12H   Continuous Infusions:  sodium chloride     PRN Meds: sodium chloride, acetaminophen **OR** acetaminophen, sodium chloride flush   Vital Signs    Vitals:   09/01/23 1721 09/01/23 2054 09/01/23 2334 09/02/23 0500  BP: (!) 149/98 (!) 138/100 (!) 141/99   Pulse: (!) 104  88   Resp:   20   Temp:   98.1 F (36.7 C)   TempSrc:   Oral   SpO2: 96%  96%   Weight:    95 kg  Height:        Intake/Output Summary (Last 24 hours) at 09/02/2023 0852 Last data filed at 09/02/2023 0400 Gross per 24 hour  Intake 587.13 ml  Output 1050 ml  Net -462.87 ml   Filed Weights   08/31/23 0500 09/01/23 0456 09/02/23 0500  Weight: 97 kg 97.1 kg 95 kg    Telemetry    Atrial fib with controlled ventricular rate - Personally Reviewed  ECG     - Personally Reviewed  Physical Exam   GEN: No acute distress.   Neck: No  JVD Cardiac: Irregular RR, 2/6 apical systolic murmur, no diastolic murmurs, rubs, or gallops.  Respiratory: Clear  to auscultation bilaterally. GI: Soft, nontender, non-distended  MS: No  edema; No deformity. Neuro:  Nonfocal  Psych: Normal affect   Labs    Chemistry Recent Labs  Lab 08/28/23 1336 08/29/23 0526 08/30/23 0612 08/31/23 0600 09/01/23 0451 09/01/23 1248 09/01/23 1257 09/01/23 1258  NA 126*   < > 129* 128* 127* 126* 126* 126*  K 3.5   < > 4.8 4.5 3.9 4.2 4.1 4.1  CL 89*   < > 90* 89* 90*  --   --   --   CO2 25   < > 29 29 26    --   --   --   GLUCOSE 113*   < > 102* 93 111*  --   --   --   BUN 19   < > 25* 22 21  --   --   --   CREATININE 1.14*   < > 1.48* 1.34* 1.06*  --   --   --   CALCIUM 9.2   < > 8.8* 8.7* 8.9  --   --   --   PROT 7.0  --   --   --   --   --   --   --   ALBUMIN 4.0  --   --   --   --   --   --   --   AST 20  --   --   --   --   --   --   --   ALT 17  --   --   --   --   --   --   --  ALKPHOS 51  --   --   --   --   --   --   --   BILITOT 1.0  --   --   --   --   --   --   --   GFRNONAA 49*   < > 36* 41* 54*  --   --   --   ANIONGAP 12   < > 10 10 11   --   --   --    < > = values in this interval not displayed.     Hematology Recent Labs  Lab 08/31/23 0600 09/01/23 0451 09/01/23 1248 09/01/23 1257 09/01/23 1258 09/02/23 0604  WBC 7.7 9.2  --   --   --  10.6*  RBC 5.11 5.08  --   --   --  5.57*  HGB 13.8 14.2   < > 15.6* 15.3* 15.1*  HCT 43.1 42.7   < > 46.0 45.0 45.8  MCV 84.3 84.1  --   --   --  82.2  MCH 27.0 28.0  --   --   --  27.1  MCHC 32.0 33.3  --   --   --  33.0  RDW 14.6 14.4  --   --   --  14.5  PLT 295 295  --   --   --  299   < > = values in this interval not displayed.    Cardiac EnzymesNo results for input(s): "TROPONINI" in the last 168 hours. No results for input(s): "TROPIPOC" in the last 168 hours.   BNP Recent Labs  Lab 08/28/23 1336  BNP 1,037.6*     DDimer No results for input(s): "DDIMER" in the last 168 hours.   Radiology    CARDIAC CATHETERIZATION  Result Date: 09/01/2023 [facility] FINDINGS Angiographically mild to moderate diffuse disease in the LCx and LAD but not flow-limiting. Right dominant system. Moderate to Severe Secondary Pulmonary Hypertension With Pulmonary Venous Hypertension: PAP-mean 69/34-47 mmHg with PCWP of 33- 35 mmHg and LVp-EDP 162/34-26 mmHg. RAP mean 15 mmHg, RV P-EDP 66/9-15 mmHg Ao sat 94%, PA sat 61%.  Mildly reduced Cardiac Output 4.29, cardiac index 2.09 RECOMMENDATIONS: Patient likely requires additional IV  diuresis.  Will return to [facility] for ongoing diuresis . Continue to titrate GDMT for CHF Consider rhythm control for A-fib with bundle branch block. Bryan Lemma, MD   Cardiac Studies    Cath:  Diagnostic Dominance: Right  Intervention  Moderate to Severe Secondary Pulmonary Hypertension With Pulmonary Venous Hypertension:  PAP-mean 69/34-47 mmHg with PCWP of 33- 35 mmHg and LVp-EDP 162/34-26 mmHg.  RAP mean 15 mmHg, RV P-EDP 66/9-15 mmHg Ao sat 94%, PA sat 61%.   Mildly reduced Cardiac Output 4.29, cardiac index 2.09  Echo:   1. Left ventricular ejection fraction, by estimation, is 30 to 35%. The  left ventricle has moderately decreased function. The left ventricle  demonstrates global hypokinesis. There is mild left ventricular  hypertrophy. Left ventricular diastolic  parameters are indeterminate.   2. Right ventricular systolic function is moderately reduced. The right  ventricular size is normal.   3. Left atrial size was severely dilated.   4. Right atrial size was severely dilated.   5. Moderate pleural effusion.   6. Trivial mitral valve regurgitation.   7. Tricuspid valve regurgitation is mild to moderate.   8. There is moderate calcification of the aortic valve. Aortic valve  regurgitation is mild. Mild aortic valve stenosis.  9. The inferior vena cava is dilated in size with >50% respiratory  variability, suggesting right atrial pressure of 8 mmHg.    Patient Profile     77 y.o. female with history of diastolic heart failure, permanent atrial fibrillation on Eliquis, pericardial effusion status post pericardiocentesis in 2022, hypertension, CKD stage IIIa admitted on 08/28/2023 for acute on chronic diastolic heart failure. Course complicated by hypovolemic hyponatremia.   Assessment & Plan    Acute on chronic diastolic and systolic HF.  Right and left heart cath with elevated pressures as above.  Given IV diuretic.  Net negative 5.4 but likely  incomplete recording overnight.  Ok to send home but will need follow up labs this week.  Needs free water restriction and low Na diet.   BMET pending this morning.   I think she will need to be back on 20 mg Lasix daily PO.   AS: This was mild on echo.  We will follow this clinically.  I did look back at 2022 and there was no significant gradient at that time.    Hyponatremia:  Need to restrict free water.  Discharge pending BMET today.   AKI:   Creat was down yesterday and repeat is pending. Marland Kitchen   HTN:  BP is slightly elevated.  Holding on Norvasc secondary to swelling.  No ACE/ARB/ARNI with CKD.  Unable to tolerate spironolactone in the past.   Continue metroprolol.  Will add hydralazine nitrates.     Atrial fib:  On Heparin pending cath.  Resume Eliquis post cath.     For questions or updates, please contact [facility] Please consult [email or web address] for contact info under Cardiology/STEMI.   Signed, Rollene Rotunda, MD  09/02/2023, 8:52 AM

## 2023-09-02 NOTE — Telephone Encounter (Signed)
Per Dr Rosemary Holms...   She was discharged today after HFrEF admission. Can you check if she has an appt? If not, I can do a TOC visit on my DOD day 10.9 if that's okay. TY   Will send to the [facility] pool... pt D/C 09/02/23.

## 2023-09-03 ENCOUNTER — Telehealth: Payer: Self-pay | Admitting: *Deleted

## 2023-09-03 ENCOUNTER — Telehealth: Payer: Self-pay

## 2023-09-03 DIAGNOSIS — I509 Heart failure, unspecified: Secondary | ICD-10-CM

## 2023-09-03 DIAGNOSIS — E871 Hypo-osmolality and hyponatremia: Secondary | ICD-10-CM

## 2023-09-03 NOTE — Telephone Encounter (Signed)
Patient is returning call.  °

## 2023-09-03 NOTE — Telephone Encounter (Signed)
I left a message for the pt to call back... per Dr Rosemary Holms we can move her [facility] appt with him next week on his DOD day and cancel her Tereso Newcomer PA and the appt with him in March.

## 2023-09-03 NOTE — Consult Note (Signed)
[facility]  [facility] Inpatient Consult   09/03/2023  [name] [birth year] [id number]  [facility] [[facility]]  Accountable Care Organization [ACO] Patient: [name]  Primary Care Provider: Creola Corn, MD with [facility], provider is listed for the transition of care follow up appointments    [facility] Liaison screened the patient remotely at [facility].    The patient was screened for 30 day readmission hospitalization with noted medium risk score for unplanned readmission risk 2 hospital admissions in 6 months. Reviewed for HF with new decrease in EF.  The patient was assessed for potential [facility]) Care Management service needs for post hospital transition for care coordination. Review of patient's electronic medical record reveals patient is for home today.   Plan: [facility] Liaison reviewed for needs.  Referral request for community care coordination: will make referral for HF exacerbation follow up   [facility] Management/Population Health does not replace or interfere with any arrangements made by the Inpatient Transition of Care team.   For questions contact:   Charlesetta Shanks, RN, BSN, CCM [place]  [facility], [facility] Health [facility] Liaison Direct Dial: [phone] or secure chat Website: [email or web address]

## 2023-09-03 NOTE — Progress Notes (Signed)
Care Coordination   Note   09/03/2023 Name: [name] MRN: [id number] DOB: 1946-01-20  [name] is a 77 y.o. year old female who sees Creola Corn, MD for primary care. I reached out to [employer] by phone today to offer care coordination services.  [name] was given information about Care Coordination services today including:   The Care Coordination services include support from the care team which includes your Nurse Coordinator, Clinical Social Worker, or Pharmacist.  The Care Coordination team is here to help remove barriers to the health concerns and goals most important to you. Care Coordination services are voluntary, and the patient may decline or stop services at any time by request to their care team member.   Care Coordination Consent Status: Patient agreed to services and verbal consent obtained.   Follow up plan:  Telephone appointment with care coordination team member scheduled for:  09/15/23  Encounter Outcome:  Patient Scheduled  [facility] Coordination Care Guide  Direct Dial: [phone]

## 2023-09-03 NOTE — Telephone Encounter (Signed)
Transition Care Management Follow-up Telephone Call Date of discharge and from where: [name] 09/02/2023 How have you been since you were released from the hospital? Pt states she had the best night's sleep last night she has ever had. Any questions or concerns? No  Items Reviewed: Did the pt receive and understand the discharge instructions provided? Yes  Medications obtained and verified? Yes  Other? No  Any new allergies since your discharge? No  Dietary orders reviewed? Yes Do you have support at home? Yes   Home Care and Equipment/Supplies: Were home health services ordered? not applicable   Functional Questionnaire: (I = Independent and D = Dependent) ADLs: I  Bathing/Dressing- I  Meal Prep- I  Eating- I  Maintaining continence- I  Transferring/Ambulation- I  Managing Meds- I  Follow up appointments reviewed:  PCP Hospital f/u appt confirmed? Yes  Scheduled to see Dr Timothy Lasso on 09/09/2023  Specialist Hospital f/u appt confirmed? Yes  Scheduled to see Tereso Newcomer 09/24/2023  Are transportation arrangements needed? No  If their condition worsens, is the pt aware to call PCP or go to the Emergency Dept.? Yes Was the patient provided with contact information for the PCP's office or ED? No Was to pt encouraged to call back with questions or concerns? Yes

## 2023-09-04 LAB — LIPOPROTEIN A (LPA): Lipoprotein (a): 67 nmol/L — ABNORMAL HIGH (ref <75.0)

## 2023-09-05 ENCOUNTER — Other Ambulatory Visit: Payer: Self-pay | Admitting: Cardiology

## 2023-09-08 ENCOUNTER — Other Ambulatory Visit: Payer: Medicare HMO

## 2023-09-09 DIAGNOSIS — I10 Essential (primary) hypertension: Secondary | ICD-10-CM | POA: Diagnosis not present

## 2023-09-09 DIAGNOSIS — E871 Hypo-osmolality and hyponatremia: Secondary | ICD-10-CM | POA: Diagnosis not present

## 2023-09-10 ENCOUNTER — Encounter: Payer: Self-pay | Admitting: Cardiology

## 2023-09-10 ENCOUNTER — Ambulatory Visit: Payer: Medicare HMO | Attending: Physician Assistant | Admitting: Cardiology

## 2023-09-10 VITALS — BP 130/90 | HR 87 | Ht 66.0 in | Wt 214.8 lb

## 2023-09-10 DIAGNOSIS — I5023 Acute on chronic systolic (congestive) heart failure: Secondary | ICD-10-CM | POA: Diagnosis not present

## 2023-09-10 DIAGNOSIS — M7989 Other specified soft tissue disorders: Secondary | ICD-10-CM

## 2023-09-10 DIAGNOSIS — I10 Essential (primary) hypertension: Secondary | ICD-10-CM

## 2023-09-10 DIAGNOSIS — I4819 Other persistent atrial fibrillation: Secondary | ICD-10-CM

## 2023-09-10 MED ORDER — METOPROLOL SUCCINATE ER 100 MG PO TB24
100.0000 mg | ORAL_TABLET | Freq: Every day | ORAL | 3 refills | Status: DC
Start: 2023-09-10 — End: 2024-08-25

## 2023-09-10 MED ORDER — FUROSEMIDE 20 MG PO TABS
20.0000 mg | ORAL_TABLET | Freq: Two times a day (BID) | ORAL | 3 refills | Status: DC
Start: 2023-09-10 — End: 2023-09-25

## 2023-09-10 NOTE — Progress Notes (Signed)
Cardiology Office Note:  .   Date:  09/10/2023  ID:  [name], DOB Jun 02, 1946, MRN [id number] PCP: Creola Corn, MD  [facility] Providers Cardiologist:  Truett Mainland, MD PCP: Creola Corn, MD  Chief Complaint  Patient presents with   HFrEF    TOC visit      History of Present Illness: .    [name] is a 77 y.o. female with hypertension, persistent atrial fibrillation, moderate obesity, pericardial effusion s/p pericardiocentesis (01/2021), new diagnosis HFrEF, severe WHO Grp II PH (08/2023)  Patient recently had recurrent hospitalization with shortness of breath, hyponatremia.  She was found to have new diagnosis of HFrEF, with EF of 30-35%, nonischemic cardiomyopathy, severe pulmonary hypertension with mPAP 47 mmHg, WHO group 2 with LVEDP of 26 mmHg.  Hyponatremia was thought to be delusional in the setting of heart failure.  She was diuresed with improvement in her symptoms of dyspnea and leg edema.  Patient is here with her daughter today.  She denies any overt dyspnea symptoms, but has noticed leg swelling today.  Reportedly, her weight was up by 1 pound today.  She is not tolerating hydralazine due to nausea.  She is compliant with all her rest of medical therapy.  She is watching salt intake.  Vitals:   09/10/23 1432  BP: (!) 130/90  Pulse: 87  SpO2: 97%     ROS:  Review of Systems  Cardiovascular:  Positive for leg swelling. Negative for chest pain, dyspnea on exertion, palpitations and syncope.     Studies Reviewed: .       09/09/2023: Glucose 100, BUN/Cr 18/1.22. EGFR 45. Na/K 125/4.8.  H/H 14/44. MCV 84. Platelets 315  EKG 09/10/2023: Atrial fibrillation Left bundle branch block When compared with ECG of 10-Sep-2023 14:48, No significant change was found  Labs Sep-Oct 2024: Na remains 124-129, most recent 124 on 09/02/2023    Right and left heart cath 09/01/2023: Angiographically mild to moderate diffuse disease in the LCx and LAD but  not flow-limiting. Right dominant system. Moderate to Severe Secondary Pulmonary Hypertension With Pulmonary Venous Hypertension:  PAP-mean 69/34-47 mmHg with PCWP of 33- 35 mmHg and LVp-EDP 162/34-26 mmHg.  RAP mean 15 mmHg, RV P-EDP 66/9-15 mmHg Ao sat 94%, PA sat 61%.   Mildly reduced Cardiac Output 4.29, cardiac index 2.09     RECOMMENDATIONS: Patient likely requires additional IV diuresis.  Will return to [facility] long hospital for ongoing diuresis . Continue to titrate GDMT for CHF Consider rhythm control for A-fib with bundle branch block.     Echocardiogram 09/10/2023: Left ventricular ejection fraction, by estimation, is 30 to 35%. The  left ventricle has moderately decreased function. The left ventricle  demonstrates global hypokinesis. There is mild left ventricular  hypertrophy. Left ventricular diastolic parameters are indeterminate.   2. Right ventricular systolic function is moderately reduced. The right  ventricular size is normal.   3. Left atrial size was severely dilated.   4. Right atrial size was severely dilated.   5. Moderate pleural effusion.   6. Trivial mitral valve regurgitation.   7. Tricuspid valve regurgitation is mild to moderate.   8. There is moderate calcification of the aortic valve. Aortic valve  regurgitation is mild. Mild aortic valve stenosis.   9. The inferior vena cava is dilated in size with >50% respiratory  variability, suggesting right atrial pressure of 8 mmHg.   Conclusion(s)/Recommendation(s): EF has declined compared to prior study.    Risk Assessment/Calculations:  See below re: Afib   Physical Exam:   Physical Exam Vitals and nursing note reviewed.  Constitutional:      General: She is not in acute distress. Neck:     Vascular: No JVD.  Cardiovascular:     Rate and Rhythm: Normal rate and regular rhythm.     Heart sounds: Normal heart sounds. No murmur heard. Pulmonary:     Effort: Pulmonary effort is normal.      Breath sounds: Normal breath sounds. No wheezing or rales.  Musculoskeletal:     Right lower leg: Edema (1+) present.     Left lower leg: Edema (1+) present.      VISIT DIAGNOSES:   ICD-10-CM   1. Acute on chronic systolic heart failure (HCC)  Z61.09 Pro b natriuretic peptide (BNP)    Basic metabolic panel    AMB Referral to [facility]    furosemide (LASIX) 20 MG tablet    2. Essential hypertension  I10 EKG 12-Lead    Pro b natriuretic peptide (BNP)    Basic metabolic panel    AMB Referral to [facility] Pharm[facility]    3. Leg swelling  M79.89 Pro b natriuretic peptide (BNP)    Basic metabolic panel    AMB Referral to [facility] Pharm-D       ASSESSMENT AND PLAN: .    [name] is a 77 y.o. female with hypertension, persistent atrial fibrillation, moderate obesity, pericardial effusion s/p pericardiocentesis (01/2021), new diagnosis HFrEF, severe WHO Grp II PH (08/2023)  HFrEF: Acute on chronic. Nonischemic cardiomyopathy, EF 30 to 35%, with moderate PAH WHO group 2. Clinically, she has bilateral leg edema today.  No overt dyspnea symptoms with her limited physical activity. Unfortunately, uptitration of GDMT has been limited for following reasons Hyponatremia precluding use of spironolactone. Renal function with CKD 3 A/P precluding or limiting ARNI/SGLT2i use. Today, I increased Lasix from 20 mg daily to 20 mg twice daily. Also change metoprolol to tartrate to succinate 100 mg daily. Recommend checking BMP and proBNP in 10 days with follow-up between me and pharmacist in the next 2-4 weeks. She remains at high risk of recurrent hospitalization due to heart failure. I am optimistic that if her renal function stabilizes at the current level and if blood pressure is not low, we may be able to start Jardiance 10 mg daily. If renal function and blood pressure would tolerate Jardiance, we could then cautiously add Entresto. I encouraged the patient to regularly monitor her  sodium intake.  She may be okay to drink up to 1.5/2 L of water.  I do think her hyponatremia is primarily delusional due to heart failure.  Persistent Afib: Rate controlled on metoprolol tartrate 50 mg bid, diltiazem 120 mg daily. CHA2DS2VASc score 3: Annual stroke risk 4% Continue eliquis 5 mg bid.   Meds ordered this encounter  Medications   furosemide (LASIX) 20 MG tablet    Sig: Take 1 tablet (20 mg total) by mouth 2 (two) times daily.    Dispense:  180 tablet    Refill:  3   metoprolol succinate (TOPROL-XL) 100 MG 24 hr tablet    Sig: Take 1 tablet (100 mg total) by mouth daily. Take with or immediately following a meal.    Dispense:  90 tablet    Refill:  3     F/u in 2-4 weeks  Signed, Elder Negus, MD

## 2023-09-10 NOTE — Patient Instructions (Signed)
Medication Instructions:  Your physician has recommended you make the following change in your medication:  TAKE: furosemide (Lasix) 20 mg by mouth twice daily  STOP: metoprolol tartrate START: metoprolol succinate 100 mg by mouth once daily  STOP: Hydralazine   *If you need a refill on your cardiac medications before your next appointment, please call your pharmacy*   Lab Work: IN 10 DAYS: BMP, BNP  If you have labs (blood work) drawn today and your tests are completely normal, you will receive your results only by: MyChart Message (if you have MyChart) OR A paper copy in the mail If you have any lab test that is abnormal or we need to change your treatment, we will call you to review the results.   Testing/Procedures: IN 2 WEEKS: Your physician has referred you to see the Pharmacy Clinic.    Follow-Up: At [facility], you and your health needs are our priority.  As part of our continuing mission to provide you with exceptional heart care, we have created designated Provider Care Teams.  These Care Teams include your primary Cardiologist (physician) and Advanced Practice Providers (APPs -  Physician Assistants and Nurse Practitioners) who all work together to provide you with the care you need, when you need it.     Your next appointment:   4 week(s)  Provider:   Elder Negus, MD

## 2023-09-15 ENCOUNTER — Ambulatory Visit: Payer: Self-pay

## 2023-09-15 NOTE — Patient Outreach (Signed)
Care Coordination   09/15/2023 Name: [name] MRN: [id number] DOB: 1946-07-15   Care Coordination Outreach Attempts:  An unsuccessful telephone outreach was attempted for a scheduled appointment today.  Follow Up Plan:  Additional outreach attempts will be made to offer the patient care coordination information and services.   Encounter Outcome:  No Answer   Care Coordination Interventions:  No, not indicated    Delsa Sale RN BSN CCM [place]  [facility] Institute, [facility] Health Nurse Care Coordinator  Direct Dial: [phone] Website: [email or web address]

## 2023-09-24 ENCOUNTER — Ambulatory Visit: Payer: Medicare HMO | Attending: Physician Assistant | Admitting: Student

## 2023-09-24 ENCOUNTER — Ambulatory Visit: Payer: Medicare HMO | Admitting: Physician Assistant

## 2023-09-24 ENCOUNTER — Encounter: Payer: Self-pay | Admitting: Student

## 2023-09-24 ENCOUNTER — Ambulatory Visit: Payer: Medicare HMO

## 2023-09-24 VITALS — BP 124/80 | Wt 211.2 lb

## 2023-09-24 DIAGNOSIS — M7989 Other specified soft tissue disorders: Secondary | ICD-10-CM | POA: Diagnosis not present

## 2023-09-24 DIAGNOSIS — N1832 Chronic kidney disease, stage 3b: Secondary | ICD-10-CM | POA: Diagnosis not present

## 2023-09-24 DIAGNOSIS — I10 Essential (primary) hypertension: Secondary | ICD-10-CM | POA: Diagnosis not present

## 2023-09-24 DIAGNOSIS — I5023 Acute on chronic systolic (congestive) heart failure: Secondary | ICD-10-CM | POA: Diagnosis not present

## 2023-09-24 DIAGNOSIS — I5041 Acute combined systolic (congestive) and diastolic (congestive) heart failure: Secondary | ICD-10-CM | POA: Diagnosis not present

## 2023-09-24 MED ORDER — ISOSORBIDE MONONITRATE ER 30 MG PO TB24: 30.0000 mg | ORAL_TABLET | Freq: Every day | ORAL | 3 refills | Status: DC

## 2023-09-24 NOTE — Patient Instructions (Signed)
No changes made by your pharmacist Yousuf Ager, PharmD at today's visit:    Bring all of your meds, your BP cuff and your record of home blood pressures to your next appointment.    HOW TO TAKE YOUR BLOOD PRESSURE AT HOME  Rest 5 minutes before taking your blood pressure.  Don't smoke or drink caffeinated beverages for at least 30 minutes before. Take your blood pressure before (not after) you eat. Sit comfortably with your back supported and both feet on the floor (don't cross your legs). Elevate your arm to heart level on a table or a desk. Use the proper sized cuff. It should fit smoothly and snugly around your bare upper arm. There should be enough room to slip a fingertip under the cuff. The bottom edge of the cuff should be 1 inch above the crease of the elbow. Ideally, take 3 measurements at one sitting and record the average.  Important lifestyle changes to control high blood pressure  Intervention  Effect on the BP  Lose extra pounds and watch your waistline Weight loss is one of the most effective lifestyle changes for controlling blood pressure. If you're overweight or obese, losing even a small amount of weight can help reduce blood pressure. Blood pressure might go down by about 1 millimeter of mercury (mm Hg) with each kilogram (about 2.2 pounds) of weight lost.  Exercise regularly As a general goal, aim for at least 30 minutes of moderate physical activity every day. Regular physical activity can lower high blood pressure by about 5 to 8 mm Hg.  Eat a healthy diet Eating a diet rich in whole grains, fruits, vegetables, and low-fat dairy products and low in saturated fat and cholesterol. A healthy diet can lower high blood pressure by up to 11 mm Hg.  Reduce salt (sodium) in your diet Even a small reduction of sodium in the diet can improve heart health and reduce high blood pressure by about 5 to 6 mm Hg.  Limit alcohol One drink equals 12 ounces of beer, 5 ounces of wine,  or 1.5 ounces of 80-proof liquor.  Limiting alcohol to less than one drink a day for women or two drinks a day for men can help lower blood pressure by about 4 mm Hg.   If you have any questions or concerns please use My Chart to send questions or call the office at [phone]  

## 2023-09-24 NOTE — Progress Notes (Signed)
Patient ID: [name]                 DOB: [birth year]                      MRN: [id number]     HPI: [name] is a 77 y.o. female referred by Dr. Rosemary Holms  to pharmacy clinic for HF medication management. PMH is significant for  hypertension, persistent atrial fibrillation, moderate obesity, pericardial effusion s/p pericardiocentesis (01/2021), new diagnosis HFrEF, severe WHO Grp II PH (08/2023) . Most recent LVEF 30-35%  on 09/01/2023.   Patient recently had recurrent hospitalization with shortness of breath, hyponatremia.  She was found to have new diagnosis of HFrEF, with EF of 30-35%, nonischemic cardiomyopathy, severe pulmonary hypertension with mPAP 47 mmHg, WHO group 2 with LVEDP of 26 mmHg.  Hyponatremia was thought to be delusional in the setting of heart failure.  She was diuresed with improvement in her symptoms of dyspnea and leg edema.uptitration of GDMT has been limited for following reasons.Hyponatremia precluding use of spironolactone. Renal function with CKD 3 A/P precluding or limiting ARNI/SGLT2i use. Last visit with Dr.Patwardhan lasix dose was increased from 20 mg once daily to 20 mg twice daily and metoprolol tartrate was changed to metoprolol succinate. Her BP at the last visit 130/90 and she is also in Imdur 30 mg daily  Patient presented today wit her daughter. Will get BMP drawn today and added A1c checks too.reports her swelling has improved significantly still lower leg seems puffy to the patient. Stable weight ~210-211 range, appetite normal, able to perform daily living activity.  Denies SOB,palpitation,dizziness, lightheadedness. She reports she is lot more tired han before checks BP at home ~122-130 /75-80 range does not record heart rate. We discussed HF medications  reasoning behind medication titration, importance of medication adherence, and patient engagement. Their MOA and common side effects and it's management and monitoring labs. Patient was out of refill  on Imdur requesting more refill    Current CHF meds: Metoprolol succinate 100 mg daily  Previously tried: hydralazine - nausea  Adherence Assessment  Do you ever forget to take your medication? [] Yes [x] No  Do you ever skip doses due to side effects? [] Yes [x] No  Do you have trouble affording your medicines? [] Yes [x] No  Are you ever unable to pick up your medication due to transportation difficulties? [] Yes [x] No  Do you ever stop taking your medications because you don't believe they are helping? [] Yes [x] No  Do you check your weight daily? [x] Yes [] No   Adherence strategy: pill box   Barriers to obtaining medications: none   BP goal: <130/80  Social History:  Alcohol: none  Smoking: never   Diet: low salt diet   Exercise: walks around the house using walker   Home BP readings: ~122-130 /75-80 range does not record heart rate  Wt Readings from Last 3 Encounters:  09/24/23 211 lb 3.2 oz (95.8 kg)  09/10/23 214 lb 12.8 oz (97.4 kg)  09/02/23 209 lb 6.4 oz (95 kg)   BP Readings from Last 3 Encounters:  09/24/23 124/80  09/10/23 (!) 130/90  09/02/23 121/89   Pulse Readings from Last 3 Encounters:  09/10/23 87  09/02/23 89  08/12/23 92    Renal function: CrCl cannot be calculated (Patient's most recent lab result is older than the maximum 21 days allowed.).  Past Medical History:  Diagnosis Date   Arthritis    Atrial fibrillation (HCC)  CHF (congestive heart failure) (HCC)    CKD (chronic kidney disease), stage III (HCC)    Colon polyps    Dysrhythmia    new onset afib    Goiter    Hyperglycemia    Hyperlipidemia    Hypertension    Hypothyroidism    Morbid obesity (HCC)    Osteopenia     Current Outpatient Medications on File Prior to Visit  Medication Sig Dispense Refill   acetaminophen (TYLENOL) 325 MG tablet Take 325-650 mg by mouth daily as needed for mild pain or headache.     Calcium Carb-Cholecalciferol (CALTRATE 600+D3 PO) Take 1  tablet by mouth every evening.     [employer] IRON 325 (65 Fe) MG tablet Take 325 mg by mouth daily at 6 PM.     ELIQUIS 5 MG TABS tablet TAKE 1 TABLET BY MOUTH TWICE A DAY (Patient taking differently: Take 5 mg by mouth 2 (two) times daily.) 60 tablet 5   furosemide (LASIX) 20 MG tablet Take 1 tablet (20 mg total) by mouth 2 (two) times daily. 180 tablet 3   levothyroxine (SYNTHROID, LEVOTHROID) 125 MCG tablet Take 125 mcg by mouth daily before breakfast.     metoprolol succinate (TOPROL-XL) 100 MG 24 hr tablet Take 1 tablet (100 mg total) by mouth daily. Take with or immediately following a meal. 90 tablet 3   omeprazole (PRILOSEC) 20 MG capsule TAKE 1 CAPSULE BY MOUTH EVERY DAY (Patient taking differently: Take 20 mg by mouth daily before breakfast.) 90 capsule 1   Oxymetazoline HCl (SINEX ULTRA FINE MIST 12-HOUR NA) Place 1 spray into both nostrils every 12 (twelve) hours as needed (for congestion).     pravastatin (PRAVACHOL) 80 MG tablet Take 80 mg by mouth in the morning.     VISINE 0.025-0.3 % ophthalmic solution Place 1 drop into both eyes 2 (two) times daily as needed for eye irritation.     vitamin B-12 (CYANOCOBALAMIN) 1000 MCG tablet Take 1,000 mcg by mouth daily.     No current facility-administered medications on file prior to visit.    Allergies  Allergen Reactions   Spironolactone Other (See Comments)    Hyponatremia-  the level of sodium in blood is too high   Atorvastatin Other (See Comments)    "Aches"      Assessment/Plan:  1. CHF -  Acute combined systolic and diastolic heart failure (HCC) Assessment: BP is controlled in office BP 120/80 mmHg  goal <130/80 Home BP ~ ~122-130 /75-80 range does not record heart rate Takes lasix 20 mg twice daily - swelling in lower extremities has improved  Tolerates metoprolol well without any side effects Other than fatigue denies for SOB, palpitation, chest pain, headaches,or swelling Stable weight ~210-211 range, appetite normal,  able to perform daily living activity.  Along with BMP lab today we will get A1c checked  Plan:  Continue taking lasix 20 mg twice daily, Imdur ER 30 mg daily and metoprolol succinate 100 mg daily  Patient to keep record of BP readings with heart rate and report to [name] at the next visit Patient to bring BP monitor at next visit for validation  If SrCr and electrolytes back to baseline in today's BMP we will either add SGLT2i or MRA  Patient to see PharmD in 4 weeks for follow up  Labs today BMP and A1c   CKD stage 3b, GFR 30-44 ml/min (HCC) -     Hemoglobin A1c  Acute combined systolic and diastolic heart  failure [facility]) Assessment & Plan: Assessment: BP is controlled in office BP 120/80 mmHg  goal <130/80 Home BP ~ ~122-130 /75-80 range does not record heart rate Takes lasix 20 mg twice daily - swelling in lower extremities has improved  Tolerates metoprolol well without any side effects Other than fatigue denies for SOB, palpitation, chest pain, headaches,or swelling Stable weight ~210-211 range, appetite normal, able to perform daily living activity.  Along with BMP lab today we will get A1c checked  Plan:  Continue taking lasix 20 mg twice daily, Imdur ER 30 mg daily and metoprolol succinate 100 mg daily  Patient to keep record of BP readings with heart rate and report to [name] at the next visit Patient to bring BP monitor at next visit for validation  If SrCr and electrolytes back to baseline in today's BMP we will either add SGLT2i or MRA  Patient to see PharmD in 4 weeks for follow up  Labs today BMP and A1c     Other orders -     Isosorbide Mononitrate ER; Take 1 tablet (30 mg total) by mouth daily.  Dispense: 90 tablet; Refill: 3       Thank you   [name], Pharm.D [facility] A Division of [facility] [address] [place], [place] [address]  Phone: [phone]; Fax: [phone]

## 2023-09-24 NOTE — Assessment & Plan Note (Signed)
Assessment: BP is controlled in office BP 120/80 mmHg  goal <130/80 Home BP ~ ~122-130 /75-80 range does not record heart rate Takes lasix 20 mg twice daily - swelling in lower extremities has improved  Tolerates metoprolol well without any side effects Other than fatigue denies for SOB, palpitation, chest pain, headaches,or swelling Stable weight ~210-211 range, appetite normal, able to perform daily living activity.  Along with BMP lab today we will get A1c checked  Plan:  Continue taking lasix 20 mg twice daily, Imdur ER 30 mg daily and metoprolol succinate 100 mg daily  Patient to keep record of BP readings with heart rate and report to [name] at the next visit Patient to bring BP monitor at next visit for validation  If SrCr and electrolytes back to baseline in today's BMP we will either add SGLT2i or MRA  Patient to see PharmD in 4 weeks for follow up  Labs today BMP and A1c

## 2023-09-25 ENCOUNTER — Telehealth: Payer: Self-pay

## 2023-09-25 ENCOUNTER — Telehealth: Payer: Self-pay | Admitting: *Deleted

## 2023-09-25 LAB — BASIC METABOLIC PANEL
BUN/Creatinine Ratio: 18 (ref 12–28)
BUN: 26 mg/dL (ref 8–27)
CO2: 25 mmol/L (ref 20–29)
Calcium: 9.7 mg/dL (ref 8.7–10.3)
Chloride: 96 mmol/L (ref 96–106)
Creatinine, Ser: 1.42 mg/dL — ABNORMAL HIGH (ref 0.57–1.00)
Glucose: 100 mg/dL — ABNORMAL HIGH (ref 70–99)
Potassium: 4.5 mmol/L (ref 3.5–5.2)
Sodium: 138 mmol/L (ref 134–144)
eGFR: 38 mL/min/{1.73_m2} — ABNORMAL LOW (ref >59)

## 2023-09-25 LAB — HEMOGLOBIN A1C
Est. average glucose Bld gHb Est-mCnc: 126 mg/dL
Hgb A1c MFr Bld: 6 % — ABNORMAL HIGH (ref 4.8–5.6)

## 2023-09-25 LAB — PRO B NATRIURETIC PEPTIDE: NT-Pro BNP: 17553 pg/mL — ABNORMAL HIGH (ref 0–738)

## 2023-09-25 MED ORDER — FUROSEMIDE 40 MG PO TABS: 40.0000 mg | ORAL_TABLET | Freq: Two times a day (BID) | ORAL | 3 refills | Status: DC

## 2023-09-25 NOTE — Progress Notes (Signed)
ProBNP is very high. Sodium has improved. We should increase lasix from 20 mg bid to 40 mg bid. She has follow up with Dayna on 11/7.  Thanks MJP

## 2023-09-25 NOTE — Telephone Encounter (Signed)
Left voicemail to return call to office.

## 2023-09-25 NOTE — Telephone Encounter (Signed)
The patient has been notified of the result and verbalized understanding.  All questions (if any) were answered.  Pt aware to increase her lasix to 40 mg po bid and keep her follow-up appt as scheduled with Ronie Spies PA-C on 11/7.  Confirmed the pharmacy of choice with the pt.   Pt verbalized understanding and agrees with this plan.

## 2023-09-25 NOTE — Telephone Encounter (Signed)
-----   Message from [facility] sent at 09/25/2023 11:42 AM EDT ----- ProBNP is very high. Sodium has improved. We should increase lasix from 20 mg bid to 40 mg bid. She has follow up with Dayna on 11/7.  Thanks MJP

## 2023-09-26 ENCOUNTER — Ambulatory Visit: Payer: Self-pay

## 2023-09-26 ENCOUNTER — Emergency Department (HOSPITAL_COMMUNITY)
Admission: EM | Admit: 2023-09-26 | Discharge: 2023-09-27 | Disposition: A | Discharge status: Home / Self Care | Payer: Medicare HMO | Attending: Emergency Medicine | Admitting: Emergency Medicine

## 2023-09-26 ENCOUNTER — Telehealth: Payer: Self-pay | Admitting: Student

## 2023-09-26 ENCOUNTER — Other Ambulatory Visit: Payer: Self-pay

## 2023-09-26 ENCOUNTER — Emergency Department (HOSPITAL_COMMUNITY): Payer: Medicare HMO

## 2023-09-26 DIAGNOSIS — R0989 Other specified symptoms and signs involving the circulatory and respiratory systems: Secondary | ICD-10-CM | POA: Diagnosis not present

## 2023-09-26 DIAGNOSIS — E039 Hypothyroidism, unspecified: Secondary | ICD-10-CM | POA: Diagnosis not present

## 2023-09-26 DIAGNOSIS — Z79899 Other long term (current) drug therapy: Secondary | ICD-10-CM | POA: Diagnosis not present

## 2023-09-26 DIAGNOSIS — R441 Visual hallucinations: Secondary | ICD-10-CM | POA: Diagnosis not present

## 2023-09-26 DIAGNOSIS — R410 Disorientation, unspecified: Secondary | ICD-10-CM | POA: Insufficient documentation

## 2023-09-26 DIAGNOSIS — Z7901 Long term (current) use of anticoagulants: Secondary | ICD-10-CM | POA: Diagnosis not present

## 2023-09-26 DIAGNOSIS — R4182 Altered mental status, unspecified: Secondary | ICD-10-CM | POA: Diagnosis not present

## 2023-09-26 DIAGNOSIS — I517 Cardiomegaly: Secondary | ICD-10-CM | POA: Diagnosis not present

## 2023-09-26 DIAGNOSIS — R918 Other nonspecific abnormal finding of lung field: Secondary | ICD-10-CM | POA: Diagnosis not present

## 2023-09-26 LAB — COMPREHENSIVE METABOLIC PANEL
ALT: 20 U/L (ref 0–44)
AST: 23 U/L (ref 15–41)
Albumin: 3.5 g/dL (ref 3.5–5.0)
Alkaline Phosphatase: 40 U/L (ref 38–126)
Anion gap: 9 (ref 5–15)
BUN: 31 mg/dL — ABNORMAL HIGH (ref 8–23)
CO2: 27 mmol/L (ref 22–32)
Calcium: 8.6 mg/dL — ABNORMAL LOW (ref 8.9–10.3)
Chloride: 99 mmol/L (ref 98–111)
Creatinine, Ser: 1.8 mg/dL — ABNORMAL HIGH (ref 0.44–1.00)
GFR, Estimated: 28 mL/min — ABNORMAL LOW (ref >60)
Glucose, Bld: 109 mg/dL — ABNORMAL HIGH (ref 70–99)
Potassium: 4 mmol/L (ref 3.5–5.1)
Sodium: 135 mmol/L (ref 135–145)
Total Bilirubin: 0.7 mg/dL (ref 0.3–1.2)
Total Protein: 6.5 g/dL (ref 6.5–8.1)

## 2023-09-26 LAB — CBC WITH DIFFERENTIAL/PLATELET
Abs Immature Granulocytes: 0.03 10*3/uL (ref 0.00–0.07)
Basophils Absolute: 0.1 10*3/uL (ref 0.0–0.1)
Basophils Relative: 1 %
Eosinophils Absolute: 0.1 10*3/uL (ref 0.0–0.5)
Eosinophils Relative: 1 %
HCT: 43.6 % (ref 36.0–46.0)
Hemoglobin: 14 g/dL (ref 12.0–15.0)
Immature Granulocytes: 0 %
Lymphocytes Relative: 8 %
Lymphs Abs: 0.8 10*3/uL (ref 0.7–4.0)
MCH: 27.2 pg (ref 26.0–34.0)
MCHC: 32.1 g/dL (ref 30.0–36.0)
MCV: 84.8 fL (ref 80.0–100.0)
Monocytes Absolute: 0.8 10*3/uL (ref 0.1–1.0)
Monocytes Relative: 8 %
Neutro Abs: 8 10*3/uL — ABNORMAL HIGH (ref 1.7–7.7)
Neutrophils Relative %: 82 %
Platelets: 299 10*3/uL (ref 150–400)
RBC: 5.14 MIL/uL — ABNORMAL HIGH (ref 3.87–5.11)
RDW: 15 % (ref 11.5–15.5)
WBC: 9.8 10*3/uL (ref 4.0–10.5)
nRBC: 0 % (ref 0.0–0.2)

## 2023-09-26 NOTE — Patient Instructions (Signed)
Visit Information  Thank you for taking time to visit with me today. Please don't hesitate to contact me if I can be of assistance to you.   Following are the goals we discussed today:   Goals Addressed             This Visit's Progress    COMPLETED: To continue to manage CHF       Care Coordination Interventions: Basic overview and discussion of pathophysiology of Heart Failure reviewed Reviewed Heart Failure Action Plan in depth and provided written copy Advised patient to weigh each morning after emptying bladder Discussed importance of daily weight and advised patient to weigh and record daily Reviewed role of diuretics in prevention of fluid overload and management of heart failure; Discussed the importance of keeping all appointments with provider       Please call the care guide team at [phone] if you need to cancel or reschedule your appointment.   If you are experiencing a Mental Health or Behavioral Health Crisis or need someone to talk to, please call 1-800-273-TALK (toll free, 24 hour hotline)  Patient verbalizes understanding of instructions and care plan provided today and agrees to view in MyChart. Active MyChart status and patient understanding of how to access instructions and care plan via MyChart confirmed with patient.     Delsa Sale RN BSN CCM [place]  [facility], [facility] Nurse Care Coordinator  Direct Dial: [phone] Website: [email or web address]

## 2023-09-26 NOTE — Telephone Encounter (Signed)
   Patient daughter called Answering Service with concern about patient having hallucinations. Called and spoke daughter who states patient is delirious and hallucinating. She has had this before due to hyponatremia and has been admitted for this.  She has had 2 admissions for this in the last 2 months. During most recent admission, hyponatremia was felt to be due to hypervolemia in setting of acute on chronic CHF.  Recent labs on 09/24/2023 showed a Na of 138 and Pro-BNP was markedly elevated at 17,553. Lasix was increased at that time. I think it would be surprising if she was now severely hyponatremic to the point of causing hallucinations in such a short time given sodium was normal 2 days ago. Recommend patient go to the ED for further evaluation given acute change in mental status with hallucinations. Daughter voiced understanding, agreed, and thanked me for calling.  Corrin Parker, PA-C 09/26/2023 8:11 PM

## 2023-09-26 NOTE — ED Triage Notes (Signed)
Patient family reported confusion and visual hallucinations this evening , they are concerned about her electrolytes /medication changes , alert and oriented at triage . Denies pain or SOB .

## 2023-09-26 NOTE — Patient Outreach (Signed)
Care Coordination   Initial Visit Note   09/26/2023 Name: [name] MRN: [id number] DOB: 05/19/1946  [name] is a 77 y.o. year old female who sees Creola Corn, MD for primary care. I spoke with  [name] Estimable by phone today.  What matters to the patients health and wellness today?  Patient would like to continue to manage her CHF.     Goals Addressed             This Visit's Progress    COMPLETED: To continue to manage CHF       Care Coordination Interventions: Basic overview and discussion of pathophysiology of Heart Failure reviewed Reviewed Heart Failure Action Plan in depth and provided written copy Advised patient to weigh each morning after emptying bladder Discussed importance of daily weight and advised patient to weigh and record daily Reviewed role of diuretics in prevention of fluid overload and management of heart failure; Discussed the importance of keeping all appointments with provider     Interventions Today    Flowsheet Row Most Recent Value  Chronic Disease   Chronic disease during today's visit Congestive Heart Failure (CHF)  General Interventions   General Interventions Discussed/Reviewed General Interventions Discussed, General Interventions Reviewed, Labs, Doctor Visits, Durable Medical Equipment (DME)  Doctor Visits Discussed/Reviewed Doctor Visits Reviewed, Doctor Visits Discussed, Specialist  Durable Medical Equipment (DME) BP Cuff  Education Interventions   Education Provided Provided Education  Provided Verbal Education On Medication, Labs, When to see the doctor  Pharmacy Interventions   Pharmacy Dicussed/Reviewed Pharmacy Topics Discussed, Pharmacy Topics Reviewed, Medications and their functions          SDOH assessments and interventions completed:  No     Care Coordination Interventions:  Yes, provided   Follow up plan: No further intervention required.   Encounter Outcome:  Patient Visit Completed

## 2023-09-27 ENCOUNTER — Emergency Department (HOSPITAL_COMMUNITY): Payer: Medicare HMO

## 2023-09-27 DIAGNOSIS — R4182 Altered mental status, unspecified: Secondary | ICD-10-CM | POA: Diagnosis not present

## 2023-09-27 LAB — URINALYSIS, W/ REFLEX TO CULTURE (INFECTION SUSPECTED)
Bilirubin Urine: NEGATIVE
Glucose, UA: NEGATIVE mg/dL
Ketones, ur: NEGATIVE mg/dL
Nitrite: NEGATIVE
Protein, ur: NEGATIVE mg/dL
Specific Gravity, Urine: 1.014 (ref 1.005–1.030)
pH: 6 (ref 5.0–8.0)

## 2023-09-27 LAB — TSH: TSH: 1.406 u[IU]/mL (ref 0.350–4.500)

## 2023-09-27 NOTE — ED Notes (Signed)
Pt to CT scan.

## 2023-09-27 NOTE — ED Provider Notes (Signed)
[place]ENC[facility] AT [facility] Provider Note   CSN: [id number] Arrival date & time: 09/26/23  2119     History  Chief Complaint  Patient presents with   Confused / Hallucinations     [name] is a 77 y.o. female.  Patient presents to the emergency department for evaluation of confusion and visual hallucinations that occurred this evening.  Family called the patient's cardiologist and were told to come to the ED.  Patient has had similar symptoms in the past.  She was admitted 1 month ago with confusion and hallucinations and was found to be profoundly hyponatremic secondary to her diuretics.  She just recently had her Lasix increased and therefore was referred to the ER for further workup.       Home Medications Prior to Admission medications   Medication Sig Start Date End Date Taking? Authorizing Provider  acetaminophen (TYLENOL) 325 MG tablet Take 325-650 mg by mouth daily as needed for mild pain or headache.    [provider]  Calcium Carb-Cholecalciferol (CALTRATE 600+D3 PO) Take 1 tablet by mouth every evening.    [provider]  [employer]S IRON 325 (65 Fe) MG tablet Take 325 mg by mouth daily at 6 PM. 01/16/21   [provider]  ELIQUIS 5 MG TABS tablet TAKE 1 TABLET BY MOUTH TWICE A DAY Patient taking differently: Take 5 mg by mouth 2 (two) times daily. 07/07/23   Patwardhan, Anabel Bene, MD  furosemide (LASIX) 40 MG tablet Take 1 tablet (40 mg total) by mouth 2 (two) times daily. 09/25/23   Patwardhan, Anabel Bene, MD  isosorbide mononitrate (IMDUR) 30 MG 24 hr tablet Take 1 tablet (30 mg total) by mouth daily. 09/24/23   Patwardhan, Anabel Bene, MD  levothyroxine (SYNTHROID, LEVOTHROID) 125 MCG tablet Take 125 mcg by mouth daily before breakfast. 06/19/12   [provider]  metoprolol succinate (TOPROL-XL) 100 MG 24 hr tablet Take 1 tablet (100 mg total) by mouth daily. Take with or immediately following a meal. 09/10/23    Weaver, Lorin Picket T, PA-C  omeprazole (PRILOSEC) 20 MG capsule TAKE 1 CAPSULE BY MOUTH EVERY DAY Patient taking differently: Take 20 mg by mouth daily before breakfast. 12/21/21   Hilarie Fredrickson, MD  Oxymetazoline HCl (SINEX ULTRA FINE MIST 12-HOUR NA) Place 1 spray into both nostrils every 12 (twelve) hours as needed (for congestion).    [provider]  pravastatin (PRAVACHOL) 80 MG tablet Take 80 mg by mouth in the morning. 07/16/12   [provider]  VISINE 0.025-0.3 % ophthalmic solution Place 1 drop into both eyes 2 (two) times daily as needed for eye irritation.    [provider]  vitamin B-12 (CYANOCOBALAMIN) 1000 MCG tablet Take 1,000 mcg by mouth daily. 01/16/21   [provider]      Allergies    Spironolactone and Atorvastatin    Review of Systems   Review of Systems  Physical Exam Updated Vital Signs BP (!) 150/102   Pulse (!) 25   Temp (!) 97.5 F (36.4 C)   Resp 20   SpO2 90%  Physical Exam Vitals and nursing note reviewed.  Constitutional:      General: She is not in acute distress.    Appearance: She is well-developed.  HENT:     Head: Normocephalic and atraumatic.     Mouth/Throat:     Mouth: Mucous membranes are moist.  Eyes:     General: Vision grossly intact.  Gaze aligned appropriately.     Extraocular Movements: Extraocular movements intact.     Conjunctiva/sclera: Conjunctivae normal.  Cardiovascular:     Rate and Rhythm: Normal rate and regular rhythm.     Pulses: Normal pulses.     Heart sounds: Normal heart sounds, S1 normal and S2 normal. No murmur heard.    No friction rub. No gallop.  Pulmonary:     Effort: Pulmonary effort is normal. No respiratory distress.     Breath sounds: Normal breath sounds.  Abdominal:     General: Bowel sounds are normal.     Palpations: Abdomen is soft.     Tenderness: There is no abdominal tenderness. There is no guarding or rebound.     Hernia: No hernia is present.   Musculoskeletal:        General: No swelling.     Cervical back: Full passive range of motion without pain, normal range of motion and neck supple. No spinous process tenderness or muscular tenderness. Normal range of motion.     Right lower leg: No edema.     Left lower leg: No edema.  Skin:    General: Skin is warm and dry.     Capillary Refill: Capillary refill takes less than 2 seconds.     Findings: No ecchymosis, erythema, rash or wound.  Neurological:     General: No focal deficit present.     Mental Status: She is alert and oriented to person, place, and time.     GCS: GCS eye subscore is 4. GCS verbal subscore is 5. GCS motor subscore is 6.     Cranial Nerves: Cranial nerves 2-12 are intact.     Sensory: Sensation is intact.     Motor: Motor function is intact.     Coordination: Coordination is intact.  Psychiatric:        Attention and Perception: Attention normal.        Mood and Affect: Mood normal.        Speech: Speech normal.        Behavior: Behavior normal.     ED Results / Procedures / Treatments   Labs (all labs ordered are listed, but only abnormal results are displayed) Labs Reviewed  CBC WITH DIFFERENTIAL/PLATELET - Abnormal; Notable for the following components:      Result Value   RBC 5.14 (*)    Neutro Abs 8.0 (*)    All other components within normal limits  COMPREHENSIVE METABOLIC PANEL - Abnormal; Notable for the following components:   Glucose, Bld 109 (*)    BUN 31 (*)    Creatinine, Ser 1.80 (*)    Calcium 8.6 (*)    GFR, Estimated 28 (*)    All other components within normal limits  URINALYSIS, W/ REFLEX TO CULTURE (INFECTION SUSPECTED) - Abnormal; Notable for the following components:   APPearance HAZY (*)    Hgb urine dipstick SMALL (*)    Leukocytes,Ua SMALL (*)    Bacteria, UA RARE (*)    All other components within normal limits  TSH    EKG None  Radiology DG Chest 2 View  Result Date: 09/26/2023 CLINICAL DATA:   Confusion, on Lasix. EXAM: CHEST - 2 VIEW COMPARISON:  Chest x-ray 08/28/2023 FINDINGS: There some patchy airspace opacities in the left lower lung. There is no pleural effusion or pneumothorax. Heart is enlarged. There central pulmonary vascular congestion. No acute fractures are seen. IMPRESSION: 1. Patchy airspace opacities in the left lower lung  may represent atelectasis or pneumonia. 2. Cardiomegaly with central pulmonary vascular congestion. Electronically Signed   By: Darliss Cheney M.D.   On: 09/26/2023 23:12    Procedures Procedures    Medications Ordered in ED Medications - No data to display  ED Course/ Medical Decision Making/ A&P                                 Medical Decision Making Amount and/or Complexity of Data Reviewed Labs: ordered. Radiology: ordered.   Differential diagnosis considered includes, but not limited to: TIA; Stroke; ICH; Seizure; electrolyte abnormality; hypoglycemia; toxic/pharmacologic causes; CNS infection; psychiatric disorder  Patient appears well at time of arrival and evaluation.  Her records were accessed and recent hospitalization was reviewed.  She did have altered mental status secondary to electrolyte abnormality during most recent hospitalization.  Her sodium today, however, is 135, does not explain her symptoms.  Other electrolytes are normal.  Patient does have mild elevation of BUN and creatinine.  This is secondary to increased diuresis, she was in heart failure during her last hospitalization.  I would not hydrate her at this point.  Discussed further workup with family.  She does have a history of hypothyroidism.  Her TSH, however, is in the normal range.  A CT head was performed to further evaluate.  It is felt that this is a reasonable screening exam to rule out significant pathology causing her confusion, further workup can be performed as an outpatient.        Final Clinical Impression(s) / ED Diagnoses Final diagnoses:   Confusion  Hallucination, visual    Rx / DC Orders ED Discharge Orders     None         Takyla Kuchera, Canary Brim, MD 09/27/23 [phone]

## 2023-09-29 ENCOUNTER — Encounter: Payer: Self-pay | Admitting: Cardiology

## 2023-10-01 ENCOUNTER — Telehealth: Payer: Self-pay | Admitting: Cardiology

## 2023-10-01 NOTE — Telephone Encounter (Signed)
Pt c/o medication issue:  1. Name of Medication:   furosemide (LASIX) 40 MG tablet   2. How are you currently taking this medication (dosage and times per day)?    3. Are you having a reaction (difficulty breathing--STAT)?   4. What is your medication issue?   Daughter Marylene Land) stated patient has not been taking this medication for the past couple of days and today when patient took the medication she started having hallucinations.  Daughter wants to know next steps.

## 2023-10-01 NOTE — Telephone Encounter (Signed)
I spoke with the pts daughter.. pt took her lasix Monday and Tuesday and did well... today she took it again later in the morning due to some mild peripheral edema... she now is seeing the "people" again and having hallucinations... she sounds different than she did a few hours ago per her daughter... she feels it is related to the Lasix.   Her last note:  09/29/23 12:39 PM Hello Ms. [name], I am sorry to hear that. I reviewed mum's chart including recent ER evaluation. I see the timeline of events but doubt that increased lasix dose would explain this. We can try holding the lasix for 1-2 days (no more) and see if symptoms get better. If they don't, she may need evaluation for visual hallucinations. PCP may be in the best position to guide in that case.  She has an appt next week but sh is asking if there anything else she can take prior to help manage her fluid.   I will forward to Dr Rosemary Holms.

## 2023-10-01 NOTE — Telephone Encounter (Signed)
Lasix are not known to be associated with hallucinations.  That said, if her symptoms are improved with Lasix, I am happy to stop Lasix.  However, I would recommend alternate diuretic, as I would be concerned about recurrent heart failure hospitalization without any diuretics.  Recommend oral Bumex 1 mg twice daily.  I also discussed with her PCP Dr. Timothy Lasso.  He agrees that Lasix is less likely to be the cause.  He will be seeing her in the office on Friday and will discuss any other possible causes.  Thanks MJP

## 2023-10-02 ENCOUNTER — Encounter: Payer: Self-pay | Admitting: Cardiology

## 2023-10-02 MED ORDER — BUMETANIDE 1 MG PO TABS: 1.0000 mg | ORAL_TABLET | Freq: Every day | ORAL | 3 refills | Status: DC

## 2023-10-02 NOTE — Telephone Encounter (Signed)
I spoke with the pts daughter Marylene Land... she will change her med over to the Bumex.... she is seeing Dr Timothy Lasso tomorrow.

## 2023-10-02 NOTE — Telephone Encounter (Signed)
See MyChart encounter

## 2023-10-03 DIAGNOSIS — I130 Hypertensive heart and chronic kidney disease with heart failure and stage 1 through stage 4 chronic kidney disease, or unspecified chronic kidney disease: Secondary | ICD-10-CM | POA: Diagnosis not present

## 2023-10-03 DIAGNOSIS — I5022 Chronic systolic (congestive) heart failure: Secondary | ICD-10-CM | POA: Diagnosis not present

## 2023-10-03 DIAGNOSIS — E871 Hypo-osmolality and hyponatremia: Secondary | ICD-10-CM | POA: Diagnosis not present

## 2023-10-03 DIAGNOSIS — N1831 Chronic kidney disease, stage 3a: Secondary | ICD-10-CM | POA: Diagnosis not present

## 2023-10-03 DIAGNOSIS — R600 Localized edema: Secondary | ICD-10-CM | POA: Diagnosis not present

## 2023-10-03 DIAGNOSIS — G319 Degenerative disease of nervous system, unspecified: Secondary | ICD-10-CM | POA: Diagnosis not present

## 2023-10-03 DIAGNOSIS — Z6835 Body mass index (BMI) 35.0-35.9, adult: Secondary | ICD-10-CM | POA: Diagnosis not present

## 2023-10-03 DIAGNOSIS — Z72821 Inadequate sleep hygiene: Secondary | ICD-10-CM | POA: Diagnosis not present

## 2023-10-03 DIAGNOSIS — I480 Paroxysmal atrial fibrillation: Secondary | ICD-10-CM | POA: Diagnosis not present

## 2023-10-03 DIAGNOSIS — R443 Hallucinations, unspecified: Secondary | ICD-10-CM | POA: Diagnosis not present

## 2023-10-06 ENCOUNTER — Encounter: Payer: Self-pay | Admitting: Physician Assistant

## 2023-10-06 NOTE — Progress Notes (Deleted)
   Cardiology Office Note    Date:  10/06/2023  ID:  [name], DOB 1946-04-09, MRN [id number] PCP:  Creola Corn, MD  Cardiologist:  Elder Negus, MD  Electrophysiologist:  None   Chief Complaint: ***  History of Present Illness: .    [name] is a 77 y.o. female with visit-pertinent history of prior HFpEF progressing to HFrEF/NICM, nonobstructive CAD, mild-mod TR, mild AI/AS, permanent atrial fibrillation on Eliquis, pulmonary HTN, pericardial effusion s/p pericardiocentesis in 01/2021, hypertension, hyperlipidemia, hypothyroidism, CKD stage IIIb, hyponatremia, and morbid obesity seen for follow-up.  She was previously known to have HFpEF. She was admitted 8-08/2023 with severe hyponatremia felt to be due to excessive free water intake and recent diuretic use. She initially responded to IV fluids but then sodium levels dropped again, so she was treated with Lasix and fluid restriction with gradual improvement in sodium of 147 at discharge. Spironolactone was stopped during admission as this was felt to be contributing and amlodipine was stopped as well as this was felt to possibly be contributing to her lower extremity edema which is the reason she was started on the diuretics. She was readmitted 9/26-10/1/24 with LE edema and exertional fatigue. Echo showed drop in EF to 30-35% with global HK, mild LVH, moderately reduced RVSF, severe BAE, mild-mod TR, mild AI/AS. She underwent cath showing mild-moderate diffuse disease in the LCx and LAD but not flow-limiting, moderate to severe secondary pulmonary hypertension with pulmonary venous hypertension. It was recommended to continue IV diuresis. In follow-up 09/10/23 she did not tolerate hydralazine due to nausea. Lasix was increased to 20mg  BID and metoprolol changed to succinate. pBNP 09/24/23 was still very high so Lasix further increased to 40mg  BID. She was then seen in the ED 09/27/23 with hallucinations. Labs showed AKI. Dr. Rosemary Holms  switched Lasix to Bumex in follow-up phone notes.  AKI No lipids on file Neuro eval   Chronic HFrEF/NICM with pulmonary HTN, mild-mod TR, mild AI/AS Nononbstructive CAD Permanent atrial fibrillation AKI on CKD 3b   Labwork independently reviewed: 09/2023 TSH wnl, K 4.0, Cr 1.80 (worsening), LFTs ok, Hgb 14, plt ok, pBNP 17k 08/2023 Mg 1.8  ROS: .    Please see the history of present illness. Otherwise, review of systems is positive for ***.  All other systems are reviewed and otherwise negative.  Studies Reviewed: Marland Kitchen    EKG:  EKG is ordered today, personally reviewed, demonstrating ***  CV Studies: Cardiac studies reviewed are outlined and summarized above. Otherwise please see EMR for full report.   Current Reported Medications:.    No outpatient medications have been marked as taking for the 10/09/23 encounter (Appointment) with Laurann Montana, PA-C.    Physical Exam:    VS:  There were no vitals taken for this visit.   Wt Readings from Last 3 Encounters:  09/24/23 211 lb 3.2 oz (95.8 kg)  09/10/23 214 lb 12.8 oz (97.4 kg)  09/02/23 209 lb 6.4 oz (95 kg)    GEN: Well nourished, well developed in no acute distress NECK: No JVD; No carotid bruits CARDIAC: ***RRR, no murmurs, rubs, gallops RESPIRATORY:  Clear to auscultation without rales, wheezing or rhonchi  ABDOMEN: Soft, non-tender, non-distended EXTREMITIES:  No edema; No acute deformity   Asessement and Plan:.     ***     Disposition: F/u with ***  Signed, Laurann Montana, PA-C

## 2023-10-08 ENCOUNTER — Emergency Department (HOSPITAL_COMMUNITY): Payer: Medicare HMO

## 2023-10-08 ENCOUNTER — Observation Stay (HOSPITAL_COMMUNITY): Payer: Medicare HMO

## 2023-10-08 ENCOUNTER — Encounter (HOSPITAL_COMMUNITY): Payer: Self-pay | Admitting: Pharmacy Technician

## 2023-10-08 ENCOUNTER — Encounter: Payer: Self-pay | Admitting: Cardiology

## 2023-10-08 ENCOUNTER — Other Ambulatory Visit: Payer: Self-pay

## 2023-10-08 ENCOUNTER — Inpatient Hospital Stay (HOSPITAL_COMMUNITY)
Admission: EM | Admit: 2023-10-08 | Discharge: 2023-10-14 | DRG: 280 | Disposition: A | Discharge status: Home Health | Payer: Medicare HMO | Attending: Internal Medicine | Admitting: Internal Medicine

## 2023-10-08 DIAGNOSIS — I509 Heart failure, unspecified: Secondary | ICD-10-CM | POA: Diagnosis not present

## 2023-10-08 DIAGNOSIS — K7682 Hepatic encephalopathy: Secondary | ICD-10-CM | POA: Diagnosis present

## 2023-10-08 DIAGNOSIS — I21A1 Myocardial infarction type 2: Secondary | ICD-10-CM | POA: Diagnosis present

## 2023-10-08 DIAGNOSIS — E039 Hypothyroidism, unspecified: Secondary | ICD-10-CM | POA: Diagnosis not present

## 2023-10-08 DIAGNOSIS — I130 Hypertensive heart and chronic kidney disease with heart failure and stage 1 through stage 4 chronic kidney disease, or unspecified chronic kidney disease: Secondary | ICD-10-CM | POA: Diagnosis not present

## 2023-10-08 DIAGNOSIS — I5033 Acute on chronic diastolic (congestive) heart failure: Secondary | ICD-10-CM | POA: Diagnosis present

## 2023-10-08 DIAGNOSIS — R918 Other nonspecific abnormal finding of lung field: Secondary | ICD-10-CM | POA: Diagnosis not present

## 2023-10-08 DIAGNOSIS — K761 Chronic passive congestion of liver: Secondary | ICD-10-CM | POA: Diagnosis present

## 2023-10-08 DIAGNOSIS — N1832 Chronic kidney disease, stage 3b: Secondary | ICD-10-CM | POA: Diagnosis not present

## 2023-10-08 DIAGNOSIS — Z96652 Presence of left artificial knee joint: Secondary | ICD-10-CM | POA: Diagnosis present

## 2023-10-08 DIAGNOSIS — I350 Nonrheumatic aortic (valve) stenosis: Secondary | ICD-10-CM | POA: Diagnosis present

## 2023-10-08 DIAGNOSIS — E871 Hypo-osmolality and hyponatremia: Secondary | ICD-10-CM | POA: Diagnosis present

## 2023-10-08 DIAGNOSIS — R7989 Other specified abnormal findings of blood chemistry: Secondary | ICD-10-CM | POA: Diagnosis present

## 2023-10-08 DIAGNOSIS — R404 Transient alteration of awareness: Secondary | ICD-10-CM | POA: Diagnosis not present

## 2023-10-08 DIAGNOSIS — R410 Disorientation, unspecified: Secondary | ICD-10-CM | POA: Diagnosis not present

## 2023-10-08 DIAGNOSIS — I6782 Cerebral ischemia: Secondary | ICD-10-CM | POA: Diagnosis not present

## 2023-10-08 DIAGNOSIS — R9431 Abnormal electrocardiogram [ECG] [EKG]: Secondary | ICD-10-CM | POA: Diagnosis not present

## 2023-10-08 DIAGNOSIS — I4819 Other persistent atrial fibrillation: Secondary | ICD-10-CM | POA: Diagnosis not present

## 2023-10-08 DIAGNOSIS — I5020 Unspecified systolic (congestive) heart failure: Secondary | ICD-10-CM

## 2023-10-08 DIAGNOSIS — Z7901 Long term (current) use of anticoagulants: Secondary | ICD-10-CM

## 2023-10-08 DIAGNOSIS — Z6834 Body mass index (BMI) 34.0-34.9, adult: Secondary | ICD-10-CM

## 2023-10-08 DIAGNOSIS — I110 Hypertensive heart disease with heart failure: Secondary | ICD-10-CM | POA: Diagnosis not present

## 2023-10-08 DIAGNOSIS — I5023 Acute on chronic systolic (congestive) heart failure: Secondary | ICD-10-CM | POA: Diagnosis present

## 2023-10-08 DIAGNOSIS — J984 Other disorders of lung: Secondary | ICD-10-CM | POA: Diagnosis not present

## 2023-10-08 DIAGNOSIS — E7220 Disorder of urea cycle metabolism, unspecified: Secondary | ICD-10-CM | POA: Diagnosis not present

## 2023-10-08 DIAGNOSIS — Z823 Family history of stroke: Secondary | ICD-10-CM

## 2023-10-08 DIAGNOSIS — R0602 Shortness of breath: Secondary | ICD-10-CM | POA: Diagnosis not present

## 2023-10-08 DIAGNOSIS — I5043 Acute on chronic combined systolic (congestive) and diastolic (congestive) heart failure: Secondary | ICD-10-CM | POA: Diagnosis present

## 2023-10-08 DIAGNOSIS — I428 Other cardiomyopathies: Secondary | ICD-10-CM | POA: Diagnosis present

## 2023-10-08 DIAGNOSIS — E669 Obesity, unspecified: Secondary | ICD-10-CM | POA: Diagnosis present

## 2023-10-08 DIAGNOSIS — I2489 Other forms of acute ischemic heart disease: Secondary | ICD-10-CM | POA: Diagnosis present

## 2023-10-08 DIAGNOSIS — Z79890 Hormone replacement therapy (postmenopausal): Secondary | ICD-10-CM

## 2023-10-08 DIAGNOSIS — I4821 Permanent atrial fibrillation: Secondary | ICD-10-CM | POA: Diagnosis present

## 2023-10-08 DIAGNOSIS — Z79899 Other long term (current) drug therapy: Secondary | ICD-10-CM

## 2023-10-08 DIAGNOSIS — I10 Essential (primary) hypertension: Secondary | ICD-10-CM | POA: Diagnosis present

## 2023-10-08 DIAGNOSIS — R9089 Other abnormal findings on diagnostic imaging of central nervous system: Secondary | ICD-10-CM | POA: Diagnosis not present

## 2023-10-08 DIAGNOSIS — I700 Atherosclerosis of aorta: Secondary | ICD-10-CM | POA: Diagnosis not present

## 2023-10-08 DIAGNOSIS — E440 Moderate protein-calorie malnutrition: Secondary | ICD-10-CM | POA: Insufficient documentation

## 2023-10-08 DIAGNOSIS — I2510 Atherosclerotic heart disease of native coronary artery without angina pectoris: Secondary | ICD-10-CM | POA: Diagnosis present

## 2023-10-08 DIAGNOSIS — F0392 Unspecified dementia, unspecified severity, with psychotic disturbance: Secondary | ICD-10-CM | POA: Diagnosis present

## 2023-10-08 DIAGNOSIS — Z8249 Family history of ischemic heart disease and other diseases of the circulatory system: Secondary | ICD-10-CM

## 2023-10-08 DIAGNOSIS — E785 Hyperlipidemia, unspecified: Secondary | ICD-10-CM | POA: Diagnosis present

## 2023-10-08 DIAGNOSIS — R443 Hallucinations, unspecified: Secondary | ICD-10-CM | POA: Diagnosis present

## 2023-10-08 DIAGNOSIS — I447 Left bundle-branch block, unspecified: Secondary | ICD-10-CM | POA: Diagnosis present

## 2023-10-08 DIAGNOSIS — I2722 Pulmonary hypertension due to left heart disease: Secondary | ICD-10-CM | POA: Diagnosis present

## 2023-10-08 DIAGNOSIS — G9341 Metabolic encephalopathy: Secondary | ICD-10-CM | POA: Diagnosis present

## 2023-10-08 LAB — URINALYSIS, W/ REFLEX TO CULTURE (INFECTION SUSPECTED)
Bilirubin Urine: NEGATIVE
Glucose, UA: NEGATIVE mg/dL
Hgb urine dipstick: NEGATIVE
Ketones, ur: NEGATIVE mg/dL
Leukocytes,Ua: NEGATIVE
Nitrite: NEGATIVE
Protein, ur: NEGATIVE mg/dL
Specific Gravity, Urine: 1.008 (ref 1.005–1.030)
pH: 6 (ref 5.0–8.0)

## 2023-10-08 LAB — COMPREHENSIVE METABOLIC PANEL
ALT: 15 U/L (ref 0–44)
AST: 20 U/L (ref 15–41)
Albumin: 3.4 g/dL — ABNORMAL LOW (ref 3.5–5.0)
Alkaline Phosphatase: 42 U/L (ref 38–126)
Anion gap: 9 (ref 5–15)
BUN: 24 mg/dL — ABNORMAL HIGH (ref 8–23)
CO2: 29 mmol/L (ref 22–32)
Calcium: 9.2 mg/dL (ref 8.9–10.3)
Chloride: 102 mmol/L (ref 98–111)
Creatinine, Ser: 1.62 mg/dL — ABNORMAL HIGH (ref 0.44–1.00)
GFR, Estimated: 32 mL/min — ABNORMAL LOW (ref >60)
Glucose, Bld: 109 mg/dL — ABNORMAL HIGH (ref 70–99)
Potassium: 3.6 mmol/L (ref 3.5–5.1)
Sodium: 140 mmol/L (ref 135–145)
Total Bilirubin: 0.7 mg/dL (ref <1.2)
Total Protein: 6.3 g/dL — ABNORMAL LOW (ref 6.5–8.1)

## 2023-10-08 LAB — BLOOD GAS, VENOUS
Acid-Base Excess: 11.8 mmol/L — ABNORMAL HIGH (ref 0.0–2.0)
Bicarbonate: 38.5 mmol/L — ABNORMAL HIGH (ref 20.0–28.0)
O2 Saturation: 32.8 %
Patient temperature: 37
pCO2, Ven: 58 mm[Hg] (ref 44–60)
pH, Ven: 7.43 (ref 7.25–7.43)
pO2, Ven: <31 mm[Hg] — CL (ref 32–45)

## 2023-10-08 LAB — CBC
HCT: 44.5 % (ref 36.0–46.0)
Hemoglobin: 13.8 g/dL (ref 12.0–15.0)
MCH: 26.3 pg (ref 26.0–34.0)
MCHC: 31 g/dL (ref 30.0–36.0)
MCV: 84.9 fL (ref 80.0–100.0)
Platelets: 274 10*3/uL (ref 150–400)
RBC: 5.24 MIL/uL — ABNORMAL HIGH (ref 3.87–5.11)
RDW: 14.9 % (ref 11.5–15.5)
WBC: 7.4 10*3/uL (ref 4.0–10.5)
nRBC: 0 % (ref 0.0–0.2)

## 2023-10-08 LAB — TSH: TSH: 0.933 u[IU]/mL (ref 0.350–4.500)

## 2023-10-08 LAB — CBG MONITORING, ED: Glucose-Capillary: 102 mg/dL — ABNORMAL HIGH (ref 70–99)

## 2023-10-08 LAB — RAPID URINE DRUG SCREEN, HOSP PERFORMED
Amphetamines: NOT DETECTED
Barbiturates: NOT DETECTED
Benzodiazepines: NOT DETECTED
Cocaine: NOT DETECTED
Opiates: NOT DETECTED
Tetrahydrocannabinol: NOT DETECTED

## 2023-10-08 LAB — PROCALCITONIN: Procalcitonin: <0.1 ng/mL

## 2023-10-08 LAB — PHOSPHORUS: Phosphorus: 3.5 mg/dL (ref 2.5–4.6)

## 2023-10-08 LAB — AMMONIA: Ammonia: 80 umol/L — ABNORMAL HIGH (ref 9–35)

## 2023-10-08 LAB — ETHANOL: Alcohol, Ethyl (B): <10 mg/dL (ref <10)

## 2023-10-08 LAB — BRAIN NATRIURETIC PEPTIDE: B Natriuretic Peptide: 1027.2 pg/mL — ABNORMAL HIGH (ref 0.0–100.0)

## 2023-10-08 LAB — TROPONIN I (HIGH SENSITIVITY)
Troponin I (High Sensitivity): 34 ng/L — ABNORMAL HIGH (ref <18)
Troponin I (High Sensitivity): 33 ng/L — ABNORMAL HIGH (ref <18)
Troponin I (High Sensitivity): 23 ng/L — ABNORMAL HIGH (ref <18)

## 2023-10-08 LAB — CK: Total CK: 44 U/L (ref 38–234)

## 2023-10-08 LAB — MAGNESIUM: Magnesium: 2 mg/dL (ref 1.7–2.4)

## 2023-10-08 MED ORDER — GUAIFENESIN ER 600 MG PO TB12
600.0000 mg | ORAL_TABLET | Freq: Two times a day (BID) | ORAL | Status: DC
  Administered 2023-10-09 – 2023-10-14 (×12): 600 mg via ORAL
  Filled 2023-10-08 (×12): qty 1

## 2023-10-08 MED ORDER — SODIUM CHLORIDE 0.9% FLUSH
3.0000 mL | INTRAVENOUS | Status: DC | PRN
Start: 2023-10-08 — End: 2023-10-09

## 2023-10-08 MED ORDER — FUROSEMIDE 10 MG/ML IJ SOLN: 40.0000 mg | Freq: Two times a day (BID) | INTRAMUSCULAR | Status: DC

## 2023-10-08 MED ORDER — ISOSORBIDE MONONITRATE ER 30 MG PO TB24
30.0000 mg | ORAL_TABLET | Freq: Every day | ORAL | Status: DC
  Administered 2023-10-09 – 2023-10-14 (×6): 30 mg via ORAL
  Filled 2023-10-08 (×7): qty 1

## 2023-10-08 MED ORDER — ACETAMINOPHEN 650 MG RE SUPP: 650.0000 mg | Freq: Four times a day (QID) | RECTAL | Status: DC | PRN

## 2023-10-08 MED ORDER — LEVOTHYROXINE SODIUM 25 MCG PO TABS
125.0000 ug | ORAL_TABLET | Freq: Every day | ORAL | Status: DC
  Administered 2023-10-10 – 2023-10-14 (×5): 125 ug via ORAL
  Filled 2023-10-08 (×6): qty 1

## 2023-10-08 MED ORDER — METOPROLOL SUCCINATE ER 100 MG PO TB24
100.0000 mg | ORAL_TABLET | Freq: Every day | ORAL | Status: DC
  Administered 2023-10-09 – 2023-10-14 (×6): 100 mg via ORAL
  Filled 2023-10-08: qty 4
  Filled 2023-10-08 (×5): qty 1

## 2023-10-08 MED ORDER — ALBUTEROL SULFATE (2.5 MG/3ML) 0.083% IN NEBU: 2.5000 mg | INHALATION_SOLUTION | RESPIRATORY_TRACT | Status: DC | PRN

## 2023-10-08 MED ORDER — FUROSEMIDE 10 MG/ML IJ SOLN
60.0000 mg | Freq: Once | INTRAMUSCULAR | Status: AC
  Administered 2023-10-08: 60 mg via INTRAVENOUS
  Filled 2023-10-08: qty 6

## 2023-10-08 MED ORDER — SODIUM CHLORIDE 0.9% FLUSH
3.0000 mL | Freq: Two times a day (BID) | INTRAVENOUS | Status: DC
  Administered 2023-10-09: 3 mL via INTRAVENOUS

## 2023-10-08 MED ORDER — PRAVASTATIN SODIUM 40 MG PO TABS: 80.0000 mg | ORAL_TABLET | Freq: Every morning | ORAL | Status: DC

## 2023-10-08 MED ORDER — PRAVASTATIN SODIUM 40 MG PO TABS
80.0000 mg | ORAL_TABLET | Freq: Every day | ORAL | Status: DC
  Administered 2023-10-09 – 2023-10-13 (×4): 80 mg via ORAL
  Filled 2023-10-08 (×5): qty 2

## 2023-10-08 MED ORDER — TRAZODONE HCL 100 MG PO TABS
100.0000 mg | ORAL_TABLET | Freq: Every day | ORAL | Status: DC
  Administered 2023-10-09 (×2): 100 mg via ORAL
  Filled 2023-10-08: qty 2
  Filled 2023-10-08: qty 1

## 2023-10-08 MED ORDER — ACETAMINOPHEN 325 MG PO TABS: 650.0000 mg | ORAL_TABLET | Freq: Four times a day (QID) | ORAL | Status: DC | PRN

## 2023-10-08 MED ORDER — APIXABAN 5 MG PO TABS
5.0000 mg | ORAL_TABLET | Freq: Two times a day (BID) | ORAL | Status: DC
  Administered 2023-10-09 – 2023-10-14 (×12): 5 mg via ORAL
  Filled 2023-10-08 (×12): qty 1

## 2023-10-08 MED ORDER — LACTULOSE 10 GM/15ML PO SOLN
30.0000 g | Freq: Once | ORAL | Status: AC
  Administered 2023-10-08: 30 g via ORAL
  Filled 2023-10-08 (×2): qty 45

## 2023-10-08 MED ORDER — SODIUM CHLORIDE 0.9 % IV SOLN
250.0000 mL | INTRAVENOUS | Status: DC | PRN
Start: 2023-10-08 — End: 2023-10-09

## 2023-10-08 MED ORDER — POTASSIUM CHLORIDE CRYS ER 20 MEQ PO TBCR
40.0000 meq | EXTENDED_RELEASE_TABLET | Freq: Once | ORAL | Status: AC
  Administered 2023-10-08: 40 meq via ORAL
  Filled 2023-10-08: qty 2

## 2023-10-08 NOTE — ED Triage Notes (Signed)
Pt bib family member with reports of confusion and hallucinations. States hallucinations have gotten worse. Pt also endorses bil lower extremity edema. Daughter states she has recently had medication changes.

## 2023-10-08 NOTE — Telephone Encounter (Signed)
I am very sorry. I agree, this is concerning? Has she seen Dr. Timothy Lasso? I had personally discussed with him re: these episodes.  Thanks MJP

## 2023-10-08 NOTE — Subjective & Objective (Signed)
Worsening bilateral edema On Lasix at home has been having confusion and hallucinations was switched to Bumex and at worsened edema Worsening agitation and sundowing Cough for the past 2 wks  Tried trazodone with no improvement Intermittent SOB

## 2023-10-08 NOTE — ED Provider Notes (Addendum)
[place] EMERGENCY DEPARTMENT AT [facility] Provider Note   CSN: [id number] Arrival date & time: 10/08/23  1337     History  Chief Complaint  Patient presents with   Altered Mental Status    [name] is a 77 y.o. female.   Altered Mental Status Presenting symptoms: confusion   Associated symptoms: hallucinations   Patient presents for worsening BLE edema, worsened hallucinations, cough.  Medical history includes CHF, CKD, HLD, HTN, hypothyroidism, osteopenia, arthritis, atrial fibrillation, GERD.  Home medications include Eliquis, Synthroid.  She was seen by PCP a week ago.  She had delusions and hallucinations at the time.  There was concern of sundowning.  She was started on trazodone at nighttime.  Lasix was changed to Bumex.  She has since had better sleep and less hallucinations until yesterday.  She had agitation last night, worsened confusion, and worsened hallucinations.  She has had worsened BLE swelling.  She has had a worsening cough over the past 2 weeks.  She states that she will feel intermittently short of breath.     Home Medications Prior to Admission medications   Medication Sig Start Date End Date Taking? Authorizing Provider  hydrALAZINE (APRESOLINE) 10 MG tablet Take 10 mg by mouth 3 (three) times daily. 10/03/23  Yes [provider]  acetaminophen (TYLENOL) 325 MG tablet Take 325-650 mg by mouth daily as needed for mild pain or headache.    [provider]  bumetanide (BUMEX) 1 MG tablet Take 1 tablet (1 mg total) by mouth daily. 10/02/23   Patwardhan, Anabel Bene, MD  Calcium Carb-Cholecalciferol (CALTRATE 600+D3 PO) Take 1 tablet by mouth every evening.    [provider]  [employer] IRON 325 (65 Fe) MG tablet Take 325 mg by mouth daily at 6 PM. 01/16/21   [provider]  ELIQUIS 5 MG TABS tablet TAKE 1 TABLET BY MOUTH TWICE A DAY Patient taking differently: Take 5 mg by mouth 2 (two) times daily. 07/07/23    Patwardhan, Anabel Bene, MD  isosorbide mononitrate (IMDUR) 30 MG 24 hr tablet Take 1 tablet (30 mg total) by mouth daily. 09/24/23   Patwardhan, Anabel Bene, MD  levothyroxine (SYNTHROID, LEVOTHROID) 125 MCG tablet Take 125 mcg by mouth daily before breakfast. 06/19/12   [provider]  metoprolol succinate (TOPROL-XL) 100 MG 24 hr tablet Take 1 tablet (100 mg total) by mouth daily. Take with or immediately following a meal. 09/10/23   Weaver, Lorin Picket T, PA-C  omeprazole (PRILOSEC) 20 MG capsule TAKE 1 CAPSULE BY MOUTH EVERY DAY Patient taking differently: Take 20 mg by mouth daily before breakfast. 12/21/21   Hilarie Fredrickson, MD  Oxymetazoline HCl (SINEX ULTRA FINE MIST 12-HOUR NA) Place 1 spray into both nostrils every 12 (twelve) hours as needed (for congestion).    [provider]  pravastatin (PRAVACHOL) 80 MG tablet Take 80 mg by mouth in the morning. 07/16/12   [provider]  traZODone (DESYREL) 50 MG tablet Take 50 mg by mouth daily. 10/03/23   [provider]  VISINE 0.025-0.3 % ophthalmic solution Place 1 drop into both eyes 2 (two) times daily as needed for eye irritation.    [provider]  vitamin B-12 (CYANOCOBALAMIN) 1000 MCG tablet Take 1,000 mcg by mouth daily. 01/16/21   [provider]      Allergies    Spironolactone and Atorvastatin    Review of Systems   Review of Systems  Constitutional:  Positive for fatigue.  Respiratory:  Positive for cough and shortness of breath.   Cardiovascular:  Positive for leg swelling.  Psychiatric/Behavioral:  Positive for confusion and hallucinations.   All other systems reviewed and are negative.   Physical Exam Updated Vital Signs BP 137/88   Pulse 95   Temp (!) 97.5 F (36.4 C) (Axillary)   Resp 19   SpO2 97%  Physical Exam Vitals and nursing note reviewed.  Constitutional:      General: She is not in acute distress.    Appearance: Normal appearance. She is well-developed. She is  not ill-appearing, toxic-appearing or diaphoretic.  HENT:     Head: Normocephalic and atraumatic.     Right Ear: External ear normal.     Left Ear: External ear normal.     Nose: Nose normal.     Mouth/Throat:     Mouth: Mucous membranes are moist.  Eyes:     Extraocular Movements: Extraocular movements intact.     Conjunctiva/sclera: Conjunctivae normal.  Cardiovascular:     Rate and Rhythm: Normal rate and regular rhythm.     Heart sounds: No murmur heard. Pulmonary:     Effort: Pulmonary effort is normal. No respiratory distress.     Breath sounds: Rales present. No wheezing.  Abdominal:     General: There is no distension.     Palpations: Abdomen is soft.     Tenderness: There is no abdominal tenderness.  Musculoskeletal:        General: No swelling. Normal range of motion.     Cervical back: Normal range of motion and neck supple.     Right lower leg: Edema present.     Left lower leg: Edema present.  Skin:    General: Skin is warm and dry.     Coloration: Skin is not jaundiced or pale.  Neurological:     General: No focal deficit present.     Mental Status: She is alert and oriented to person, place, and time.     Cranial Nerves: No cranial nerve deficit.     Sensory: No sensory deficit.     Motor: No weakness.     Coordination: Coordination normal.  Psychiatric:        Mood and Affect: Mood normal.        Behavior: Behavior normal.     ED Results / Procedures / Treatments   Labs (all labs ordered are listed, but only abnormal results are displayed) Labs Reviewed  COMPREHENSIVE METABOLIC PANEL - Abnormal; Notable for the following components:      Result Value   Glucose, Bld 109 (*)    BUN 24 (*)    Creatinine, Ser 1.62 (*)    Total Protein 6.3 (*)    Albumin 3.4 (*)    GFR, Estimated 32 (*)    All other components within normal limits  CBC - Abnormal; Notable for the following components:   RBC 5.24 (*)    All other components within normal limits   URINALYSIS, W/ REFLEX TO CULTURE (INFECTION SUSPECTED) - Abnormal; Notable for the following components:   Color, Urine STRAW (*)    Bacteria, UA RARE (*)    All other components within normal limits  AMMONIA - Abnormal; Notable for the following components:   Ammonia 80 (*)    All other components within normal limits  BRAIN NATRIURETIC PEPTIDE - Abnormal; Notable for the following components:   B Natriuretic Peptide 1,027.2 (*)    All other components within normal limits  CBG MONITORING, ED - Abnormal; Notable for the following components:   Glucose-Capillary 102 (*)    All other components within normal limits  TROPONIN I (HIGH SENSITIVITY) - Abnormal; Notable for the following components:   Troponin I (High Sensitivity) 34 (*)    All other components within normal limits  ETHANOL  MAGNESIUM  RAPID URINE DRUG SCREEN, HOSP PERFORMED  TSH  TROPONIN I (HIGH SENSITIVITY)    EKG EKG Interpretation Date/Time:  Wednesday October 08 2023 14:03:02 EST Ventricular Rate:  107 PR Interval:    QRS Duration:  150 QT Interval:  348 QTC Calculation: 464 R Axis:   -8  Text Interpretation: Atrial fibrillation with rapid ventricular response Left bundle branch block Abnormal ECG Confirmed by Gloris Manchester ([id number]) on 10/08/2023 6:20:51 PM  Radiology CT HEAD WO CONTRAST  Result Date: 10/08/2023 CLINICAL DATA:  Altered level of consciousness, confusion, hallucinations EXAM: CT HEAD WITHOUT CONTRAST TECHNIQUE: Contiguous axial images were obtained from the base of the skull through the vertex without intravenous contrast. RADIATION DOSE REDUCTION: This exam was performed according to the departmental dose-optimization program which includes automated exposure control, adjustment of the mA and/or kV according to patient size and/or use of iterative reconstruction technique. COMPARISON:  09/27/2023 FINDINGS: Brain: No evidence of acute infarct or hemorrhage. Lateral ventricles and midline structures  are stable. No acute extra-axial fluid collections. No mass effect. Vascular: No hyperdense vessel or unexpected calcification. Skull: Normal. Negative for fracture or focal lesion. Sinuses/Orbits: No acute finding.  Stable right mastoid effusion. Other: None. IMPRESSION: 1. Stable head CT, no acute intracranial process. Electronically Signed   By: Sharlet Salina M.D.   On: 10/08/2023 18:20   DG Chest 2 View  Result Date: 10/08/2023 CLINICAL DATA:  Shortness of breath. EXAM: CHEST - 2 VIEW COMPARISON:  September 26, 2023. FINDINGS: Stable cardiomediastinal silhouette. Stable reticular densities are noted throughout both lungs which may represent scarring, but acute superimposed edema or atypical inflammation cannot be excluded. Bony thorax is unremarkable. IMPRESSION: Stable reticular densities are noted throughout both lungs which may represent scarring, but acute superimposed edema or atypical inflammation cannot be excluded. Aortic Atherosclerosis (ICD10-I70.0). Electronically Signed   By: Lupita Raider M.D.   On: 10/08/2023 16:55    Procedures Procedures    Medications Ordered in ED Medications  lactulose (CHRONULAC) 10 GM/15ML solution 30 g (has no administration in time range)  potassium chloride SA (KLOR-CON M) CR tablet 40 mEq (40 mEq Oral Given 10/08/23 1748)  furosemide (LASIX) injection 60 mg (60 mg Intravenous Given 10/08/23 1748)    ED Course/ Medical Decision Making/ A&P                                 Medical Decision Making Amount and/or Complexity of Data Reviewed Labs: ordered. Radiology: ordered.  Risk Prescription drug management. Decision regarding hospitalization.   This patient presents to the ED for concern of leg swelling and hallucinations, this involves an extensive number of treatment options, and is a complaint that carries with it a high risk of complications and morbidity.  The differential diagnosis includes neurocognitive disorder, delirium, infection,  metabolic derangements, CHF exacerbation, failure of outpatient therapy   Co morbidities that complicate the patient evaluation  CHF, CKD, HLD, HTN, hypothyroidism, osteopenia, arthritis, atrial fibrillation, GERD   Additional history obtained:  Additional history obtained from patient's family External records from outside source obtained and reviewed including EMR  Lab Tests:  I Ordered, and personally interpreted labs.  The pertinent results include: Creatinine is baseline.  Electrolytes are normal.  Troponin and BNP are elevated consistent with CHF.  There is no clear evidence of UTI.  No leukocytosis is present.  There is an unexplained hyperammonemia which may be contributing to her confusion.   Imaging Studies ordered:  I ordered imaging studies including chest x-ray, CT head I independently visualized and interpreted imaging which showed reticular densities throughout both lungs on x-ray.  CT head did not show acute findings I agree with the radiologist interpretation   Cardiac Monitoring: / EKG:  The patient was maintained on a cardiac monitor.  I personally viewed and interpreted the cardiac monitored which showed an underlying rhythm of: Atrial fibrillation   Problem List / ED Course / Critical interventions / Medication management  Patient presents for multiple complaints.  She has been having symptoms of delirium, including hallucinations.  They seem to occur more in the evening time.  Patient is concerned that it may be from her recent increases in Lasix.  Because of these concerns, she was recently switched to Bumex.  She has had worsening BLE swelling since then.  Family reports a particularly severe episode of agitation and confusion last night.  Currently, she is not hallucinating.  She is alert and oriented.  She does not have neurologic deficits on exam.  She does have pitting edema on her lower legs.  Currently, her breathing is unlabored.  She was placed on  cardiac monitor.  EKG shows atrial fibrillation.  Workup was initiated.  Potassium was on the low side of normal.  Potassium supplement was ordered in anticipation of diuresis.  IV Lasix was ordered.  Interestingly, her ammonia was elevated.  This may be contributing to her recent episodes of confusion.  Lactulose was ordered.  I spoke with cardiologist, Dr. Rosemary Holms, who requested hospitalist admission.  Cardiology will see in consult.  CT head did not show acute findings.  Chest x-ray did show some bilateral reticular densities.  This is likely fluid secondary to CHF exacerbation in the setting of decreased use of diuretics at home.  Will defer antibiotics for now.  Patient was admitted for further management. I ordered medication including potassium chloride for electrolyte optimization; Lasix for diuresis; lactulose for hyperammonemia Reevaluation of the patient after these medicines showed that the patient improved I have reviewed the patients home medicines and have made adjustments as needed   Social Determinants of Health:  Lives at home with family, has access to outpatient care        Final Clinical Impression(s) / ED Diagnoses Final diagnoses:  Acute on chronic congestive heart failure, unspecified heart failure type (HCC)  Delirium  Hyperammonemia [facility])    Rx / DC Orders ED Discharge Orders     None         Gloris Manchester, MD 10/08/23 Ayesha Mohair    Gloris Manchester, MD 10/08/23 [phone]

## 2023-10-08 NOTE — H&P (Signed)
[name] NFA:[id number] DOB: 1946-04-13 DOA: 10/08/2023     PCP: Creola Corn, MD   Outpatient Specialists:  CARDS:   Dr. Elder Negus, MD   Patient arrived to ER on 10/08/23 at 1337 Referred by Attending Gloris Manchester, MD   Patient coming from:    home Lives  With family    Chief Complaint:   Chief Complaint  Patient presents with   Altered Mental Status    HPI: [name] is a 77 y.o. female with medical history significant of systolic CHF, severe pulmonary hypertension with mPAP 47 mmHg, hyponatremia, A. Fib on Eliquis, CKD 3b, goiter, HLD, HTN, hypothyrodism, obesity  Presented with   confusion and edema Worsening bilateral edema On Lasix at home has been having confusion and hallucinations was switched to Bumex and at worsened edema Worsening agitation and sundowing Cough for the past 2 wks  Tried trazodone with no improvement Intermittent SOB    Has been having visual hallucinations, of people she knows and does not as well as animals The hallucinations been ongoing and recurrent for the past 1-2 wks  The hallucinations have been frightening    Denies significant ETOH intake   Does not smoke   Lab Results  Component Value Date   SARSCOV2NAA NEGATIVE 06/19/2021   SARSCOV2NAA NEGATIVE 01/22/2021   SARSCOV2NAA RESULT:  NEGATIVE 12/27/2019   Regarding pertinent Chronic problems:    Hyperlipidemia - on statins Pravachol (pravastatin)      HTN on imdur, Toprol   chronic CHF  systolic  - last echo  Bumex  Recent Results (from the past 46962 hour(s))  ECHOCARDIOGRAM COMPLETE   Collection Time: 08/30/23  3:09 PM  Result Value   Weight 3,425.07   Height 66   BP 140/97   S' Lateral 2.65   AR max vel 0.57   AV Area VTI 0.48   AV Mean grad 11.7   AV Peak grad 20.9   Ao pk vel 2.29   P 1/2 time 604   Area-P 1/2 7.99   MV M vel 3.36   AV Area mean vel 0.52   MV Peak grad 45.2   Est EF 30 - 35%   Narrative      ECHOCARDIOGRAM REPORT        Patient Name:   [name] Date of Exam: 08/30/2023 Medical Rec #:  [id number]      Height:       66.0 in Accession #:    [id number]     Weight:       214.1 lb Date of Birth:  July 09, 1946       BSA:          2.058 m Patient Age:    59 years       BP:           158/86 mmHg Patient Gender: F              HR:           98 bpm. Exam Location:  Inpatient  Procedure: 2D Echo, Cardiac Doppler and Color Doppler  Indications:    CHF-Acute Diastolic I50.31   History:        Patient has prior history of Echocardiogram examinations, most                 recent 02/09/2021. CHF; Risk Factors:Hypertension and                 Dyslipidemia.  CKD, stage 3.   Sonographer:    Lucendia Herrlich Referring Phys: [id number] CALLIE E GOODRICH  IMPRESSIONS    1. Left ventricular ejection fraction, by estimation, is 30 to 35%. The left ventricle has moderately decreased function. The left ventricle demonstrates global hypokinesis. There is mild left ventricular hypertrophy. Left ventricular diastolic  parameters are indeterminate.  2. Right ventricular systolic function is moderately reduced. The right ventricular size is normal.  3. Left atrial size was severely dilated.  4. Right atrial size was severely dilated.  5. Moderate pleural effusion.  6. Trivial mitral valve regurgitation.  7. Tricuspid valve regurgitation is mild to moderate.  8. There is moderate calcification of the aortic valve. Aortic valve regurgitation is mild. Mild aortic valve stenosis.  9. The inferior vena cava is dilated in size with >50% respiratory variability, suggesting right atrial pressure of 8 mmHg.  Conclusion(s)/Recommendation(s): EF has declined compared to prior study.             Hypothyroidism:   Lab Results  Component Value Date   TSH 0.933 10/08/2023   on synthroid    obesity-   BMI Readings from Last 1 Encounters:  09/24/23 34.09 kg/m     A. Fib -   atrial fibrillation CHA2DS2 vas score 6       current  on anticoagulation with  Eliquis,           -  Rate control:  Currently controlled with  Toprolol,        CKD stage IIIb   baseline Cr  1.5 CrCl cannot be calculated (Unknown ideal weight.).  Lab Results  Component Value Date   CREATININE 1.62 (H) 10/08/2023   CREATININE 1.80 (H) 09/26/2023   CREATININE 1.42 (H) 09/24/2023   Lab Results  Component Value Date   NA 140 10/08/2023   CL 102 10/08/2023   K 3.6 10/08/2023   CO2 29 10/08/2023   BUN 24 (H) 10/08/2023   CREATININE 1.62 (H) 10/08/2023   GFRNONAA 32 (L) 10/08/2023   CALCIUM 9.2 10/08/2023   PHOS 4.6 06/20/2021   ALBUMIN 3.4 (L) 10/08/2023   GLUCOSE 109 (H) 10/08/2023     While in ER:    Called Cardiology who rec diuresis with LAsix and will see in AM    Lab Orders         Comprehensive metabolic panel         CBC         Urinalysis, w/ Reflex to Culture (Infection Suspected) -Urine, Clean Catch         Ammonia         Ethanol         Rapid urine drug screen (hospital performed)         Brain natriuretic peptide         TSH         Magnesium         CBG monitoring, ED      CT HEAD   NON acute     CXR - Stable reticular densities are noted throughout both lungs which may represent scarring, but acute superimposed edema or atypical inflammation cannot be excluded.   Following Medications were ordered in ER: Medications  potassium chloride SA (KLOR-CON M) CR tablet 40 mEq (40 mEq Oral Given 10/08/23 1748)  furosemide (LASIX) injection 60 mg (60 mg Intravenous Given 10/08/23 1748)  lactulose (CHRONULAC) 10 GM/15ML solution 30 g (30 g Oral Given 10/08/23 1927)    _______________________________________________________  ER Provider Called:    Cardiology    Dr. Rosemary Holms,  They Recommend admit to medicine        ED Triage Vitals  Encounter Vitals Group     BP 10/08/23 1357 122/82     Systolic BP Percentile --      Diastolic BP Percentile --      Pulse Rate 10/08/23 1357 (!) 106     Resp  10/08/23 1357 17     Temp 10/08/23 1357 98.1 F (36.7 C)     Temp Source 10/08/23 1821 Axillary     SpO2 10/08/23 1357 100 %     Weight --      Height --      Head Circumference --      Peak Flow --      Pain Score 10/08/23 1409 0     Pain Loc --      Pain Education --      Exclude from Growth Chart --   ZOXW(96)@     _________________________________________ Significant initial  Findings: Abnormal Labs Reviewed  COMPREHENSIVE METABOLIC PANEL - Abnormal; Notable for the following components:      Result Value   Glucose, Bld 109 (*)    BUN 24 (*)    Creatinine, Ser 1.62 (*)    Total Protein 6.3 (*)    Albumin 3.4 (*)    GFR, Estimated 32 (*)    All other components within normal limits  CBC - Abnormal; Notable for the following components:   RBC 5.24 (*)    All other components within normal limits  URINALYSIS, W/ REFLEX TO CULTURE (INFECTION SUSPECTED) - Abnormal; Notable for the following components:   Color, Urine STRAW (*)    Bacteria, UA RARE (*)    All other components within normal limits  AMMONIA - Abnormal; Notable for the following components:   Ammonia 80 (*)    All other components within normal limits  BRAIN NATRIURETIC PEPTIDE - Abnormal; Notable for the following components:   B Natriuretic Peptide 1,027.2 (*)    All other components within normal limits  CBG MONITORING, ED - Abnormal; Notable for the following components:   Glucose-Capillary 102 (*)    All other components within normal limits  TROPONIN I (HIGH SENSITIVITY) - Abnormal; Notable for the following components:   Troponin I (High Sensitivity) 34 (*)    All other components within normal limits    _________________________ Troponin  ordered Cardiac Panel (last 3 results) Recent Labs    10/08/23 1533 10/08/23 1733 10/08/23 2101 10/08/23 2142  CKTOTAL  --   --   --  44  TROPONINIHS 34* 33* 23*  --      ECG: Ordered Personally reviewed and interpreted by me showing: HR : 107 Rhythm:  Atrial fibrillation with rapid ventricular response Left bundle branch block Abnormal ECG QTC 483  BNP (last 3 results) Recent Labs    08/28/23 1336 10/08/23 1533  BNP 1,037.6* 1,027.2*     COVID-19 Labs  No results for input(s): "DDIMER", "FERRITIN", "LDH", "CRP" in the last 72 hours.  Lab Results  Component Value Date   SARSCOV2NAA NEGATIVE 06/19/2021   SARSCOV2NAA NEGATIVE 01/22/2021   SARSCOV2NAA RESULT:  NEGATIVE 12/27/2019     The recent clinical data is shown below. Vitals:   10/08/23 1357 10/08/23 1730 10/08/23 1821 10/08/23 1915  BP: 122/82 137/88  (!) 163/111  Pulse: (!) 106 95  (!) 120  Resp: 17 19  (!) 22  Temp:  98.1 F (36.7 C)  (!) 97.5 F (36.4 C)   TempSrc:   Axillary   SpO2: 100% 97%  94%    WBC     Component Value Date/Time   WBC 7.4 10/08/2023 1415   LYMPHSABS 0.8 09/26/2023 2209   MONOABS 0.8 09/26/2023 2209   EOSABS 0.1 09/26/2023 2209   BASOSABS 0.1 09/26/2023 2209         UA   no evidence of UTI      Urine analysis:    Component Value Date/Time   COLORURINE STRAW (A) 10/08/2023 1454   APPEARANCEUR CLEAR 10/08/2023 1454   LABSPEC 1.008 10/08/2023 1454   PHURINE 6.0 10/08/2023 1454   GLUCOSEU NEGATIVE 10/08/2023 1454   HGBUR NEGATIVE 10/08/2023 1454   BILIRUBINUR NEGATIVE 10/08/2023 1454   KETONESUR NEGATIVE 10/08/2023 1454   PROTEINUR NEGATIVE 10/08/2023 1454   UROBILINOGEN 0.2 01/31/2011 1208   NITRITE NEGATIVE 10/08/2023 1454   LEUKOCYTESUR NEGATIVE 10/08/2023 1454    Results for orders placed or performed during the hospital encounter of 08/28/23  C Difficile Quick Screen w PCR reflex     Status: None   Collection Time: 08/29/23 11:25 AM   Specimen: STOOL  Result Value Ref Range Status   C Diff antigen NEGATIVE NEGATIVE Final   C Diff toxin NEGATIVE NEGATIVE Final   C Diff interpretation No C. difficile detected.  Final    Comment: Performed at [facility], [address] [place], [place] [address]      Venous  Blood Gas result:  pH    7.43 Acid-Base Excess 11.8 High  mmol/L   pCO2, Ven 58 mmHg O2 Saturation 32.8 %  pO2, Ven <31 Low Panic  mmHg       __________________________________________________________ Recent Labs  Lab 10/08/23 1415 10/08/23 1533 10/08/23 2142  NA 140  --   --   K 3.6  --   --   CO2 29  --   --   GLUCOSE 109*  --   --   BUN 24*  --   --   CREATININE 1.62*  --   --   CALCIUM 9.2  --   --   MG  --  2.0  --   PHOS  --   --  3.5    Cr stable,    Lab Results  Component Value Date   CREATININE 1.62 (H) 10/08/2023   CREATININE 1.80 (H) 09/26/2023   CREATININE 1.42 (H) 09/24/2023    Recent Labs  Lab 10/08/23 1415  AST 20  ALT 15  ALKPHOS 42  BILITOT 0.7  PROT 6.3*  ALBUMIN 3.4*   Lab Results  Component Value Date   CALCIUM 9.2 10/08/2023   PHOS 4.6 06/20/2021    Plt: Lab Results  Component Value Date   PLT 274 10/08/2023      Recent Labs  Lab 10/08/23 1415  WBC 7.4  HGB 13.8  HCT 44.5  MCV 84.9  PLT 274    HG/HCT   stable,      Component Value Date/Time   HGB 13.8 10/08/2023 1415   HCT 44.5 10/08/2023 1415   MCV 84.9 10/08/2023 1415     No results for input(s): "LIPASE", "AMYLASE" in the last 168 hours. Recent Labs  Lab 10/08/23 1631  AMMONIA 80*    _______________________________________________ Hospitalist was called for admission for   Acute on chronic congestive heart failure,   Delirium  Hyperammonemia (HCC)     The following Work up  has been ordered so far:  Orders Placed This Encounter  Procedures   DG Chest 2 View   CT HEAD WO CONTRAST   Comprehensive metabolic panel   CBC   Urinalysis, w/ Reflex to Culture (Infection Suspected) -Urine, Clean Catch   Ammonia   Ethanol   Rapid urine drug screen (hospital performed)   Brain natriuretic peptide   TSH   Magnesium   Diet NPO time specified   Document Height and Actual Weight   Neuro checks q 2 hours x12 hours   ED Cardiac monitoring    Initiate Carrier Fluid Protocol   Consult to hospitalist   CBG monitoring, ED   ED EKG   Insert peripheral IV     OTHER Significant initial  Findings:  labs showing:     DM  labs:  HbA1C: Recent Labs    09/24/23 1556  HGBA1C 6.0*       CBG (last 3)  Recent Labs    10/08/23 1556  GLUCAP 102*          Cultures:    Component Value Date/Time   SDES PLEURAL FLUID LUNG LEFT 01/26/2021 1055   SDES PLEURAL FLUID LUNG LEFT 01/26/2021 1055   SPECREQUEST LUNG LEFT 01/26/2021 1055   SPECREQUEST LUNG LEFT 01/26/2021 1055   CULT  01/26/2021 1055    NO GROWTH 5 DAYS Performed at [facility], [address] [place], [place] [address]    REPTSTATUS 01/31/2021 FINAL 01/26/2021 1055   REPTSTATUS 01/26/2021 FINAL 01/26/2021 1055     Radiological Exams on Admission: CT HEAD WO CONTRAST  Result Date: 10/08/2023 CLINICAL DATA:  Altered level of consciousness, confusion, hallucinations EXAM: CT HEAD WITHOUT CONTRAST TECHNIQUE: Contiguous axial images were obtained from the base of the skull through the vertex without intravenous contrast. RADIATION DOSE REDUCTION: This exam was performed according to the departmental dose-optimization program which includes automated exposure control, adjustment of the mA and/or kV according to patient size and/or use of iterative reconstruction technique. COMPARISON:  09/27/2023 FINDINGS: Brain: No evidence of acute infarct or hemorrhage. Lateral ventricles and midline structures are stable. No acute extra-axial fluid collections. No mass effect. Vascular: No hyperdense vessel or unexpected calcification. Skull: Normal. Negative for fracture or focal lesion. Sinuses/Orbits: No acute finding.  Stable right mastoid effusion. Other: None. IMPRESSION: 1. Stable head CT, no acute intracranial process. Electronically Signed   By: Sharlet Salina M.D.   On: 10/08/2023 18:20   DG Chest 2 View  Result Date: 10/08/2023 CLINICAL DATA:  Shortness of breath.  EXAM: CHEST - 2 VIEW COMPARISON:  September 26, 2023. FINDINGS: Stable cardiomediastinal silhouette. Stable reticular densities are noted throughout both lungs which may represent scarring, but acute superimposed edema or atypical inflammation cannot be excluded. Bony thorax is unremarkable. IMPRESSION: Stable reticular densities are noted throughout both lungs which may represent scarring, but acute superimposed edema or atypical inflammation cannot be excluded. Aortic Atherosclerosis (ICD10-I70.0). Electronically Signed   By: Lupita Raider M.D.   On: 10/08/2023 16:55   _______________________________________________________________________________________________________ Latest  Blood pressure (!) 163/111, pulse (!) 120, temperature (!) 97.5 F (36.4 C), temperature source Axillary, resp. rate (!) 22, SpO2 94%.   Vitals  labs and radiology finding personally reviewed  Review of Systems:    Pertinent positives include:  Bilateral lower extremity swelling   Constitutional:  No weight loss, night sweats, Fevers, chills, fatigue, weight loss  HEENT:  No headaches, Difficulty swallowing,Tooth/dental problems,Sore throat,  No sneezing, itching, ear ache, nasal  congestion, post nasal drip,  Cardio-vascular:  No chest pain, Orthopnea, PND, anasarca, dizziness, palpitations.no  GI:  No heartburn, indigestion, abdominal pain, nausea, vomiting, diarrhea, change in bowel habits, loss of appetite, melena, blood in stool, hematemesis Resp:  no shortness of breath at rest. No dyspnea on exertion, No excess mucus, no productive cough, No non-productive cough, No coughing up of blood.No change in color of mucus.No wheezing. Skin:  no rash or lesions. No jaundice GU:  no dysuria, change in color of urine, no urgency or frequency. No straining to urinate.  No flank pain.  Musculoskeletal:  No joint pain or no joint swelling. No decreased range of motion. No back pain.  Psych:  No change in mood or  affect. No depression or anxiety. No memory loss.  Neuro: no localizing neurological complaints, no tingling, no weakness, no double vision, no gait abnormality, no slurred speech,    All systems reviewed and apart from HOPI all are negative _______________________________________________________________________________________________ Past Medical History:   Past Medical History:  Diagnosis Date   Arthritis    CHF (congestive heart failure) (HCC)    Chronic HFrEF (heart failure with reduced ejection fraction) (HCC)    CKD (chronic kidney disease), stage III (HCC)    Colon polyps    Goiter    Hyperglycemia    Hyperlipidemia    Hypertension    Hypothyroidism    Morbid obesity (HCC)    Osteopenia    Permanent atrial fibrillation (HCC)      Past Surgical History:  Procedure Laterality Date   CARDIOVASCULAR STRESS TEST     COLONOSCOPY     IR THORACENTESIS ASP PLEURAL SPACE W/IMG GUIDE  01/26/2021   LUMBAR DISC SURGERY  2012   PERICARDIOCENTESIS N/A 01/24/2021   Procedure: PERICARDIOCENTESIS;  Surgeon: Yates Decamp, MD;  Location[facility] INVASIVE CV LAB;  Service: Cardiovascular;  Laterality: N/A;   POLYPECTOMY     RIGHT/LEFT HEART CATH AND CORONARY ANGIOGRAPHY N/A 09/01/2023   Procedure: RIGHT/LEFT HEART CATH AND CORONARY ANGIOGRAPHY;  Surgeon: Marykay Lex, MD;  Location: [facility] INVASIVE CV LAB;  Service: Cardiovascular;  Laterality: N/A;   TOTAL KNEE ARTHROPLASTY Left 05/29/2017   Procedure: LEFT TOTAL KNEE ARTHROPLASTY;  Surgeon: Tarry Kos, MD;  Location: [facility] OR;  Service: Orthopedics;  Laterality: Left;   TUBAL LIGATION  1977   UPPER GASTROINTESTINAL ENDOSCOPY  12/29/2019   US ECHOCARDIOGRAPHY      Social History:  Ambulatory  walker      reports that she has never smoked. She has never used smokeless tobacco. She reports that she does not drink alcohol and does not use drugs.     Family History:   Family History  Problem Relation Age of Onset   CVA Mother    Heart  disease Brother    Heart disease Father    Colon cancer Neg Hx    Stomach cancer Neg Hx    Colon polyps Neg Hx    Esophageal cancer Neg Hx    Rectal cancer Neg Hx    ______________________________________________________________________________________________ Allergies: Allergies  Allergen Reactions   Spironolactone Other (See Comments)    Hyponatremia-  the level of sodium in blood is too high   Atorvastatin Other (See Comments)    "Aches"      Prior to Admission medications   Medication Sig Start Date End Date Taking? Authorizing Provider  acetaminophen (TYLENOL) 325 MG tablet Take 325-650 mg by mouth daily as needed for mild pain or headache.  [provider]  bumetanide (BUMEX) 1 MG tablet Take 1 tablet (1 mg total) by mouth daily. 10/02/23   Patwardhan, Anabel Bene, MD  Calcium Carb-Cholecalciferol (CALTRATE 600+D3 PO) Take 1 tablet by mouth every evening.    [provider]  [employer] IRON 325 (65 Fe) MG tablet Take 325 mg by mouth daily at 6 PM. 01/16/21   [provider]  ELIQUIS 5 MG TABS tablet TAKE 1 TABLET BY MOUTH TWICE A DAY Patient taking differently: Take 5 mg by mouth 2 (two) times daily. 07/07/23   Patwardhan, Anabel Bene, MD  hydrALAZINE (APRESOLINE) 10 MG tablet Take 10 mg by mouth 3 (three) times daily. 10/03/23   [provider]  isosorbide mononitrate (IMDUR) 30 MG 24 hr tablet Take 1 tablet (30 mg total) by mouth daily. 09/24/23   Patwardhan, Anabel Bene, MD  levothyroxine (SYNTHROID, LEVOTHROID) 125 MCG tablet Take 125 mcg by mouth daily before breakfast. 06/19/12   [provider]  metoprolol succinate (TOPROL-XL) 100 MG 24 hr tablet Take 1 tablet (100 mg total) by mouth daily. Take with or immediately following a meal. 09/10/23   Weaver, Lorin Picket T, PA-C  omeprazole (PRILOSEC) 20 MG capsule TAKE 1 CAPSULE BY MOUTH EVERY DAY Patient taking differently: Take 20 mg by mouth daily before breakfast. 12/21/21   Hilarie Fredrickson, MD   Oxymetazoline HCl (SINEX ULTRA FINE MIST 12-HOUR NA) Place 1 spray into both nostrils every 12 (twelve) hours as needed (for congestion).    [provider]  pravastatin (PRAVACHOL) 80 MG tablet Take 80 mg by mouth in the morning. 07/16/12   [provider]  traZODone (DESYREL) 50 MG tablet Take 50 mg by mouth daily. 10/03/23   [provider]  VISINE 0.025-0.3 % ophthalmic solution Place 1 drop into both eyes 2 (two) times daily as needed for eye irritation.    [provider]  vitamin B-12 (CYANOCOBALAMIN) 1000 MCG tablet Take 1,000 mcg by mouth daily. 01/16/21   [provider]    ___________________________________________________________________________________________________ Physical Exam:    10/08/2023    7:15 PM 10/08/2023    5:30 PM 10/08/2023    1:57 PM  Vitals with BMI  Systolic 163 137 161  Diastolic 111 88 82  Pulse 120 95 106     1. General:  in No  Acute distress   Chronically ill  -appearing 2. Psychological: Alert and   Oriented 3. Head/ENT:   Dry Mucous Membranes                          Head Non traumatic, neck supple                           Poor Dentition 4. SKIN: normal   Skin turgor,  Skin clean Dry and intact no rash    5. Heart: Regular rate and rhythm no*** Murmur, no Rub or gallop 6. Lungs:  no wheezes or crackles   7. Abdomen: Soft, ***non-tender, Non distended   obese ***bowel sounds present 8. Lower extremities: no clubbing, cyanosis, no ***edema 9. Neurologically Grossly intact, moving all 4 extremities equally *** strength 5 out of 5 in all 4 extremities cranial nerves II through XII intact 10. MSK: Normal range of motion    Chart has been reviewed  ______________________________________________________________________________________________  Assessment/Plan  77 y.o. female with medical history significant of systolic CHF, severe pulmonary hypertension with mPAP 47 mmHg, hyponatremia,  A. Fib on  Eliquis, CKD 3b, goiter, HLD, HTN, hypothyrodism, obesity  Admitted for   Acute on chronic congestive heart failure,  Delirium  Hyperammonemia (HCC)      Present on Admission: **None**     No problem-specific Assessment & Plan notes found for this encounter.    Other plan as per orders.  DVT prophylaxis:   Eliquis    Code Status:    Code Status: Prior FULL CODE  as per patient   I had personally discussed CODE STATUS with patient and family  ACP   none    Family Communication:   Family  at  Bedside  plan of care was discussed   with   Daughter   Diet  Diet Orders (From admission, onward)     Start     Ordered   10/08/23 1410  Diet NPO time specified  Diet effective now        10/08/23 1410            Disposition Plan:   *** likely will need placement for rehabilitation                          Back to current facility when stable                            To home once workup is complete and patient is stable  ***Following barriers for discharge:                             Chest pain *** Stroke *** work up is complete                            Electrolytes corrected                               Anemia corrected h/H stable                             Pain controlled with PO medications                               Afebrile, white count improving able to transition to PO antibiotics                             Will need to be able to tolerate PO                            Will likely need home health, home O2, set up                           Will need consultants to evaluate patient prior to discharge       Consult Orders  (From admission, onward)           Start     Ordered   10/08/23 1805  Consult to hospitalist  Paged by Velna Hatchet  Once       Provider:  (Not yet assigned)  Question Answer Comment  Place call to:  [facility]   Reason for Consult Admit      10/08/23 1804                              ***Would benefit from PT/OT eval  prior to DC  Ordered                   Swallow eval - SLP ordered                   Diabetes care coordinator                   Transition of care consulted                   Nutrition    consulted                  Wound care  consulted                   Palliative care    consulted                   Behavioral health  consulted                    Consults called: ***     Admission status:  ED Disposition     ED Disposition  Admit   Condition  --   Comment  The patient appears reasonably stabilized for admission considering the current resources, flow, and capabilities available in the ED at this time, and I doubt any other [facility] requiring further screening and/or treatment in the ED prior to admission is  present.           Obs***  ***  inpatient     I Expect 2 midnight stay secondary to severity of patient's current illness need for inpatient interventions justified by the following: ***hemodynamic instability despite optimal treatment (tachycardia *hypotension * tachypnea *hypoxia, hypercapnia) * Severe lab/radiological/exam abnormalities including:     and extensive comorbidities including: *substance abuse  *Chronic pain *DM2  * CHF * CAD  * COPD/asthma *Morbid Obesity * CKD *dementia *liver disease *history of stroke with residual deficits *  malignancy, * sickle cell disease  History of amputation Chronic anticoagulation  That are currently affecting medical management.   I expect  patient to be hospitalized for 2 midnights requiring inpatient medical care.  Patient is at high risk for adverse outcome (such as loss of life or disability) if not treated.  Indication for inpatient stay as follows:  Severe change from baseline regarding mental status Hemodynamic instability despite maximal medical therapy,  ongoing suicidal ideations,  severe pain requiring acute inpatient management,  inability to maintain oral hydration   persistent chest pain  despite medical management Need for operative/procedural  intervention New or worsening hypoxia   Need for IV antibiotics, IV fluids, IV rate controling medications, IV antihypertensives, IV pain medications, IV anticoagulation, need for biPAP    Level of care   *** tele  For 12H 24H     medical floor       progressive     stepdown   tele indefinitely please discontinue once patient no longer qualifies COVID-19 Labs    Lab Results  Component Value Date   SARSCOV2NAA NEGATIVE 06/19/2021     Precautions: admitted as *** Covid Negative  ***asymptomatic screening protocol****PUI *** covid positive No  active isolations ***If Covid PCR is negative  - please DC precautions - would need additional investigation given very high risk for false native test result    Critical***  Patient is critically ill due to  hemodynamic instability * respiratory failure *severe sepsis* ongoing chest pain*  They are at high risk for life/limb threatening clinical deterioration requiring frequent reassessment and modifications of care.  Services provided include examination of the patient, review of relevant ancillary tests, prescription of lifesaving therapies, review of medications and prophylactic therapy.  Total critical care time excluding separately billable procedures: 60*  Minutes.    Bodin Gorka 10/08/2023, 7:59 PM ***  [facility]     after 2 AM please page floor coverage PA If 7AM-7PM, please contact the day team taking care of the patient using [email or web address]

## 2023-10-09 ENCOUNTER — Observation Stay (HOSPITAL_COMMUNITY): Payer: Medicare HMO

## 2023-10-09 ENCOUNTER — Ambulatory Visit: Payer: Medicare HMO | Admitting: Physician Assistant

## 2023-10-09 ENCOUNTER — Encounter (HOSPITAL_COMMUNITY): Payer: Self-pay | Admitting: Internal Medicine

## 2023-10-09 ENCOUNTER — Other Ambulatory Visit: Payer: Self-pay

## 2023-10-09 DIAGNOSIS — Z96652 Presence of left artificial knee joint: Secondary | ICD-10-CM | POA: Diagnosis not present

## 2023-10-09 DIAGNOSIS — I4819 Other persistent atrial fibrillation: Secondary | ICD-10-CM | POA: Diagnosis not present

## 2023-10-09 DIAGNOSIS — I509 Heart failure, unspecified: Secondary | ICD-10-CM | POA: Diagnosis not present

## 2023-10-09 DIAGNOSIS — I5043 Acute on chronic combined systolic (congestive) and diastolic (congestive) heart failure: Secondary | ICD-10-CM | POA: Diagnosis not present

## 2023-10-09 DIAGNOSIS — G9341 Metabolic encephalopathy: Secondary | ICD-10-CM | POA: Diagnosis not present

## 2023-10-09 DIAGNOSIS — I2510 Atherosclerotic heart disease of native coronary artery without angina pectoris: Secondary | ICD-10-CM | POA: Diagnosis not present

## 2023-10-09 DIAGNOSIS — I4821 Permanent atrial fibrillation: Secondary | ICD-10-CM

## 2023-10-09 DIAGNOSIS — I21A1 Myocardial infarction type 2: Secondary | ICD-10-CM | POA: Diagnosis not present

## 2023-10-09 DIAGNOSIS — I2722 Pulmonary hypertension due to left heart disease: Secondary | ICD-10-CM | POA: Diagnosis not present

## 2023-10-09 DIAGNOSIS — I428 Other cardiomyopathies: Secondary | ICD-10-CM | POA: Diagnosis not present

## 2023-10-09 DIAGNOSIS — I10 Essential (primary) hypertension: Secondary | ICD-10-CM | POA: Diagnosis not present

## 2023-10-09 DIAGNOSIS — N189 Chronic kidney disease, unspecified: Secondary | ICD-10-CM

## 2023-10-09 DIAGNOSIS — R7989 Other specified abnormal findings of blood chemistry: Secondary | ICD-10-CM

## 2023-10-09 DIAGNOSIS — K8020 Calculus of gallbladder without cholecystitis without obstruction: Secondary | ICD-10-CM | POA: Diagnosis not present

## 2023-10-09 DIAGNOSIS — R443 Hallucinations, unspecified: Secondary | ICD-10-CM | POA: Diagnosis not present

## 2023-10-09 DIAGNOSIS — I272 Pulmonary hypertension, unspecified: Secondary | ICD-10-CM

## 2023-10-09 DIAGNOSIS — Z8249 Family history of ischemic heart disease and other diseases of the circulatory system: Secondary | ICD-10-CM | POA: Diagnosis not present

## 2023-10-09 DIAGNOSIS — F0392 Unspecified dementia, unspecified severity, with psychotic disturbance: Secondary | ICD-10-CM | POA: Diagnosis not present

## 2023-10-09 DIAGNOSIS — I130 Hypertensive heart and chronic kidney disease with heart failure and stage 1 through stage 4 chronic kidney disease, or unspecified chronic kidney disease: Secondary | ICD-10-CM | POA: Diagnosis not present

## 2023-10-09 DIAGNOSIS — I350 Nonrheumatic aortic (valve) stenosis: Secondary | ICD-10-CM | POA: Diagnosis not present

## 2023-10-09 DIAGNOSIS — N1832 Chronic kidney disease, stage 3b: Secondary | ICD-10-CM | POA: Diagnosis not present

## 2023-10-09 DIAGNOSIS — I2489 Other forms of acute ischemic heart disease: Secondary | ICD-10-CM | POA: Diagnosis not present

## 2023-10-09 DIAGNOSIS — E039 Hypothyroidism, unspecified: Secondary | ICD-10-CM | POA: Diagnosis not present

## 2023-10-09 DIAGNOSIS — K761 Chronic passive congestion of liver: Secondary | ICD-10-CM | POA: Diagnosis not present

## 2023-10-09 DIAGNOSIS — K7682 Hepatic encephalopathy: Secondary | ICD-10-CM | POA: Diagnosis not present

## 2023-10-09 DIAGNOSIS — R410 Disorientation, unspecified: Secondary | ICD-10-CM | POA: Diagnosis not present

## 2023-10-09 DIAGNOSIS — J90 Pleural effusion, not elsewhere classified: Secondary | ICD-10-CM | POA: Diagnosis not present

## 2023-10-09 DIAGNOSIS — E7220 Disorder of urea cycle metabolism, unspecified: Secondary | ICD-10-CM | POA: Diagnosis not present

## 2023-10-09 DIAGNOSIS — I5023 Acute on chronic systolic (congestive) heart failure: Secondary | ICD-10-CM | POA: Diagnosis not present

## 2023-10-09 DIAGNOSIS — E785 Hyperlipidemia, unspecified: Secondary | ICD-10-CM | POA: Diagnosis not present

## 2023-10-09 DIAGNOSIS — Z79899 Other long term (current) drug therapy: Secondary | ICD-10-CM | POA: Diagnosis not present

## 2023-10-09 DIAGNOSIS — Z6834 Body mass index (BMI) 34.0-34.9, adult: Secondary | ICD-10-CM | POA: Diagnosis not present

## 2023-10-09 DIAGNOSIS — Z79890 Hormone replacement therapy (postmenopausal): Secondary | ICD-10-CM | POA: Diagnosis not present

## 2023-10-09 DIAGNOSIS — I5022 Chronic systolic (congestive) heart failure: Secondary | ICD-10-CM

## 2023-10-09 DIAGNOSIS — E871 Hypo-osmolality and hyponatremia: Secondary | ICD-10-CM | POA: Diagnosis not present

## 2023-10-09 DIAGNOSIS — I5020 Unspecified systolic (congestive) heart failure: Secondary | ICD-10-CM | POA: Diagnosis not present

## 2023-10-09 DIAGNOSIS — E440 Moderate protein-calorie malnutrition: Secondary | ICD-10-CM | POA: Diagnosis not present

## 2023-10-09 LAB — CBC
HCT: 46.1 % — ABNORMAL HIGH (ref 36.0–46.0)
Hemoglobin: 14.5 g/dL (ref 12.0–15.0)
MCH: 26.8 pg (ref 26.0–34.0)
MCHC: 31.5 g/dL (ref 30.0–36.0)
MCV: 85.2 fL (ref 80.0–100.0)
Platelets: 168 10*3/uL (ref 150–400)
RBC: 5.41 MIL/uL — ABNORMAL HIGH (ref 3.87–5.11)
RDW: 15.1 % (ref 11.5–15.5)
WBC: 8.2 10*3/uL (ref 4.0–10.5)
nRBC: 0 % (ref 0.0–0.2)

## 2023-10-09 LAB — VITAMIN B12: Vitamin B-12: 3128 pg/mL — ABNORMAL HIGH (ref 180–914)

## 2023-10-09 LAB — COMPREHENSIVE METABOLIC PANEL
ALT: 16 U/L (ref 0–44)
AST: 28 U/L (ref 15–41)
Albumin: 3.3 g/dL — ABNORMAL LOW (ref 3.5–5.0)
Alkaline Phosphatase: 39 U/L (ref 38–126)
Anion gap: 10 (ref 5–15)
BUN: 23 mg/dL (ref 8–23)
CO2: 30 mmol/L (ref 22–32)
Calcium: 9.1 mg/dL (ref 8.9–10.3)
Chloride: 100 mmol/L (ref 98–111)
Creatinine, Ser: 1.55 mg/dL — ABNORMAL HIGH (ref 0.44–1.00)
GFR, Estimated: 34 mL/min — ABNORMAL LOW (ref >60)
Glucose, Bld: 112 mg/dL — ABNORMAL HIGH (ref 70–99)
Potassium: 4.1 mmol/L (ref 3.5–5.1)
Sodium: 140 mmol/L (ref 135–145)
Total Bilirubin: 1.1 mg/dL (ref <1.2)
Total Protein: 6.1 g/dL — ABNORMAL LOW (ref 6.5–8.1)

## 2023-10-09 LAB — PHOSPHORUS: Phosphorus: 3.8 mg/dL (ref 2.5–4.6)

## 2023-10-09 LAB — AMMONIA: Ammonia: 12 umol/L (ref 9–35)

## 2023-10-09 LAB — TROPONIN I (HIGH SENSITIVITY): Troponin I (High Sensitivity): 26 ng/L — ABNORMAL HIGH (ref <18)

## 2023-10-09 LAB — FOLATE: Folate: 16.8 ng/mL (ref >5.9)

## 2023-10-09 LAB — MAGNESIUM: Magnesium: 1.9 mg/dL (ref 1.7–2.4)

## 2023-10-09 MED ORDER — FUROSEMIDE 10 MG/ML IJ SOLN
60.0000 mg | Freq: Two times a day (BID) | INTRAMUSCULAR | Status: DC
  Administered 2023-10-09 – 2023-10-12 (×8): 60 mg via INTRAVENOUS
  Filled 2023-10-09 (×8): qty 6

## 2023-10-09 MED ORDER — FUROSEMIDE 10 MG/ML IJ SOLN
40.0000 mg | Freq: Every day | INTRAMUSCULAR | Status: DC
  Filled 2023-10-09: qty 4

## 2023-10-09 MED ORDER — FUROSEMIDE 10 MG/ML IJ SOLN: 60.0000 mg | Freq: Two times a day (BID) | INTRAMUSCULAR | Status: DC

## 2023-10-09 MED ORDER — DIGOXIN 125 MCG PO TABS
0.1250 mg | ORAL_TABLET | Freq: Every day | ORAL | Status: DC
  Administered 2023-10-09 – 2023-10-14 (×6): 0.125 mg via ORAL
  Filled 2023-10-09 (×6): qty 1

## 2023-10-09 MED ORDER — LACTULOSE 10 GM/15ML PO SOLN
30.0000 g | Freq: Two times a day (BID) | ORAL | Status: DC
  Administered 2023-10-09 (×2): 30 g via ORAL
  Filled 2023-10-09 (×3): qty 45

## 2023-10-09 NOTE — Assessment & Plan Note (Signed)
Appears to be fluid up. Started on diuresis with good improvement.  Continue to diurese with Lasix 40 mg twice daily and follow renal status patient has had recent echogram

## 2023-10-09 NOTE — Progress Notes (Addendum)
PROGRESS NOTE    [name]  WUJ:[id number] DOB: July 16, 1946 DOA: 10/08/2023 PCP: Creola Corn, MD  Chronically ill morbidly obese female with history of chronic systolic CHF, moderately reduced RV, persistent A-fib, history of pericardial effusion status post pericardiocentesis 2/22, CKD 3 A, hypertension, obesity, hypothyroidism presented to the ED with confusion and edema. -Reportedly was having hallucinations and confusion, Lasix was changed to Bumex. -PCP started her on trazodone after this suspecting insomnia induced delirium -Had worsening agitation and sundowning with cough for last few weeks, along with progressive dyspnea on exertion, in the ED mildly tachycardic at the [facility], labs with sodium 140, creatinine 1.6, BNP 1027, WBC 7.4, troponin 34, ammonia level 80 -Admitted, started on diuretics and lactulose  Subjective: Feels a little better overall  Assessment and Plan:  Acute on chronic systolic CHF -Last echo 9/28 with a EF down to 30-35% with moderately reduced RV -Cardiology consulting -Continue IV Lasix 40 Mg twice daily, metoprolol -Poor candidate for SGLT2i with encephalopathy delirium etc.  Metabolic encephalopathy, hallucination -Likely multifactorial, combination of hepatic encephalopathy, delirium -elevated ammonia likely from RV failure, hepatic congestion -CT, MRI brain were unremarkable, UA benign -Could have untreated sleep apnea as well -Continue lactulose today, monitor  Persistent atrial fibrillation Continue Toprol and Eliquis  CKD 3B -Stable, monitor with diuresis  Hypothyroidism Continue Synthroid, TSH is normal   DVT prophylaxis: Apixaban Code Status: Full code Family Communication: Daughter and son Disposition Plan: To be determined  Consultants:    Procedures:   Antimicrobials:    Objective: Vitals:   10/09/23 0539 10/09/23 0600 10/09/23 0615 10/09/23 0630  BP:  133/88 (!) 132/95 (!) 147/92  Pulse: 99 98 (!) 50 (!) 104  Resp:  19 (!) 23 17 18   Temp:      TempSrc:      SpO2: 98% 94% 95% 94%   No intake or output data in the 24 hours ending 10/09/23 1017 There were no vitals filed for this visit.  Examination:  General exam: Obese chronically ill elderly female sitting up, AAOx3 HEENT: Neck obese unable to assess JVD CVS: S1-S2, regular rhythm Lungs: Decreased breath sounds at the bases Abdomen: Soft, nontender, bowel sounds present Extremities: 1+ edema Skin: No rashes Psychiatry:  Mood & affect appropriate.     Data Reviewed:   CBC: Recent Labs  Lab 10/08/23 1415 10/09/23 0443  WBC 7.4 8.2  HGB 13.8 14.5  HCT 44.5 46.1*  MCV 84.9 85.2  PLT 274 168   Basic Metabolic Panel: Recent Labs  Lab 10/08/23 1415 10/08/23 1533 10/08/23 2142 10/09/23 0555  NA 140  --   --  140  K 3.6  --   --  4.1  CL 102  --   --  100  CO2 29  --   --  30  GLUCOSE 109*  --   --  112*  BUN 24*  --   --  23  CREATININE 1.62*  --   --  1.55*  CALCIUM 9.2  --   --  9.1  MG  --  2.0  --  1.9  PHOS  --   --  3.5 3.8   GFR: CrCl cannot be calculated (Unknown ideal weight.). Liver Function Tests: Recent Labs  Lab 10/08/23 1415 10/09/23 0555  AST 20 28  ALT 15 16  ALKPHOS 42 39  BILITOT 0.7 1.1  PROT 6.3* 6.1*  ALBUMIN 3.4* 3.3*   No results for input(s): "LIPASE", "AMYLASE" in the last 168 hours.  Recent Labs  Lab 10/08/23 1631  AMMONIA 80*   Coagulation Profile: No results for input(s): "INR", "PROTIME" in the last 168 hours. Cardiac Enzymes: Recent Labs  Lab 10/08/23 2142  CKTOTAL 44   BNP (last 3 results) Recent Labs    09/24/23 1557  PROBNP 17,553*   HbA1C: No results for input(s): "HGBA1C" in the last 72 hours. CBG: Recent Labs  Lab 10/08/23 1556  GLUCAP 102*   Lipid Profile: No results for input(s): "CHOL", "HDL", "LDLCALC", "TRIG", "CHOLHDL", "LDLDIRECT" in the last 72 hours. Thyroid Function Tests: Recent Labs    10/08/23 1533  TSH 0.933   Anemia Panel: Recent Labs     10/09/23 0555  VITAMINB12 3,128*  FOLATE 16.8   Urine analysis:    Component Value Date/Time   COLORURINE STRAW (A) 10/08/2023 1454   APPEARANCEUR CLEAR 10/08/2023 1454   LABSPEC 1.008 10/08/2023 1454   PHURINE 6.0 10/08/2023 1454   GLUCOSEU NEGATIVE 10/08/2023 1454   HGBUR NEGATIVE 10/08/2023 1454   BILIRUBINUR NEGATIVE 10/08/2023 1454   KETONESUR NEGATIVE 10/08/2023 1454   PROTEINUR NEGATIVE 10/08/2023 1454   UROBILINOGEN 0.2 01/31/2011 1208   NITRITE NEGATIVE 10/08/2023 1454   LEUKOCYTESUR NEGATIVE 10/08/2023 1454   Sepsis Labs: @LABRCNTIP (procalcitonin:4,lacticidven:4)  )No results found for this or any previous visit (from the past 240 hour(s)).   Radiology Studies: US Abdomen Limited RUQ (LIVER/GB)  Result Date: 10/09/2023 CLINICAL DATA:  Increased ammonia level EXAM: ULTRASOUND ABDOMEN LIMITED RIGHT UPPER QUADRANT COMPARISON:  None Available. FINDINGS: Gallbladder: Multiple stones within the gallbladder measuring up 21.1 cm. No wall thickening or sonographic Murphy sign. Common bile duct: Diameter: Normal caliber, 3 mm Liver: No focal lesion identified. Within normal limits in parenchymal echogenicity. Portal vein is patent on color Doppler imaging with normal direction of blood flow towards the liver. Other: Right pleural effusion noted IMPRESSION: Cholelithiasis.  No sonographic evidence of acute cholecystitis. Right pleural effusion. Electronically Signed   By: Charlett Nose M.D.   On: 10/09/2023 00:33   MR BRAIN WO CONTRAST  Result Date: 10/08/2023 CLINICAL DATA:  Delirium EXAM: MRI HEAD WITHOUT CONTRAST TECHNIQUE: Multiplanar, multiecho pulse sequences of the brain and surrounding structures were obtained without intravenous contrast. COMPARISON:  None Available. FINDINGS: Brain: No acute infarct, mass effect or extra-axial collection. No chronic microhemorrhage or siderosis. There is multifocal hyperintense T2-weighted signal within the white matter. Generalized  volume loss. The midline structures are normal. Vascular: Normal flow voids Skull and upper cervical spine: Normal marrow signal Sinuses/Orbits: Right mastoid effusion. Paranasal sinuses are clear. Normal orbits. Other: None IMPRESSION: 1. No acute intracranial abnormality. 2. Chronic small vessel ischemia and volume loss. Electronically Signed   By: Deatra Robinson M.D.   On: 10/08/2023 23:50   CT HEAD WO CONTRAST  Result Date: 10/08/2023 CLINICAL DATA:  Altered level of consciousness, confusion, hallucinations EXAM: CT HEAD WITHOUT CONTRAST TECHNIQUE: Contiguous axial images were obtained from the base of the skull through the vertex without intravenous contrast. RADIATION DOSE REDUCTION: This exam was performed according to the departmental dose-optimization program which includes automated exposure control, adjustment of the mA and/or kV according to patient size and/or use of iterative reconstruction technique. COMPARISON:  09/27/2023 FINDINGS: Brain: No evidence of acute infarct or hemorrhage. Lateral ventricles and midline structures are stable. No acute extra-axial fluid collections. No mass effect. Vascular: No hyperdense vessel or unexpected calcification. Skull: Normal. Negative for fracture or focal lesion. Sinuses/Orbits: No acute finding.  Stable right mastoid effusion. Other: None. IMPRESSION: 1. Stable head  CT, no acute intracranial process. Electronically Signed   By: Sharlet Salina M.D.   On: 10/08/2023 18:20   DG Chest 2 View  Result Date: 10/08/2023 CLINICAL DATA:  Shortness of breath. EXAM: CHEST - 2 VIEW COMPARISON:  September 26, 2023. FINDINGS: Stable cardiomediastinal silhouette. Stable reticular densities are noted throughout both lungs which may represent scarring, but acute superimposed edema or atypical inflammation cannot be excluded. Bony thorax is unremarkable. IMPRESSION: Stable reticular densities are noted throughout both lungs which may represent scarring, but acute  superimposed edema or atypical inflammation cannot be excluded. Aortic Atherosclerosis (ICD10-I70.0). Electronically Signed   By: Lupita Raider M.D.   On: 10/08/2023 16:55     Scheduled Meds:  apixaban  5 mg Oral BID   furosemide  40 mg Intravenous Daily   guaiFENesin  600 mg Oral BID   isosorbide mononitrate  30 mg Oral Daily   levothyroxine  125 mcg Oral QAC breakfast   metoprolol succinate  100 mg Oral Daily   pravastatin  80 mg Oral Daily   sodium chloride flush  3 mL Intravenous Q12H   traZODone  100 mg Oral QHS   Continuous Infusions:  sodium chloride       LOS: 0 days    Time spent:    Zannie Cove, MD [facility]   10/09/2023, 10:17 AM

## 2023-10-09 NOTE — Consult Note (Addendum)
Cardiology Consultation   Patient ID: [name] MRN: [id number]; DOB: 03-01-1946  Admit date: 10/08/2023 Date of Consult: 10/09/2023  PCP:  Creola Corn, MD    [facility] Providers Cardiologist:  Elder Negus, MD   {  Patient Profile:   [name] is a 77 y.o. female with a hx of chronic HFrEF, nonischemic cardiomyopathy, severe pulmonary hypertension group 2, permanent atrial fibrillation, aortic stenosis, hyponatremia, CKD, hypertension, hyperlipidemia, obesity, dementia who is being seen 10/09/2023 for the evaluation of CHF exacerbation at the request of Dr. Jomarie Longs.  History of Present Illness:   [name] has a recent admission in October for acute on chronic heart failure.  Previously had normal EF however was noted to have EF 30 to 35% with moderately reduced RV function. During that admission she underwent right and left heart catheterization that demonstrated mild to moderate diffuse disease in the left circumflex and the LAD but not hemodynamically significant.  She had moderate to severe pulmonary hypertension with pulmonary venous hypertension. PAP-mean 69/34-47 mmHg with PCWP of 33- 35 mmHg.  She had mildly reduced cardiac output.  She was given IV diuretics but titration of GDMT difficult due to underlying CKD.  She also has had significant hyponatremia thought related to excess water intake, delusional from CHF, and possibly spironolactone so this was discontinued.  This has now normalized with free water restriction.  Additionally, outpatient she has been having significant hallucinations thought possibly related to her Lasix so she was transition to Bumex however as stated she had decreased urinary output for this.  She has been communicating with her primary cardiologist and PCP trying to address this outpatient however have now recommended ED evaluation  She is currently being evaluated for CHF exacerbation and hallucinations.  She sundown's frequently  and is stating that she sees people and that they make her scared and nervous.  However she denied any communication with these hallucinated persons and they do not tell her to do anything.  Sometimes sees animals.  She has been reporting decreased efficacy of her diuretics since transitioning to Bumex once daily, however has not been taking this for very long.  She is reporting worsening peripheral edema and exertional shortness of breath but denies any orthopnea, PND.  AMS may be due to a blood gas that showed low P O2 less than 31, elevated bicarb 38.5, acid base excess 1.8 and elevated ammonia level of 80 that has now normalized.  Her husband does her medications for her and reports 100% compliancy.  CT and MRI of the brain did not show any significant findings.  Chest x-ray showed stable reticular densities that may represent superimposed edema/inflammation.  BNP 1000+.  Sodium has normalized this month.  140 blood previously as well as 120.  Creatinine 1.55.  Ammonia levels were normal.  Normal CBC.   Past Medical History:  Diagnosis Date   Arthritis    CHF (congestive heart failure) (HCC)    Chronic HFrEF (heart failure with reduced ejection fraction) (HCC)    CKD (chronic kidney disease), stage III (HCC)    Colon polyps    Goiter    Hyperglycemia    Hyperlipidemia    Hypertension    Hypothyroidism    Morbid obesity (HCC)    Osteopenia    Permanent atrial fibrillation (HCC)     Past Surgical History:  Procedure Laterality Date   CARDIOVASCULAR STRESS TEST     COLONOSCOPY     IR THORACENTESIS ASP PLEURAL  SPACE W/IMG GUIDE  01/26/2021   LUMBAR DISC SURGERY  2012   PERICARDIOCENTESIS N/A 01/24/2021   Procedure: PERICARDIOCENTESIS;  Surgeon: Yates Decamp, MD;  Location: [facility] INVASIVE CV LAB;  Service: Cardiovascular;  Laterality: N/A;   POLYPECTOMY     RIGHT/LEFT HEART CATH AND CORONARY ANGIOGRAPHY N/A 09/01/2023   Procedure: RIGHT/LEFT HEART CATH AND CORONARY ANGIOGRAPHY;  Surgeon:  Marykay Lex, MD;  Location: [facility] INVASIVE CV LAB;  Service: Cardiovascular;  Laterality: N/A;   TOTAL KNEE ARTHROPLASTY Left 05/29/2017   Procedure: LEFT TOTAL KNEE ARTHROPLASTY;  Surgeon: Tarry Kos, MD;  Location: [facility] OR;  Service: Orthopedics;  Laterality: Left;   TUBAL LIGATION  1977   UPPER GASTROINTESTINAL ENDOSCOPY  12/29/2019   US ECHOCARDIOGRAPHY        Inpatient Medications: Scheduled Meds:  apixaban  5 mg Oral BID   furosemide  60 mg Intravenous BID   guaiFENesin  600 mg Oral BID   isosorbide mononitrate  30 mg Oral Daily   lactulose  30 g Oral BID   levothyroxine  125 mcg Oral QAC breakfast   metoprolol succinate  100 mg Oral Daily   pravastatin  80 mg Oral Daily   sodium chloride flush  3 mL Intravenous Q12H   traZODone  100 mg Oral QHS   Continuous Infusions:  sodium chloride     PRN Meds: sodium chloride, acetaminophen **OR** acetaminophen, albuterol, sodium chloride flush  Allergies:    Allergies  Allergen Reactions   Spironolactone Other (See Comments)    Hyponatremia-  the level of sodium in blood is too high   Atorvastatin Other (See Comments)    "Aches"     Social History:   Social History   Socioeconomic History   Marital status: Married    Spouse name: Not on file   Number of children: 2   Years of education: Not on file   Highest education level: Not on file  Occupational History   Occupation: retired  Tobacco Use   Smoking status: Never   Smokeless tobacco: Never  Vaping Use   Vaping status: Never Used  Substance and Sexual Activity   Alcohol use: No   Drug use: No   Sexual activity: Not on file  Other Topics Concern   Not on file  Social History Narrative   Not on file   Social Determinants of Health   Financial Resource Strain: Not on file  Food Insecurity: No Food Insecurity (10/08/2023)   Hunger Vital Sign    Worried About Running Out of Food in the Last Year: Never true    Ran Out of Food in the Last Year: Never true   Transportation Needs: No Transportation Needs (10/08/2023)   PRAPARE - Administrator, Civil Service (Medical): No    Lack of Transportation (Non-Medical): No  Physical Activity: Not on file  Stress: Not on file  Social Connections: Not on file  Intimate Partner Violence: Not At Risk (10/08/2023)   Humiliation, Afraid, Rape, and Kick questionnaire    Fear of Current or Ex-Partner: No    Emotionally Abused: No    Physically Abused: No    Sexually Abused: No    Family History:   Family History  Problem Relation Age of Onset   CVA Mother    Heart disease Brother    Heart disease Father    Colon cancer Neg Hx    Stomach cancer Neg Hx    Colon polyps Neg Hx  Esophageal cancer Neg Hx    Rectal cancer Neg Hx      ROS:  Please see the history of present illness.  All other ROS reviewed and negative.     Physical Exam/Data:   Vitals:   10/09/23 0915 10/09/23 0930 10/09/23 1000 10/09/23 1015  BP: 127/81 (!) 141/104 (!) 135/110 (!) 145/103  Pulse: 75 88 (!) 117 (!) 105  Resp: 17 17 (!) 28 (!) 30  Temp:      TempSrc:      SpO2: 93% 92% 92% 90%   No intake or output data in the 24 hours ending 10/09/23 1119    09/24/2023    3:39 PM 09/10/2023    2:32 PM 09/02/2023    5:00 AM  Last 3 Weights  Weight (lbs) 211 lb 3.2 oz 214 lb 12.8 oz 209 lb 6.4 oz  Weight (kg) 95.8 kg 97.433 kg 94.983 kg     There is no height or weight on file to calculate BMI.  General:  Well nourished, well developed, in no acute distress HEENT: normal Neck: unable to properly assess JVD, she's on bed commode  Vascular: No carotid bruits; Distal pulses 2+ bilaterally Cardiac: Irregularly irregular Lungs: Crackles in lower base Abd: soft, nontender, no hepatomegaly  Ext: 1-2+ edema, difficult to assess, patient reporting pain with palpation Musculoskeletal:  No deformities, BUE and BLE strength normal and equal Skin: warm and dry  Neuro:  CNs 2-12 intact, no focal abnormalities  noted Psych:  Normal affect   EKG:  The EKG was personally reviewed and demonstrates: Atrial fibrillation, heart rate 115.  Left bundle branch block. Telemetry:  Telemetry was personally reviewed and demonstrates: Atrial fibrillation. 90-130  Relevant CV Studies: Right and left heart catheterization 09/01/2023 POST-CATH FINDINGS Angiographically mild to moderate diffuse disease in the LCx and LAD but not flow-limiting. Right dominant system. Moderate to Severe Secondary Pulmonary Hypertension With Pulmonary Venous Hypertension:  PAP-mean 69/34-47 mmHg with PCWP of 33- 35 mmHg and LVp-EDP 162/34-26 mmHg.  RAP mean 15 mmHg, RV P-EDP 66/9-15 mmHg Ao sat 94%, PA sat 61%.   Mildly reduced Cardiac Output 4.29, cardiac index 2.09     RECOMMENDATIONS: Patient likely requires additional IV diuresis.  Will return to [facility] for ongoing diuresis . Continue to titrate GDMT for CHF Consider rhythm control for A-fib with bundle branch block.  Echocardiogram 08/30/2023 1. Left ventricular ejection fraction, by estimation, is 30 to 35%. The  left ventricle has moderately decreased function. The left ventricle  demonstrates global hypokinesis. There is mild left ventricular  hypertrophy. Left ventricular diastolic  parameters are indeterminate.   2. Right ventricular systolic function is moderately reduced. The right  ventricular size is normal.   3. Left atrial size was severely dilated.   4. Right atrial size was severely dilated.   5. Moderate pleural effusion.   6. Trivial mitral valve regurgitation.   7. Tricuspid valve regurgitation is mild to moderate.   8. There is moderate calcification of the aortic valve. Aortic valve  regurgitation is mild. Mild aortic valve stenosis.   9. The inferior vena cava is dilated in size with >50% respiratory  variability, suggesting right atrial pressure of 8 mmHg.   Conclusion(s)/Recommendation(s): EF has declined compared to prior study.      Laboratory Data:  High Sensitivity Troponin:   Recent Labs  Lab 10/08/23 1533 10/08/23 1733 10/08/23 2101 10/08/23 2308  TROPONINIHS 34* 33* 23* 26*     Chemistry Recent Labs  Lab 10/08/23 1415 10/08/23 1533 10/09/23 0555  NA 140  --  140  K 3.6  --  4.1  CL 102  --  100  CO2 29  --  30  GLUCOSE 109*  --  112*  BUN 24*  --  23  CREATININE 1.62*  --  1.55*  CALCIUM 9.2  --  9.1  MG  --  2.0 1.9  GFRNONAA 32*  --  34*  ANIONGAP 9  --  10    Recent Labs  Lab 10/08/23 1415 10/09/23 0555  PROT 6.3* 6.1*  ALBUMIN 3.4* 3.3*  AST 20 28  ALT 15 16  ALKPHOS 42 39  BILITOT 0.7 1.1   Lipids No results for input(s): "CHOL", "TRIG", "HDL", "LABVLDL", "LDLCALC", "CHOLHDL" in the last 168 hours.  Hematology Recent Labs  Lab 10/08/23 1415 10/09/23 0443  WBC 7.4 8.2  RBC 5.24* 5.41*  HGB 13.8 14.5  HCT 44.5 46.1*  MCV 84.9 85.2  MCH 26.3 26.8  MCHC 31.0 31.5  RDW 14.9 15.1  PLT 274 168   Thyroid  Recent Labs  Lab 10/08/23 1533  TSH 0.933    BNP Recent Labs  Lab 10/08/23 1533  BNP 1,027.2*    DDimer No results for input(s): "DDIMER" in the last 168 hours.   Radiology/Studies:  US Abdomen Limited RUQ (LIVER/GB)  Result Date: 10/09/2023 CLINICAL DATA:  Increased ammonia level EXAM: ULTRASOUND ABDOMEN LIMITED RIGHT UPPER QUADRANT COMPARISON:  None Available. FINDINGS: Gallbladder: Multiple stones within the gallbladder measuring up 21.1 cm. No wall thickening or sonographic Murphy sign. Common bile duct: Diameter: Normal caliber, 3 mm Liver: No focal lesion identified. Within normal limits in parenchymal echogenicity. Portal vein is patent on color Doppler imaging with normal direction of blood flow towards the liver. Other: Right pleural effusion noted IMPRESSION: Cholelithiasis.  No sonographic evidence of acute cholecystitis. Right pleural effusion. Electronically Signed   By: Charlett Nose M.D.   On: 10/09/2023 00:33   MR BRAIN WO CONTRAST  Result  Date: 10/08/2023 CLINICAL DATA:  Delirium EXAM: MRI HEAD WITHOUT CONTRAST TECHNIQUE: Multiplanar, multiecho pulse sequences of the brain and surrounding structures were obtained without intravenous contrast. COMPARISON:  None Available. FINDINGS: Brain: No acute infarct, mass effect or extra-axial collection. No chronic microhemorrhage or siderosis. There is multifocal hyperintense T2-weighted signal within the white matter. Generalized volume loss. The midline structures are normal. Vascular: Normal flow voids Skull and upper cervical spine: Normal marrow signal Sinuses/Orbits: Right mastoid effusion. Paranasal sinuses are clear. Normal orbits. Other: None IMPRESSION: 1. No acute intracranial abnormality. 2. Chronic small vessel ischemia and volume loss. Electronically Signed   By: Deatra Robinson M.D.   On: 10/08/2023 23:50   CT HEAD WO CONTRAST  Result Date: 10/08/2023 CLINICAL DATA:  Altered level of consciousness, confusion, hallucinations EXAM: CT HEAD WITHOUT CONTRAST TECHNIQUE: Contiguous axial images were obtained from the base of the skull through the vertex without intravenous contrast. RADIATION DOSE REDUCTION: This exam was performed according to the departmental dose-optimization program which includes automated exposure control, adjustment of the mA and/or kV according to patient size and/or use of iterative reconstruction technique. COMPARISON:  09/27/2023 FINDINGS: Brain: No evidence of acute infarct or hemorrhage. Lateral ventricles and midline structures are stable. No acute extra-axial fluid collections. No mass effect. Vascular: No hyperdense vessel or unexpected calcification. Skull: Normal. Negative for fracture or focal lesion. Sinuses/Orbits: No acute finding.  Stable right mastoid effusion. Other: None. IMPRESSION: 1. Stable head CT, no acute intracranial  process. Electronically Signed   By: Sharlet Salina M.D.   On: 10/08/2023 18:20   DG Chest 2 View  Result Date: 10/08/2023 CLINICAL  DATA:  Shortness of breath. EXAM: CHEST - 2 VIEW COMPARISON:  September 26, 2023. FINDINGS: Stable cardiomediastinal silhouette. Stable reticular densities are noted throughout both lungs which may represent scarring, but acute superimposed edema or atypical inflammation cannot be excluded. Bony thorax is unremarkable. IMPRESSION: Stable reticular densities are noted throughout both lungs which may represent scarring, but acute superimposed edema or atypical inflammation cannot be excluded. Aortic Atherosclerosis (ICD10-I70.0). Electronically Signed   By: Lupita Raider M.D.   On: 10/08/2023 16:55     Assessment and Plan:   Acute on chronic HFrEF Pulmonary hypertension WHO 2 Previous hyponatremia Reports primarily worsening peripheral edema, previously admitted in October for CHF exacerbation found to have newly reduced EF 30 to 35%, moderately reduced RV function underwent right and left heart catheterization that demonstrated mild to moderate nonobstructive CAD with mod-severe pulmonary hypertension PAP 69/34-47 mmHg with PCWP of 33- 35 mmHg, LVEDP 162/34-26.  With mildly reduced cardiac output.  She has been having issues with delirium so Lasix was transitioned to Bumex with decreased efficacy Reported to have very good urinary output, no I's and O's yet, on IV Lasix 60 mg.  Will increase this to twice daily.  GDMT limited by hyponatremia and CKD.  Spironolactone previously discontinued due to hyponatremia that has now resolved with free water restriction.  SGLT2 inhibitor may not be a great option given delirium/hallucinations.  Continue Imdur 30 mg.  On hydralazine at home, this may worsen rates though. Currently on Toprol-XL 100 mg, could consider decreasing given previously mildly reduced cardiac output, but appears to be compensating right now and rates are uncontrolled.  Will discuss with MD. Reports normal weight to being around 210-213. Continue with strict I's and O's and daily  weights.  Permanent atrial fibrillation Uncontrolled rates 90-130.  May improve with diuresis, reluctant to increase beta-blocker with acute heart failure exacerbation and low output. Continue Toprol-XL 100 mg, Eliquis 5 mg twice daily  CKD Baseline appears to be around 1.2-1.6.  Creatinine 1.55.  Nonobstructive CAD Mild to moderate noted on catheterization involving left circumflex and LAD.  No aspirin with Eliquis, continue beta-blocker, pravastatin 80 mg  Delirium with hallucinations Multifactorial, on admission had ammonia of 80 that has resolved.  May have some hepatic congestion worsening this.  At baseline also sundown's but currently conversational  Risk Assessment/Risk Scores:   New York Heart Association (NYHA) Functional Class NYHA Class III  CHA2DS2-VASc Score = 5  his indicates a 7.2% annual risk of stroke. The patient's score is based upon: CHF History: 1 HTN History: 1 Diabetes History: 0 Stroke History: 0 Vascular Disease History: 0 Age Score: 2 Gender Score: 1   For questions or updates, please contact  [facility] Please consult [email or web address] for contact info under    Signed, Abagail Kitchens, PA-C  10/09/2023 11:19 AM

## 2023-10-09 NOTE — Assessment & Plan Note (Signed)
Continue Eliquis 5 mg twice daily and Toprol 100 mg daily

## 2023-10-09 NOTE — ED Notes (Signed)
Bladder scan 57 mL urine in bladder

## 2023-10-09 NOTE — ED Notes (Signed)
ED TO INPATIENT HANDOFF REPORT  ED Nurse Name and Phone #: Myrtha Tonkovich [phone]  S Name/Age/Gender Skip Estimable 77 y.o. female Room/Bed: 003C/003C  Code Status   Code Status: Full Code  Home/SNF/Other Home Patient oriented to: self, place, time, and situation Is this baseline? Yes   Triage Complete: Triage complete  Chief Complaint Acute on chronic systolic CHF (congestive heart failure) (HCC) [I50.23]  Triage Note Pt bib family member with reports of confusion and hallucinations. States hallucinations have gotten worse. Pt also endorses bil lower extremity edema. Daughter states she has recently had medication changes.    Allergies Allergies  Allergen Reactions   Spironolactone Other (See Comments)    Hyponatremia-  the level of sodium in blood is too high   Atorvastatin Other (See Comments)    "Aches"     Level of Care/Admitting Diagnosis ED Disposition     ED Disposition  Admit   Condition  --   Comment  Hospital Area:  [facility] [[id number]]  Level of Care: Progressive [102]  Admit to Progressive based on following criteria: CARDIOVASCULAR & THORACIC of moderate stability with acute coronary syndrome symptoms/low risk myocardial infarction/hypertensive urgency/arrhythmias/heart failure potentially compromising stability and stable post cardiovascular intervention patients.  May admit patient to [facility] or [facility] if equivalent level of care is available:: Yes  Covid Evaluation: Asymptomatic - no recent exposure (last 10 days) testing not required  Diagnosis: Acute on chronic systolic CHF (congestive heart failure) [facility]) [[id number]]  Admitting Physician: Therisa Doyne [[id number]]  Attending Physician: Zannie Cove [[id number]]  Certification:: I certify this patient will need inpatient services for at least 2 midnights          B Medical/Surgery History Past Medical History:  Diagnosis Date   Arthritis    CHF (congestive heart failure) (HCC)     Chronic HFrEF (heart failure with reduced ejection fraction) (HCC)    CKD (chronic kidney disease), stage III (HCC)    Colon polyps    Goiter    Hyperglycemia    Hyperlipidemia    Hypertension    Hypothyroidism    Morbid obesity (HCC)    Osteopenia    Permanent atrial fibrillation [facility])    Past Surgical History:  Procedure Laterality Date   CARDIOVASCULAR STRESS TEST     COLONOSCOPY     IR THORACENTESIS ASP PLEURAL SPACE W/IMG GUIDE  01/26/2021   LUMBAR DISC SURGERY  2012   PERICARDIOCENTESIS N/A 01/24/2021   Procedure: PERICARDIOCENTESIS;  Surgeon: Yates Decamp, MD;  Location: [facility] INVASIVE CV LAB;  Service: Cardiovascular;  Laterality: N/A;   POLYPECTOMY     RIGHT/LEFT HEART CATH AND CORONARY ANGIOGRAPHY N/A 09/01/2023   Procedure: RIGHT/LEFT HEART CATH AND CORONARY ANGIOGRAPHY;  Surgeon: Marykay Lex, MD;  Location: [facility] INVASIVE CV LAB;  Service: Cardiovascular;  Laterality: N/A;   TOTAL KNEE ARTHROPLASTY Left 05/29/2017   Procedure: LEFT TOTAL KNEE ARTHROPLASTY;  Surgeon: Tarry Kos, MD;  Location: [facility] OR;  Service: Orthopedics;  Laterality: Left;   TUBAL LIGATION  1977   UPPER GASTROINTESTINAL ENDOSCOPY  12/29/2019   US ECHOCARDIOGRAPHY       A IV Location/Drains/Wounds Patient Lines/Drains/Airways Status     Active Line/Drains/Airways     Name Placement date Placement time Site Days   Peripheral IV 10/09/23 22 G Left Antecubital 10/09/23  0601  Antecubital  less than 1            Intake/Output Last 24 hours No intake or output data in  the 24 hours ending 10/09/23 1333  Labs/Imaging Results for orders placed or performed during the hospital encounter of 10/08/23 (from the past 48 hour(s))  Comprehensive metabolic panel     Status: Abnormal   Collection Time: 10/08/23  2:15 PM  Result Value Ref Range   Sodium 140 135 - 145 mmol/L   Potassium 3.6 3.5 - 5.1 mmol/L   Chloride 102 98 - 111 mmol/L   CO2 29 22 - 32 mmol/L   Glucose, Bld 109 (H) 70 - 99 mg/dL     Comment: Glucose reference range applies only to samples taken after fasting for at least 8 hours.   BUN 24 (H) 8 - 23 mg/dL   Creatinine, Ser 2.72 (H) 0.44 - 1.00 mg/dL   Calcium 9.2 8.9 - 53.6 mg/dL   Total Protein 6.3 (L) 6.5 - 8.1 g/dL   Albumin 3.4 (L) 3.5 - 5.0 g/dL   AST 20 15 - 41 U/L   ALT 15 0 - 44 U/L   Alkaline Phosphatase 42 38 - 126 U/L   Total Bilirubin 0.7 <1.2 mg/dL   GFR, Estimated 32 (L) >60 mL/min    Comment: (NOTE) Calculated using the CKD-EPI Creatinine Equation (2021)    Anion gap 9 5 - 15    Comment: Performed at [facility], [address], [place], [place] [address]  CBC     Status: Abnormal   Collection Time: 10/08/23  2:15 PM  Result Value Ref Range   WBC 7.4 4.0 - 10.5 K/uL   RBC 5.24 (H) 3.87 - 5.11 MIL/uL   Hemoglobin 13.8 12.0 - 15.0 g/dL   HCT 47.4 25.9 - 56.3 %   MCV 84.9 80.0 - 100.0 fL   MCH 26.3 26.0 - 34.0 pg   MCHC 31.0 30.0 - 36.0 g/dL   RDW 87.5 64.3 - 32.9 %   Platelets 274 150 - 400 K/uL   nRBC 0.0 0.0 - 0.2 %    Comment: Performed at [facility], [address], [place], [place] [address]  Urinalysis, w/ Reflex to Culture (Infection Suspected) -Urine, Clean Catch     Status: Abnormal   Collection Time: 10/08/23  2:54 PM  Result Value Ref Range   Specimen Source URINE, CLEAN CATCH    Color, Urine STRAW (A) YELLOW   APPearance CLEAR CLEAR   Specific Gravity, Urine 1.008 1.005 - 1.030   pH 6.0 5.0 - 8.0   Glucose, UA NEGATIVE NEGATIVE mg/dL   Hgb urine dipstick NEGATIVE NEGATIVE   Bilirubin Urine NEGATIVE NEGATIVE   Ketones, ur NEGATIVE NEGATIVE mg/dL   Protein, ur NEGATIVE NEGATIVE mg/dL   Nitrite NEGATIVE NEGATIVE   Leukocytes,Ua NEGATIVE NEGATIVE   RBC / HPF 0-5 0 - 5 RBC/hpf   WBC, UA 0-5 0 - 5 WBC/hpf    Comment:        Reflex urine culture not performed if WBC <=10, OR if Squamous epithelial cells >5. If Squamous epithelial cells >5 suggest recollection.    Bacteria, UA RARE (A) NONE SEEN   Squamous  Epithelial / HPF 0-5 0 - 5 /HPF    Comment: Performed at [facility], [address], [place], [place] [address]  Rapid urine drug screen (hospital performed)     Status: None   Collection Time: 10/08/23  2:54 PM  Result Value Ref Range   Opiates NONE DETECTED NONE DETECTED   Cocaine NONE DETECTED NONE DETECTED   Benzodiazepines NONE DETECTED NONE DETECTED  Amphetamines NONE DETECTED NONE DETECTED   Tetrahydrocannabinol NONE DETECTED NONE DETECTED   Barbiturates NONE DETECTED NONE DETECTED    Comment: (NOTE) DRUG SCREEN FOR MEDICAL PURPOSES ONLY.  IF CONFIRMATION IS NEEDED FOR ANY PURPOSE, NOTIFY LAB WITHIN 5 DAYS.  LOWEST DETECTABLE LIMITS FOR URINE DRUG SCREEN Drug Class                     Cutoff (ng/mL) Amphetamine and metabolites    1000 Barbiturate and metabolites    200 Benzodiazepine                 200 Opiates and metabolites        300 Cocaine and metabolites        300 THC                            50 Performed at [facility], [address] [place], [place] [address]   Troponin I (High Sensitivity)     Status: Abnormal   Collection Time: 10/08/23  3:33 PM  Result Value Ref Range   Troponin I (High Sensitivity) 34 (H) <18 ng/L    Comment: (NOTE) Elevated high sensitivity troponin I (hsTnI) values and significant  changes across serial measurements may suggest ACS but many other  chronic and acute conditions are known to elevate hsTnI results.  Refer to the "Links" section for chest pain algorithms and additional  guidance. Performed at [facility], [address], [place], [place] [address]   Brain natriuretic peptide     Status: Abnormal   Collection Time: 10/08/23  3:33 PM  Result Value Ref Range   B Natriuretic Peptide 1,027.2 (H) 0.0 - 100.0 pg/mL    Comment: Performed at [facility], [address], [place], [place] [address]  TSH     Status: None   Collection Time: 10/08/23  3:33 PM  Result Value Ref Range   TSH 0.933  0.350 - 4.500 uIU/mL    Comment: Performed by a 3rd Generation assay with a functional sensitivity of <=0.01 uIU/mL. Performed at [facility], [address], [place], [place] [address]   Magnesium     Status: None   Collection Time: 10/08/23  3:33 PM  Result Value Ref Range   Magnesium 2.0 1.7 - 2.4 mg/dL    Comment: Performed at [facility], [address], [place], [place] [address]  CBG monitoring, ED     Status: Abnormal   Collection Time: 10/08/23  3:56 PM  Result Value Ref Range   Glucose-Capillary 102 (H) 70 - 99 mg/dL    Comment: Glucose reference range applies only to samples taken after fasting for at least 8 hours.  Ethanol     Status: None   Collection Time: 10/08/23  4:29 PM  Result Value Ref Range   Alcohol, Ethyl (B) <10 <10 mg/dL    Comment: (NOTE) Lowest detectable limit for serum alcohol is 10 mg/dL.  For medical purposes only. Performed at [facility], [address], [place], [place] [address]   Ammonia     Status: Abnormal   Collection Time: 10/08/23  4:31 PM  Result Value Ref Range   Ammonia 80 (H) 9 - 35 umol/L    Comment: Performed at [facility], [address], [place], [place] [address]  Troponin I (High Sensitivity)     Status: Abnormal  Collection Time: 10/08/23  5:33 PM  Result Value Ref Range   Troponin I (High Sensitivity) 33 (H) <18 ng/L    Comment: (NOTE) Elevated high sensitivity troponin I (hsTnI) values and significant  changes across serial measurements may suggest ACS but many other  chronic and acute conditions are known to elevate hsTnI results.  Refer to the "Links" section for chest pain algorithms and additional  guidance. Performed at [facility], [address] [place], [place] [address]   Blood gas, venous     Status: Abnormal   Collection Time: 10/08/23  9:01 PM  Result Value Ref Range   pH, Ven 7.43 7.25 - 7.43   pCO2, Ven 58 44 - 60 mmHg   pO2, Ven <31 (LL) 32 - 45 mmHg    Comment:  CRITICAL RESULT CALLED TO, READ BACK BY AND VERIFIED WITH: R.TERRY RN 2127 10/08/2023 BY G.GANADEN    Bicarbonate 38.5 (H) 20.0 - 28.0 mmol/L   Acid-Base Excess 11.8 (H) 0.0 - 2.0 mmol/L   O2 Saturation 32.8 %   Patient temperature 37.0     Comment: Performed at [facility], [address], [place], [place] [address]  Troponin I (High Sensitivity)     Status: Abnormal   Collection Time: 10/08/23  9:01 PM  Result Value Ref Range   Troponin I (High Sensitivity) 23 (H) <18 ng/L    Comment: (NOTE) Elevated high sensitivity troponin I (hsTnI) values and significant  changes across serial measurements may suggest ACS but many other  chronic and acute conditions are known to elevate hsTnI results.  Refer to the "Links" section for chest pain algorithms and additional  guidance. Performed at [facility], [address] [place], [place] [address]   CK     Status: None   Collection Time: 10/08/23  9:42 PM  Result Value Ref Range   Total CK 44 38 - 234 U/L    Comment: Performed at [facility], [address], [place], [place] [address]  Procalcitonin     Status: None   Collection Time: 10/08/23  9:42 PM  Result Value Ref Range   Procalcitonin <0.10 ng/mL    Comment:        Interpretation: PCT (Procalcitonin) <= 0.5 ng/mL: Systemic infection (sepsis) is not likely. Local bacterial infection is possible. (NOTE)       Sepsis PCT Algorithm           Lower Respiratory Tract                                      Infection PCT Algorithm    ----------------------------     ----------------------------         PCT < 0.25 ng/mL                PCT < 0.10 ng/mL          Strongly encourage             Strongly discourage   discontinuation of antibiotics    initiation of antibiotics    ----------------------------     -----------------------------       PCT 0.25 - 0.50 ng/mL            PCT 0.10 - 0.25 ng/mL               OR       >  80% decrease in PCT            Discourage  initiation of                                            antibiotics      Encourage discontinuation           of antibiotics    ----------------------------     -----------------------------         PCT >= 0.50 ng/mL              PCT 0.26 - 0.50 ng/mL               AND        <80% decrease in PCT             Encourage initiation of                                             antibiotics       Encourage continuation           of antibiotics    ----------------------------     -----------------------------        PCT >= 0.50 ng/mL                  PCT > 0.50 ng/mL               AND         increase in PCT                  Strongly encourage                                      initiation of antibiotics    Strongly encourage escalation           of antibiotics                                     -----------------------------                                           PCT <= 0.25 ng/mL                                                 OR                                        > 80% decrease in PCT                                      Discontinue / Do not initiate  antibiotics  Performed at [facility], [address] [place], [place] [address]   Phosphorus     Status: None   Collection Time: 10/08/23  9:42 PM  Result Value Ref Range   Phosphorus 3.5 2.5 - 4.6 mg/dL    Comment: Performed at [facility], [address], [place], [place] [address]  Troponin I (High Sensitivity)     Status: Abnormal   Collection Time: 10/08/23 11:08 PM  Result Value Ref Range   Troponin I (High Sensitivity) 26 (H) <18 ng/L    Comment: (NOTE) Elevated high sensitivity troponin I (hsTnI) values and significant  changes across serial measurements may suggest ACS but many other  chronic and acute conditions are known to elevate hsTnI results.  Refer to the "Links" section for chest pain algorithms and additional  guidance. Performed at [facility], [address], [place], [place] [address]   CBC     Status: Abnormal   Collection Time: 10/09/23  4:43 AM  Result Value Ref Range   WBC 8.2 4.0 - 10.5 K/uL   RBC 5.41 (H) 3.87 - 5.11 MIL/uL   Hemoglobin 14.5 12.0 - 15.0 g/dL   HCT 91.4 (H) 78.2 - 95.6 %   MCV 85.2 80.0 - 100.0 fL   MCH 26.8 26.0 - 34.0 pg   MCHC 31.5 30.0 - 36.0 g/dL   RDW 21.3 08.6 - 57.8 %   Platelets 168 150 - 400 K/uL   nRBC 0.0 0.0 - 0.2 %    Comment: Performed at [facility], [address] [place], [place] [address]  Vitamin B12     Status: Abnormal   Collection Time: 10/09/23  5:55 AM  Result Value Ref Range   Vitamin B-12 3,128 (H) 180 - 914 pg/mL    Comment: (NOTE) This assay is not validated for testing neonatal or myeloproliferative syndrome specimens for Vitamin B12 levels. Performed at [facility], [address], [place], [place] [address]   Comprehensive metabolic panel     Status: Abnormal   Collection Time: 10/09/23  5:55 AM  Result Value Ref Range   Sodium 140 135 - 145 mmol/L   Potassium 4.1 3.5 - 5.1 mmol/L    Comment: HEMOLYSIS AT THIS LEVEL MAY AFFECT RESULT   Chloride 100 98 - 111 mmol/L   CO2 30 22 - 32 mmol/L   Glucose, Bld 112 (H) 70 - 99 mg/dL    Comment: Glucose reference range applies only to samples taken after fasting for at least 8 hours.   BUN 23 8 - 23 mg/dL   Creatinine, Ser 1.32 (H) 0.44 - 1.00 mg/dL   Calcium 9.1 8.9 - 44.0 mg/dL   Total Protein 6.1 (L) 6.5 - 8.1 g/dL   Albumin 3.3 (L) 3.5 - 5.0 g/dL   AST 28 15 - 41 U/L    Comment: HEMOLYSIS AT THIS LEVEL MAY AFFECT RESULT   ALT 16 0 - 44 U/L    Comment: HEMOLYSIS AT THIS LEVEL MAY AFFECT RESULT   Alkaline Phosphatase 39 38 - 126 U/L   Total Bilirubin 1.1 <1.2 mg/dL    Comment: HEMOLYSIS AT THIS LEVEL MAY AFFECT RESULT   GFR, Estimated 34 (L) >60 mL/min    Comment: (NOTE) Calculated using the CKD-EPI Creatinine Equation (2021)    Anion gap 10 5 - 15    Comment: Performed at [facility], [address], [place], [place] [address]  Folate     Status: None   Collection Time: 10/09/23  5:55 AM  Result Value Ref Range   Folate 16.8 >5.9 ng/mL    Comment: HEMOLYSIS AT THIS LEVEL MAY AFFECT RESULT Performed at [facility], [address] [place], [place] [address]   Magnesium     Status: None   Collection Time: 10/09/23  5:55 AM  Result Value Ref Range   Magnesium 1.9 1.7 - 2.4 mg/dL    Comment: Performed at [facility], [address] [place], [place] [address]  Phosphorus     Status: None   Collection Time: 10/09/23  5:55 AM  Result Value Ref Range   Phosphorus 3.8 2.5 - 4.6 mg/dL    Comment: Performed at [facility], [address] [place], [place] [address]  Ammonia     Status: None   Collection Time: 10/09/23  8:00 AM  Result Value Ref Range   Ammonia 12 9 - 35 umol/L    Comment: Performed at [facility], [address] [place], [place] [address]   US Abdomen Limited RUQ (LIVER/GB)  Result Date: 10/09/2023 CLINICAL DATA:  Increased ammonia level EXAM: ULTRASOUND ABDOMEN LIMITED RIGHT UPPER QUADRANT COMPARISON:  None Available. FINDINGS: Gallbladder: Multiple stones within the gallbladder measuring up 21.1 cm. No wall thickening or sonographic Murphy sign. Common bile duct: Diameter: Normal caliber, 3 mm Liver: No focal lesion identified. Within normal limits in parenchymal echogenicity. Portal vein is patent on color Doppler imaging with normal direction of blood flow towards the liver. Other: Right pleural effusion noted IMPRESSION: Cholelithiasis.  No sonographic evidence of acute cholecystitis. Right pleural effusion. Electronically Signed   By: Charlett Nose M.D.   On: 10/09/2023 00:33   MR BRAIN WO CONTRAST  Result Date: 10/08/2023 CLINICAL DATA:  Delirium EXAM: MRI HEAD WITHOUT CONTRAST TECHNIQUE: Multiplanar, multiecho pulse sequences of the brain and surrounding structures were obtained without intravenous contrast.  COMPARISON:  None Available. FINDINGS: Brain: No acute infarct, mass effect or extra-axial collection. No chronic microhemorrhage or siderosis. There is multifocal hyperintense T2-weighted signal within the white matter. Generalized volume loss. The midline structures are normal. Vascular: Normal flow voids Skull and upper cervical spine: Normal marrow signal Sinuses/Orbits: Right mastoid effusion. Paranasal sinuses are clear. Normal orbits. Other: None IMPRESSION: 1. No acute intracranial abnormality. 2. Chronic small vessel ischemia and volume loss. Electronically Signed   By: Deatra Robinson M.D.   On: 10/08/2023 23:50   CT HEAD WO CONTRAST  Result Date: 10/08/2023 CLINICAL DATA:  Altered level of consciousness, confusion, hallucinations EXAM: CT HEAD WITHOUT CONTRAST TECHNIQUE: Contiguous axial images were obtained from the base of the skull through the vertex without intravenous contrast. RADIATION DOSE REDUCTION: This exam was performed according to the departmental dose-optimization program which includes automated exposure control, adjustment of the mA and/or kV according to patient size and/or use of iterative reconstruction technique. COMPARISON:  09/27/2023 FINDINGS: Brain: No evidence of acute infarct or hemorrhage. Lateral ventricles and midline structures are stable. No acute extra-axial fluid collections. No mass effect. Vascular: No hyperdense vessel or unexpected calcification. Skull: Normal. Negative for fracture or focal lesion. Sinuses/Orbits: No acute finding.  Stable right mastoid effusion. Other: None. IMPRESSION: 1. Stable head CT, no acute intracranial process. Electronically Signed   By: Sharlet Salina M.D.   On: 10/08/2023 18:20   DG Chest 2 View  Result Date: 10/08/2023 CLINICAL DATA:  Shortness of breath. EXAM: CHEST - 2 VIEW  COMPARISON:  September 26, 2023. FINDINGS: Stable cardiomediastinal silhouette. Stable reticular densities are noted throughout both lungs which may represent  scarring, but acute superimposed edema or atypical inflammation cannot be excluded. Bony thorax is unremarkable. IMPRESSION: Stable reticular densities are noted throughout both lungs which may represent scarring, but acute superimposed edema or atypical inflammation cannot be excluded. Aortic Atherosclerosis (ICD10-I70.0). Electronically Signed   By: Lupita Raider M.D.   On: 10/08/2023 16:55    Pending Labs Unresulted Labs (From admission, onward)     Start     Ordered   10/10/23 0500  Basic metabolic panel  Tomorrow morning,   R        10/09/23 1149            Vitals/Pain Today's Vitals   10/09/23 1140 10/09/23 1145 10/09/23 1215 10/09/23 1220  BP:  (!) 112/101    Pulse:   99 (!) 122  Resp:  (!) 29 (!) 29 (!) 29  Temp: (!) 96.1 F (35.6 C)     TempSrc: Axillary     SpO2:   98% 99%  PainSc:        Isolation Precautions No active isolations  Medications Medications  apixaban (ELIQUIS) tablet 5 mg (5 mg Oral Given 10/09/23 1300)  isosorbide mononitrate (IMDUR) 24 hr tablet 30 mg (30 mg Oral Given 10/09/23 1301)  levothyroxine (SYNTHROID) tablet 125 mcg (125 mcg Oral Not Given 10/09/23 0539)  metoprolol succinate (TOPROL-XL) 24 hr tablet 100 mg (100 mg Oral Given 10/09/23 1301)  traZODone (DESYREL) tablet 100 mg (100 mg Oral Given 10/09/23 0052)  acetaminophen (TYLENOL) tablet 650 mg (has no administration in time range)    Or  acetaminophen (TYLENOL) suppository 650 mg (has no administration in time range)  albuterol (PROVENTIL) (2.5 MG/3ML) 0.083% nebulizer solution 2.5 mg (has no administration in time range)  guaiFENesin (MUCINEX) 12 hr tablet 600 mg (600 mg Oral Given 10/09/23 1302)  pravastatin (PRAVACHOL) tablet 80 mg (has no administration in time range)  lactulose (CHRONULAC) 10 GM/15ML solution 30 g (30 g Oral Given 10/09/23 1300)  furosemide (LASIX) injection 60 mg (60 mg Intravenous Given 10/09/23 1301)  potassium chloride SA (KLOR-CON M) CR tablet 40 mEq (40 mEq  Oral Given 10/08/23 1748)  furosemide (LASIX) injection 60 mg (60 mg Intravenous Given 10/08/23 1748)  lactulose (CHRONULAC) 10 GM/15ML solution 30 g (30 g Oral Given 10/08/23 1927)    Mobility walks     Focused Assessments Neuro Assessment Handoff:  Swallow screen pass? Yes          Neuro Assessment:   Neuro Checks:      Has TPA been given? No If patient is a Neuro Trauma and patient is going to OR before floor call report to 4N Charge nurse: [phone] or [phone]   R Recommendations: See Admitting Provider Note  Report given to:   Additional Notes:

## 2023-10-09 NOTE — ED Notes (Signed)
Pt. Continues to rip off EKG cables as well as pulse ox due to AMS.RN continues to reapply monitor equipment.

## 2023-10-09 NOTE — Assessment & Plan Note (Signed)
-   Check TSH continue home medications Synthroid at  125 mcg po q day  

## 2023-10-09 NOTE — Assessment & Plan Note (Signed)
-  chronic avoid nephrotoxic medications such as NSAIDs, Vanco Zosyn combo,  avoid hypotension, continue to follow renal function  

## 2023-10-09 NOTE — ED Notes (Signed)
Juice, water, and food placed at pt. Bedside. Pt. Unable to stay wake during interaction when asked if she would like to eat.

## 2023-10-09 NOTE — ED Notes (Signed)
Patient O2 value recorded a 88% while sleeping. Nasal canula O2 at 2 liters administered for a few minutes. Patient removed O2 and became combative. Several attempts to reorient patient unsuccesful. Patient refuses O2.

## 2023-10-09 NOTE — Consult Note (Addendum)
BH ED ASSESSMENT   Reason for Consult: Psych Consult, "hallucination" Referring Physician:  Therisa Doyne, MD  Patient Identification: [name] MRN:  [id number] ED Chief Complaint: Hallucinations  Diagnosis:  Principal Problem:   Hallucinations Active Problems:   Essential hypertension   Persistent atrial fibrillation (HCC)   Acute on chronic diastolic heart failure (HCC)   CKD stage 3b, GFR 30-44 ml/min (HCC)   Prolonged QT interval   Hypothyroidism   Acute on chronic systolic CHF (congestive heart failure) (HCC)   Increased ammonia level   Metabolic encephalopathy   ED Assessment Time Calculation: Start Time: 1300 Stop Time: 1400 Total Time in Minutes (Assessment Completion): 60   Subjective:    [name] is a 77 y.o. Caucasian female with a past psychiatric history that includes none, and pertinent medical morbidities/history that includes chronic systolic CHF, moderately reduced RF, persistent A-fib, history of pericardial effusion status post pericardiocentesis 2/22, CKD 3A, hypertension, obesity, and hypothyroidism, who presented this encounter by way of family due to concerns for distressing hallucinations at the strong recommendation of the patient's outpatient cardiologist and primary care provider.  Patient is currently voluntary and not medically clear at this time, plan per medical team is for medical hospitalization.  HPI:   Patient seen today at the [facility] emergency department for face-to-face psychiatric evaluation.  Upon evaluation, patient endorses that she was brought into the hospital due to worsening edema due to her medical comorbidities and "frightening" experiences lately of seeing people in the family home who family tells her that are not there, and hearing things also that family tells her are not there.  Expanding on this, patient endorses that all throughout the day she has been frequently seeing people who do not belong in her home  inside the house and hearing music, people talking, and strange sounds such as animals at times.   Patient endorses that because of these experiences that are distressing to her, states that she has been having trouble sleeping, trouble eating, and just generally living her day-to-day life with her husband whom she lives with.  Patient endorses that this started around 2 weeks ago, states that, "we haven't a clue why, maybe they think I'm just crazy", and then states that it has been suggested strongly to her and family that possibly due to a lot of medication changes that have been made, that this is the cause.  Patient endorses during evaluation in an euthymic mood and presents with a congruent affect with appropriate eye contact and interpersonal style.  Patient orientation is intact upon assessment, no concerns for fluctuations of consciousness.  Patient endorses that last night she had some disturbances of hearing and seeing things, states that family at the bedside made her aware that she was having disturbances, but denies any since last night and/or throughout the day thus far, and objectively, does not appear to be responding to internal stimuli and/or appear to be hallucinating.  Patient endorses that in the hospital she feels already better, states that because of being outside of the home where she has been having the, "frightening" experiences of hearing and seeing things severely, states that she slept fairly well over the night, and endorses that she has also been eating fairly well thus far.  Patient endorses she has no mental health history, denies any outpatient or inpatient utilization of mental health services.  Patient endorses no suicidal homicidal ideations, denies suicide attempts, and denies any history of self-injurious behavior.  Patient endorses  no instability in her mood, outside of wanting to have these experiences that are frightening to her "go away."  Patient endorses that  she has no history of dementia, states she is amenable to cognitive assessment and participates actively with this Clinical research associate and performs well; appreciably presented with almost no deficits in visuospatial/executive functioning, naming, memory, attention, language, abstraction, and or delayed recall, could also spell WORLD backwards. UDS negative.   Collateral, Lew Dawes, daughter, spoken to over the phone @[phone]   Call placed and extensive information able to be obtained from the patient's daughter.  In short summary, patient's daughter reports that family has not been noticing confusion with the patient largely, but has noticed severe hallucinations largely since Lasix 40 mg p.o. twice daily was initiated 09/25/23, and unfortunately after discontinuing Lasix and starting Bumex as recommended, hallucinations have persisted, leading to concern by patient's outpatient team that patient needs higher level of care, thus patient was brought to the emergency department for further workup and care measures at their strong recommendations.  Expanding on the details around hallucinations that have been endorsed by the patient and appreciated by family, patient's daughter states that hallucinations have been happening all throughout the day causing the patient to not be able to focus enough to eat and/or sleep very well at all. Patient's daughter reports that because of hallucinations, the patient has frequently left the family home and wandered to neighbors houses to try to avoid the hallucinations she has been experiencing.  Patient's daughter endorses no symptomology consistent with dementia upon questioning.  Patient's daughter endorses no symptomology consistent with delirium upon questioning.  Patient's daughter denies any history of the patient having dementia and/or mental illness. Patient's daughter denies any drugs, EtOH, and/or tobacco use by the patient.  **Listed below is a short timeline to  aid in recent events that have transpired which have led to this encounter, as it relates to incidents of medication changes and hallucinations.  08/01/23, [place]- Hyponatremia, brief incident of hallucinations (reported by family); on Lasix 20mg    08/28/23, [facility]- Hyponatremia, Hallucinations more prominent (both day and night), stopped at discharge; on Lasix 20mg    09/10/23, Outpatient cardiology visit; Lasix increased to 20mg  BID  09/24/23, Pharmacist visit and laboratory studies drawn  09/25/23, Laboratory studies concerning, Lasix increased to 40 mg p.o. twice daily, hallucinations reported by family now appreciable and severe  09/26/23, Due to severe hallucinations, family had patient brought into [facility] for evaluation, family denies confusion during this encounter; reportedly discharged to follow-up with outpatient  10/02/23, Continued concern for Lasix being the cause of severe hallucinations, diuretic switched to Bumex 1 mg twice daily  10/03/23, Placed on trazodone 100 mg nightly by PCP  10/08/23, Some endorsements by family of improvements in sleep and less mentioning of hallucinations with addition of trazodone, but this was short-lived; conversation reported to be had by patient's daughter between cardiology and PCP and recommendation was given to present to the hospital for further evaluation, workup, and care leading to this encounter.   Past Psychiatric History: None endorsed or reported  Risk to Self or Others: Is the patient at risk to self? No Has the patient been a risk to self in the past 6 months? No Has the patient been a risk to self within the distant past? No Is the patient a risk to others? No Has the patient been a risk to others in the past 6 months? No Has the patient been a risk to others  within the distant past? No  [place] Scale:  Flowsheet Row ED to Hosp-Admission (Current) from 10/08/2023 in [facility] Emergency Department at [facility] ED from 09/26/2023 in [facility] Emergency Department at [facility] ED to Hosp-Admission (Discharged) from 08/28/2023 in [facility] EAST MEDICAL UNIT  C-SSRS RISK CATEGORY No Risk No Risk No Risk       Substance Abuse: None endorsed or reported  Past Medical History:  Past Medical History:  Diagnosis Date   Arthritis    CHF (congestive heart failure) (HCC)    Chronic HFrEF (heart failure with reduced ejection fraction) (HCC)    CKD (chronic kidney disease), stage III (HCC)    Colon polyps    Goiter    Hyperglycemia    Hyperlipidemia    Hypertension    Hypothyroidism    Morbid obesity (HCC)    Osteopenia    Permanent atrial fibrillation [facility])     Past Surgical History:  Procedure Laterality Date   CARDIOVASCULAR STRESS TEST     COLONOSCOPY     IR THORACENTESIS ASP PLEURAL SPACE W/IMG GUIDE  01/26/2021   LUMBAR DISC SURGERY  2012   PERICARDIOCENTESIS N/A 01/24/2021   Procedure: PERICARDIOCENTESIS;  Surgeon: Yates Decamp, MD;  Location: [facility] INVASIVE CV LAB;  Service: Cardiovascular;  Laterality: N/A;   POLYPECTOMY     RIGHT/LEFT HEART CATH AND CORONARY ANGIOGRAPHY N/A 09/01/2023   Procedure: RIGHT/LEFT HEART CATH AND CORONARY ANGIOGRAPHY;  Surgeon: Marykay Lex, MD;  Location: [facility] INVASIVE CV LAB;  Service: Cardiovascular;  Laterality: N/A;   TOTAL KNEE ARTHROPLASTY Left 05/29/2017   Procedure: LEFT TOTAL KNEE ARTHROPLASTY;  Surgeon: Tarry Kos, MD;  Location: [facility] OR;  Service: Orthopedics;  Laterality: Left;   TUBAL LIGATION  1977   UPPER GASTROINTESTINAL ENDOSCOPY  12/29/2019   US ECHOCARDIOGRAPHY     Family History:  Family History  Problem Relation Age of Onset   CVA Mother    Heart disease Brother    Heart disease Father    Colon cancer Neg Hx    Stomach cancer Neg Hx    Colon polyps Neg Hx    Esophageal cancer Neg Hx    Rectal cancer Neg Hx    Family Psychiatric  History: None endorsed Social History:  Social  History   Substance and Sexual Activity  Alcohol Use No     Social History   Substance and Sexual Activity  Drug Use No    Social History   Socioeconomic History   Marital status: Married    Spouse name: Not on file   Number of children: 2   Years of education: Not on file   Highest education level: Not on file  Occupational History   Occupation: retired  Tobacco Use   Smoking status: Never   Smokeless tobacco: Never  Vaping Use   Vaping status: Never Used  Substance and Sexual Activity   Alcohol use: No   Drug use: No   Sexual activity: Not on file  Other Topics Concern   Not on file  Social History Narrative   Not on file   Social Determinants of Health   Financial Resource Strain: Not on file  Food Insecurity: No Food Insecurity (10/08/2023)   Hunger Vital Sign    Worried About Running Out of Food in the Last Year: Never true    Ran Out of Food in the Last Year: Never true  Transportation Needs: No Transportation Needs (10/08/2023)  PRAPARE - Administrator, Civil Service (Medical): No    Lack of Transportation (Non-Medical): No  Physical Activity: Not on file  Stress: Not on file  Social Connections: Not on file   Additional Social History:    Allergies:   Allergies  Allergen Reactions   Spironolactone Other (See Comments)    Hyponatremia-  the level of sodium in blood is too high   Atorvastatin Other (See Comments)    "Aches"     Labs:  Results for orders placed or performed during the hospital encounter of 10/08/23 (from the past 48 hour(s))  Comprehensive metabolic panel     Status: Abnormal   Collection Time: 10/08/23  2:15 PM  Result Value Ref Range   Sodium 140 135 - 145 mmol/L   Potassium 3.6 3.5 - 5.1 mmol/L   Chloride 102 98 - 111 mmol/L   CO2 29 22 - 32 mmol/L   Glucose, Bld 109 (H) 70 - 99 mg/dL    Comment: Glucose reference range applies only to samples taken after fasting for at least 8 hours.   BUN 24 (H) 8 - 23  mg/dL   Creatinine, Ser 0.98 (H) 0.44 - 1.00 mg/dL   Calcium 9.2 8.9 - 11.9 mg/dL   Total Protein 6.3 (L) 6.5 - 8.1 g/dL   Albumin 3.4 (L) 3.5 - 5.0 g/dL   AST 20 15 - 41 U/L   ALT 15 0 - 44 U/L   Alkaline Phosphatase 42 38 - 126 U/L   Total Bilirubin 0.7 <1.2 mg/dL   GFR, Estimated 32 (L) >60 mL/min    Comment: (NOTE) Calculated using the CKD-EPI Creatinine Equation (2021)    Anion gap 9 5 - 15    Comment: Performed at [facility] Lab, [address], [place], [place] [address]  CBC     Status: Abnormal   Collection Time: 10/08/23  2:15 PM  Result Value Ref Range   WBC 7.4 4.0 - 10.5 K/uL   RBC 5.24 (H) 3.87 - 5.11 MIL/uL   Hemoglobin 13.8 12.0 - 15.0 g/dL   HCT 95.6 21.3 - 08.6 %   MCV 84.9 80.0 - 100.0 fL   MCH 26.3 26.0 - 34.0 pg   MCHC 31.0 30.0 - 36.0 g/dL   RDW 57.8 46.9 - 62.9 %   Platelets 274 150 - 400 K/uL   nRBC 0.0 0.0 - 0.2 %    Comment: Performed at [facility] Lab, [address] [place], [place] [address]  Urinalysis, w/ Reflex to Culture (Infection Suspected) -Urine, Clean Catch     Status: Abnormal   Collection Time: 10/08/23  2:54 PM  Result Value Ref Range   Specimen Source URINE, CLEAN CATCH    Color, Urine STRAW (A) YELLOW   APPearance CLEAR CLEAR   Specific Gravity, Urine 1.008 1.005 - 1.030   pH 6.0 5.0 - 8.0   Glucose, UA NEGATIVE NEGATIVE mg/dL   Hgb urine dipstick NEGATIVE NEGATIVE   Bilirubin Urine NEGATIVE NEGATIVE   Ketones, ur NEGATIVE NEGATIVE mg/dL   Protein, ur NEGATIVE NEGATIVE mg/dL   Nitrite NEGATIVE NEGATIVE   Leukocytes,Ua NEGATIVE NEGATIVE   RBC / HPF 0-5 0 - 5 RBC/hpf   WBC, UA 0-5 0 - 5 WBC/hpf    Comment:        Reflex urine culture not performed if WBC <=10, OR if Squamous epithelial cells >5. If Squamous epithelial cells >5 suggest recollection.    Bacteria, UA RARE (A)  NONE SEEN   Squamous Epithelial / HPF 0-5 0 - 5 /HPF    Comment: Performed at [facility] Lab, [address] [place], [place] [address]   Rapid urine drug screen (hospital performed)     Status: None   Collection Time: 10/08/23  2:54 PM  Result Value Ref Range   Opiates NONE DETECTED NONE DETECTED   Cocaine NONE DETECTED NONE DETECTED   Benzodiazepines NONE DETECTED NONE DETECTED   Amphetamines NONE DETECTED NONE DETECTED   Tetrahydrocannabinol NONE DETECTED NONE DETECTED   Barbiturates NONE DETECTED NONE DETECTED    Comment: (NOTE) DRUG SCREEN FOR MEDICAL PURPOSES ONLY.  IF CONFIRMATION IS NEEDED FOR ANY PURPOSE, NOTIFY LAB WITHIN 5 DAYS.  LOWEST DETECTABLE LIMITS FOR URINE DRUG SCREEN Drug Class                     Cutoff (ng/mL) Amphetamine and metabolites    1000 Barbiturate and metabolites    200 Benzodiazepine                 200 Opiates and metabolites        300 Cocaine and metabolites        300 THC                            50 Performed at [facility] Lab, [address], [place], [place] [address]   Troponin I (High Sensitivity)     Status: Abnormal   Collection Time: 10/08/23  3:33 PM  Result Value Ref Range   Troponin I (High Sensitivity) 34 (H) <18 ng/L    Comment: (NOTE) Elevated high sensitivity troponin I (hsTnI) values and significant  changes across serial measurements may suggest ACS but many other  chronic and acute conditions are known to elevate hsTnI results.  Refer to the "Links" section for chest pain algorithms and additional  guidance. Performed at [facility] Lab, [address] [place], [place] [address]   Brain natriuretic peptide     Status: Abnormal   Collection Time: 10/08/23  3:33 PM  Result Value Ref Range   B Natriuretic Peptide 1,027.2 (H) 0.0 - 100.0 pg/mL    Comment: Performed at [facility] Lab, [address] [place], [place] [address]  TSH     Status: None   Collection Time: 10/08/23  3:33 PM  Result Value Ref Range   TSH 0.933 0.350 - 4.500 uIU/mL    Comment: Performed by a 3rd Generation assay with a functional sensitivity of <=0.01  uIU/mL. Performed at [facility] Lab, [address], [place], [place] [address]   Magnesium     Status: None   Collection Time: 10/08/23  3:33 PM  Result Value Ref Range   Magnesium 2.0 1.7 - 2.4 mg/dL    Comment: Performed at [facility] Lab, [address] [place], [place] [address]  CBG monitoring, ED     Status: Abnormal   Collection Time: 10/08/23  3:56 PM  Result Value Ref Range   Glucose-Capillary 102 (H) 70 - 99 mg/dL    Comment: Glucose reference range applies only to samples taken after fasting for at least 8 hours.  Ethanol     Status: None   Collection Time: 10/08/23  4:29 PM  Result Value Ref Range   Alcohol, Ethyl (B) <10 <10 mg/dL    Comment: (NOTE) Lowest detectable limit for serum alcohol is  10 mg/dL.  For medical purposes only. Performed at [facility] Lab, [address], [place], [place] [address]   Ammonia     Status: Abnormal   Collection Time: 10/08/23  4:31 PM  Result Value Ref Range   Ammonia 80 (H) 9 - 35 umol/L    Comment: Performed at [facility] Lab, [address], [place], [place] [address]  Troponin I (High Sensitivity)     Status: Abnormal   Collection Time: 10/08/23  5:33 PM  Result Value Ref Range   Troponin I (High Sensitivity) 33 (H) <18 ng/L    Comment: (NOTE) Elevated high sensitivity troponin I (hsTnI) values and significant  changes across serial measurements may suggest ACS but many other  chronic and acute conditions are known to elevate hsTnI results.  Refer to the "Links" section for chest pain algorithms and additional  guidance. Performed at [facility], [address], [place], [place] [address]   Blood gas, venous     Status: Abnormal   Collection Time: 10/08/23  9:01 PM  Result Value Ref Range   pH, Ven 7.43 7.25 - 7.43   pCO2, Ven 58 44 - 60 mmHg   pO2, Ven <31 (LL) 32 - 45 mmHg    Comment: CRITICAL RESULT CALLED TO, READ BACK BY AND VERIFIED WITH: R.Melquiades Kovar RN 2127 10/08/2023 BY G.GANADEN     Bicarbonate 38.5 (H) 20.0 - 28.0 mmol/L   Acid-Base Excess 11.8 (H) 0.0 - 2.0 mmol/L   O2 Saturation 32.8 %   Patient temperature 37.0     Comment: Performed at [facility] Lab, [address] [place], [place] [address]  Troponin I (High Sensitivity)     Status: Abnormal   Collection Time: 10/08/23  9:01 PM  Result Value Ref Range   Troponin I (High Sensitivity) 23 (H) <18 ng/L    Comment: (NOTE) Elevated high sensitivity troponin I (hsTnI) values and significant  changes across serial measurements may suggest ACS but many other  chronic and acute conditions are known to elevate hsTnI results.  Refer to the "Links" section for chest pain algorithms and additional  guidance. Performed at [facility] Lab, [address], [place], [place] [address]   CK     Status: None   Collection Time: 10/08/23  9:42 PM  Result Value Ref Range   Total CK 44 38 - 234 U/L    Comment: Performed at [facility] Lab, [address] [place], [place] [address]  Procalcitonin     Status: None   Collection Time: 10/08/23  9:42 PM  Result Value Ref Range   Procalcitonin <0.10 ng/mL    Comment:        Interpretation: PCT (Procalcitonin) <= 0.5 ng/mL: Systemic infection (sepsis) is not likely. Local bacterial infection is possible. (NOTE)       Sepsis PCT Algorithm           Lower Respiratory Tract                                      Infection PCT Algorithm    ----------------------------     ----------------------------         PCT < 0.25 ng/mL                PCT < 0.10 ng/mL          Strongly encourage  Strongly discourage   discontinuation of antibiotics    initiation of antibiotics    ----------------------------     -----------------------------       PCT 0.25 - 0.50 ng/mL            PCT 0.10 - 0.25 ng/mL               OR       >80% decrease in PCT            Discourage initiation of                                            antibiotics      Encourage discontinuation            of antibiotics    ----------------------------     -----------------------------         PCT >= 0.50 ng/mL              PCT 0.26 - 0.50 ng/mL               AND        <80% decrease in PCT             Encourage initiation of                                             antibiotics       Encourage continuation           of antibiotics    ----------------------------     -----------------------------        PCT >= 0.50 ng/mL                  PCT > 0.50 ng/mL               AND         increase in PCT                  Strongly encourage                                      initiation of antibiotics    Strongly encourage escalation           of antibiotics                                     -----------------------------                                           PCT <= 0.25 ng/mL                                                 OR                                        >  80% decrease in PCT                                      Discontinue / Do not initiate                                             antibiotics  Performed at [facility] Lab, [address] [place], [place] [address]   Phosphorus     Status: None   Collection Time: 10/08/23  9:42 PM  Result Value Ref Range   Phosphorus 3.5 2.5 - 4.6 mg/dL    Comment: Performed at [facility] Lab, [address] [place], [place] [address]  Troponin I (High Sensitivity)     Status: Abnormal   Collection Time: 10/08/23 11:08 PM  Result Value Ref Range   Troponin I (High Sensitivity) 26 (H) <18 ng/L    Comment: (NOTE) Elevated high sensitivity troponin I (hsTnI) values and significant  changes across serial measurements may suggest ACS but many other  chronic and acute conditions are known to elevate hsTnI results.  Refer to the "Links" section for chest pain algorithms and additional  guidance. Performed at [facility] Lab, [address] [place], [place] [address]   CBC     Status: Abnormal   Collection Time:  10/09/23  4:43 AM  Result Value Ref Range   WBC 8.2 4.0 - 10.5 K/uL   RBC 5.41 (H) 3.87 - 5.11 MIL/uL   Hemoglobin 14.5 12.0 - 15.0 g/dL   HCT 62.1 (H) 30.8 - 65.7 %   MCV 85.2 80.0 - 100.0 fL   MCH 26.8 26.0 - 34.0 pg   MCHC 31.5 30.0 - 36.0 g/dL   RDW 84.6 96.2 - 95.2 %   Platelets 168 150 - 400 K/uL   nRBC 0.0 0.0 - 0.2 %    Comment: Performed at [facility] Lab, [address], [place], [place] [address]  Vitamin B12     Status: Abnormal   Collection Time: 10/09/23  5:55 AM  Result Value Ref Range   Vitamin B-12 3,128 (H) 180 - 914 pg/mL    Comment: (NOTE) This assay is not validated for testing neonatal or myeloproliferative syndrome specimens for Vitamin B12 levels. Performed at [facility] Lab, [address], [place], [place] [address]   Comprehensive metabolic panel     Status: Abnormal   Collection Time: 10/09/23  5:55 AM  Result Value Ref Range   Sodium 140 135 - 145 mmol/L   Potassium 4.1 3.5 - 5.1 mmol/L    Comment: HEMOLYSIS AT THIS LEVEL MAY AFFECT RESULT   Chloride 100 98 - 111 mmol/L   CO2 30 22 - 32 mmol/L   Glucose, Bld 112 (H) 70 - 99 mg/dL    Comment: Glucose reference range applies only to samples taken after fasting for at least 8 hours.   BUN 23 8 - 23 mg/dL   Creatinine, Ser 2.72 (H) 0.44 - 1.00 mg/dL   Calcium 9.1 8.9 - 53.6 mg/dL   Total Protein 6.1 (L) 6.5 - 8.1 g/dL   Albumin 3.3 (L) 3.5 - 5.0 g/dL   AST 28 15 - 41 U/L    Comment: HEMOLYSIS AT THIS LEVEL MAY AFFECT RESULT   ALT 16  0 - 44 U/L    Comment: HEMOLYSIS AT THIS LEVEL MAY AFFECT RESULT   Alkaline Phosphatase 39 38 - 126 U/L   Total Bilirubin 1.1 <1.2 mg/dL    Comment: HEMOLYSIS AT THIS LEVEL MAY AFFECT RESULT   GFR, Estimated 34 (L) >60 mL/min    Comment: (NOTE) Calculated using the CKD-EPI Creatinine Equation (2021)    Anion gap 10 5 - 15    Comment: Performed at [facility] Lab, [address], [place], [place] [address]  Folate     Status: None   Collection Time:  10/09/23  5:55 AM  Result Value Ref Range   Folate 16.8 >5.9 ng/mL    Comment: HEMOLYSIS AT THIS LEVEL MAY AFFECT RESULT Performed at [facility] Lab, [address], [place], [place] [address]   Magnesium     Status: None   Collection Time: 10/09/23  5:55 AM  Result Value Ref Range   Magnesium 1.9 1.7 - 2.4 mg/dL    Comment: Performed at [facility], [address] [place], [place] [address]  Phosphorus     Status: None   Collection Time: 10/09/23  5:55 AM  Result Value Ref Range   Phosphorus 3.8 2.5 - 4.6 mg/dL    Comment: Performed at [facility], [address] [place], [place] [address]  Ammonia     Status: None   Collection Time: 10/09/23  8:00 AM  Result Value Ref Range   Ammonia 12 9 - 35 umol/L    Comment: Performed at [facility], [address], [place], [place] [address]    Current Facility-Administered Medications  Medication Dose Route Frequency Provider Last Rate Last Admin   acetaminophen (TYLENOL) tablet 650 mg  650 mg Oral Q6H PRN Therisa Doyne, MD       Or   acetaminophen (TYLENOL) suppository 650 mg  650 mg Rectal Q6H PRN Doutova, Anastassia, MD       albuterol (PROVENTIL) (2.5 MG/3ML) 0.083% nebulizer solution 2.5 mg  2.5 mg Nebulization Q2H PRN Doutova, Anastassia, MD       apixaban (ELIQUIS) tablet 5 mg  5 mg Oral BID Doutova, Anastassia, MD   5 mg at 10/09/23 1300   furosemide (LASIX) injection 60 mg  60 mg Intravenous BID Yvonna Alanis L, PA-C   60 mg at 10/09/23 1301   guaiFENesin (MUCINEX) 12 hr tablet 600 mg  600 mg Oral BID Doutova, Anastassia, MD   600 mg at 10/09/23 1302   isosorbide mononitrate (IMDUR) 24 hr tablet 30 mg  30 mg Oral Daily Doutova, Anastassia, MD   30 mg at 10/09/23 1301   lactulose (CHRONULAC) 10 GM/15ML solution 30 g  30 g Oral BID Zannie Cove, MD   30 g at 10/09/23 1300   levothyroxine (SYNTHROID) tablet 125 mcg  125 mcg Oral QAC breakfast Doutova, Anastassia, MD       metoprolol succinate  (TOPROL-XL) 24 hr tablet 100 mg  100 mg Oral Daily Doutova, Anastassia, MD   100 mg at 10/09/23 1301   pravastatin (PRAVACHOL) tablet 80 mg  80 mg Oral Daily Doutova, Anastassia, MD       traZODone (DESYREL) tablet 100 mg  100 mg Oral QHS Doutova, Anastassia, MD   100 mg at 10/09/23 1308   Current Outpatient Medications  Medication Sig Dispense Refill   acetaminophen (TYLENOL) 325 MG tablet Take 325-650 mg by mouth daily as needed for mild pain or headache.  bumetanide (BUMEX) 1 MG tablet Take 1 tablet (1 mg total) by mouth daily. 90 tablet 3   Calcium Carb-Cholecalciferol (CALTRATE 600+D3 PO) Take 1 tablet by mouth every evening.     Chlorphen-Pseudoephed-APAP (CORICIDIN D PO) Take 2 capsules by mouth daily.     [employer] IRON 325 (65 Fe) MG tablet Take 325 mg by mouth daily at 6 PM.     ELIQUIS 5 MG TABS tablet TAKE 1 TABLET BY MOUTH TWICE A DAY (Patient taking differently: Take 5 mg by mouth 2 (two) times daily.) 60 tablet 5   isosorbide mononitrate (IMDUR) 30 MG 24 hr tablet Take 1 tablet (30 mg total) by mouth daily. 90 tablet 3   levothyroxine (SYNTHROID, LEVOTHROID) 125 MCG tablet Take 125 mcg by mouth daily before breakfast.     metoprolol succinate (TOPROL-XL) 100 MG 24 hr tablet Take 1 tablet (100 mg total) by mouth daily. Take with or immediately following a meal. 90 tablet 3   omeprazole (PRILOSEC) 20 MG capsule TAKE 1 CAPSULE BY MOUTH EVERY DAY (Patient taking differently: Take 20 mg by mouth daily before breakfast.) 90 capsule 1   Oxymetazoline HCl (SINEX ULTRA FINE MIST 12-HOUR NA) Place 1 spray into both nostrils every 12 (twelve) hours as needed (for congestion).     pravastatin (PRAVACHOL) 80 MG tablet Take 80 mg by mouth in the morning.     traZODone (DESYREL) 50 MG tablet Take 50 mg by mouth daily.     VISINE 0.025-0.3 % ophthalmic solution Place 1 drop into both eyes 2 (two) times daily as needed for eye irritation.     vitamin B-12 (CYANOCOBALAMIN) 1000 MCG tablet Take 1,000  mcg by mouth daily.     hydrALAZINE (APRESOLINE) 10 MG tablet Take 10 mg by mouth 3 (three) times daily. (Patient not taking: Reported on 10/08/2023)      Musculoskeletal: Strength & Muscle Tone: decreased Gait & Station:  Did not assess Patient leans: N/A   Psychiatric Specialty Exam: Presentation  General Appearance:  Appropriate for Environment  Eye Contact: Fair  Speech: Clear and Coherent; Normal Rate  Speech Volume: Normal  Handedness: Right   Mood and Affect  Mood: Euthymic  Affect: Appropriate   Thought Process  Thought Processes: Other (comment) (linear and goal directed to intermittently circumstantial)  Descriptions of Associations:Intact  Orientation:Full (Time, Place and Person)  Thought Content:Logical  History of Schizophrenia/Schizoaffective disorder:No data recorded Duration of Psychotic Symptoms:No data recorded Hallucinations:Hallucinations: None (None currently)  Ideas of Reference:None  Suicidal Thoughts:Suicidal Thoughts: No  Homicidal Thoughts:Homicidal Thoughts: No   Sensorium  Memory: Immediate Fair; Recent Fair; Remote Fair  Judgment: Intact  Insight: Present   Executive Functions  Concentration: Fair  Attention Span: Fair  Recall: [employer] of Knowledge: Fair  Language: Fair   Psychomotor Activity  Psychomotor Activity: Psychomotor Activity: Normal   Assets  Assets: Communication Skills; Desire for Improvement; Financial Resources/Insurance; Housing; Intimacy; Leisure Time; Resilience; Social Support; Talents/Skills; Transportation; Vocational/Educational    Sleep  Sleep: Sleep: Fair   Physical Exam: Physical Exam Vitals and nursing note reviewed.  Constitutional:      General: She is not in acute distress.    Appearance: She is obese. She is not ill-appearing, toxic-appearing or diaphoretic.  Pulmonary:     Effort: Pulmonary effort is normal.  Skin:    General: Skin is warm and  dry.  Neurological:     Mental Status: She is alert and oriented to person, place, and time.  Psychiatric:  Attention and Perception: Attention and perception normal. She is attentive. She does not perceive auditory or visual hallucinations.        Mood and Affect: Mood and affect normal.        Speech: Speech normal.        Behavior: Behavior normal. Behavior is not agitated, slowed, aggressive, withdrawn, hyperactive or combative. Behavior is cooperative.        Thought Content: Thought content is not paranoid or delusional. Thought content does not include homicidal or suicidal ideation.        Cognition and Memory: Cognition and memory normal.        Judgment: Judgment normal.    Review of Systems  Psychiatric/Behavioral:  Negative for depression, hallucinations, substance abuse and suicidal ideas. The patient is not nervous/anxious and does not have insomnia.   All other systems reviewed and are negative.  Blood pressure (!) 112/101, pulse (!) 122, temperature (!) 96.1 F (35.6 C), temperature source Axillary, resp. rate (!) 29, SpO2 99%. There is no height or weight on file to calculate BMI.  Medical Decision Making:  Patient presented this encounter by way of family due to concerns for distressing hallucinations at the strong recommendation of the patient's outpatient cardiologist and primary care provider.   Upon evaluation, patient presents with no symptomology giving clinical suspicion for delirium or dementia utilizing formal and informal testing. From information obtained from collateral i.e. the patient's daughter, as well as evaluation performed by this provider on the patient, and laboratory studies appreciated thus far this encounter as a part of the patient's medical workup, etiologically it appears that the patient is very likely having hallucinations in the context of metabolic encephalopathy, and or some other medical etiology, versus a primary psychiatric condition.    To support this clinical suspicion, from collateral obtained, patient's family reports a severe development of auditory and visual hallucinations after the patient's Lasix was changed from 20 mg p.o. twice daily to 40 mg p.o. twice daily, as well as upon laboratory studies obtained this encounter, patient's ammonia level was appreciably 80 upon evaluation (hyperammoniemia), and since the introduction of lactulose and reevaluation of patient's ammonia level now showing within normal limits (appreciable 12 today, 10/09/23), patient appreciably presents with no endorsements of hallucinations at this time.  Given the investigation conducted, will psychiatrically clear the patient, as well as give the additional recommendations listed below to avoid delirium.  Discussed extensively plan of care and recommendations with Dr. Viviano Simas who is in agreement.  Recommendations-psychiatrically cleared  #Metabolic encephalopathy #Hallucinations  -Recommend avoid pharmacological agents that can induce delirium -Recommend continue trazodone 100 mg p.o. nightly -Recommend delirium Interventions for Nursing and Staff: ->RN to open blinds every AM. ->To Bedside: Glasses, hearing aide, and pt's own shoes. Make available to patients. when possible and encourage use. ->Encourage po fluids when appropriate, keep fluids within reach. ->OOB to chair with meals. ->Passive ROM exercises to all extremities with AM & PM care. ->RN to assess orientation to person, time and place QAM and PRN. ->Recommend extended visitation hours with familiar family/friends as feasible. ->Staff to minimize disturbances at night. Turn off television when pt asleep or when not in use.  Disposition: Defer to medical team  Lenox Ponds, NP 10/09/2023 2:31 PM

## 2023-10-09 NOTE — Assessment & Plan Note (Signed)
-  will monitor on tele avoid QT prolonging medications, rehydrate correct electrolytes ? ?

## 2023-10-09 NOTE — Evaluation (Signed)
Physical Therapy Evaluation Patient Details Name: [name] MRN: [id number] DOB: January 25, 1946 Today's Date: 10/09/2023  History of Present Illness  77 y.o. female presented to [facility] 11/6 for worsening BLE edema, cough, and AMS with hallucinations. Pt found to have increased ammonia level. PMH: chronic diastolic CHF, pericardial effusion status post pericardiocentesis, persistent A fib, CKD.   Clinical Impression  Pt in bed upon arrival of PT, agreeable to evaluation at this time. Prior to admission the pt was independent with all mobility, reports no falls, still mowing yard with riding mower. The pt presents with improved orientation at this time, was making jokes with therapists, but was incontinent of bowel with little awareness or concern. The pt required minA with cues for technique to complete bed mobility and sit-stand. Ambulation was limited to a few feet in the room with HHA due to onset of incontinence and pt fatigue. Given poor awareness and limited ability of spouse to assist, recommend continued post-acute rehab <3hours/day to facilitate return to independence and safe return home.      If plan is discharge home, recommend the following: A little help with walking and/or transfers;A little help with bathing/dressing/bathroom;Assistance with cooking/housework;Direct supervision/assist for medications management;Direct supervision/assist for financial management;Assist for transportation;Help with stairs or ramp for entrance   Can travel by private vehicle   No    Equipment Recommendations Rolling walker (2 wheels)  Recommendations for Other Services       Functional Status Assessment Patient has had a recent decline in their functional status and demonstrates the ability to make significant improvements in function in a reasonable and predictable amount of time.     Precautions / Restrictions Precautions Precautions: Fall Precaution Comments: in afib Restrictions Weight  Bearing Restrictions: No      Mobility  Bed Mobility Overal bed mobility: Needs Assistance Bed Mobility: Supine to Sit, Sit to Supine     Supine to sit: Min assist, HOB elevated Sit to supine: Supervision   General bed mobility comments: minA to complete with HHA    Transfers Overall transfer level: Needs assistance Equipment used: 1 person hand held assist, 2 person hand held assist Transfers: Sit to/from Stand Sit to Stand: Contact guard assist           General transfer comment: STSx2 from EOB, HHA +2 for safety    Ambulation/Gait Ambulation/Gait assistance: Min assist Gait Distance (Feet): 5 Feet Assistive device: 1 person hand held assist Gait Pattern/deviations: Step-through pattern, Decreased stride length Gait velocity: decreased Gait velocity interpretation: <1.31 ft/sec, indicative of household ambulator   General Gait Details: small steps with HHA to steady. limited by bowel incontinence and fatigue      Balance Overall balance assessment: Needs assistance Sitting-balance support: No upper extremity supported Sitting balance-Leahy Scale: Fair Sitting balance - Comments: sitting EOB (ED stretcher)   Standing balance support: Single extremity supported Standing balance-Leahy Scale: Poor Standing balance comment: Pt requiring UE support during static standing to feel steady/stable                             Pertinent Vitals/Pain Pain Assessment Pain Assessment: 0-10 Pain Score: 3  Pain Location: bottom "raw" Pain Descriptors / Indicators: Discomfort, Grimacing Pain Intervention(s): Limited activity within patient's tolerance, Monitored during session, Repositioned    Home Living Family/patient expects to be discharged to:: Private residence Living Arrangements: Spouse/significant other Available Help at Discharge: Family;Available 24 hours/day Type of Home: House Home Access: Stairs  to enter;Ramped entrance       Home Layout:  Two level;Able to live on main level with bedroom/bathroom Home Equipment: Grab bars - tub/shower;Shower seat - built in;Rollator (4 wheels);BSC/3in1;Cane - single point;Other (comment);Wheelchair - manual (adjustable bed) Additional Comments: denies any falls in the last year    Prior Function Prior Level of Function : Needs assist             Mobility Comments: modified independent with rollator ADLs Comments: ind with ADLs. husband cooks and cleans     Extremity/Trunk Assessment   Upper Extremity Assessment Upper Extremity Assessment: Defer to OT evaluation    Lower Extremity Assessment Lower Extremity Assessment: Generalized weakness (poor power in LE)    Cervical / Trunk Assessment Cervical / Trunk Assessment: Normal  Communication   Communication Communication: Hearing impairment Cueing Techniques: Verbal cues  Cognition Arousal: Alert Behavior During Therapy: WFL for tasks assessed/performed, Impulsive Overall Cognitive Status: Impaired/Different from baseline Area of Impairment: Orientation, Attention, Memory, Following commands, Safety/judgement, Awareness                 Orientation Level: Situation, Disoriented to Current Attention Level: Sustained Memory: Decreased short-term memory Following Commands: Follows one step commands consistently, Follows one step commands with increased time Safety/Judgement: Decreased awareness of safety, Decreased awareness of deficits Awareness: Intellectual   General Comments: alert  to self, knows she is in the hospital, knows current year        General Comments General comments (skin integrity, edema, etc.): HR in constant afib, up to 157 max but fluctuating 106-130s normally during session. pt incontinent of BM and not aware    Exercises     Assessment/Plan    PT Assessment Patient needs continued PT services  PT Problem List Decreased strength;Decreased range of motion;Decreased activity  tolerance;Decreased balance;Decreased mobility       PT Treatment Interventions DME instruction;Gait training;Stair training;Functional mobility training;Therapeutic activities;Therapeutic exercise;Balance training    PT Goals (Current goals can be found in the Care Plan section)  Acute Rehab PT Goals Patient Stated Goal: to return to independence PT Goal Formulation: With patient/family Time For Goal Achievement: 10/23/23 Potential to Achieve Goals: Good    Frequency Min 1X/week     Co-evaluation   Reason for Co-Treatment: Complexity of the patient's impairments (multi-system involvement);Necessary to address cognition/behavior during functional activity PT goals addressed during session: Mobility/safety with mobility;Balance OT goals addressed during session: ADL's and self-care       AM-PAC PT "6 Clicks" Mobility  Outcome Measure Help needed turning from your back to your side while in a flat bed without using bedrails?: A Little Help needed moving from lying on your back to sitting on the side of a flat bed without using bedrails?: A Little Help needed moving to and from a bed to a chair (including a wheelchair)?: A Little Help needed standing up from a chair using your arms (e.g., wheelchair or bedside chair)?: A Little Help needed to walk in hospital room?: Total (< 20 ft) Help needed climbing 3-5 steps with a railing? : A Lot 6 Click Score: 15    End of Session Equipment Utilized During Treatment: Gait belt Activity Tolerance: Patient tolerated treatment well;Patient limited by fatigue Patient left: with call bell/phone within reach;in chair (on [facility]) Nurse Communication: Mobility status PT Visit Diagnosis: Unsteadiness on feet (R26.81);Other abnormalities of gait and mobility (R26.89);Muscle weakness (generalized) (M62.81);Pain    Time: 1032-1100 PT Time Calculation (min) (ACUTE ONLY): 28 min   Charges:  PT Evaluation $PT Eval Moderate Complexity: 1 Mod   PT  General Charges $$ ACUTE PT VISIT: 1 Visit         Vickki Muff, PT, DPT   Acute Rehabilitation Department Office [phone] Secure Chat Communication Preferred  Ronnie Derby 10/09/2023, 1:33 PM

## 2023-10-09 NOTE — Assessment & Plan Note (Addendum)
Unclear etiology can sometimes been seen with loop diuretics Was treated with lactulose with improvement  Will obtain RUQ [name] to eval for liver disease Pt was given a dose of lactulose  Will recheck in AM For now can cont lactulose And work up causes

## 2023-10-09 NOTE — Evaluation (Signed)
Occupational Therapy Evaluation Patient Details Name: [name] MRN: [id number] DOB: 1946-03-20 Today's Date: 10/09/2023   History of Present Illness 77 y.o. female presented to [facility] 11/6 for worsening BLE edema, cough, and AMS with hallucinations. Pt found to have increased ammonia level. PMH: chronic diastolic CHF, pericardial effusion status post pericardiocentesis, persistent A fib, CKD.   Clinical Impression   Pt admitted for above, she is A&Ox3 but has decreased awareness of safety. Pt needing Max A to setup assist for ADLs, per her daughter's report the pt is requiring more frequent supervision that the husband cannot provide independently. Pt completing STS and pivots with CGA + HHA, requires some form of external support to feel steady and uses her rollator at baseline. Pt unaware that she was laying in her stool, reports feeling raw on her bottom, RN notified. Pt demonstrating need for consistent supervision. Pt would benefit from continued acute skilled OT services to address deficits and help transition to next level of care. Patient would benefit from post acute skilled rehab facility with <3 hours of therapy and 24/7 support, could consider something more long term such as memory care or ALF if cognition does not continue improving over time.       If plan is discharge home, recommend the following: A little help with walking and/or transfers;A little help with bathing/dressing/bathroom;Assistance with cooking/housework;Direct supervision/assist for financial management;Supervision due to cognitive status;Direct supervision/assist for medications management    Functional Status Assessment  Patient has had a recent decline in their functional status and demonstrates the ability to make significant improvements in function in a reasonable and predictable amount of time.  Equipment Recommendations  Other (comment) (defer)    Recommendations for Other Services       Precautions  / Restrictions Precautions Precautions: Fall Precaution Comments: in afib Restrictions Weight Bearing Restrictions: No      Mobility Bed Mobility Overal bed mobility: Needs Assistance Bed Mobility: Supine to Sit     Supine to sit: Min assist, HOB elevated          Transfers Overall transfer level: Needs assistance Equipment used: 1 person hand held assist, 2 person hand held assist Transfers: Sit to/from Stand Sit to Stand: Contact guard assist           General transfer comment: STSx2 from EOB, HHA +2 for safety      Balance Overall balance assessment: Needs assistance Sitting-balance support: No upper extremity supported Sitting balance-Leahy Scale: Fair Sitting balance - Comments: sitting EOB (ED stretcher)   Standing balance support: Single extremity supported Standing balance-Leahy Scale: Poor Standing balance comment: Pt requiring UE support during static standing to feel steady/stable                           ADL either performed or assessed with clinical judgement   ADL Overall ADL's : Needs assistance/impaired Eating/Feeding: Supervision/ safety;Bed level   Grooming: Sitting;Set up;Supervision/safety   Upper Body Bathing: Sitting;Supervision/ safety;Set up   Lower Body Bathing: Sitting/lateral leans;Minimal assistance   Upper Body Dressing : Sitting;Minimal assistance Upper Body Dressing Details (indicate cue type and reason): don gown, cues for sequence Lower Body Dressing: Sitting/lateral leans;Maximal assistance Lower Body Dressing Details (indicate cue type and reason): wears slip on shoes at baseline. pt verablized her typcially strategy for donning pants and underwear, also reports using reachers Toilet Transfer: BSC/3in1;Contact guard assist (step pivot) Toilet Transfer Details (indicate cue type and reason): bilat HHA for stedy  assist Toileting- Clothing Manipulation and Hygiene: Sit to/from stand;Maximal assistance Toileting -  Clothing Manipulation Details (indicate cue type and reason): rear pericare, pt can wipe front. Pt unaware she was soiled in her own stool       General ADL Comments: Pt daughter and husband present during session     Vision         Perception         Praxis         Pertinent Vitals/Pain Pain Assessment Pain Assessment: 0-10 Pain Score: 3  Pain Location: bottom "raw" Pain Descriptors / Indicators: Discomfort, Grimacing Pain Intervention(s): Limited activity within patient's tolerance, Monitored during session, Repositioned     Extremity/Trunk Assessment Upper Extremity Assessment Upper Extremity Assessment: Overall WFL for tasks assessed;Difficult to assess due to impaired cognition (arms initially flopped during shoulder MMT, 2nd attempt was much better 4/5 bilat shoulder flexion)   Lower Extremity Assessment Lower Extremity Assessment: Generalized weakness (poor power)   Cervical / Trunk Assessment Cervical / Trunk Assessment: Normal   Communication Communication Communication: Hearing impairment Cueing Techniques: Verbal cues   Cognition Arousal: Alert Behavior During Therapy: WFL for tasks assessed/performed, Impulsive Overall Cognitive Status: Impaired/Different from baseline Area of Impairment: Orientation, Attention, Memory, Following commands, Safety/judgement, Awareness                 Orientation Level: Situation, Disoriented to Current Attention Level: Sustained Memory: Decreased short-term memory Following Commands: Follows one step commands consistently, Follows one step commands with increased time Safety/Judgement: Decreased awareness of safety, Decreased awareness of deficits Awareness: Intellectual   General Comments: alert  to self, knows she is in the hospital, knows current year     General Comments  HR in constant afib, up to 157 max but fluctuating 106-130s normally during session    Exercises     Shoulder Instructions       Home Living Family/patient expects to be discharged to:: Private residence Living Arrangements: Spouse/significant other Available Help at Discharge: Family;Available PRN/intermittently Type of Home: House Home Access: Stairs to enter;Ramped entrance     Home Layout: Two level;Able to live on main level with bedroom/bathroom     Bathroom Shower/Tub: Producer, television/film/video: Handicapped height Bathroom Accessibility: Yes   Home Equipment: Grab bars - tub/shower;Shower seat - built in;Rollator (4 wheels);BSC/3in1;Cane - single point;Other (comment);Wheelchair - manual (adjustable bed)   Additional Comments: denies any falls in the last year. Husband may not be able to give needed 24/7 supervision at this time.      Prior Functioning/Environment Prior Level of Function : Independent/Modified Independent             Mobility Comments: modified independent with rollator ADLs Comments: ind with ADLs. husband cooks and cleans        OT Problem List: Decreased strength;Impaired balance (sitting and/or standing);Decreased cognition      OT Treatment/Interventions: Self-care/ADL training;Therapeutic exercise;Therapeutic activities;Patient/family education;Balance training    OT Goals(Current goals can be found in the care plan section) Acute Rehab OT Goals Patient Stated Goal: To get better; go home (husband wishes) OT Goal Formulation: With family Time For Goal Achievement: 10/23/23 Potential to Achieve Goals: Fair ADL Goals Pt Will Perform Grooming: (P) standing;with supervision Pt Will Perform Lower Body Dressing: (P) with supervision;sit to/from stand Pt Will Transfer to Toilet: (P) ambulating;with supervision Pt Will Perform Toileting - Clothing Manipulation and hygiene: (P) sit to/from stand;with supervision Additional ADL Goal #1: (P) Pt will notify staff 2/2 times throughout  the day when needing to use [facility] Additional ADL Goal #2: (P) pt will  appropriately  OT Frequency: Min 1X/week    Co-evaluation PT/OT/SLP Co-Evaluation/Treatment: Yes Reason for Co-Treatment: Complexity of the patient's impairments (multi-system involvement);Necessary to address cognition/behavior during functional activity PT goals addressed during session: Mobility/safety with mobility;Balance OT goals addressed during session: ADL's and self-care      AM-PAC OT "6 Clicks" Daily Activity     Outcome Measure Help from another person eating meals?: A Little Help from another person taking care of personal grooming?: A Little Help from another person toileting, which includes using toliet, bedpan, or urinal?: A Lot Help from another person bathing (including washing, rinsing, drying)?: A Little Help from another person to put on and taking off regular upper body clothing?: A Little Help from another person to put on and taking off regular lower body clothing?: A Lot 6 Click Score: 16   End of Session Equipment Utilized During Treatment: Gait belt Nurse Communication: Mobility status (+1 HHA)  Activity Tolerance: Patient tolerated treatment well Patient left: with call bell/phone within reach;with nursing/sitter in room;Other (comment) (Pt on BSC with family at beside. Educated on call bell use when needing to get off BSC)  OT Visit Diagnosis: Unsteadiness on feet (R26.81);Other abnormalities of gait and mobility (R26.89);Other symptoms and signs involving cognitive function;Muscle weakness (generalized) (M62.81)                Time: 1308-6578 OT Time Calculation (min): 35 min Charges:  OT General Charges $OT Visit: 1 Visit OT Evaluation $OT Eval Moderate Complexity: 1 Mod  10/09/2023  AB, OTR/L  Acute Rehabilitation Services  Office: [phone]   Tristan Schroeder 10/09/2023, 11:52 AM

## 2023-10-09 NOTE — Assessment & Plan Note (Addendum)
Appears to be fluid up. Started on diuresis with good improvement.   follow renal status patient has had recent echogram Would benefit from cardiology consult  Noted elevated ammonia rarely can be seen with loop diuretic use

## 2023-10-09 NOTE — Assessment & Plan Note (Signed)
Unclear etiology patient has complex visual and auditory hallucinations At first they thought was that they were secondary to medications or sleep deprivation but it seems like that they are not improving despite medication changes She is frightened and family cannot leave her at home alone family was sent by cardiology and PCP to the hospital to have patient's hallucinations worked up Patient have had prior workup in the past For completion we will order psychiatry evaluation Includes dose of trazodone for tonight 100 For completion obtain MRI brain If abnormal new neurology consult Discussed with patient and family that dementia can sometimes present with complex hallucinations Patient may benefit from placement due to safety

## 2023-10-09 NOTE — Assessment & Plan Note (Signed)
Continue Imdur and Toprol 100 mg daily

## 2023-10-10 ENCOUNTER — Ambulatory Visit: Payer: Medicare HMO | Admitting: Cardiology

## 2023-10-10 ENCOUNTER — Other Ambulatory Visit (HOSPITAL_COMMUNITY): Payer: Self-pay

## 2023-10-10 DIAGNOSIS — I5020 Unspecified systolic (congestive) heart failure: Secondary | ICD-10-CM | POA: Diagnosis not present

## 2023-10-10 DIAGNOSIS — I509 Heart failure, unspecified: Secondary | ICD-10-CM | POA: Diagnosis not present

## 2023-10-10 DIAGNOSIS — R443 Hallucinations, unspecified: Secondary | ICD-10-CM | POA: Diagnosis not present

## 2023-10-10 DIAGNOSIS — I5023 Acute on chronic systolic (congestive) heart failure: Secondary | ICD-10-CM | POA: Diagnosis not present

## 2023-10-10 DIAGNOSIS — I4821 Permanent atrial fibrillation: Secondary | ICD-10-CM | POA: Diagnosis not present

## 2023-10-10 LAB — BASIC METABOLIC PANEL
Anion gap: 15 (ref 5–15)
BUN: 22 mg/dL (ref 8–23)
CO2: 27 mmol/L (ref 22–32)
Calcium: 8.8 mg/dL — ABNORMAL LOW (ref 8.9–10.3)
Chloride: 98 mmol/L (ref 98–111)
Creatinine, Ser: 1.48 mg/dL — ABNORMAL HIGH (ref 0.44–1.00)
GFR, Estimated: 36 mL/min — ABNORMAL LOW (ref >60)
Glucose, Bld: 100 mg/dL — ABNORMAL HIGH (ref 70–99)
Potassium: 3.4 mmol/L — ABNORMAL LOW (ref 3.5–5.1)
Sodium: 140 mmol/L (ref 135–145)

## 2023-10-10 MED ORDER — LACTULOSE 10 GM/15ML PO SOLN
20.0000 g | Freq: Every day | ORAL | Status: DC
  Administered 2023-10-10 – 2023-10-11 (×2): 20 g via ORAL
  Filled 2023-10-10: qty 30

## 2023-10-10 MED ORDER — POTASSIUM CHLORIDE CRYS ER 20 MEQ PO TBCR
40.0000 meq | EXTENDED_RELEASE_TABLET | Freq: Two times a day (BID) | ORAL | Status: AC
  Administered 2023-10-10 (×2): 40 meq via ORAL
  Filled 2023-10-10 (×2): qty 2

## 2023-10-10 MED ORDER — ADULT MULTIVITAMIN W/MINERALS CH
1.0000 | ORAL_TABLET | Freq: Every day | ORAL | Status: DC
  Administered 2023-10-10 – 2023-10-14 (×5): 1 via ORAL
  Filled 2023-10-10 (×5): qty 1

## 2023-10-10 MED ORDER — ENSURE ENLIVE PO LIQD
237.0000 mL | Freq: Two times a day (BID) | ORAL | Status: DC
  Administered 2023-10-10 – 2023-10-13 (×4): 237 mL via ORAL

## 2023-10-10 NOTE — Progress Notes (Signed)
PROGRESS NOTE    [name]  WUJ:[id number] DOB: June 30, 1946 DOA: 10/08/2023 PCP: Creola Corn, MD  Chronically ill morbidly obese female with history of chronic systolic CHF, moderately reduced RV, persistent A-fib, history of pericardial effusion status post pericardiocentesis 2/22, CKD 3 A, hypertension, obesity, hypothyroidism presented to the ED with confusion and edema. -Reportedly was having hallucinations and confusion, Lasix was changed to Bumex. -PCP started her on trazodone after this suspecting insomnia induced delirium -Had worsening agitation and sundowning with cough for last few weeks, along with progressive dyspnea on exertion, in the ED mildly tachycardic at the [facility], labs with sodium 140, creatinine 1.6, BNP 1027, WBC 7.4, troponin 34, ammonia level 80 -Admitted, started on diuretics and lactulose  Subjective: Feels okay overall, breathing better, called family last night  Assessment and Plan:  Acute on chronic systolic CHF -Last echo 9/28 with a EF down to 30-35% with moderately reduced RV -Cardiology consulting -Continue IV Lasix 40 Mg twice daily, metoprolol, urine output inaccurate -Poor candidate for SGLT2i with encephalopathy delirium etc.  Metabolic encephalopathy, hallucination -Likely multifactorial, combination of elevated ammonia, delirium -elevated ammonia likely from RV failure, hepatic congestion -CT, MRI brain were unremarkable, UA benign -Could have untreated sleep apnea as well -Cut down lactulose, mental status improving, daughter insists on discontinuing trazodone -Increase activity, PT OT eval  Persistent atrial fibrillation Continue Toprol and Eliquis  CKD 3B -Stable, monitor with diuresis  Hypothyroidism Continue Synthroid, TSH is normal   DVT prophylaxis: Apixaban Code Status: Full code Family Communication: Daughter at bedside Disposition Plan: To be determined  Consultants:    Procedures:   Antimicrobials:     Objective: Vitals:   10/10/23 0156 10/10/23 0438 10/10/23 0740 10/10/23 0840  BP: 120/83 (!) 152/83 (!) 126/94 (!) 126/94  Pulse: 73 100 (!) 103 (!) 103  Resp: 18 19 17    Temp: (!) 97.3 F (36.3 C) (!) 97.3 F (36.3 C) 97.7 F (36.5 C)   TempSrc: Oral Oral Oral   SpO2: 96% 97% 97%   Weight:      Height:        Intake/Output Summary (Last 24 hours) at 10/10/2023 1113 Last data filed at 10/10/2023 9562 Gross per 24 hour  Intake 60 ml  Output 650 ml  Net -590 ml   Filed Weights   10/09/23 1627  Weight: 95.8 kg    Examination:  General exam: Obese chronically ill female sitting up in bed, AAOx3 HEENT: Neck obese unable to assess JVD CVS: S1-S2, regular rhythm Lungs: Decreased breath sounds to bases Abdomen: Soft, nontender, bowel sounds present Remedies: 1+ edema Skin: No rashes Psychiatry:  Mood & affect appropriate.     Data Reviewed:   CBC: Recent Labs  Lab 10/08/23 1415 10/09/23 0443  WBC 7.4 8.2  HGB 13.8 14.5  HCT 44.5 46.1*  MCV 84.9 85.2  PLT 274 168   Basic Metabolic Panel: Recent Labs  Lab 10/08/23 1415 10/08/23 1533 10/08/23 2142 10/09/23 0555 10/10/23 0338  NA 140  --   --  140 140  K 3.6  --   --  4.1 3.4*  CL 102  --   --  100 98  CO2 29  --   --  30 27  GLUCOSE 109*  --   --  112* 100*  BUN 24*  --   --  23 22  CREATININE 1.62*  --   --  1.55* 1.48*  CALCIUM 9.2  --   --  9.1 8.8*  MG  --  2.0  --  1.9  --   PHOS  --   --  3.5 3.8  --    GFR: Estimated Creatinine Clearance: 36.5 mL/min (A) (by C-G formula based on SCr of 1.48 mg/dL (H)). Liver Function Tests: Recent Labs  Lab 10/08/23 1415 10/09/23 0555  AST 20 28  ALT 15 16  ALKPHOS 42 39  BILITOT 0.7 1.1  PROT 6.3* 6.1*  ALBUMIN 3.4* 3.3*   No results for input(s): "LIPASE", "AMYLASE" in the last 168 hours. Recent Labs  Lab 10/08/23 1631 10/09/23 0800  AMMONIA 80* 12   Coagulation Profile: No results for input(s): "INR", "PROTIME" in the last 168  hours. Cardiac Enzymes: Recent Labs  Lab 10/08/23 2142  CKTOTAL 44   BNP (last 3 results) Recent Labs    09/24/23 1557  PROBNP 17,553*   HbA1C: No results for input(s): "HGBA1C" in the last 72 hours. CBG: Recent Labs  Lab 10/08/23 1556  GLUCAP 102*   Lipid Profile: No results for input(s): "CHOL", "HDL", "LDLCALC", "TRIG", "CHOLHDL", "LDLDIRECT" in the last 72 hours. Thyroid Function Tests: Recent Labs    10/08/23 1533  TSH 0.933   Anemia Panel: Recent Labs    10/09/23 0555  VITAMINB12 3,128*  FOLATE 16.8   Urine analysis:    Component Value Date/Time   COLORURINE STRAW (A) 10/08/2023 1454   APPEARANCEUR CLEAR 10/08/2023 1454   LABSPEC 1.008 10/08/2023 1454   PHURINE 6.0 10/08/2023 1454   GLUCOSEU NEGATIVE 10/08/2023 1454   HGBUR NEGATIVE 10/08/2023 1454   BILIRUBINUR NEGATIVE 10/08/2023 1454   KETONESUR NEGATIVE 10/08/2023 1454   PROTEINUR NEGATIVE 10/08/2023 1454   UROBILINOGEN 0.2 01/31/2011 1208   NITRITE NEGATIVE 10/08/2023 1454   LEUKOCYTESUR NEGATIVE 10/08/2023 1454   Sepsis Labs: @LABRCNTIP (procalcitonin:4,lacticidven:4)  )No results found for this or any previous visit (from the past 240 hour(s)).   Radiology Studies: US Abdomen Limited RUQ (LIVER/GB)  Result Date: 10/09/2023 CLINICAL DATA:  Increased ammonia level EXAM: ULTRASOUND ABDOMEN LIMITED RIGHT UPPER QUADRANT COMPARISON:  None Available. FINDINGS: Gallbladder: Multiple stones within the gallbladder measuring up 21.1 cm. No wall thickening or sonographic Murphy sign. Common bile duct: Diameter: Normal caliber, 3 mm Liver: No focal lesion identified. Within normal limits in parenchymal echogenicity. Portal vein is patent on color Doppler imaging with normal direction of blood flow towards the liver. Other: Right pleural effusion noted IMPRESSION: Cholelithiasis.  No sonographic evidence of acute cholecystitis. Right pleural effusion. Electronically Signed   By: Charlett Nose M.D.   On:  10/09/2023 00:33   MR BRAIN WO CONTRAST  Result Date: 10/08/2023 CLINICAL DATA:  Delirium EXAM: MRI HEAD WITHOUT CONTRAST TECHNIQUE: Multiplanar, multiecho pulse sequences of the brain and surrounding structures were obtained without intravenous contrast. COMPARISON:  None Available. FINDINGS: Brain: No acute infarct, mass effect or extra-axial collection. No chronic microhemorrhage or siderosis. There is multifocal hyperintense T2-weighted signal within the white matter. Generalized volume loss. The midline structures are normal. Vascular: Normal flow voids Skull and upper cervical spine: Normal marrow signal Sinuses/Orbits: Right mastoid effusion. Paranasal sinuses are clear. Normal orbits. Other: None IMPRESSION: 1. No acute intracranial abnormality. 2. Chronic small vessel ischemia and volume loss. Electronically Signed   By: Deatra Robinson M.D.   On: 10/08/2023 23:50   CT HEAD WO CONTRAST  Result Date: 10/08/2023 CLINICAL DATA:  Altered level of consciousness, confusion, hallucinations EXAM: CT HEAD WITHOUT CONTRAST TECHNIQUE: Contiguous axial images were obtained from the base of the skull through the  vertex without intravenous contrast. RADIATION DOSE REDUCTION: This exam was performed according to the departmental dose-optimization program which includes automated exposure control, adjustment of the mA and/or kV according to patient size and/or use of iterative reconstruction technique. COMPARISON:  09/27/2023 FINDINGS: Brain: No evidence of acute infarct or hemorrhage. Lateral ventricles and midline structures are stable. No acute extra-axial fluid collections. No mass effect. Vascular: No hyperdense vessel or unexpected calcification. Skull: Normal. Negative for fracture or focal lesion. Sinuses/Orbits: No acute finding.  Stable right mastoid effusion. Other: None. IMPRESSION: 1. Stable head CT, no acute intracranial process. Electronically Signed   By: Sharlet Salina M.D.   On: 10/08/2023 18:20    DG Chest 2 View  Result Date: 10/08/2023 CLINICAL DATA:  Shortness of breath. EXAM: CHEST - 2 VIEW COMPARISON:  September 26, 2023. FINDINGS: Stable cardiomediastinal silhouette. Stable reticular densities are noted throughout both lungs which may represent scarring, but acute superimposed edema or atypical inflammation cannot be excluded. Bony thorax is unremarkable. IMPRESSION: Stable reticular densities are noted throughout both lungs which may represent scarring, but acute superimposed edema or atypical inflammation cannot be excluded. Aortic Atherosclerosis (ICD10-I70.0). Electronically Signed   By: Lupita Raider M.D.   On: 10/08/2023 16:55     Scheduled Meds:  apixaban  5 mg Oral BID   digoxin  0.125 mg Oral Daily   furosemide  60 mg Intravenous BID   guaiFENesin  600 mg Oral BID   isosorbide mononitrate  30 mg Oral Daily   lactulose  20 g Oral Daily   levothyroxine  125 mcg Oral QAC breakfast   metoprolol succinate  100 mg Oral Daily   pravastatin  80 mg Oral Daily   Continuous Infusions:     LOS: 1 day    Time spent:    Zannie Cove, MD [facility]   10/10/2023, 11:13 AM

## 2023-10-10 NOTE — Progress Notes (Signed)
Initial Nutrition Assessment  DOCUMENTATION CODES:   Non-severe (moderate) malnutrition in context of chronic illness  INTERVENTION:  Encouraged adequate PO intake Liberalize diet to 2 gram sodium  Ensure Enlive po BID, each supplement provides 350 kcal and 20 grams of protein. MVI with minerals daily  NUTRITION DIAGNOSIS:   Moderate Malnutrition related to chronic illness (CHF) as evidenced by edema, mild fat depletion, moderate muscle depletion.  GOAL:   Patient will meet greater than or equal to 90% of their needs   MONITOR:   PO intake, Labs, Weight trends, Supplement acceptance  REASON FOR ASSESSMENT:   Consult Assessment of nutrition requirement/status  ASSESSMENT:   Pt admitted with AMS, visual and auditory hallucinations, and worsening bilateral edema. PMH significant for systolic CHF, severe pulmonary HTN, hyponatremia, afib on eliquis, CKD 3b, goiter, HLD, HTN, hypothyroidism  Metabolic encephalopathy and hallucinations likely multifactorial, combination of hepatic encephalopathy/elevated ammonia and  delirium.   Pt sitting up in bedside recliner, dozing off during visit but able to stay awake long enough to obtain nutrition related history.   Pt states that she usually eats a light breakfast consisting of cereal or toast with jelly. She and her husband don't always eat lunch consistently but when they do they may have a peanut butter sandwich. Her husband prepares dinner for them which includes a variety of foods.   She denies significant changes to her appetite or PO intake. She has been taking Vitamin B12 and calcium but was recommended to decrease intake of B12 d/t elevated lab work.   Meal completions: 11/8: 20% breakfast, 100% lunch  Pt states that her weight on admission was 211 lb and mentions that when she was weighed on standing scale yesterday it was 204 lbs. Unfortunately, cannot confirm this weight loss based on documentation. Weight on admission  noted to be 211 lbs which is consistent with documented weight on 10/23. Weight with up and down fluctuations over the last 2 months which is likely r/t changes in status of edema. Presence of edema likely falsely elevating true dry weight.   Pt noted to have multiple BMs d/t lactulose and also taking lasix d/t BLE.   Medications: lasix 60mg  BID, lactulose BID  Labs:  potassium 3.4, Cr 1.48, GFR 36, ammonia 12 (WDL)  UOP: x24 hours + 2 unmeasured occurrences  NUTRITION - FOCUSED PHYSICAL EXAM: Although unable to assess BLE and pt report of po intake at baseline, mild-moderate muscle and fat deficits are suggestive of a time frame of under nutrition.  Flowsheet Row Most Recent Value  Orbital Region Mild depletion  Upper Arm Region Moderate depletion  Thoracic and Lumbar Region No depletion  Buccal Region No depletion  Temple Region No depletion  Clavicle Bone Region Moderate depletion  Clavicle and Acromion Bone Region Mild depletion  Scapular Bone Region No depletion  Dorsal Hand Moderate depletion  Patellar Region Unable to assess  Anterior Thigh Region Unable to assess  Posterior Calf Region Unable to assess  Edema (RD Assessment) Mild  [non-pitting BLE]  Hair Reviewed  Eyes Reviewed  Mouth Reviewed  Skin Reviewed  Nails Reviewed       Diet Order:   Diet Order             Diet 2 gram sodium Room service appropriate? Yes with Assist; Fluid consistency: Thin  Diet effective now                   EDUCATION NEEDS:   Education needs have  been addressed  Skin:  Skin Assessment: Reviewed RN Assessment  Last BM:  11/8 loose stools, on lactulose  Height:   Ht Readings from Last 1 Encounters:  10/09/23 5\' 6"  (1.676 m)    Weight:   Wt Readings from Last 1 Encounters:  10/09/23 95.8 kg   BMI:  Body mass index is 34.09 kg/m.  Estimated Nutritional Needs:   Kcal:  1500-1700  Protein:  75-90g  Fluid:  >/=1.5L  Drusilla Kanner, RDN, LDN Clinical  Nutrition

## 2023-10-10 NOTE — TOC Benefit Eligibility Note (Signed)
Patient Product/process development scientist completed.    The patient is insured through [employer]. Patient has Medicare and is not eligible for a copay card, but may be able to apply for patient assistance, if available.    Ran test claim for Farxiga 10 mg and the current 30 day co-pay is $143.16.  Ran test claim for Jardiance 10 mg and the current 30 day co-pay is $150.25.  This test claim was processed through [facility]- copay amounts may vary at other pharmacies due to pharmacy/plan contracts, or as the patient moves through the different stages of their insurance plan.     Roland Earl, CPHT Pharmacy Technician III Certified Patient Advocate [facility] Patient Advocate Team Direct Number: [phone]  Fax: [phone]

## 2023-10-10 NOTE — [place] FL2 (Signed)
[place] MEDICAID FL2 LEVEL OF CARE FORM     IDENTIFICATION  Patient Name: [name] Birthdate: July 30, 1946 Sex: female Admission Date (Current Location): 10/08/2023  [facility] and [place] Number:  Producer, television/film/video and Address:  The . [facility], [address] [place], [place] [address]      Provider Number: [id number]  Attending Physician Name and Address:  Zannie Cove, MD  Relative Name and Phone Number:       Current Level of Care: Hospital Recommended Level of Care: Skilled Nursing Facility Prior Approval Number:    Date Approved/Denied:   PASRR Number: [id number]  Discharge Plan: SNF    Current Diagnoses: Patient Active Problem List   Diagnosis Date Noted   Hallucinations 10/09/2023   Increased ammonia level 10/09/2023   Metabolic encephalopathy 10/09/2023   Acute on chronic systolic CHF (congestive heart failure) (HCC) 10/08/2023   Leg swelling 09/10/2023   Class 1 obesity due to excess calories with body mass index (BMI) of 33.0 to 33.9 in adult 09/02/2023   Acute combined systolic and diastolic heart failure (HCC) 08/31/2023   Aortic valve stenosis 08/31/2023   Acute on chronic systolic heart failure (HCC) 08/31/2023   Fluid overload 08/28/2023   Hyponatremia 08/01/2023   History of CHF (congestive heart failure) 08/01/2023   Permanent atrial fibrillation (HCC) 08/01/2023   Hypothyroidism 08/01/2023   GERD (gastroesophageal reflux disease) 08/01/2023   Chronic heart failure with preserved ejection fraction (HCC) 05/10/2021   Nausea 02/01/2021   Pleural effusion    Pericardial effusion    CHF exacerbation (HCC) 01/22/2021   Left lower lobe pneumonia 01/22/2021   Acute respiratory failure with hypoxia (HCC) 01/22/2021   Anemia 01/22/2021   AKI (acute kidney injury) (HCC) 01/22/2021   CKD stage 3b, GFR 30-44 ml/min (HCC) 01/22/2021   Prolonged QT interval 01/22/2021   Acute on chronic diastolic heart failure (HCC)  53/66/4403   Leg edema 12/25/2020   Essential hypertension 12/24/2019   Persistent atrial fibrillation (HCC) 12/24/2019   Preop examination 12/24/2019   Total knee replacement status 05/29/2017    Orientation RESPIRATION BLADDER Height & Weight     Self, Time, Situation, Place  Normal   Weight: 211 lb 3.2 oz (95.8 kg) Height:  5\' 6"  (167.6 cm)  BEHAVIORAL SYMPTOMS/MOOD NEUROLOGICAL BOWEL NUTRITION STATUS      Continent    AMBULATORY STATUS COMMUNICATION OF NEEDS Skin   Limited Assist Verbally Normal                       Personal Care Assistance Level of Assistance  Bathing, Feeding, Dressing Bathing Assistance: Limited assistance Feeding assistance: Limited assistance Dressing Assistance: Limited assistance     Functional Limitations Info  Sight, Hearing, Speech Sight Info: Adequate Hearing Info: Adequate Speech Info: Adequate    SPECIAL CARE FACTORS FREQUENCY  PT (By licensed PT), OT (By licensed OT)                    Contractures Contractures Info: Not present    Additional Factors Info  Code Status Code Status Info: FULL CODE             Current Medications (10/10/2023):  This is the current hospital active medication list Current Facility-Administered Medications  Medication Dose Route Frequency Provider Last Rate Last Admin   acetaminophen (TYLENOL) tablet 650 mg  650 mg Oral Q6H PRN Therisa Doyne, MD       Or  acetaminophen (TYLENOL) suppository 650 mg  650 mg Rectal Q6H PRN Doutova, Anastassia, MD       albuterol (PROVENTIL) (2.5 MG/3ML) 0.083% nebulizer solution 2.5 mg  2.5 mg Nebulization Q2H PRN Doutova, Anastassia, MD       apixaban (ELIQUIS) tablet 5 mg  5 mg Oral BID Doutova, Anastassia, MD   5 mg at 10/10/23 0840   digoxin (LANOXIN) tablet 0.125 mg  0.125 mg Oral Daily Chilton Si, MD   0.125 mg at 10/10/23 0839   furosemide (LASIX) injection 60 mg  60 mg Intravenous BID Yvonna Alanis L, PA-C   60 mg at 10/10/23 0838    guaiFENesin (MUCINEX) 12 hr tablet 600 mg  600 mg Oral BID Therisa Doyne, MD   600 mg at 10/10/23 0839   isosorbide mononitrate (IMDUR) 24 hr tablet 30 mg  30 mg Oral Daily Doutova, Anastassia, MD   30 mg at 10/10/23 0840   lactulose (CHRONULAC) 10 GM/15ML solution 20 g  20 g Oral Daily Zannie Cove, MD   20 g at 10/10/23 0851   levothyroxine (SYNTHROID) tablet 125 mcg  125 mcg Oral QAC breakfast Therisa Doyne, MD   125 mcg at 10/10/23 0615   metoprolol succinate (TOPROL-XL) 24 hr tablet 100 mg  100 mg Oral Daily Doutova, Anastassia, MD   100 mg at 10/10/23 0840   potassium chloride SA (KLOR-CON M) CR tablet 40 mEq  40 mEq Oral BID Zannie Cove, MD       pravastatin (PRAVACHOL) tablet 80 mg  80 mg Oral Daily Therisa Doyne, MD   80 mg at 10/09/23 1657     Discharge Medications: Please see discharge summary for a list of discharge medications.  Relevant Imaging Results:  Relevant Lab Results:   Additional Information SS# [id number]  Deatra Robinson, [place]

## 2023-10-10 NOTE — Progress Notes (Signed)
Physical Therapy Treatment Patient Details Name: [name] MRN: [id number] DOB: Aug 11, 1946 Today's Date: 10/10/2023   History of Present Illness 77 y.o. female presented to [facility] 11/6 for worsening BLE edema, cough, and AMS with hallucinations. Pt found to have increased ammonia level. PMH: chronic diastolic CHF, pericardial effusion status post pericardiocentesis, persistent A fib, CKD.    PT Comments  Pt was able to make great functional gains this date. She was able to transfer to stand and ambulate down the hall utilizing a rollator without LOB at a CGA level. She was limited by sudden urgency to have a bowel movement and bowel incontinence while ambulating in the halls though. Pt reports she normally wears a pad at home but does not typically have this much difficulty controlling her bowels at baseline. She believes the current bowel incontinence is due to antibiotics. Her husband was present throughout the session and both he and the pt believe pt is close to her baseline for functional mobility and that he can safely manage her at d/c. Thus, updated d/c recs to HHPT. Will continue to follow acutely.    If plan is discharge home, recommend the following: A little help with walking and/or transfers;A little help with bathing/dressing/bathroom;Assistance with cooking/housework;Direct supervision/assist for medications management;Direct supervision/assist for financial management;Assist for transportation;Help with stairs or ramp for entrance   Can travel by private vehicle     Yes  Equipment Recommendations  None recommended by PT    Recommendations for Other Services       Precautions / Restrictions Precautions Precautions: Fall Precaution Comments: in afib; bowel incontinence Restrictions Weight Bearing Restrictions: No     Mobility  Bed Mobility               General bed mobility comments: Pt in recliner start and end of session    Transfers Overall transfer level:  Needs assistance Equipment used: Rollator (4 wheels) Transfers: Sit to/from Stand, Bed to chair/wheelchair/BSC Sit to Stand: Contact guard assist   Step pivot transfers: Contact guard assist       General transfer comment: Extra time and effort for pt to power up to stand from recliner, commode, and rollator, CGA for safety.    Ambulation/Gait Ambulation/Gait assistance: Contact guard assist Gait Distance (Feet): 120 Feet (x2 bouts of ~120 ft > ~5 ft) Assistive device: Rollator (4 wheels) Gait Pattern/deviations: Step-through pattern, Decreased stride length Gait velocity: decreased Gait velocity interpretation: <1.8 ft/sec, indicate of risk for recurrent falls   General Gait Details: Pt ambulated steadily but slowly with rollator. Pt had sudden urgency to have BM and unable to hold long enough to walk back to room and pt had bowel incontinence in the hall. No LOB, CGA for safety   Stairs             Wheelchair Mobility     Tilt Bed    Modified Rankin (Stroke Patients Only)       Balance Overall balance assessment: Needs assistance Sitting-balance support: No upper extremity supported Sitting balance-Leahy Scale: Fair     Standing balance support: Bilateral upper extremity supported, During functional activity, Reliant on assistive device for balance Standing balance-Leahy Scale: Poor Standing balance comment: Reliant on rollator                            Cognition Arousal: Alert Behavior During Therapy: WFL for tasks assessed/performed Overall Cognitive Status: Impaired/Different from baseline Area of Impairment: Attention, Memory,  Following commands, Safety/judgement, Awareness                   Current Attention Level: Sustained Memory: Decreased short-term memory Following Commands: Follows one step commands consistently, Follows one step commands with increased time Safety/Judgement: Decreased awareness of safety, Decreased  awareness of deficits Awareness: Emergent   General Comments: Pt aware she has been confused lately. Pt knew she needed to have a BM  but it was urgent at that point and she could not hold it but a few seconds.        Exercises      General Comments        Pertinent Vitals/Pain Pain Assessment Pain Assessment: Faces Faces Pain Scale: Hurts little more Pain Location: bottom "raw" Pain Descriptors / Indicators: Discomfort, Grimacing Pain Intervention(s): Limited activity within patient's tolerance, Monitored during session    Home Living                          Prior Function            PT Goals (current goals can now be found in the care plan section) Acute Rehab PT Goals Patient Stated Goal: to return to independence PT Goal Formulation: With patient/family Time For Goal Achievement: 10/23/23 Potential to Achieve Goals: Good Progress towards PT goals: Progressing toward goals    Frequency    Min 1X/week      PT Plan      Co-evaluation              AM-PAC PT "6 Clicks" Mobility   Outcome Measure  Help needed turning from your back to your side while in a flat bed without using bedrails?: A Little Help needed moving from lying on your back to sitting on the side of a flat bed without using bedrails?: A Little Help needed moving to and from a bed to a chair (including a wheelchair)?: A Little Help needed standing up from a chair using your arms (e.g., wheelchair or bedside chair)?: A Little Help needed to walk in hospital room?: A Little Help needed climbing 3-5 steps with a railing? : A Lot 6 Click Score: 17    End of Session Equipment Utilized During Treatment: Gait belt Activity Tolerance: Patient tolerated treatment well Patient left: with call bell/phone within reach;in chair;with chair alarm set   PT Visit Diagnosis: Unsteadiness on feet (R26.81);Other abnormalities of gait and mobility (R26.89);Muscle weakness (generalized)  (M62.81);Pain     Time: 1218-1300 PT Time Calculation (min) (ACUTE ONLY): 42 min  Charges:    $Gait Training: 8-22 mins $Therapeutic Activity: 23-37 mins PT General Charges $$ ACUTE PT VISIT: 1 Visit                     Virgil Benedict, PT, DPT Acute Rehabilitation Services  Office: [phone]    [name] 10/10/2023, 2:36 PM

## 2023-10-10 NOTE — Plan of Care (Signed)
  Problem: Education: Goal: Ability to demonstrate management of disease process will improve Outcome: Progressing Goal: Ability to verbalize understanding of medication therapies will improve Outcome: Progressing Goal: Individualized Educational Video(s) Outcome: Progressing   Problem: Activity: Goal: Capacity to carry out activities will improve Outcome: Progressing   Problem: Cardiac: Goal: Ability to achieve and maintain adequate cardiopulmonary perfusion will improve Outcome: Progressing   Problem: Education: Goal: Knowledge of General Education information will improve Description: Including pain rating scale, medication(s)/side effects and non-pharmacologic comfort measures Outcome: Progressing   Problem: Health Behavior/Discharge Planning: Goal: Ability to manage health-related needs will improve Outcome: Progressing   Problem: Clinical Measurements: Goal: Ability to maintain clinical measurements within normal limits will improve Outcome: Progressing Goal: Will remain free from infection Outcome: Progressing Goal: Diagnostic test results will improve Outcome: Progressing Goal: Respiratory complications will improve Outcome: Progressing Goal: Cardiovascular complication will be avoided Outcome: Progressing   Problem: Activity: Goal: Risk for activity intolerance will decrease Outcome: Progressing   Problem: Nutrition: Goal: Adequate nutrition will be maintained Outcome: Progressing   Problem: Coping: Goal: Level of anxiety will decrease Outcome: Progressing   Problem: Elimination: Goal: Will not experience complications related to bowel motility Outcome: Progressing Goal: Will not experience complications related to urinary retention Outcome: Progressing   Problem: Pain Management: Goal: General experience of comfort will improve Outcome: Progressing   Problem: Safety: Goal: Ability to remain free from injury will improve Outcome: Progressing    Problem: Skin Integrity: Goal: Risk for impaired skin integrity will decrease Outcome: Progressing

## 2023-10-10 NOTE — TOC Initial Note (Addendum)
Transition of Care [facility]) - Initial/Assessment Note    Patient Details  Name: [name] MRN: [id number] Date of Birth: [birth year]  [facility] CM/SW Contact:    Deatra Robinson, [place] Phone Number: 10/10/2023, 11:11 AM  Clinical Narrative: spoke to pt's dtr Marylene Land re PT recommendation for SNF. Pt admitted with delirium/confusion and hallucinations. Reviewed SNF placement process and answered questions. Pt's dtr reports she will discuss SNF with pt's family, there is a possibility they will decide to take pt home with [facility]. Pt's dtr agreeable to SW starting SNF search. Will f/u with offers as available and will need to confirm dc plan.   UPDATE 1430: PT updated rec to [facility]. SW signing off but available if SNF needed. CM aware of HH needs.   Dellie Burns, MSW, LCSW [phone] (coverage)            Expected Discharge Plan: Skilled Nursing Facility Barriers to Discharge: Continued Medical Work up, English as a second language teacher, SNF Pending bed offer   Patient Goals and CMS Choice     Choice offered to / list presented to : Adult Children [place] ownership interest in [facility].provided to:: Adult Children    Expected Discharge Plan and Services     Post Acute Care Choice: Skilled Nursing Facility, Home Health Living arrangements for the past 2 months: Single Family Home                                      Prior Living Arrangements/Services Living arrangements for the past 2 months: Single Family Home Lives with:: Spouse          Need for Family Participation in Patient Care: Yes (Comment) Care giver support system in place?: Yes (comment) Current home services: DME (rollator, BSC) Criminal Activity/Legal Involvement Pertinent to Current Situation/Hospitalization: No - Comment as needed  Activities of Daily Living   ADL Screening (condition at time of admission) Independently performs ADLs?: Yes (appropriate for developmental  age) Is the patient deaf or have difficulty hearing?: Yes Does the patient have difficulty seeing, even when wearing glasses/contacts?: Yes Does the patient have difficulty concentrating, remembering, or making decisions?: Yes  Permission Sought/Granted Permission sought to share information with : Facility Industrial/product designer granted to share information with : Yes, Verbal Permission Granted              Emotional Assessment       Orientation: : Fluctuating Orientation (Suspected and/or reported Sundowners), Oriented to Self, Oriented to Place, Oriented to  Time, Oriented to Situation Alcohol / Substance Use: Not Applicable Psych Involvement: No (comment)  Admission diagnosis:  Delirium [R41.0] Hyperammonemia (HCC) [E72.20] Acute on chronic systolic CHF (congestive heart failure) (HCC) [I50.23] Acute on chronic congestive heart failure, unspecified heart failure type (HCC) [I50.9] Patient Active Problem List   Diagnosis Date Noted   Hallucinations 10/09/2023   Increased ammonia level 10/09/2023   Metabolic encephalopathy 10/09/2023   Acute on chronic systolic CHF (congestive heart failure) (HCC) 10/08/2023   Leg swelling 09/10/2023   Class 1 obesity due to excess calories with body mass index (BMI) of 33.0 to 33.9 in adult 09/02/2023   Acute combined systolic and diastolic heart failure (HCC) 08/31/2023   Aortic valve stenosis 08/31/2023   Acute on chronic systolic heart failure (HCC) 08/31/2023   Fluid overload 08/28/2023   Hyponatremia 08/01/2023   History of CHF (congestive heart failure) 08/01/2023  Permanent atrial fibrillation (HCC) 08/01/2023   Hypothyroidism 08/01/2023   GERD (gastroesophageal reflux disease) 08/01/2023   Chronic heart failure with preserved ejection fraction (HCC) 05/10/2021   Nausea 02/01/2021   Pleural effusion    Pericardial effusion    CHF exacerbation (HCC) 01/22/2021   Left lower lobe pneumonia 01/22/2021   Acute  respiratory failure with hypoxia (HCC) 01/22/2021   Anemia 01/22/2021   AKI (acute kidney injury) (HCC) 01/22/2021   CKD stage 3b, GFR 30-44 ml/min (HCC) 01/22/2021   Prolonged QT interval 01/22/2021   Acute on chronic diastolic heart failure (HCC) 12/25/2020   Leg edema 12/25/2020   Essential hypertension 12/24/2019   Persistent atrial fibrillation (HCC) 12/24/2019   Preop examination 12/24/2019   Total knee replacement status 05/29/2017   PCP:  Creola Corn, MD Pharmacy:   [facility] [phone] - [place], [place] - [name] [place] [address] Phone: [phone] Fax: [phone]     Social Determinants of Health (SDOH) Social History: SDOH Screenings   Food Insecurity: No Food Insecurity (10/08/2023)  Housing: Patient Declined (10/08/2023)  Transportation Needs: No Transportation Needs (10/08/2023)  Utilities: Not At Risk (10/08/2023)  Tobacco Use: Low Risk  (10/09/2023)   SDOH Interventions:     Readmission Risk Interventions     No data to display

## 2023-10-10 NOTE — Progress Notes (Signed)
   Patient Name: [name] Date of Encounter: 10/10/2023 [facility] Cardiologist: Elder Negus, MD   Interval Summary  .    Feels okay Does not remember episodes of hallucinations herself, noticed by family members. Breathing well, leg swelling improving.  Vital Signs .    Vitals:   10/10/23 0438 10/10/23 0740 10/10/23 0840 10/10/23 1150  BP: (!) 152/83 (!) 126/94 (!) 126/94 126/87  Pulse: 100 (!) 103 (!) 103 100  Resp: 19 17  18   Temp: (!) 97.3 F (36.3 C) 97.7 F (36.5 C)  97.7 F (36.5 C)  TempSrc: Oral Oral  Oral  SpO2: 97% 97%  92%  Weight:      Height:        Intake/Output Summary (Last 24 hours) at 10/10/2023 1334 Last data filed at 10/10/2023 1256 Gross per 24 hour  Intake 295 ml  Output 650 ml  Net -355 ml      10/09/2023    4:27 PM 09/24/2023    3:39 PM 09/10/2023    2:32 PM  Last 3 Weights  Weight (lbs) 211 lb 3.2 oz 211 lb 3.2 oz 214 lb 12.8 oz  Weight (kg) 95.8 kg 95.8 kg 97.433 kg      Telemetry/ECG    10/10/2023 telemetry- Personally Reviewed A-fib with RVR 110-120  Physical Exam .   Physical Exam Vitals and nursing note reviewed.  Constitutional:      General: She is not in acute distress. Neck:     Vascular: No JVD.  Cardiovascular:     Rate and Rhythm: Tachycardia present. Rhythm irregular.     Heart sounds: Normal heart sounds. No murmur heard. Pulmonary:     Effort: Pulmonary effort is normal.     Breath sounds: Normal breath sounds. No wheezing or rales.  Musculoskeletal:     Right lower leg: Edema (1+) present.     Left lower leg: Edema (1+) present.      Assessment & Plan .     77 y.o. female with hypertension, persistent Afib, HFrEF, h/o pericardial effusion s/p pericardiocentesis (01/2021), severe WHO Grp II, admitted with acute on chronic HFrEF, metabolic encephalopathy, hallucinations  HFrEF: Nonischemic cardiomyopathy, moderate nonobstructive CAD.   Acute on chronic, mild compensation. Currently  on IV Lasix 60 mg twice daily, continue the same.  Creatinine 1.4, GFR 36. Concern for UTI, so not on SGLT2i. Rest of the GDMT limited due to renal dysfunction (GFR has been lower than 30). Strict I's/O.  Okay to continue metoprolol succinate 100 mg daily  Persistent A-fib: Remains in A-fib with RVR, but rate improving Currently on metoprolol succinate 100 mg daily, digoxin 0.125 mg daily   Hallucinations: Likely multifactorial, possibly metabolic encephalopathy.  I agree that pulmonary hypertension could be contributing to passive liver congestion and ammonia elevation.  Continue management as per primary team.  Discussed w/primary team.  For questions or updates, please contact [facility] Please consult [email or web address] for contact info under        Signed, Elder Negus, MD

## 2023-10-10 NOTE — Progress Notes (Signed)
Heart Failure Navigator Progress Note  Assessed for Heart & Vascular TOC clinic readiness.  Patient declined HF TOC appointment for a follow up, stated she has a [facility] appointment already scheduled for 10/27/2023, she doesn't want to add more appointments and get confused. Patient and her husband were educated on the sign and symptoms pf heart failure, daily weights, when ot call her doctor or go tot the ED.Diet/ fluid restrictions, taking all medications as prescribed and attending all medical appointments. Patient and husband verbalized their understanding.   Navigator will sign off at this time.   Rhae Hammock, BSN, Scientist, clinical (histocompatibility and immunogenetics) Only

## 2023-10-10 NOTE — Progress Notes (Signed)
Pt gagged and vomited a small amount when on pill taken.  She said this has been happening when she gets a taste of any medication lately.  Will hold off until patient's stomach is settled and then give meds crushed in applesauce.

## 2023-10-11 DIAGNOSIS — I5020 Unspecified systolic (congestive) heart failure: Secondary | ICD-10-CM | POA: Diagnosis not present

## 2023-10-11 DIAGNOSIS — I509 Heart failure, unspecified: Secondary | ICD-10-CM | POA: Diagnosis not present

## 2023-10-11 DIAGNOSIS — I4821 Permanent atrial fibrillation: Secondary | ICD-10-CM | POA: Diagnosis not present

## 2023-10-11 DIAGNOSIS — E440 Moderate protein-calorie malnutrition: Secondary | ICD-10-CM | POA: Insufficient documentation

## 2023-10-11 DIAGNOSIS — R443 Hallucinations, unspecified: Secondary | ICD-10-CM | POA: Diagnosis not present

## 2023-10-11 LAB — BASIC METABOLIC PANEL
Anion gap: 11 (ref 5–15)
BUN: 21 mg/dL (ref 8–23)
CO2: 30 mmol/L (ref 22–32)
Calcium: 9.5 mg/dL (ref 8.9–10.3)
Chloride: 98 mmol/L (ref 98–111)
Creatinine, Ser: 1.57 mg/dL — ABNORMAL HIGH (ref 0.44–1.00)
GFR, Estimated: 34 mL/min — ABNORMAL LOW (ref >60)
Glucose, Bld: 108 mg/dL — ABNORMAL HIGH (ref 70–99)
Potassium: 4.5 mmol/L (ref 3.5–5.1)
Sodium: 139 mmol/L (ref 135–145)

## 2023-10-11 LAB — CBC
HCT: 46.1 % — ABNORMAL HIGH (ref 36.0–46.0)
Hemoglobin: 14.3 g/dL (ref 12.0–15.0)
MCH: 26.2 pg (ref 26.0–34.0)
MCHC: 31 g/dL (ref 30.0–36.0)
MCV: 84.6 fL (ref 80.0–100.0)
Platelets: 252 10*3/uL (ref 150–400)
RBC: 5.45 MIL/uL — ABNORMAL HIGH (ref 3.87–5.11)
RDW: 15.1 % (ref 11.5–15.5)
WBC: 8.7 10*3/uL (ref 4.0–10.5)
nRBC: 0 % (ref 0.0–0.2)

## 2023-10-11 NOTE — Plan of Care (Signed)
  Problem: Education: Goal: Ability to demonstrate management of disease process will improve Outcome: Progressing Goal: Ability to verbalize understanding of medication therapies will improve Outcome: Progressing Goal: Individualized Educational Video(s) Outcome: Progressing   Problem: Activity: Goal: Capacity to carry out activities will improve Outcome: Progressing   Problem: Cardiac: Goal: Ability to achieve and maintain adequate cardiopulmonary perfusion will improve Outcome: Progressing   Problem: Education: Goal: Knowledge of General Education information will improve Description: Including pain rating scale, medication(s)/side effects and non-pharmacologic comfort measures Outcome: Progressing   Problem: Health Behavior/Discharge Planning: Goal: Ability to manage health-related needs will improve Outcome: Progressing   Problem: Clinical Measurements: Goal: Ability to maintain clinical measurements within normal limits will improve Outcome: Progressing Goal: Will remain free from infection Outcome: Progressing Goal: Diagnostic test results will improve Outcome: Progressing Goal: Respiratory complications will improve Outcome: Progressing Goal: Cardiovascular complication will be avoided Outcome: Progressing   Problem: Activity: Goal: Risk for activity intolerance will decrease Outcome: Progressing   Problem: Nutrition: Goal: Adequate nutrition will be maintained Outcome: Progressing   Problem: Coping: Goal: Level of anxiety will decrease Outcome: Progressing   Problem: Elimination: Goal: Will not experience complications related to bowel motility Outcome: Progressing Goal: Will not experience complications related to urinary retention Outcome: Progressing   Problem: Pain Management: Goal: General experience of comfort will improve Outcome: Progressing   Problem: Safety: Goal: Ability to remain free from injury will improve Outcome: Progressing    Problem: Skin Integrity: Goal: Risk for impaired skin integrity will decrease Outcome: Progressing

## 2023-10-11 NOTE — Progress Notes (Addendum)
PROGRESS NOTE    [name]  UVO:[id number] DOB: 01/08/1946 DOA: 10/08/2023 PCP: Creola Corn, MD  Chronically ill morbidly obese female with history of chronic systolic CHF, moderately reduced RV, persistent A-fib, history of pericardial effusion status post pericardiocentesis 2/22, CKD 3 A, hypertension, obesity, hypothyroidism presented to the ED with confusion and edema. -Reportedly was having hallucinations and confusion, Lasix was changed to Bumex. -PCP started her on trazodone after this suspecting insomnia induced delirium -Had worsening agitation and sundowning with cough for last few weeks, along with progressive dyspnea on exertion, in the ED mildly tachycardic at the [facility], labs with sodium 140, creatinine 1.6, BNP 1027, WBC 7.4, troponin 34, ammonia level 80 -Admitted, started on diuretics and lactulose  Subjective: Feels okay overall, breathing better, called family last night  Assessment and Plan:  Acute on chronic systolic CHF -Last echo 9/28 with a EF down to 30-35% with moderately reduced RV -Cardiology consulting -Continue IV Lasix 40 Mg twice daily, metoprolol, 2.1 L negative, urine output likely inaccurate -Poor candidate for SGLT2i with encephalopathy delirium etc., allergic to aldactone -Transition to oral diuretics tomorrow  Metabolic encephalopathy, hallucination -Now improving, likely multifactorial, combination of elevated ammonia, delirium -elevated ammonia likely from RV failure, hepatic congestion -CT, MRI brain were unremarkable, UA benign, B12 and TSH are normal -Could have untreated sleep apnea as well -Cut down lactulose, mental status is better, trazodone discontinued per family request - -continue PT OT, discharge planning  Persistent atrial fibrillation Continue Toprol and Eliquis  CKD 3B -Stable, monitor with diuresis  Hypothyroidism Continue Synthroid, TSH is normal   DVT prophylaxis: Apixaban Code Status: Full code Family  Communication: spouse at bedside Disposition Plan: Likely home with home health services  Consultants:    Procedures:   Antimicrobials:    Objective: Vitals:   10/10/23 1948 10/10/23 2326 10/11/23 0419 10/11/23 0818  BP: 126/86 120/88 (!) 137/98 (!) 107/59  Pulse: 86 73 (!) 127 73  Resp: 17 18 20 20   Temp: (!) 96 F (35.6 C) (!) 96 F (35.6 C) (!) 97.2 F (36.2 C) (!) 97.3 F (36.3 C)  TempSrc: Axillary Axillary Oral Axillary  SpO2: 90%  97% 96%  Weight:   91.8 kg   Height:        Intake/Output Summary (Last 24 hours) at 10/11/2023 1138 Last data filed at 10/11/2023 7425 Gross per 24 hour  Intake 235 ml  Output 2100 ml  Net -1865 ml   Filed Weights   10/09/23 1627 10/11/23 0419  Weight: 95.8 kg 91.8 kg    Examination:  General exam: Obese chronically ill female sitting up in the recliner, AAOx3 neck obese unable to assess JVD CVS: S1-S2, regular rhythm Lungs: Decreased breath sounds to bases Abdomen: Soft, obese, nontender, bowel sounds present Extremities: Trace edema Skin: No rashes Psychiatry:  Mood & affect appropriate.     Data Reviewed:   CBC: Recent Labs  Lab 10/08/23 1415 10/09/23 0443 10/11/23 0227  WBC 7.4 8.2 8.7  HGB 13.8 14.5 14.3  HCT 44.5 46.1* 46.1*  MCV 84.9 85.2 84.6  PLT 274 168 252   Basic Metabolic Panel: Recent Labs  Lab 10/08/23 1415 10/08/23 1533 10/08/23 2142 10/09/23 0555 10/10/23 0338 10/11/23 0227  NA 140  --   --  140 140 139  K 3.6  --   --  4.1 3.4* 4.5  CL 102  --   --  100 98 98  CO2 29  --   --  30 27  30  GLUCOSE 109*  --   --  112* 100* 108*  BUN 24*  --   --  23 22 21   CREATININE 1.62*  --   --  1.55* 1.48* 1.57*  CALCIUM 9.2  --   --  9.1 8.8* 9.5  MG  --  2.0  --  1.9  --   --   PHOS  --   --  3.5 3.8  --   --    GFR: Estimated Creatinine Clearance: 33.7 mL/min (A) (by C-G formula based on SCr of 1.57 mg/dL (H)). Liver Function Tests: Recent Labs  Lab 10/08/23 1415 10/09/23 0555  AST 20 28   ALT 15 16  ALKPHOS 42 39  BILITOT 0.7 1.1  PROT 6.3* 6.1*  ALBUMIN 3.4* 3.3*   No results for input(s): "LIPASE", "AMYLASE" in the last 168 hours. Recent Labs  Lab 10/08/23 1631 10/09/23 0800  AMMONIA 80* 12   Coagulation Profile: No results for input(s): "INR", "PROTIME" in the last 168 hours. Cardiac Enzymes: Recent Labs  Lab 10/08/23 2142  CKTOTAL 44   BNP (last 3 results) Recent Labs    09/24/23 1557  PROBNP 17,553*   HbA1C: No results for input(s): "HGBA1C" in the last 72 hours. CBG: Recent Labs  Lab 10/08/23 1556  GLUCAP 102*   Lipid Profile: No results for input(s): "CHOL", "HDL", "LDLCALC", "TRIG", "CHOLHDL", "LDLDIRECT" in the last 72 hours. Thyroid Function Tests: Recent Labs    10/08/23 1533  TSH 0.933   Anemia Panel: Recent Labs    10/09/23 0555  VITAMINB12 3,128*  FOLATE 16.8   Urine analysis:    Component Value Date/Time   COLORURINE STRAW (A) 10/08/2023 1454   APPEARANCEUR CLEAR 10/08/2023 1454   LABSPEC 1.008 10/08/2023 1454   PHURINE 6.0 10/08/2023 1454   GLUCOSEU NEGATIVE 10/08/2023 1454   HGBUR NEGATIVE 10/08/2023 1454   BILIRUBINUR NEGATIVE 10/08/2023 1454   KETONESUR NEGATIVE 10/08/2023 1454   PROTEINUR NEGATIVE 10/08/2023 1454   UROBILINOGEN 0.2 01/31/2011 1208   NITRITE NEGATIVE 10/08/2023 1454   LEUKOCYTESUR NEGATIVE 10/08/2023 1454   Sepsis Labs: @LABRCNTIP (procalcitonin:4,lacticidven:4)  )No results found for this or any previous visit (from the past 240 hour(s)).   Radiology Studies: No results found.   Scheduled Meds:  apixaban  5 mg Oral BID   digoxin  0.125 mg Oral Daily   feeding supplement  237 mL Oral BID BM   furosemide  60 mg Intravenous BID   guaiFENesin  600 mg Oral BID   isosorbide mononitrate  30 mg Oral Daily   lactulose  20 g Oral Daily   levothyroxine  125 mcg Oral QAC breakfast   metoprolol succinate  100 mg Oral Daily   multivitamin with minerals  1 tablet Oral Daily   pravastatin  80  mg Oral Daily   Continuous Infusions:     LOS: 2 days    Time spent:    Zannie Cove, MD [facility]   10/11/2023, 11:38 AM

## 2023-10-11 NOTE — Progress Notes (Signed)
Mobility Specialist Progress Note    10/11/23 1344  Mobility  Activity Ambulated with assistance to bathroom  Level of Assistance Minimal assist, patient does 75% or more  Assistive Device Front wheel walker  Distance Ambulated (ft) 40 ft (20+20)  Activity Response Tolerated well  Mobility Referral Yes  $Mobility charge 1 Mobility  Mobility Specialist Start Time (ACUTE ONLY) 1332  Mobility Specialist Stop Time (ACUTE ONLY) 1343  Mobility Specialist Time Calculation (min) (ACUTE ONLY) 11 min   Pre-Mobility: 75 HR  Pt received and agreeable. No complaints. Had some liquid BM. Returned to chair with call bell in reach and nursing students present.   [place] Mobility Specialist  Please Neurosurgeon or Rehab Office at [phone]

## 2023-10-11 NOTE — Progress Notes (Signed)
   Patient Name: WINDEE BURGDORF Date of Encounter: 10/11/2023 [facility] Cardiologist: Elder Negus, MD   Interval Summary  .    No acute overnight events.  Patient has good urinary output.  She believes that she is improving.  Continues to have orthopnea. Family is still worried about nighttime hallucinations.   Vital Signs .    Vitals:   10/10/23 1948 10/10/23 2326 10/11/23 0419 10/11/23 0818  BP: 126/86 120/88 (!) 137/98 (!) 107/59  Pulse: 86 73 (!) 127 73  Resp: 17 18 20 20   Temp: (!) 96 F (35.6 C) (!) 96 F (35.6 C) (!) 97.2 F (36.2 C) (!) 97.3 F (36.3 C)  TempSrc: Axillary Axillary Oral Axillary  SpO2: 90%  97% 96%  Weight:   91.8 kg   Height:        Intake/Output Summary (Last 24 hours) at 10/11/2023 1428 Last data filed at 10/11/2023 5284 Gross per 24 hour  Intake --  Output 1800 ml  Net -1800 ml      10/11/2023    4:19 AM 10/09/2023    4:27 PM 09/24/2023    3:39 PM  Last 3 Weights  Weight (lbs) 202 lb 6.1 oz 211 lb 3.2 oz 211 lb 3.2 oz  Weight (kg) 91.8 kg 95.8 kg 95.8 kg      Telemetry/ECG    10/11/2023 telemetry- Personally Reviewed Atrial fibrillation  Physical Exam .   General: Well-developed elderly female, in no acute distress. Neck: Elevated JVD. Cardiac: Tachycardic and irregular rhythm. Respiratory: Normal work of breathing. Extremities: 1+ pitting edema remains to the knees bilaterally.  Assessment & Plan .     Mrs. Brandii Rieser is a 77 y.o. 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  #Acute on chronic systolic heart failure: Currently on IV Lasix 60 mg twice daily, renal function has been stable.  She still has elevated JVD, orthopnea and lower extremity edema. Concern for UTI, so not on SGLT2i. Rest of the GDMT limited due to renal dysfunction (GFR has  been lower than 30). Strict I's/O.  Okay to continue metoprolol succinate 100 mg daily  #Persistent A-fib: Currently on metoprolol succinate 100 mg daily, digoxin 0.125 mg daily Continue apixaban   #Hallucinations: Ongoing, family is concerned.  Primary team to continue ongoing evaluation.  For questions or updates, please contact [facility] Please consult [email or web address] for contact info under       Signed, Nobie Putnam, MD

## 2023-10-12 DIAGNOSIS — I509 Heart failure, unspecified: Secondary | ICD-10-CM | POA: Diagnosis not present

## 2023-10-12 DIAGNOSIS — R443 Hallucinations, unspecified: Secondary | ICD-10-CM | POA: Diagnosis not present

## 2023-10-12 LAB — BASIC METABOLIC PANEL
Anion gap: 10 (ref 5–15)
BUN: 24 mg/dL — ABNORMAL HIGH (ref 8–23)
CO2: 29 mmol/L (ref 22–32)
Calcium: 8.9 mg/dL (ref 8.9–10.3)
Chloride: 100 mmol/L (ref 98–111)
Creatinine, Ser: 1.59 mg/dL — ABNORMAL HIGH (ref 0.44–1.00)
GFR, Estimated: 33 mL/min — ABNORMAL LOW (ref >60)
Glucose, Bld: 98 mg/dL (ref 70–99)
Potassium: 3.7 mmol/L (ref 3.5–5.1)
Sodium: 139 mmol/L (ref 135–145)

## 2023-10-12 MED ORDER — POTASSIUM CHLORIDE CRYS ER 20 MEQ PO TBCR
40.0000 meq | EXTENDED_RELEASE_TABLET | Freq: Once | ORAL | Status: AC
  Administered 2023-10-12: 40 meq via ORAL
  Filled 2023-10-12: qty 2

## 2023-10-12 MED ORDER — LACTULOSE 10 GM/15ML PO SOLN
10.0000 g | Freq: Every day | ORAL | Status: DC
  Administered 2023-10-13 – 2023-10-14 (×2): 10 g via ORAL
  Filled 2023-10-12 (×2): qty 15

## 2023-10-12 NOTE — Progress Notes (Signed)
   Patient Name: [name] Date of Encounter: 10/12/2023 [facility] Cardiologist: Elder Negus, MD   Interval Summary  .    No acute overnight events.  Patient has good urinary output, net -1.1L. Reports feeling relatively well. Was able to ambulate in halls with assistance but tired easily. No new or acute complaints.   Vital Signs .    Vitals:   10/11/23 2005 10/12/23 0031 10/12/23 0416 10/12/23 0735  BP: 130/87 (!) 123/97 (!) 140/97 132/82  Pulse: 81 (!) 46 99 (!) 115  Resp: 20 19 20 18   Temp: 98 F (36.7 C)  (!) 97.2 F (36.2 C) (!) 97.4 F (36.3 C)  TempSrc: Oral  Oral Oral  SpO2: 96% 97% 99% 97%  Weight:   92.1 kg   Height:        Intake/Output Summary (Last 24 hours) at 10/12/2023 1141 Last data filed at 10/12/2023 1610 Gross per 24 hour  Intake 360 ml  Output 802 ml  Net -442 ml      10/12/2023    4:16 AM 10/11/2023    4:19 AM 10/09/2023    4:27 PM  Last 3 Weights  Weight (lbs) 203 lb 0.7 oz 202 lb 6.1 oz 211 lb 3.2 oz  Weight (kg) 92.1 kg 91.8 kg 95.8 kg      Telemetry/ECG    10/11/2023 telemetry- Personally Reviewed Atrial fibrillation  Physical Exam .   General: Well-developed elderly female, in no acute distress. Neck: JVD low neck. Cardiac: Tachycardic and irregular rhythm. Respiratory: Normal work of breathing. Extremities: Trace edema remains to the knees bilaterally.  Assessment & Plan .     [name] is a 77 y.o. 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  #Acute on chronic systolic heart failure: Currently on IV Lasix 60 mg twice daily, renal function has been stable. Appears to be approaching euvolemia. Last day of IV lasix then transition to oral tomorrow. Concern for UTI, so not on SGLT2i. Rest of the GDMT limited due to renal dysfunction (GFR  has been lower than 30). Strict I's/O.  Okay to continue metoprolol succinate 100 mg daily  #Persistent A-fib: Currently on metoprolol succinate 100 mg daily, digoxin 0.125 mg daily Continue apixaban   #Hallucinations: Ongoing, family is concerned.  Primary team to continue ongoing evaluation.  For questions or updates, please contact [facility] Please consult [email or web address] for contact info under       Signed, Nobie Putnam, MD

## 2023-10-12 NOTE — Progress Notes (Signed)
Mobility Specialist Progress Note    10/12/23 0952  Mobility  Activity Transferred from bed to chair;Ambulated with assistance in room (Simultaneous filing. User may not have seen previous data.)  Level of Assistance Contact guard assist, steadying assist  Assistive Device Four wheel walker  Distance Ambulated (ft) 230 ft  Activity Response Tolerated well  Mobility Referral Yes  $Mobility charge 1 Mobility  Mobility Specialist Start Time (ACUTE ONLY) 0934  Mobility Specialist Stop Time (ACUTE ONLY) 0949  Mobility Specialist Time Calculation (min) (ACUTE ONLY) 15 min   Pre-Mobility: 80 HR, 100% SpO2 During Mobility: 103 HR Post-Mobility: 99 HR  Pt received in chair and agreeable. No complaints. Ambulated on RA. SpO2 with good pleth would go into low to mids 80s but recovered into low 90s with a standing rest break and pursed lip breathing. Returned to bathroom to attempt BM. Encouraged to pull string when ready to get up. NT aware.   [place] Mobility Specialist  Please Neurosurgeon or Rehab Office at [phone]

## 2023-10-12 NOTE — Plan of Care (Signed)
  Problem: Education: Goal: Ability to demonstrate management of disease process will improve Outcome: Progressing Goal: Ability to verbalize understanding of medication therapies will improve Outcome: Progressing Goal: Individualized Educational Video(s) Outcome: Progressing   Problem: Activity: Goal: Capacity to carry out activities will improve Outcome: Progressing   Problem: Cardiac: Goal: Ability to achieve and maintain adequate cardiopulmonary perfusion will improve Outcome: Progressing   Problem: Education: Goal: Knowledge of General Education information will improve Description: Including pain rating scale, medication(s)/side effects and non-pharmacologic comfort measures Outcome: Progressing   Problem: Health Behavior/Discharge Planning: Goal: Ability to manage health-related needs will improve Outcome: Progressing   Problem: Clinical Measurements: Goal: Ability to maintain clinical measurements within normal limits will improve Outcome: Progressing Goal: Will remain free from infection Outcome: Progressing Goal: Diagnostic test results will improve Outcome: Progressing Goal: Respiratory complications will improve Outcome: Progressing Goal: Cardiovascular complication will be avoided Outcome: Progressing   Problem: Activity: Goal: Risk for activity intolerance will decrease Outcome: Progressing   Problem: Nutrition: Goal: Adequate nutrition will be maintained Outcome: Progressing   Problem: Coping: Goal: Level of anxiety will decrease Outcome: Progressing   Problem: Elimination: Goal: Will not experience complications related to bowel motility Outcome: Progressing Goal: Will not experience complications related to urinary retention Outcome: Progressing   Problem: Pain Management: Goal: General experience of comfort will improve Outcome: Progressing   Problem: Safety: Goal: Ability to remain free from injury will improve Outcome: Progressing    Problem: Skin Integrity: Goal: Risk for impaired skin integrity will decrease Outcome: Progressing

## 2023-10-12 NOTE — Progress Notes (Signed)
PROGRESS NOTE    [name]  EAV:[id number] DOB: 26-Nov-1946 DOA: 10/08/2023 PCP: Creola Corn, MD  Chronically ill morbidly obese female with history of chronic systolic CHF, moderately reduced RV, persistent A-fib, history of pericardial effusion status post pericardiocentesis 2/22, CKD 3 A, hypertension, obesity, hypothyroidism presented to the ED with confusion and edema. -Reportedly was having hallucinations and confusion, Lasix was changed to Bumex. -PCP started her on trazodone after this suspecting insomnia induced delirium -Had worsening agitation and sundowning with cough for last few weeks, along with progressive dyspnea on exertion, in the ED mildly tachycardic at the VSS, labs with sodium 140, creatinine 1.6, BNP 1027, WBC 7.4, troponin 34, ammonia level 80 -Admitted, started on diuretics and lactulose  Subjective: Slept better last night, got a bit agitated when they try to put her on O2 in the middle of the night  Assessment and Plan:  Acute on chronic systolic CHF -Last echo 9/28 with a EF down to 30-35% with moderately reduced RV -Cardiology consulting -3 L negative so far, weight down 8 LB, continue IV Lasix 1 more day, continue metoprolol -Poor candidate for SGLT2i with encephalopathy delirium etc., allergic to aldactone -Transition to oral diuretics tomorrow -Discharge planning  Metabolic encephalopathy, hallucination -Now improving, likely multifactorial, combination of elevated ammonia, delirium -elevated ammonia likely from RV failure, hepatic congestion -CT, MRI brain were unremarkable, UA benign, B12 and TSH are normal -Could have untreated sleep apnea as well -Cut down lactulose, mental status is better, trazodone discontinued per family request - -continue PT OT, discharge planning  Persistent atrial fibrillation Continue Toprol and Eliquis  CKD 3B -Stable, monitor with diuresis  Hypothyroidism Continue Synthroid, TSH is normal   DVT prophylaxis:  Apixaban Code Status: Full code Family Communication: Daughter at bedside Disposition Plan: Home in 1 to 2 days  Consultants:    Procedures:   Antimicrobials:    Objective: Vitals:   10/11/23 2005 10/12/23 0031 10/12/23 0416 10/12/23 0735  BP: 130/87 (!) 123/97 (!) 140/97 132/82  Pulse: 81 (!) 46 99 (!) 115  Resp: 20 19 20 18   Temp: 98 F (36.7 C)  (!) 97.2 F (36.2 C) (!) 97.4 F (36.3 C)  TempSrc: Oral  Oral Oral  SpO2: 96% 97% 99% 97%  Weight:   92.1 kg   Height:        Intake/Output Summary (Last 24 hours) at 10/12/2023 1115 Last data filed at 10/12/2023 7829 Gross per 24 hour  Intake 360 ml  Output 802 ml  Net -442 ml   Filed Weights   10/09/23 1627 10/11/23 0419 10/12/23 0416  Weight: 95.8 kg 91.8 kg 92.1 kg    Examination:  General exam: Obese chronically ill female sitting up in the recliner, AAOx3 HEENT: Neck obese CVS: S1-S2, regular rhythm Lungs: Decreased breath sounds the bases Abdomen: Soft, obese, nontender, bowel sounds present Extremities: 1+ edema  Skin: No rashes Psychiatry:  Mood & affect appropriate.     Data Reviewed:   CBC: Recent Labs  Lab 10/08/23 1415 10/09/23 0443 10/11/23 0227  WBC 7.4 8.2 8.7  HGB 13.8 14.5 14.3  HCT 44.5 46.1* 46.1*  MCV 84.9 85.2 84.6  PLT 274 168 252   Basic Metabolic Panel: Recent Labs  Lab 10/08/23 1415 10/08/23 1533 10/08/23 2142 10/09/23 0555 10/10/23 0338 10/11/23 0227 10/12/23 0311  NA 140  --   --  140 140 139 139  K 3.6  --   --  4.1 3.4* 4.5 3.7  CL 102  --   --  100 98 98 100  CO2 29  --   --  30 27 30 29   GLUCOSE 109*  --   --  112* 100* 108* 98  BUN 24*  --   --  23 22 21  24*  CREATININE 1.62*  --   --  1.55* 1.48* 1.57* 1.59*  CALCIUM 9.2  --   --  9.1 8.8* 9.5 8.9  MG  --  2.0  --  1.9  --   --   --   PHOS  --   --  3.5 3.8  --   --   --    GFR: Estimated Creatinine Clearance: 33.3 mL/min (A) (by C-G formula based on SCr of 1.59 mg/dL (H)). Liver Function  Tests: Recent Labs  Lab 10/08/23 1415 10/09/23 0555  AST 20 28  ALT 15 16  ALKPHOS 42 39  BILITOT 0.7 1.1  PROT 6.3* 6.1*  ALBUMIN 3.4* 3.3*   No results for input(s): "LIPASE", "AMYLASE" in the last 168 hours. Recent Labs  Lab 10/08/23 1631 10/09/23 0800  AMMONIA 80* 12   Coagulation Profile: No results for input(s): "INR", "PROTIME" in the last 168 hours. Cardiac Enzymes: Recent Labs  Lab 10/08/23 2142  CKTOTAL 44   BNP (last 3 results) Recent Labs    09/24/23 1557  PROBNP 17,553*   HbA1C: No results for input(s): "HGBA1C" in the last 72 hours. CBG: Recent Labs  Lab 10/08/23 1556  GLUCAP 102*   Lipid Profile: No results for input(s): "CHOL", "HDL", "LDLCALC", "TRIG", "CHOLHDL", "LDLDIRECT" in the last 72 hours. Thyroid Function Tests: No results for input(s): "TSH", "T4TOTAL", "FREET4", "T3FREE", "THYROIDAB" in the last 72 hours.  Anemia Panel: No results for input(s): "VITAMINB12", "FOLATE", "FERRITIN", "TIBC", "IRON", "RETICCTPCT" in the last 72 hours.  Urine analysis:    Component Value Date/Time   COLORURINE STRAW (A) 10/08/2023 1454   APPEARANCEUR CLEAR 10/08/2023 1454   LABSPEC 1.008 10/08/2023 1454   PHURINE 6.0 10/08/2023 1454   GLUCOSEU NEGATIVE 10/08/2023 1454   HGBUR NEGATIVE 10/08/2023 1454   BILIRUBINUR NEGATIVE 10/08/2023 1454   KETONESUR NEGATIVE 10/08/2023 1454   PROTEINUR NEGATIVE 10/08/2023 1454   UROBILINOGEN 0.2 01/31/2011 1208   NITRITE NEGATIVE 10/08/2023 1454   LEUKOCYTESUR NEGATIVE 10/08/2023 1454   Sepsis Labs: @LABRCNTIP (procalcitonin:4,lacticidven:4)  )No results found for this or any previous visit (from the past 240 hour(s)).   Radiology Studies: No results found.   Scheduled Meds:  apixaban  5 mg Oral BID   digoxin  0.125 mg Oral Daily   feeding supplement  237 mL Oral BID BM   furosemide  60 mg Intravenous BID   guaiFENesin  600 mg Oral BID   isosorbide mononitrate  30 mg Oral Daily   [START ON  10/13/2023] lactulose  10 g Oral Daily   levothyroxine  125 mcg Oral QAC breakfast   metoprolol succinate  100 mg Oral Daily   multivitamin with minerals  1 tablet Oral Daily   pravastatin  80 mg Oral Daily   Continuous Infusions:     LOS: 3 days    Time spent:    Zannie Cove, MD [facility]   10/12/2023, 11:15 AM

## 2023-10-13 DIAGNOSIS — I509 Heart failure, unspecified: Secondary | ICD-10-CM | POA: Diagnosis not present

## 2023-10-13 DIAGNOSIS — N1832 Chronic kidney disease, stage 3b: Secondary | ICD-10-CM | POA: Diagnosis not present

## 2023-10-13 DIAGNOSIS — R443 Hallucinations, unspecified: Secondary | ICD-10-CM | POA: Diagnosis not present

## 2023-10-13 DIAGNOSIS — I5020 Unspecified systolic (congestive) heart failure: Secondary | ICD-10-CM | POA: Diagnosis not present

## 2023-10-13 DIAGNOSIS — I4819 Other persistent atrial fibrillation: Secondary | ICD-10-CM | POA: Diagnosis not present

## 2023-10-13 LAB — BASIC METABOLIC PANEL
Anion gap: 8 (ref 5–15)
BUN: 36 mg/dL — ABNORMAL HIGH (ref 8–23)
CO2: 28 mmol/L (ref 22–32)
Calcium: 9.1 mg/dL (ref 8.9–10.3)
Chloride: 99 mmol/L (ref 98–111)
Creatinine, Ser: 1.73 mg/dL — ABNORMAL HIGH (ref 0.44–1.00)
GFR, Estimated: 30 mL/min — ABNORMAL LOW (ref >60)
Glucose, Bld: 106 mg/dL — ABNORMAL HIGH (ref 70–99)
Potassium: 3.9 mmol/L (ref 3.5–5.1)
Sodium: 135 mmol/L (ref 135–145)

## 2023-10-13 MED ORDER — FUROSEMIDE 40 MG PO TABS
40.0000 mg | ORAL_TABLET | Freq: Two times a day (BID) | ORAL | Status: DC
  Administered 2023-10-13 – 2023-10-14 (×3): 40 mg via ORAL
  Filled 2023-10-13 (×3): qty 1

## 2023-10-13 NOTE — Progress Notes (Signed)
   Patient Name: [name] Date of Encounter: 10/13/2023 [facility] Cardiologist: Elder Negus, MD   Assessment & Plan .    [name] is a 77 y.o. 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   #Acute on chronic systolic heart failure: (Nonischemic cardiomyopathy) Appears euvolemic now.  Converted to p.o. Lasix 40 mg daily. Would recommend standing dose of Lasix 40 mg daily but provide for additional dosing based on sliding scale with daily weights Sliding scale Lasix: Weigh yourself when you get home, then Daily in the Morning. Your dry weight will be what your scale says on the day you return home. If you gain more than 3 pounds from dry weight: Increase the Lasix dosing to 40 mg in the morning and 40 mg in the afternoon until weight returns to baseline dry weight. If weight gain is greater than 5 pounds in 2 days: Increased to Lasix 80 mg in AM & 40 mg PM x 1 day & reassess weight on Day 2. If no improvement, contact the office for further assistance if weight does not go down the next day. If the weight goes down more than 3 pounds from dry weight: Hold Lasix until it returns to baseline dry weight No SGLT2 inhibitor due to history of UTI. Other GDMT difficult to titrate due to renal dysfunction-continue Toprol and digoxin Closely follow her volume status   #Persistent A-fib: Tolerating metoprolol succinate and digoxin.  Will need to reassess digoxin dosing and check levels prior to discharge.    #Hallucinations: Ongoing, family is concerned.  Primary team to continue ongoing evaluation.  Anticipate discharge tomorrow if stable. We will check in and make sure that she has outpatient appointment scheduled.  Interval Summary  .    Feels well this morning.  Had a large BM today after being  given some liquid medicine. Breathing well.  Vital Signs .    Vitals:   10/13/23 0400 10/13/23 0725 10/13/23 1030 10/13/23 1130  BP:  132/78  (!) 106/57  Pulse:  90 73 81  Resp: 18 18  18   Temp: 98.1 F (36.7 C) (!) 97.3 F (36.3 C)  97.7 F (36.5 C)  TempSrc: Axillary Oral  Oral  SpO2:  96%  96%  Weight:      Height:        Intake/Output Summary (Last 24 hours) at 10/13/2023 1335 Last data filed at 10/13/2023 1307 Gross per 24 hour  Intake 360 ml  Output 1300 ml  Net -940 ml      10/12/2023    4:16 AM 10/11/2023    4:19 AM 10/09/2023    4:27 PM  Last 3 Weights  Weight (lbs) 203 lb 0.7 oz 202 lb 6.1 oz 211 lb 3.2 oz  Weight (kg) 92.1 kg 91.8 kg 95.8 kg      Telemetry/ECG    A-fib- Personally Reviewed  Physical Exam .   GEN: No acute distress.   Neck: No JVD or bruit Cardiac: Irregularly irregular, regular rate.  No murmurs, rubs, or gallops.  Respiratory: Clear to auscultation bilaterally.  Nonlabored.  Good air movement. GI: Soft, nontender, non-distended  MS: No edema   For questions or updates, please contact [facility] Please consult [email or web address] for contact info under        Signed, Bryan Lemma, MD

## 2023-10-13 NOTE — Progress Notes (Signed)
PROGRESS NOTE    [name]  ZOX:[id number] DOB: [birth year] DOA: 10/08/2023 PCP: Creola Corn, MD  Chronically ill morbidly obese female with history of chronic systolic CHF, moderately reduced RV, persistent A-fib, history of pericardial effusion status post pericardiocentesis 2/22, CKD 3 A, hypertension, obesity, hypothyroidism presented to the ED with confusion and edema. -Reportedly was having hallucinations and confusion, Lasix was changed to Bumex. -PCP started her on trazodone after this suspecting insomnia induced delirium -Had worsening agitation and sundowning with cough for last few weeks, along with progressive dyspnea on exertion, in the ED mildly tachycardic at the VSS, labs with sodium 140, creatinine 1.6, BNP 1027, WBC 7.4, troponin 34, ammonia level 80 -Admitted, started on diuretics and lactulose  Subjective: Slept better last night, got a bit agitated when they try to put her on O2 in the middle of the night  Assessment and Plan:  Acute on chronic systolic CHF -Last echo 9/28 with a EF down to 30-35% with moderately reduced RV -Cardiology consulting - improving on diuretics, weight down 8 LB, switch to oral Lasix today, continue metoprolol -Poor candidate for SGLT2i with recent delirium, obesity and limited mobility etc., allergic to aldactone -Discharge planning  Metabolic encephalopathy, hallucination -Now improving, likely multifactorial, combination of elevated ammonia, delirium -elevated ammonia likely from RV failure, hepatic congestion -CT, MRI brain were unremarkable, UA benign, B12 and TSH are normal -Could have untreated sleep apnea as well -Cut down lactulose, mental status is better, trazodone discontinued per family request - -continue PT OT, discharge planning  Persistent atrial fibrillation Continue Toprol and Eliquis  CKD 3B -Stable, monitor with diuresis  Hypothyroidism Continue Synthroid, TSH is normal  Suspected sleep apnea -She desats  at night, TOC consult to see if she can be set up with home O2 nightly -Recommend sleep study as outpatient   DVT prophylaxis: Apixaban Code Status: Full code Family Communication: Daughter at bedside Disposition Plan: Home tomorrow  Consultants:    Procedures:   Antimicrobials:    Objective: Vitals:   10/12/23 2010 10/13/23 0035 10/13/23 0400 10/13/23 0725  BP: 131/78 124/73  132/78  Pulse: 89 87  (!) 25  Resp: 18 18 18 18   Temp: (!) 97.3 F (36.3 C)  98.1 F (36.7 C) (!) 97.3 F (36.3 C)  TempSrc: Oral  Axillary Oral  SpO2: 100% 96%  96%  Weight:      Height:        Intake/Output Summary (Last 24 hours) at 10/13/2023 1009 Last data filed at 10/13/2023 8119 Gross per 24 hour  Intake 360 ml  Output 1300 ml  Net -940 ml   Filed Weights   10/09/23 1627 10/11/23 0419 10/12/23 0416  Weight: 95.8 kg 91.8 kg 92.1 kg    Examination:  General exam: Obese chronically ill female sitting up in the recliner, AAOx3 HEENT: Neck obese CVS: S1-S2, regular rhythm Lungs: Decreased breath sounds the bases Abdomen: Soft, obese, nontender, bowel sounds present Extremities: 1+ edema  Skin: No rashes Psychiatry:  Mood & affect appropriate.     Data Reviewed:   CBC: Recent Labs  Lab 10/08/23 1415 10/09/23 0443 10/11/23 0227  WBC 7.4 8.2 8.7  HGB 13.8 14.5 14.3  HCT 44.5 46.1* 46.1*  MCV 84.9 85.2 84.6  PLT 274 168 252   Basic Metabolic Panel: Recent Labs  Lab 10/08/23 1415 10/08/23 1533 10/08/23 2142 10/09/23 0555 10/10/23 0338 10/11/23 0227 10/12/23 0311  NA 140  --   --  140 140 139 139  K 3.6  --   --  4.1 3.4* 4.5 3.7  CL 102  --   --  100 98 98 100  CO2 29  --   --  30 27 30 29   GLUCOSE 109*  --   --  112* 100* 108* 98  BUN 24*  --   --  23 22 21  24*  CREATININE 1.62*  --   --  1.55* 1.48* 1.57* 1.59*  CALCIUM 9.2  --   --  9.1 8.8* 9.5 8.9  MG  --  2.0  --  1.9  --   --   --   PHOS  --   --  3.5 3.8  --   --   --    GFR: Estimated Creatinine  Clearance: 33.3 mL/min (A) (by C-G formula based on SCr of 1.59 mg/dL (H)). Liver Function Tests: Recent Labs  Lab 10/08/23 1415 10/09/23 0555  AST 20 28  ALT 15 16  ALKPHOS 42 39  BILITOT 0.7 1.1  PROT 6.3* 6.1*  ALBUMIN 3.4* 3.3*   No results for input(s): "LIPASE", "AMYLASE" in the last 168 hours. Recent Labs  Lab 10/08/23 1631 10/09/23 0800  AMMONIA 80* 12   Coagulation Profile: No results for input(s): "INR", "PROTIME" in the last 168 hours. Cardiac Enzymes: Recent Labs  Lab 10/08/23 2142  CKTOTAL 44   BNP (last 3 results) Recent Labs    09/24/23 1557  PROBNP 17,553*   HbA1C: No results for input(s): "HGBA1C" in the last 72 hours. CBG: Recent Labs  Lab 10/08/23 1556  GLUCAP 102*   Lipid Profile: No results for input(s): "CHOL", "HDL", "LDLCALC", "TRIG", "CHOLHDL", "LDLDIRECT" in the last 72 hours. Thyroid Function Tests: No results for input(s): "TSH", "T4TOTAL", "FREET4", "T3FREE", "THYROIDAB" in the last 72 hours.  Anemia Panel: No results for input(s): "VITAMINB12", "FOLATE", "FERRITIN", "TIBC", "IRON", "RETICCTPCT" in the last 72 hours.  Urine analysis:    Component Value Date/Time   COLORURINE STRAW (A) 10/08/2023 1454   APPEARANCEUR CLEAR 10/08/2023 1454   LABSPEC 1.008 10/08/2023 1454   PHURINE 6.0 10/08/2023 1454   GLUCOSEU NEGATIVE 10/08/2023 1454   HGBUR NEGATIVE 10/08/2023 1454   BILIRUBINUR NEGATIVE 10/08/2023 1454   KETONESUR NEGATIVE 10/08/2023 1454   PROTEINUR NEGATIVE 10/08/2023 1454   UROBILINOGEN 0.2 01/31/2011 1208   NITRITE NEGATIVE 10/08/2023 1454   LEUKOCYTESUR NEGATIVE 10/08/2023 1454   Sepsis Labs: @LABRCNTIP (procalcitonin:4,lacticidven:4)  )No results found for this or any previous visit (from the past 240 hour(s)).   Radiology Studies: No results found.   Scheduled Meds:  apixaban  5 mg Oral BID   digoxin  0.125 mg Oral Daily   feeding supplement  237 mL Oral BID BM   furosemide  40 mg Oral BID    guaiFENesin  600 mg Oral BID   isosorbide mononitrate  30 mg Oral Daily   lactulose  10 g Oral Daily   levothyroxine  125 mcg Oral QAC breakfast   metoprolol succinate  100 mg Oral Daily   multivitamin with minerals  1 tablet Oral Daily   pravastatin  80 mg Oral Daily   Continuous Infusions:     LOS: 4 days    Time spent:    Zannie Cove, MD [facility]   10/13/2023, 10:09 AM

## 2023-10-13 NOTE — Progress Notes (Signed)
Physical Therapy Treatment Patient Details Name: [name] MRN: [id number] DOB: 1946/03/11 Today's Date: 10/13/2023   History of Present Illness 77 y.o. female presented [facility] 11[facility] for worsening BLE edema, cough, and AMS with hallucinations. Pt found to have increased ammonia level. PMH: chronic diastolic CHF, pericardial effusion status post pericardiocentesis, persistent A fib, CKD.    PT Comments  Pt is demonstrating good functional progress as she was able to ambulate an increased distance of up to ~450 ft with a rollator today. She also displayed improved ease with transfers, but continues to display deficits in strength and power, resulting in her needing to use her upper extremities to assist with transfers and needing cues for eccentric control when sitting. Pt performed serial sit <> stand reps to try to improve her strength, power, and control with transfers. Pt was able to withstand dynamic gait challenges without LOB. She did display mild instability 1x when nodding her head up <> down though. Will continue to follow acutely.     If plan is discharge home, recommend the following: A little help with walking and/or transfers;A little help with bathing/dressing/bathroom;Assistance with cooking/housework;Direct supervision/assist for medications management;Direct supervision/assist for financial management;Assist for transportation;Help with stairs or ramp for entrance   Can travel by private vehicle     Yes  Equipment Recommendations  None recommended by PT    Recommendations for Other Services       Precautions / Restrictions Precautions Precautions: Fall Precaution Comments: in afib; bowel incontinence (not this session) Restrictions Weight Bearing Restrictions: No     Mobility  Bed Mobility               General bed mobility comments: Pt in recliner start and end of session    Transfers Overall transfer level: Needs assistance Equipment used: Rollator (4  wheels) Transfers: Sit to/from Stand Sit to Stand: Contact guard assist           General transfer comment: Pt with noted improved ease powering up to stand but still needing slightly increased time and UEs to assist in powering up to stand. Cues needed for eccentric control when sitting as pt tends to display poor control. CGA for safety    Ambulation/Gait Ambulation/Gait assistance: Contact guard assist, Supervision Gait Distance (Feet): 450 Feet Assistive device: Rollator (4 wheels) Gait Pattern/deviations: Step-through pattern, Decreased stride length, Trunk flexed Gait velocity: decreased Gait velocity interpretation: 1.31 - 2.62 ft/sec, indicative of limited community ambulator (when cued to increase speed)   General Gait Details: Pt ambulates at a slow but steady pace with kyphotic posture. Cues provided for upright posture and increased step length bil, mod success noted. Cues provided for pt to negotiate around "obtsacles" of dark tiles without running over them, facilitating pt to weave in and out of tiles. Cues provided for pt to turn head L <> R and nod head up <> down, x1 moment of instability when looking up <> down but no LOB. Cues provided also for pt to increase speed and suddenly stop. No LOB, CGA-supervision for safety   Stairs             Wheelchair Mobility     Tilt Bed    Modified Rankin (Stroke Patients Only)       Balance Overall balance assessment: Needs assistance Sitting-balance support: No upper extremity supported Sitting balance-Leahy Scale: Fair     Standing balance support: During functional activity, No upper extremity supported, Bilateral upper extremity supported Standing balance-Leahy Scale: Fair  Standing balance comment: Reliant on rollator when ambulating but able to stand and reach min off COG without UE support                            Cognition Arousal: Alert Behavior During Therapy: WFL for tasks  assessed/performed Overall Cognitive Status: Impaired/Different from baseline Area of Impairment: Attention, Following commands, Awareness                   Current Attention Level: Selective   Following Commands: Follows multi-step commands with increased time   Awareness: Anticipatory   General Comments: Pt seems closer to her baseline, following multi-step commands and seemingly aware of her deficits and safety        Exercises Other Exercises Other Exercises: sit <> stand 5x with UE support, cuing pt for eccentric control. Pt just lightly touching seat surface before standing again    General Comments General comments (skin integrity, edema, etc.): VSS on RA      Pertinent Vitals/Pain Pain Assessment Pain Assessment: Faces Faces Pain Scale: No hurt Pain Intervention(s): Monitored during session    Home Living                          Prior Function            PT Goals (current goals can now be found in the care plan section) Acute Rehab PT Goals Patient Stated Goal: to return to independence PT Goal Formulation: With patient/family Time For Goal Achievement: 10/23/23 Potential to Achieve Goals: Good Progress towards PT goals: Progressing toward goals    Frequency    Min 1X/week      PT Plan      Co-evaluation              AM-PAC PT "6 Clicks" Mobility   Outcome Measure  Help needed turning from your back to your side while in a flat bed without using bedrails?: A Little Help needed moving from lying on your back to sitting on the side of a flat bed without using bedrails?: A Little Help needed moving to and from a bed to a chair (including a wheelchair)?: A Little Help needed standing up from a chair using your arms (e.g., wheelchair or bedside chair)?: A Little Help needed to walk in hospital room?: A Little Help needed climbing 3-5 steps with a railing? : A Lot 6 Click Score: 17    End of Session Equipment Utilized  During Treatment: Gait belt Activity Tolerance: Patient tolerated treatment well Patient left: with call bell/phone within reach;in chair;with family/visitor present Nurse Communication: Mobility status (NT) PT Visit Diagnosis: Unsteadiness on feet (R26.81);Other abnormalities of gait and mobility (R26.89);Muscle weakness (generalized) (M62.81);Pain     Time: 1531-1556 PT Time Calculation (min) (ACUTE ONLY): 25 min  Charges:    $Gait Training: 8-22 mins $Therapeutic Exercise: 8-22 mins PT General Charges $$ ACUTE PT VISIT: 1 Visit                     Virgil Benedict, PT, DPT Acute Rehabilitation Services  Office: [phone]    Bettina Gavia 10/13/2023, 4:10 PM

## 2023-10-13 NOTE — Progress Notes (Signed)
Mobility Specialist Progress Note:    10/13/23 1039  Mobility  Activity Ambulated with assistance in hallway  Level of Assistance Contact guard assist, steadying assist  Assistive Device Four wheel walker  Distance Ambulated (ft) 250 ft  Activity Response Tolerated well  Mobility Referral Yes  $Mobility charge 1 Mobility  Mobility Specialist Start Time (ACUTE ONLY) 0945  Mobility Specialist Stop Time (ACUTE ONLY) 0957  Mobility Specialist Time Calculation (min) (ACUTE ONLY) 12 min   Received pt in chair having no complaints and agreeable to mobility. Pt was asymptomatic throughout ambulation, no need for O2 during session, VSS. Returned to room w/o fault. Left in chair w/ call bell in reach and all needs met.   Pre Mobility SPO2 RA 98% During Mobility SPO2 RA 93%-100% Post Mobility SPO2 RA 99%  Thompson Grayer Mobility Specialist  Please contact vis Secure Chat or  Rehab Office [phone]

## 2023-10-13 NOTE — Progress Notes (Signed)
Mobility Specialist Progress Note: Nurse requested Mobility Specialist to perform oxygen saturation test with pt which includes removing pt from oxygen both at rest and while ambulating.  Below are the results from that testing.     Patient Saturations on Room Air at Rest = spO2 98%  Patient Saturations on Room Air while Ambulating = sp02 93% .  Rested and performed pursed lip breathing for 1 minute with sp02 at 100%.  At end of testing pt left in room on 0  Liters of oxygen.  Reported results to nurse.    Thompson Grayer Mobility Specialist  Please contact vis Secure Chat or  Rehab Office [phone]

## 2023-10-13 NOTE — Plan of Care (Signed)
  Problem: Education: Goal: Ability to demonstrate management of disease process will improve Outcome: Progressing Goal: Ability to verbalize understanding of medication therapies will improve Outcome: Progressing Goal: Individualized Educational Video(s) Outcome: Progressing   Problem: Activity: Goal: Capacity to carry out activities will improve Outcome: Progressing   Problem: Cardiac: Goal: Ability to achieve and maintain adequate cardiopulmonary perfusion will improve Outcome: Progressing   Problem: Education: Goal: Knowledge of General Education information will improve Description: Including pain rating scale, medication(s)/side effects and non-pharmacologic comfort measures Outcome: Progressing   Problem: Health Behavior/Discharge Planning: Goal: Ability to manage health-related needs will improve Outcome: Progressing   Problem: Clinical Measurements: Goal: Ability to maintain clinical measurements within normal limits will improve Outcome: Progressing Goal: Will remain free from infection Outcome: Progressing Goal: Diagnostic test results will improve Outcome: Progressing Goal: Respiratory complications will improve Outcome: Progressing Goal: Cardiovascular complication will be avoided Outcome: Progressing   Problem: Activity: Goal: Risk for activity intolerance will decrease Outcome: Progressing   Problem: Nutrition: Goal: Adequate nutrition will be maintained Outcome: Progressing   Problem: Coping: Goal: Level of anxiety will decrease Outcome: Progressing   Problem: Elimination: Goal: Will not experience complications related to bowel motility Outcome: Progressing Goal: Will not experience complications related to urinary retention Outcome: Progressing   Problem: Pain Management: Goal: General experience of comfort will improve Outcome: Progressing   Problem: Safety: Goal: Ability to remain free from injury will improve Outcome: Progressing    Problem: Skin Integrity: Goal: Risk for impaired skin integrity will decrease Outcome: Progressing

## 2023-10-14 DIAGNOSIS — R443 Hallucinations, unspecified: Secondary | ICD-10-CM | POA: Diagnosis not present

## 2023-10-14 LAB — BASIC METABOLIC PANEL
Anion gap: 10 (ref 5–15)
BUN: 32 mg/dL — ABNORMAL HIGH (ref 8–23)
CO2: 29 mmol/L (ref 22–32)
Calcium: 8.8 mg/dL — ABNORMAL LOW (ref 8.9–10.3)
Chloride: 98 mmol/L (ref 98–111)
Creatinine, Ser: 1.7 mg/dL — ABNORMAL HIGH (ref 0.44–1.00)
GFR, Estimated: 31 mL/min — ABNORMAL LOW (ref >60)
Glucose, Bld: 89 mg/dL (ref 70–99)
Potassium: 3.7 mmol/L (ref 3.5–5.1)
Sodium: 137 mmol/L (ref 135–145)

## 2023-10-14 LAB — DIGOXIN LEVEL: Digoxin Level: 0.6 ng/mL — ABNORMAL LOW (ref 0.8–2.0)

## 2023-10-14 MED ORDER — FUROSEMIDE 40 MG PO TABS: 40.0000 mg | ORAL_TABLET | Freq: Every day | ORAL | 0 refills | Status: DC

## 2023-10-14 MED ORDER — POTASSIUM CHLORIDE CRYS ER 20 MEQ PO TBCR: 20.0000 meq | EXTENDED_RELEASE_TABLET | Freq: Every day | ORAL | 0 refills | Status: DC

## 2023-10-14 MED ORDER — DIGOXIN 125 MCG PO TABS: 0.1250 mg | ORAL_TABLET | Freq: Every day | ORAL | 0 refills | Status: DC

## 2023-10-14 MED ORDER — LACTULOSE 10 GM/15ML PO SOLN: 10.0000 g | ORAL | 0 refills | Status: DC

## 2023-10-14 NOTE — Consult Note (Signed)
[facility] [VBCI] [facility] Liaison Consult Note    10/14/2023  [name] 25-Apr-1946 [id number]  Insurance: [employer] [place]   Primary Care Provider: Creola Corn, MD with [facility], this provider is listed for the transition of care follow up appointments and calls   [facility] Liaison met patient at bedside at [facility]. Patient and spouse at bedside, anticipating going home.    The patient was screened with noted high risk score for unplanned readmission risk 1 ED and 3 hospital admissions in 6 months.  The patient was assessed for potential [facility] Coordination Pharmacy service needs for post hospital transition for care coordination for this particular provider. Patient states she has no additional pharmacy needs as known at this time.  Plan:  Referral request for community care coordination: Provider office will follow for needs, no VBCI   VBCI Community Care, Population Health does not replace or interfere with any arrangements made by the Inpatient Transition of Care team.   For questions contact:   Charlesetta Shanks, RN, BSN, CCM [facility] Liaison Direct Dial: [phone] or secure chat Email: [email or web address]

## 2023-10-14 NOTE — Care Management Important Message (Signed)
Important Message  Patient Details  Name: [name] MRN: [id number] Date of Birth: [birth year]   Important Message Given:  Yes - Medicare IM     Dorena Bodo 10/14/2023, 12:43 PM

## 2023-10-14 NOTE — Plan of Care (Signed)
  Problem: Activity: Goal: Capacity to carry out activities will improve Outcome: Progressing   Problem: Clinical Measurements: Goal: Respiratory complications will improve Outcome: Progressing   

## 2023-10-14 NOTE — Progress Notes (Signed)
Patient dc home via wheelchair in stable condition.

## 2023-10-14 NOTE — Discharge Summary (Addendum)
Physician Discharge Summary  [name] ZOX:[id number] DOB: Apr 28, 1946 DOA: 10/08/2023  PCP: Creola Corn, MD  Admit date: 10/08/2023 Discharge date: 10/14/2023  Time spent: 45 minutes  Recommendations for Outpatient Follow-up:  [facility] heart care on 11/25, please check BMP at follow-up Needs outpatient sleep study   Discharge Diagnoses:  Acute on chronic diastolic CHF Hallucinations   Essential hypertension   Persistent atrial fibrillation (HCC)   Acute on chronic diastolic heart failure (HCC)   CKD stage 3b, GFR 30-44 ml/min (HCC)   Prolonged QT interval   Hypothyroidism   Acute on chronic systolic CHF (congestive heart failure) (HCC)   Increased ammonia level   Metabolic encephalopathy   HFrEF (heart failure with reduced ejection fraction) (HCC)   Malnutrition of moderate degree   Discharge Condition: Improved  Diet recommendation: Low-sodium, heart healthy  Filed Weights   10/11/23 0419 10/12/23 0416 10/14/23 0035  Weight: 91.8 kg 92.1 kg 92 kg    History of present illness:  Chronically ill morbidly obese female with history of chronic systolic CHF, moderately reduced RV, persistent A-fib, history of pericardial effusion status post pericardiocentesis 2/22, CKD 3 A, hypertension, obesity, hypothyroidism presented to the ED with confusion and edema. -Reportedly was having hallucinations and confusion, Lasix was changed to Bumex. -PCP started her on trazodone after this suspecting insomnia induced delirium -Had worsening agitation and sundowning with cough for last few weeks, along with progressive dyspnea on exertion, in the ED mildly tachycardic at the [facility], labs with sodium 140, creatinine 1.6, BNP 1027, WBC 7.4, troponin 34, ammonia level 80 -Admitted, started on diuretics and lactulose  Hospital Course:   Acute on chronic systolic CHF -Last echo 9/28 with a EF down to 30-35% with moderately reduced RV -Cardiology consulting - improving on diuretics, weight down 8  LB, switch to oral Lasix today, continue metoprolol -Poor candidate for SGLT2i with recent delirium, obesity and limited mobility etc., allergic to aldactone -Instructions to take additional Lasix 40 Mg for weight gain -Follow-up with [facility] heart care on 11/25   Metabolic encephalopathy, hallucination -Now improving, likely multifactorial, combination of elevated ammonia, delirium -elevated ammonia likely from RV failure, hepatic congestion -CT, MRI brain were unremarkable, UA benign, B12 and TSH are normal -Could have untreated sleep apnea as well -Cut down lactulose, mental status is better, trazodone discontinued per family request - -continue PT OT, much improved at discharge   Persistent atrial fibrillation Continue Toprol and Eliquis   CKD 3B -Stable, monitor with diuresis   Hypothyroidism Continue Synthroid, TSH is normal   Suspected sleep apnea -She desats at night, overnight pulse oximetry set up at home to see if she would qualify for nocturnal O2 -Recommend sleep study as outpatient    Discharge Exam: Vitals:   10/14/23 0433 10/14/23 0720  BP: 134/74 (!) 128/112  Pulse: 81 91  Resp: 18 18  Temp: (!) 97.5 F (36.4 C) 98.1 F (36.7 C)  SpO2: 95% 93%   General exam: Obese chronically ill female sitting up in the recliner, AAOx3 HEENT: Neck obese CVS: S1-S2, regular rhythm Lungs: Decreased breath sounds the bases Abdomen: Soft, obese, nontender, bowel sounds present Extremities: 1+ edema  Skin: No rashes Psychiatry:  Mood & affect appropriate  Discharge Instructions   Discharge Instructions     Diet - low sodium heart healthy   Complete by: As directed    Increase activity slowly   Complete by: As directed    No wound care   Complete by: As directed  Allergies as of 10/14/2023       Reactions   Spironolactone Other (See Comments)   Hyponatremia-  the level of sodium in blood is too high   Atorvastatin Other (See Comments)   "Aches"         Medication List     STOP taking these medications    bumetanide 1 MG tablet Commonly known as: Bumex   hydrALAZINE 10 MG tablet Commonly known as: APRESOLINE       TAKE these medications    acetaminophen 325 MG tablet Commonly known as: TYLENOL Take 325-650 mg by mouth daily as needed for mild pain or headache.   CALTRATE 600+D3 PO Take 1 tablet by mouth every evening.   CORICIDIN D PO Take 2 capsules by mouth daily.   [employer] Iron 325 (65 Fe) MG tablet Generic drug: ferrous sulfate Take 325 mg by mouth daily at 6 PM.   cyanocobalamin 1000 MCG tablet Commonly known as: VITAMIN B12 Take 1,000 mcg by mouth daily.   digoxin 0.125 MG tablet Commonly known as: LANOXIN Take 1 tablet (0.125 mg total) by mouth daily.   Eliquis 5 MG Tabs tablet Generic drug: apixaban TAKE 1 TABLET BY MOUTH TWICE A DAY What changed: how much to take   furosemide 40 MG tablet Commonly known as: LASIX Take 1 tablet (40 mg total) by mouth daily. Sliding scale Lasix: Weigh yourself when you get home, then Daily in the Morning. Your dry weight will be what your scale says on the day you return home.  If you gain more than 3 pounds from dry weight: Increase the Lasix dosing to 40 mg in the morning and 40 mg in the afternoon until weight returns to baseline dry weight.  If weight gain is greater than 5 pounds in 2 days: Increased to Lasix 80 mg in AM & 40 mg PM x 1 day & reassess weight on Day 2.  If no improvement, contact the office for further assistance if weight does not go down the next day.  If the weight goes down more than 3 pounds from dry weight: Hold Lasix until it returns to baseline dry weight   isosorbide mononitrate 30 MG 24 hr tablet Commonly known as: IMDUR Take 1 tablet (30 mg total) by mouth daily.   lactulose 10 GM/15ML solution Commonly known as: CHRONULAC Take 15 mLs (10 g total) by mouth every other day.   levothyroxine 125 MCG tablet Commonly known as:  SYNTHROID Take 125 mcg by mouth daily before breakfast.   metoprolol succinate 100 MG 24 hr tablet Commonly known as: TOPROL-XL Take 1 tablet (100 mg total) by mouth daily. Take with or immediately following a meal.   omeprazole 20 MG capsule Commonly known as: PRILOSEC TAKE 1 CAPSULE BY MOUTH EVERY DAY What changed:  how much to take when to take this   potassium chloride SA 20 MEQ tablet Commonly known as: KLOR-CON M Take 1 tablet (20 mEq total) by mouth daily.   pravastatin 80 MG tablet Commonly known as: PRAVACHOL Take 80 mg by mouth in the morning.   SINEX ULTRA FINE MIST 12-HOUR NA Place 1 spray into both nostrils every 12 (twelve) hours as needed (for congestion).   traZODone 50 MG tablet Commonly known as: DESYREL Take 50 mg by mouth daily.   Visine 0.025-0.3 % ophthalmic solution Generic drug: naphazoline-pheniramine Place 1 drop into both eyes 2 (two) times daily as needed for eye irritation.       Allergies  Allergen Reactions   Spironolactone Other (See Comments)    Hyponatremia-  the level of sodium in blood is too high   Atorvastatin Other (See Comments)    "Aches"     Follow-up Information     Creola Corn, MD Follow up.   Specialty: Internal Medicine Contact information: [address] [place] [address] [phone]                  The results of significant diagnostics from this hospitalization (including imaging, microbiology, ancillary and laboratory) are listed below for reference.    Significant Diagnostic Studies: US Abdomen Limited RUQ (LIVER/GB)  Result Date: 10/09/2023 CLINICAL DATA:  Increased ammonia level EXAM: ULTRASOUND ABDOMEN LIMITED RIGHT UPPER QUADRANT COMPARISON:  None Available. FINDINGS: Gallbladder: Multiple stones within the gallbladder measuring up 21.1 cm. No wall thickening or sonographic Murphy sign. Common bile duct: Diameter: Normal caliber, 3 mm Liver: No focal lesion identified. Within normal  limits in parenchymal echogenicity. Portal vein is patent on color Doppler imaging with normal direction of blood flow towards the liver. Other: Right pleural effusion noted IMPRESSION: Cholelithiasis.  No sonographic evidence of acute cholecystitis. Right pleural effusion. Electronically Signed   By: Charlett Nose M.D.   On: 10/09/2023 00:33   MR BRAIN WO CONTRAST  Result Date: 10/08/2023 CLINICAL DATA:  Delirium EXAM: MRI HEAD WITHOUT CONTRAST TECHNIQUE: Multiplanar, multiecho pulse sequences of the brain and surrounding structures were obtained without intravenous contrast. COMPARISON:  None Available. FINDINGS: Brain: No acute infarct, mass effect or extra-axial collection. No chronic microhemorrhage or siderosis. There is multifocal hyperintense T2-weighted signal within the white matter. Generalized volume loss. The midline structures are normal. Vascular: Normal flow voids Skull and upper cervical spine: Normal marrow signal Sinuses/Orbits: Right mastoid effusion. Paranasal sinuses are clear. Normal orbits. Other: None IMPRESSION: 1. No acute intracranial abnormality. 2. Chronic small vessel ischemia and volume loss. Electronically Signed   By: Deatra Robinson M.D.   On: 10/08/2023 23:50   CT HEAD WO CONTRAST  Result Date: 10/08/2023 CLINICAL DATA:  Altered level of consciousness, confusion, hallucinations EXAM: CT HEAD WITHOUT CONTRAST TECHNIQUE: Contiguous axial images were obtained from the base of the skull through the vertex without intravenous contrast. RADIATION DOSE REDUCTION: This exam was performed according to the departmental dose-optimization program which includes automated exposure control, adjustment of the mA and/or kV according to patient size and/or use of iterative reconstruction technique. COMPARISON:  09/27/2023 FINDINGS: Brain: No evidence of acute infarct or hemorrhage. Lateral ventricles and midline structures are stable. No acute extra-axial fluid collections. No mass effect.  Vascular: No hyperdense vessel or unexpected calcification. Skull: Normal. Negative for fracture or focal lesion. Sinuses/Orbits: No acute finding.  Stable right mastoid effusion. Other: None. IMPRESSION: 1. Stable head CT, no acute intracranial process. Electronically Signed   By: Sharlet Salina M.D.   On: 10/08/2023 18:20   DG Chest 2 View  Result Date: 10/08/2023 CLINICAL DATA:  Shortness of breath. EXAM: CHEST - 2 VIEW COMPARISON:  September 26, 2023. FINDINGS: Stable cardiomediastinal silhouette. Stable reticular densities are noted throughout both lungs which may represent scarring, but acute superimposed edema or atypical inflammation cannot be excluded. Bony thorax is unremarkable. IMPRESSION: Stable reticular densities are noted throughout both lungs which may represent scarring, but acute superimposed edema or atypical inflammation cannot be excluded. Aortic Atherosclerosis (ICD10-I70.0). Electronically Signed   By: Lupita Raider M.D.   On: 10/08/2023 16:55   CT HEAD WO CONTRAST ( )  Result Date: 09/27/2023  CLINICAL DATA:  77 year old female with confusion, altered mental status. EXAM: CT HEAD WITHOUT CONTRAST TECHNIQUE: Contiguous axial images were obtained from the base of the skull through the vertex without intravenous contrast. RADIATION DOSE REDUCTION: This exam was performed according to the departmental dose-optimization program which includes automated exposure control, adjustment of the mA and/or kV according to patient size and/or use of iterative reconstruction technique. COMPARISON:  Report of brain MRI 12/16/2000 (no images available). FINDINGS: Brain: Cerebral volume overall within normal limits for age. No midline shift, ventriculomegaly, mass effect, evidence of mass lesion, intracranial hemorrhage or evidence of cortically based acute infarction. Mild for age patchy white matter hypodensity, more apparent in the left hemisphere. Otherwise preserved gray-white differentiation.  No cortical encephalomalacia identified. Vascular: No suspicious intracranial vascular hyperdensity. Calcified atherosclerosis at the skull base. Skull: Hyperostosis of the calvarium. TMJ degeneration. No acute osseous abnormality identified. Sinuses/Orbits: Mild right mastoid effusion. Other visualized paranasal sinuses and mastoids are clear. Right tympanic cavity is clear. Negative visible nasopharynx. Other: Visualized orbits and scalp soft tissues are within normal limits. IMPRESSION: 1. No acute intracranial abnormality. 2. Mild for age cerebral white matter changes, most commonly due to chronic small vessel disease. 3. Mild right mastoid effusion, most often postinflammatory. Electronically Signed   By: Odessa Fleming M.D.   On: 09/27/2023 06:01   DG Chest 2 View  Result Date: 09/26/2023 CLINICAL DATA:  Confusion, on Lasix. EXAM: CHEST - 2 VIEW COMPARISON:  Chest x-ray 08/28/2023 FINDINGS: There some patchy airspace opacities in the left lower lung. There is no pleural effusion or pneumothorax. Heart is enlarged. There central pulmonary vascular congestion. No acute fractures are seen. IMPRESSION: 1. Patchy airspace opacities in the left lower lung may represent atelectasis or pneumonia. 2. Cardiomegaly with central pulmonary vascular congestion. Electronically Signed   By: Darliss Cheney M.D.   On: 09/26/2023 23:12    Microbiology: No results found for this or any previous visit (from the past 240 hour(s)).   Labs: Basic Metabolic Panel: Recent Labs  Lab 10/08/23 1533 10/08/23 2142 10/09/23 0555 10/10/23 0338 10/11/23 0227 10/12/23 0311 10/13/23 2150 10/14/23 0407  NA  --   --  140 140 139 139 135 137  K  --   --  4.1 3.4* 4.5 3.7 3.9 3.7  CL  --   --  100 98 98 100 99 98  CO2  --   --  30 27 30 29 28 29   GLUCOSE  --   --  112* 100* 108* 98 106* 89  BUN  --   --  23 22 21  24* 36* 32*  CREATININE  --   --  1.55* 1.48* 1.57* 1.59* 1.73* 1.70*  CALCIUM  --   --  9.1 8.8* 9.5 8.9 9.1 8.8*   MG 2.0  --  1.9  --   --   --   --   --   PHOS  --  3.5 3.8  --   --   --   --   --    Liver Function Tests: Recent Labs  Lab 10/08/23 1415 10/09/23 0555  AST 20 28  ALT 15 16  ALKPHOS 42 39  BILITOT 0.7 1.1  PROT 6.3* 6.1*  ALBUMIN 3.4* 3.3*   No results for input(s): "LIPASE", "AMYLASE" in the last 168 hours. Recent Labs  Lab 10/08/23 1631 10/09/23 0800  AMMONIA 80* 12   CBC: Recent Labs  Lab 10/08/23 1415 10/09/23 0443 10/11/23 0227  WBC 7.4  8.2 8.7  HGB 13.8 14.5 14.3  HCT 44.5 46.1* 46.1*  MCV 84.9 85.2 84.6  PLT 274 168 252   Cardiac Enzymes: Recent Labs  Lab 10/08/23 2142  CKTOTAL 44   BNP: BNP (last 3 results) Recent Labs    08/28/23 1336 10/08/23 1533  BNP 1,037.6* 1,027.2*    ProBNP (last 3 results) Recent Labs    09/24/23 1557  PROBNP 17,553*    CBG: Recent Labs  Lab 10/08/23 1556  GLUCAP 102*       Signed:  Zannie Cove MD.  [facility] 10/14/2023, 9:33 AM

## 2023-10-14 NOTE — TOC Transition Note (Signed)
Transition of Care [facility]) - CM/SW Discharge Note   Patient Details  Name: [name] MRN: [id number] Date of Birth: 1946/05/22  Transition of Care [facility]) CM/SW Contact:  Kermit Balo, RN Phone Number: 10/14/2023, 10:56 AM   Clinical Narrative:     Pt is discharging home with home health services through [facility] home health. Information on the AVS. [facility] will contact her for the first home visit. MD inquiring about oxygen for home at night. She would need an overnight study and sats have to remain at qualifying limit for a few minutes. MD updated. Outpatient over night study ordered through [facility]. They will follow up with the patient to arrange the overnight test.  DME at home: rollator/ shower seat/ cane Pt denies issues with home medications.  Spouse provides needed transportation and will transport home today.  Final next level of care: Home w Home Health Services Barriers to Discharge: No Barriers Identified   Patient Goals and CMS Choice CMS Medicare.gov Compare Post Acute Care list provided to:: Patient Choice offered to / list presented to : Patient  Discharge Placement                         Discharge Plan and Services Additional resources added to the After Visit Summary for       Post Acute Care Choice: Skilled Nursing Facility, Home Health                    HH Arranged: PT, OT [facility] Agency: [facility] Date [facility] Agency Contacted: 10/14/23   Representative spoke with at [facility] Agency: Tresa Endo  Social Determinants of Health (SDOH) Interventions SDOH Screenings   Food Insecurity: No Food Insecurity (10/08/2023)  Housing: Patient Declined (10/08/2023)  Transportation Needs: No Transportation Needs (10/08/2023)  Utilities: Not At Risk (10/08/2023)  Tobacco Use: Low Risk  (10/09/2023)     Readmission Risk Interventions     No data to display

## 2023-10-14 NOTE — Progress Notes (Addendum)
Discharge instructions reviewed with pt and her husband, both verbalized understanding and able to teach back. IV site to L AC removed, site unremarkable, cath intact.  Copy of instructions given to pt, pt informed her scripts were sent to her pharmacy for pick up.  Pt currently in the bathroom/on [facility] and will get dressed to go home.  After pt dressed pt will be d/c'd via wheelchair with belongings, with her husband and will be escorted by staff/hospital volunteer.   Annice Needy, RN SWOT

## 2023-10-14 NOTE — Progress Notes (Signed)
Mobility Specialist Progress Note:    10/14/23 0940  Mobility  Activity Ambulated with assistance in hallway  Level of Assistance Contact guard assist, steadying assist  Assistive Device Four wheel walker  Distance Ambulated (ft) 300 ft  Activity Response Tolerated well  Mobility Referral Yes  $Mobility charge 1 Mobility  Mobility Specialist Start Time (ACUTE ONLY) 0920  Mobility Specialist Stop Time (ACUTE ONLY) 0933  Mobility Specialist Time Calculation (min) (ACUTE ONLY) 13 min   Received pt in chair having no complaints and agreeable to mobility. Pt was asymptomatic throughout ambulation and returned to room w/o fault. Left in chair w/ call bell in reach and all needs met.   Thompson Grayer Mobility Specialist  Please contact vis Secure Chat or  Rehab Office [phone]

## 2023-10-15 DIAGNOSIS — Z6831 Body mass index (BMI) 31.0-31.9, adult: Secondary | ICD-10-CM | POA: Diagnosis not present

## 2023-10-15 DIAGNOSIS — Z7901 Long term (current) use of anticoagulants: Secondary | ICD-10-CM | POA: Diagnosis not present

## 2023-10-15 DIAGNOSIS — Z860100 Personal history of colon polyps, unspecified: Secondary | ICD-10-CM | POA: Diagnosis not present

## 2023-10-15 DIAGNOSIS — I272 Pulmonary hypertension, unspecified: Secondary | ICD-10-CM | POA: Diagnosis not present

## 2023-10-15 DIAGNOSIS — G9341 Metabolic encephalopathy: Secondary | ICD-10-CM | POA: Diagnosis not present

## 2023-10-15 DIAGNOSIS — D631 Anemia in chronic kidney disease: Secondary | ICD-10-CM | POA: Diagnosis not present

## 2023-10-15 DIAGNOSIS — Z96652 Presence of left artificial knee joint: Secondary | ICD-10-CM | POA: Diagnosis not present

## 2023-10-15 DIAGNOSIS — E039 Hypothyroidism, unspecified: Secondary | ICD-10-CM | POA: Diagnosis not present

## 2023-10-15 DIAGNOSIS — I5043 Acute on chronic combined systolic (congestive) and diastolic (congestive) heart failure: Secondary | ICD-10-CM | POA: Diagnosis not present

## 2023-10-15 DIAGNOSIS — E440 Moderate protein-calorie malnutrition: Secondary | ICD-10-CM | POA: Diagnosis not present

## 2023-10-15 DIAGNOSIS — N1832 Chronic kidney disease, stage 3b: Secondary | ICD-10-CM | POA: Diagnosis not present

## 2023-10-15 DIAGNOSIS — I130 Hypertensive heart and chronic kidney disease with heart failure and stage 1 through stage 4 chronic kidney disease, or unspecified chronic kidney disease: Secondary | ICD-10-CM | POA: Diagnosis not present

## 2023-10-15 DIAGNOSIS — E049 Nontoxic goiter, unspecified: Secondary | ICD-10-CM | POA: Diagnosis not present

## 2023-10-15 DIAGNOSIS — Z9981 Dependence on supplemental oxygen: Secondary | ICD-10-CM | POA: Diagnosis not present

## 2023-10-15 DIAGNOSIS — I4819 Other persistent atrial fibrillation: Secondary | ICD-10-CM | POA: Diagnosis not present

## 2023-10-15 DIAGNOSIS — M8580 Other specified disorders of bone density and structure, unspecified site: Secondary | ICD-10-CM | POA: Diagnosis not present

## 2023-10-15 DIAGNOSIS — E785 Hyperlipidemia, unspecified: Secondary | ICD-10-CM | POA: Diagnosis not present

## 2023-10-21 ENCOUNTER — Ambulatory Visit: Payer: Medicare HMO | Admitting: Nurse Practitioner

## 2023-10-21 DIAGNOSIS — N1831 Chronic kidney disease, stage 3a: Secondary | ICD-10-CM | POA: Diagnosis not present

## 2023-10-21 DIAGNOSIS — D6869 Other thrombophilia: Secondary | ICD-10-CM | POA: Diagnosis not present

## 2023-10-21 DIAGNOSIS — I700 Atherosclerosis of aorta: Secondary | ICD-10-CM | POA: Diagnosis not present

## 2023-10-21 DIAGNOSIS — G4734 Idiopathic sleep related nonobstructive alveolar hypoventilation: Secondary | ICD-10-CM | POA: Diagnosis not present

## 2023-10-21 DIAGNOSIS — R600 Localized edema: Secondary | ICD-10-CM | POA: Diagnosis not present

## 2023-10-21 DIAGNOSIS — I5022 Chronic systolic (congestive) heart failure: Secondary | ICD-10-CM | POA: Diagnosis not present

## 2023-10-21 DIAGNOSIS — E669 Obesity, unspecified: Secondary | ICD-10-CM | POA: Diagnosis not present

## 2023-10-21 DIAGNOSIS — I480 Paroxysmal atrial fibrillation: Secondary | ICD-10-CM | POA: Diagnosis not present

## 2023-10-21 DIAGNOSIS — I272 Pulmonary hypertension, unspecified: Secondary | ICD-10-CM | POA: Diagnosis not present

## 2023-10-21 DIAGNOSIS — R443 Hallucinations, unspecified: Secondary | ICD-10-CM | POA: Diagnosis not present

## 2023-10-21 DIAGNOSIS — I130 Hypertensive heart and chronic kidney disease with heart failure and stage 1 through stage 4 chronic kidney disease, or unspecified chronic kidney disease: Secondary | ICD-10-CM | POA: Diagnosis not present

## 2023-10-23 DIAGNOSIS — Z79899 Other long term (current) drug therapy: Secondary | ICD-10-CM | POA: Diagnosis not present

## 2023-10-23 DIAGNOSIS — R7989 Other specified abnormal findings of blood chemistry: Secondary | ICD-10-CM | POA: Diagnosis not present

## 2023-10-23 DIAGNOSIS — N1831 Chronic kidney disease, stage 3a: Secondary | ICD-10-CM | POA: Diagnosis not present

## 2023-10-23 DIAGNOSIS — I5022 Chronic systolic (congestive) heart failure: Secondary | ICD-10-CM | POA: Diagnosis not present

## 2023-10-23 DIAGNOSIS — I130 Hypertensive heart and chronic kidney disease with heart failure and stage 1 through stage 4 chronic kidney disease, or unspecified chronic kidney disease: Secondary | ICD-10-CM | POA: Diagnosis not present

## 2023-10-27 ENCOUNTER — Encounter: Payer: Self-pay | Admitting: Student

## 2023-10-27 ENCOUNTER — Ambulatory Visit: Payer: Medicare HMO | Attending: Cardiology | Admitting: Student

## 2023-10-27 ENCOUNTER — Encounter: Payer: Self-pay | Admitting: Pharmacist

## 2023-10-27 VITALS — BP 110/79 | HR 84 | Wt 197.0 lb

## 2023-10-27 DIAGNOSIS — I5032 Chronic diastolic (congestive) heart failure: Secondary | ICD-10-CM

## 2023-10-27 DIAGNOSIS — I5020 Unspecified systolic (congestive) heart failure: Secondary | ICD-10-CM

## 2023-10-27 MED ORDER — EMPAGLIFLOZIN 10 MG PO TABS: 10.0000 mg | ORAL_TABLET | Freq: Every day | ORAL | 11 refills | Status: DC

## 2023-10-27 NOTE — Patient Instructions (Signed)
Changes made by your pharmacist Carmela Hurt, PharmD at today's visit:    Instructions/Changes  (what do you need to do) Your Notes  (what you did and when you did it)  Start taking Jardiance 10 mg daily and continue taking metoprolol Xl 100 mg daily     Bring all of your meds, your BP cuff and your record of home blood pressures to your next appointment.    HOW TO TAKE YOUR BLOOD PRESSURE AT HOME  Rest 5 minutes before taking your blood pressure.  Don't smoke or drink caffeinated beverages for at least 30 minutes before. Take your blood pressure before (not after) you eat. Sit comfortably with your back supported and both feet on the floor (don't cross your legs). Elevate your arm to heart level on a table or a desk. Use the proper sized cuff. It should fit smoothly and snugly around your bare upper arm. There should be enough room to slip a fingertip under the cuff. The bottom edge of the cuff should be 1 inch above the crease of the elbow. Ideally, take 3 measurements at one sitting and record the average.  Important lifestyle changes to control high blood pressure  Intervention  Effect on the BP  Lose extra pounds and watch your waistline Weight loss is one of the most effective lifestyle changes for controlling blood pressure. If you're overweight or obese, losing even a small amount of weight can help reduce blood pressure. Blood pressure might go down by about 1 millimeter of mercury (mm Hg) with each kilogram (about 2.2 pounds) of weight lost.  Exercise regularly As a general goal, aim for at least 30 minutes of moderate physical activity every day. Regular physical activity can lower high blood pressure by about 5 to 8 mm Hg.  Eat a healthy diet Eating a diet rich in whole grains, fruits, vegetables, and low-fat dairy products and low in saturated fat and cholesterol. A healthy diet can lower high blood pressure by up to 11 mm Hg.  Reduce salt (sodium) in your diet Even a  small reduction of sodium in the diet can improve heart health and reduce high blood pressure by about 5 to 6 mm Hg.  Limit alcohol One drink equals 12 ounces of beer, 5 ounces of wine, or 1.5 ounces of 80-proof liquor.  Limiting alcohol to less than one drink a day for women or two drinks a day for men can help lower blood pressure by about 4 mm Hg.   If you have any questions or concerns please use My Chart to send questions or call the office at [phone]

## 2023-10-27 NOTE — Assessment & Plan Note (Signed)
Assessment: BP is controlled in office BP 110/79 mmHg heart rate 84 (goal<130/80) Takes and tolerates metoprolol well Denies SOB,palpitation,dizziness, lightheadedness or swelling  BMP at PCP - K level elevated - k supplement was stopped still waiting on f/u lab's result from PCP Recent BMP at PCP SrCr 1.5 - improved eGFR has improved  >30 mL/min can add Jardiance to beta-blocker  Reiterated the importance of regular exercise and low salt diet   Plan:  Start taking Jardiance 10 mg daily  Continue taking metoprolol succinate 100 mg daily  Patient to keep record of BP readings with heart rate and report to [name] at the next visit Patient to see PharmD in 5 weeks for follow up  Follow up lab(s): BMP in 2 week on Dec 10

## 2023-10-27 NOTE — Progress Notes (Unsigned)
Patient ID: [name]                 DOB: 07/19/1946                      MRN: [id number]     HPI: [name] is a 77 y.o. female referred by Dr. Rosemary Holms  to pharmacy clinic for HF medication management. PMH is significant for  hypertension, persistent atrial fibrillation, moderate obesity, pericardial effusion s/p pericardiocentesis (01/2021), new diagnosis HFrEF, severe WHO Grp II PH (08/2023) . Most recent LVEF 30-35%  on 09/01/2023.   Patient recently had recurrent hospitalization with shortness of breath, hyponatremia.  She was found to have new diagnosis of HFrEF, with EF of 30-35%, nonischemic cardiomyopathy, severe pulmonary hypertension with mPAP 47 mmHg, WHO group 2 with LVEDP of 26 mmHg.  Hyponatremia was thought to be delusional in the setting of heart failure.  She was diuresed with improvement in her symptoms of dyspnea and leg edema.uptitration of GDMT has been limited for following reasons.Hyponatremia precluding use of spironolactone. Renal function with CKD 3 A/P precluding or limiting ARNI/SGLT2i use. Last visit with Dr.Patwardhan lasix dose was increased from 20 mg once daily to 20 mg twice daily and metoprolol tartrate was changed to metoprolol succinate. Her BP at the last visit 130/90 and she is also in Imdur 30 mg daily  Patient was hospitalized early November for Acute on chronic HF and delirium.  Patient presented today wit her daughter.reports she has been feeling great since she has been taking lactulose. She is no longer hallucinating and she feels energetic. Her weight has been stable around ~195 lbs. They went to PCP BMP lab was done on 11/19 -BMP -  SrCr 1.5, BUN 18, Na 137, K 6.1,Cl 102, Co2 31, CA 9.7 - potassium was elevated so supplement was stopped and her renal function has improved and other electrolytes were WNL. After stopping potassium patient went for follow up lab on Nov 21  and still waiting on result. Patient brought in her home BP monitors for  validation, one that she uses regularly was inaccurate in comparison to  office reading. But the Omron brand was very close to office reading so advised pt to use Omron brand. Denies SOB,palpitation,dizziness, lightheadedness. We discussed HF medications  reasoning behind medication titration, importance of medication adherence, and patient engagement. Their MOA and common side effects and it's management and monitoring labs.      Current CHF meds: Metoprolol succinate 100 mg daily  Previously tried: hydralazine - nausea  Adherence Assessment  Do you ever forget to take your medication? [] Yes [x] No  Do you ever skip doses due to side effects? [] Yes [x] No  Do you have trouble affording your medicines? [] Yes [x] No  Are you ever unable to pick up your medication due to transportation difficulties? [] Yes [x] No  Do you ever stop taking your medications because you don't believe they are helping? [] Yes [x] No  Do you check your weight daily? [x] Yes [] No   Adherence strategy: pill box   Barriers to obtaining medications: none   BP goal: <130/80  Social History:  Alcohol: none  Smoking: never   Diet: low salt diet   Exercise: walks around the house using walker   Home BP readings: uses inaccurate BP monitor so it was ~130-135/70-80 range Wt Readings from Last 3 Encounters:  10/27/23 197 lb (89.4 kg)  10/14/23 202 lb 13.2 oz (92 kg)  09/24/23 211 lb 3.2 oz (  95.8 kg)   BP Readings from Last 3 Encounters:  10/27/23 110/79  10/14/23 107/74  09/27/23 (!) 160/105   Pulse Readings from Last 3 Encounters:  10/27/23 84  10/14/23 75  09/27/23 92    Renal function: Estimated Creatinine Clearance: 30.7 mL/min (A) (by C-G formula based on SCr of 1.7 mg/dL (H)).  Past Medical History:  Diagnosis Date   Arthritis    CHF (congestive heart failure) (HCC)    Chronic HFrEF (heart failure with reduced ejection fraction) (HCC)    CKD (chronic kidney disease), stage III (HCC)    Colon  polyps    Goiter    Hyperglycemia    Hyperlipidemia    Hypertension    Hypothyroidism    Morbid obesity (HCC)    Osteopenia    Permanent atrial fibrillation (HCC)     Current Outpatient Medications on File Prior to Visit  Medication Sig Dispense Refill   acetaminophen (TYLENOL) 325 MG tablet Take 325-650 mg by mouth daily as needed for mild pain or headache.     Calcium Carb-Cholecalciferol (CALTRATE 600+D3 PO) Take 1 tablet by mouth every evening.     Chlorphen-Pseudoephed-APAP (CORICIDIN D PO) Take 2 capsules by mouth daily.     [employer] IRON 325 (65 Fe) MG tablet Take 325 mg by mouth daily at 6 PM.     digoxin (LANOXIN) 0.125 MG tablet Take 1 tablet (0.125 mg total) by mouth daily. 30 tablet 0   ELIQUIS 5 MG TABS tablet TAKE 1 TABLET BY MOUTH TWICE A DAY (Patient taking differently: Take 5 mg by mouth 2 (two) times daily.) 60 tablet 5   furosemide (LASIX) 40 MG tablet Take 1 tablet (40 mg total) by mouth daily. Sliding scale Lasix: Weigh yourself when you get home, then Daily in the Morning. Your dry weight will be what your scale says on the day you return home.  If you gain more than 3 pounds from dry weight: Increase the Lasix dosing to 40 mg in the morning and 40 mg in the afternoon until weight returns to baseline dry weight.  If weight gain is greater than 5 pounds in 2 days: Increased to Lasix 80 mg in AM & 40 mg PM x 1 day & reassess weight on Day 2.  If no improvement, contact the office for further assistance if weight does not go down the next day.  If the weight goes down more than 3 pounds from dry weight: Hold Lasix until it returns to baseline dry weight 60 tablet 0   isosorbide mononitrate (IMDUR) 30 MG 24 hr tablet Take 1 tablet (30 mg total) by mouth daily. 90 tablet 3   lactulose (CHRONULAC) 10 GM/15ML solution Take 15 mLs (10 g total) by mouth every other day. 236 mL 0   levothyroxine (SYNTHROID, LEVOTHROID) 125 MCG tablet Take 125 mcg by mouth daily before breakfast.      metoprolol succinate (TOPROL-XL) 100 MG 24 hr tablet Take 1 tablet (100 mg total) by mouth daily. Take with or immediately following a meal. 90 tablet 3   omeprazole (PRILOSEC) 20 MG capsule TAKE 1 CAPSULE BY MOUTH EVERY DAY (Patient taking differently: Take 20 mg by mouth daily before breakfast.) 90 capsule 1   Oxymetazoline HCl (SINEX ULTRA FINE MIST 12-HOUR NA) Place 1 spray into both nostrils every 12 (twelve) hours as needed (for congestion).     potassium chloride SA (KLOR-CON M) 20 MEQ tablet Take 1 tablet (20 mEq total) by mouth daily. 30 tablet  0   pravastatin (PRAVACHOL) 80 MG tablet Take 80 mg by mouth in the morning.     VISINE 0.025-0.3 % ophthalmic solution Place 1 drop into both eyes 2 (two) times daily as needed for eye irritation.     vitamin B-12 (CYANOCOBALAMIN) 1000 MCG tablet Take 1,000 mcg by mouth daily.     No current facility-administered medications on file prior to visit.    Allergies  Allergen Reactions   Spironolactone Other (See Comments)    Hyponatremia-  the level of sodium in blood is too high   Atorvastatin Other (See Comments)    "Aches"      Assessment/Plan:   HFrEF (heart failure with reduced ejection fraction) (HCC) Assessment & Plan: Assessment: BP is controlled in office BP 110/79 mmHg heart rate 84 (goal<130/80) Takes and tolerates metoprolol well Denies SOB,palpitation,dizziness, lightheadedness or swelling  BMP at PCP - K level elevated - k supplement was stopped still waiting on f/u lab's result from PCP Recent BMP at PCP SrCr 1.5 - improved eGFR has improved  >30 mL/min can add Jardiance to beta-blocker  Reiterated the importance of regular exercise and low salt diet   Plan:  Start taking Jardiance 10 mg daily  Continue taking metoprolol succinate 100 mg daily  Patient to keep record of BP readings with heart rate and report to [name] at the next visit Patient to see PharmD in 5 weeks for follow up  Follow up lab(s): BMP in 2 week  on Dec 10     Chronic heart failure with preserved ejection fraction (HCC) -     Basic metabolic panel  Other orders -     Empagliflozin; Take 1 tablet (10 mg total) by mouth daily.  Dispense: 30 tablet; Refill: 11        Thank you   Carmela Hurt, Pharm.D [facility] A Division of [facility] [address] [place], [place] [address]  Phone: [phone]; Fax: [phone]

## 2023-11-05 ENCOUNTER — Encounter: Payer: Self-pay | Admitting: Cardiology

## 2023-11-10 ENCOUNTER — Other Ambulatory Visit: Payer: Self-pay | Admitting: *Deleted

## 2023-11-11 ENCOUNTER — Encounter: Payer: Self-pay | Admitting: Emergency Medicine

## 2023-11-11 ENCOUNTER — Ambulatory Visit: Payer: Medicare HMO | Attending: Physician Assistant | Admitting: Emergency Medicine

## 2023-11-11 VITALS — BP 132/72 | HR 65 | Ht 65.0 in | Wt 198.0 lb

## 2023-11-11 DIAGNOSIS — I2510 Atherosclerotic heart disease of native coronary artery without angina pectoris: Secondary | ICD-10-CM | POA: Diagnosis not present

## 2023-11-11 DIAGNOSIS — R410 Disorientation, unspecified: Secondary | ICD-10-CM

## 2023-11-11 DIAGNOSIS — N1832 Chronic kidney disease, stage 3b: Secondary | ICD-10-CM

## 2023-11-11 DIAGNOSIS — I4811 Longstanding persistent atrial fibrillation: Secondary | ICD-10-CM

## 2023-11-11 DIAGNOSIS — I10 Essential (primary) hypertension: Secondary | ICD-10-CM

## 2023-11-11 DIAGNOSIS — I5032 Chronic diastolic (congestive) heart failure: Secondary | ICD-10-CM | POA: Diagnosis not present

## 2023-11-11 DIAGNOSIS — I5020 Unspecified systolic (congestive) heart failure: Secondary | ICD-10-CM

## 2023-11-11 MED ORDER — DIGOXIN 125 MCG PO TABS: 0.1250 mg | ORAL_TABLET | Freq: Every day | ORAL | 1 refills | Status: DC

## 2023-11-11 NOTE — Progress Notes (Signed)
Cardiology Office Note:    Date:  11/11/2023  ID:  [name], DOB [birth year], MRN [id number] PCP: Creola Corn, MD  [facility] Providers Cardiologist:  Elder Negus, MD       Patient Profile:     Patricia Brazen.  [name] is a 77 year old female with history of chronic HFrEF, nonischemic cardiomyopathy, severe pulmonary hypertension group II, permanent atrial fibrillation, aortic stenosis, hyponatremia, CKD, hypertension, hyperlipidemia, obesity, pericardial effusion s/p pericardiocentesis 01/2021.   She was recently admitted to the hospital on October 2024 for for acute on chronic heart failure.  She previously had a normal EF however was noted to have EF 30-35% with mildly reduced RV function.  During this admission she underwent right and left heart catheterization that demonstrated mild to moderate diffuse disease in the left circumflex and the LAD but not hemodynamically significant.  She had moderate to severe pulmonary hypertension with pulmonary venous hypertension.  She was given IV diuretics but titration of GDMT was difficult due to underlying CKD.  She had significant hyponatremia thought to be related to excess water intake and possibly spironolactone so this was discontinued.  Additionally, outpatient she had been having significant hallucinations thought possibly related to her Lasix so she was transitioned to Bumex however as stated she had had decreased urinary output from this.  She was admitted on 10/09/2023 for evaluation of CHF exacerbation and hallucinations. She reported worsening peripheral edema and exertional shortness of breath. She sundown's frequently where she sees people that make her scared and nervous.  Her AMS likely due to ammonia level elevated to 80, her encephalopathy improved with lactulose.  She had a CT and MRI brain that did not show any significant findings.  BNP 1000+ and sodium had normalized this month.  During admission she did experience A-fib  with RVR with uncontrolled rates between 90 and 130.  Reluctant to increase beta-blocker with acute heart failure exacerbation and low output so digoxin 0.25 mg daily was added.  Was seen on follow-up by Pharm.D. on 10/27/2023 with her daughter.  Patient was no longer hallucinating and she feels energetic.  Her weight was stable.  She had her creatinine drawn by her PCP which was 1.5.  Where she was started on Jardiance 10 mg daily.      History of Present Illness:  Discussed the use of AI scribe software for clinical note transcription with the patient, who gave verbal consent to proceed.  [name] is a 77 y.o. female who returns for hospital follow-up regarding HFrEF.  History of Present Illness   Today, the patient's notes she is doing well from a cardiovascular standpoint.  The patient reports feeling better since the hospitalization and has not noticed any significant changes since starting new medications, including Jardiance and digoxin. The patient's family member notes that the patient has been significantly better since the last hospitalization.  Her leg swelling and shortness of breath has drastically improved. The patient also experienced hallucinations and confusion, which were attributed to elevated ammonia levels during the hospital stay. The patient was started on lactulose for this issue. However, the patient's primary care provider later reduced the frequency of lactulose administration. Since this change, the patient has reported seeing lights in the walls and experiencing fear at night, although she is not seeing people as she did during the previous hallucination episodes.  She is following with her PCP later today.  The patient's family member has been closely monitoring the patient's weight and blood  pressure at home. The patient's weight has decreased from 210 lbs in October to 197 lbs currently, and her discharge weight was 196 lbs. The patient's blood pressure readings at  home are mostly in the 120s to 130s range.     Review of Systems  Constitutional: Negative for weight gain and weight loss.  Cardiovascular:  Negative for chest pain, claudication, cyanosis, dyspnea on exertion, irregular heartbeat, leg swelling, near-syncope, orthopnea, palpitations, paroxysmal nocturnal dyspnea and syncope.  Respiratory:  Negative for cough, hemoptysis and shortness of breath.   Gastrointestinal:  Negative for abdominal pain, hematochezia and melena.  Genitourinary:  Negative for hematuria.     See HPI    Studies Reviewed:       Heart catheterization 09/01/2023 RECOMMENDATIONS: Patient likely requires additional IV diuresis.  Will return to [facility] for ongoing diuresis . Continue to titrate GDMT for CHF Consider rhythm control for A-fib with bundle branch block.  Diagnostic Dominance: Right  Echocardiogram 08/22/2023 1. Left ventricular ejection fraction, by estimation, is 30 to 35%. The  left ventricle has moderately decreased function. The left ventricle  demonstrates global hypokinesis. There is mild left ventricular  hypertrophy. Left ventricular diastolic  parameters are indeterminate.   2. Right ventricular systolic function is moderately reduced. The right  ventricular size is normal.   3. Left atrial size was severely dilated.   4. Right atrial size was severely dilated.   5. Moderate pleural effusion.   6. Trivial mitral valve regurgitation.   7. Tricuspid valve regurgitation is mild to moderate.   8. There is moderate calcification of the aortic valve. Aortic valve  regurgitation is mild. Mild aortic valve stenosis.   9. The inferior vena cava is dilated in size with >50% respiratory  variability, suggesting right atrial pressure of 8 mmHg.   CHA2DS2-VASc Score = 5   This indicates a 7.2% annual risk of stroke. The patient's score is based upon: CHF History: 1 HTN History: 1 Diabetes History: 0 Stroke History: 0 Vascular Disease  History: 0 Age Score: 2 Gender Score: 1            Physical Exam:   VS:  BP 132/72 (BP Location: Right Arm, Patient Position: Sitting)   Pulse 65   Ht 5\' 5"  (1.651 m)   Wt 198 lb (89.8 kg)   SpO2 99%   BMI 32.95 kg/m    Wt Readings from Last 3 Encounters:  11/11/23 198 lb (89.8 kg)  10/27/23 197 lb (89.4 kg)  10/14/23 202 lb 13.2 oz (92 kg)    Constitutional:      Appearance: Normal and healthy appearance.  HENT:     Head: Normocephalic.  Neck:     Vascular: JVD normal.  Pulmonary:     Effort: Pulmonary effort is normal.     Breath sounds: Normal breath sounds.  Chest:     Chest wall: Not tender to palpatation.  Cardiovascular:     PMI at left midclavicular line. Normal rate. Regular rhythm. Normal S1. Normal S2.      Murmurs: There is no murmur.     No gallop.  No click. No rub.  Pulses:    Intact distal pulses.  Edema:    Peripheral edema absent.  Musculoskeletal: Normal range of motion.     Cervical back: Normal range of motion and neck supple. Skin:    General: Skin is warm and dry.  Neurological:     General: No focal deficit present.  Mental Status: Alert, oriented to person, place, and time and oriented to person, place and time.  Psychiatric:        Attention and Perception: Attention and perception normal.        Mood and Affect: Mood normal.        Behavior: Behavior is cooperative.        Thought Content: Thought content normal.        Assessment and Plan:  Acute on chronic HFrEF -NYHA II, euvolemic and well compensated.  Discharge weight 196lbs, current weight 197lbs. -Admitted September 2024 found to have newly reduced EF 30-35%, mildly reduced RV function -R/L heart catheterization 09/01/23 demonstrated mild-moderate nonobstructive CAD with mod-severe pulmonary hypertension -Her GDMT limited by hyponatremia and CKD (GFR 31).  Spironolactone previously discontinued due to hyponatremia.  She seems to be tolerating Jardiance well. -Continue GDMT  Lasix 40 mg, Jardiance 10 mg, Toprol-XL 100 mg, Imdur 30 mg, sliding scale Lasix -Consider initiation of Entresto on 1 month follow-up -Repeat BMP and digoxin level today -Please continue logging daily weights at home  Permanent atrial fibrillation -Rate controlled today, heart rate 65bpm -Controlled, if symptomatic, no noted side effects from addition of digoxin -Continue digoxin 0.125 mg daily, metoprolol-XL 100 mg daily -Continue Eliquis 5 mg twice daily, does not meet dose reduction qualifications, no bleeding concerns or complaints  Nonobstructive CAD -Mild to moderate noted on catheterization involving left circumflex and LAD -Not on ASA with Eliquis. -Continue Toprol XL, pravastatin, Imdur  Chronic kidney disease stage IIIb -10/14/2023 creatinine 1.7 and GFR 31 -Continue to monitor, repeat BMP today  Hypertension -BP 132/72.  Well-controlled -Continue current antihypertensive therapy -Please continue logging blood pressures daily at home  Delirium with hallucinations -She is alert and oriented to person, place, and time today -Likely related to ammonia of 80 during hospital admission.  Currently working with PCP on dosing for lactulose          Dispo:  x3 weeks with PharmD and x2 months with Dr. Rosemary Holms Signed, Denyce Robert, NP

## 2023-11-11 NOTE — Patient Instructions (Signed)
Medication Instructions:  Your physician recommends that you continue on your current medications as directed. Please refer to the Current Medication list given to you today.  *If you need a refill on your cardiac medications before your next appointment, please call your pharmacy*   Lab Work: TODAY:  BMET & DIGOXIN LEVEL  If you have labs (blood work) drawn today and your tests are completely normal, you will receive your results only by: MyChart Message (if you have MyChart) OR A paper copy in the mail If you have any lab test that is abnormal or we need to change your treatment, we will call you to review the results.   Testing/Procedures: None ordered  Follow-Up: At [facility], you and your health needs are our priority.  As part of our continuing mission to provide you with exceptional heart care, we have created designated Provider Care Teams.  These Care Teams include your primary Cardiologist (physician) and Advanced Practice Providers (APPs -  Physician Assistants and Nurse Practitioners) who all work together to provide you with the care you need, when you need it.  We recommend signing up for the patient portal called "MyChart".  Sign up information is provided on this After Visit Summary.  MyChart is used to connect with patients for Virtual Visits (Telemedicine).  Patients are able to view lab/test results, encounter notes, upcoming appointments, etc.  Non-urgent messages can be sent to your provider as well.   To learn more about what you can do with MyChart, go to [email or web address].    Your next appointment:   As scheduled   Provider:       Other Instructions

## 2023-11-12 DIAGNOSIS — E7220 Disorder of urea cycle metabolism, unspecified: Secondary | ICD-10-CM | POA: Diagnosis not present

## 2023-11-12 DIAGNOSIS — Z0000 Encounter for general adult medical examination without abnormal findings: Secondary | ICD-10-CM | POA: Diagnosis not present

## 2023-11-12 DIAGNOSIS — R443 Hallucinations, unspecified: Secondary | ICD-10-CM | POA: Diagnosis not present

## 2023-11-12 LAB — BASIC METABOLIC PANEL
BUN/Creatinine Ratio: 11 — ABNORMAL LOW (ref 12–28)
BUN: 17 mg/dL (ref 8–27)
CO2: 24 mmol/L (ref 20–29)
Calcium: 9.7 mg/dL (ref 8.7–10.3)
Chloride: 98 mmol/L (ref 96–106)
Creatinine, Ser: 1.55 mg/dL — ABNORMAL HIGH (ref 0.57–1.00)
Glucose: 105 mg/dL — ABNORMAL HIGH (ref 70–99)
Potassium: 3.8 mmol/L (ref 3.5–5.2)
Sodium: 141 mmol/L (ref 134–144)
eGFR: 34 mL/min/{1.73_m2} — ABNORMAL LOW (ref >59)

## 2023-11-28 DIAGNOSIS — E049 Nontoxic goiter, unspecified: Secondary | ICD-10-CM | POA: Diagnosis not present

## 2023-11-28 DIAGNOSIS — D631 Anemia in chronic kidney disease: Secondary | ICD-10-CM | POA: Diagnosis not present

## 2023-11-28 DIAGNOSIS — E785 Hyperlipidemia, unspecified: Secondary | ICD-10-CM | POA: Diagnosis not present

## 2023-11-28 DIAGNOSIS — I5043 Acute on chronic combined systolic (congestive) and diastolic (congestive) heart failure: Secondary | ICD-10-CM | POA: Diagnosis not present

## 2023-11-28 DIAGNOSIS — N1832 Chronic kidney disease, stage 3b: Secondary | ICD-10-CM | POA: Diagnosis not present

## 2023-11-28 DIAGNOSIS — I4819 Other persistent atrial fibrillation: Secondary | ICD-10-CM | POA: Diagnosis not present

## 2023-11-28 DIAGNOSIS — E440 Moderate protein-calorie malnutrition: Secondary | ICD-10-CM | POA: Diagnosis not present

## 2023-11-28 DIAGNOSIS — I272 Pulmonary hypertension, unspecified: Secondary | ICD-10-CM | POA: Diagnosis not present

## 2023-11-28 DIAGNOSIS — E039 Hypothyroidism, unspecified: Secondary | ICD-10-CM | POA: Diagnosis not present

## 2023-11-28 DIAGNOSIS — I130 Hypertensive heart and chronic kidney disease with heart failure and stage 1 through stage 4 chronic kidney disease, or unspecified chronic kidney disease: Secondary | ICD-10-CM | POA: Diagnosis not present

## 2023-11-28 DIAGNOSIS — G9341 Metabolic encephalopathy: Secondary | ICD-10-CM | POA: Diagnosis not present

## 2023-12-04 ENCOUNTER — Ambulatory Visit: Payer: PPO | Attending: Cardiovascular Disease | Admitting: Pharmacist

## 2023-12-04 VITALS — BP 130/60 | HR 70 | Wt 196.0 lb

## 2023-12-04 DIAGNOSIS — I5033 Acute on chronic diastolic (congestive) heart failure: Secondary | ICD-10-CM

## 2023-12-04 DIAGNOSIS — I4819 Other persistent atrial fibrillation: Secondary | ICD-10-CM

## 2023-12-04 NOTE — Progress Notes (Signed)
 Patient ID: [name]                 DOB: [birth year]                      MRN: [id number]     HPI: [name] is a 78 y.o. female referred by Dr. Elmira  to pharmacy clinic for HF medication management. PMH is significant for  hypertension, persistent atrial fibrillation, moderate obesity, pericardial effusion s/p pericardiocentesis (01/2021), new diagnosis HFrEF, severe WHO Grp II PH (08/2023) . Most recent LVEF 30-35%  on 09/01/2023.  Patient recently had recurrent hospitalization with shortness of breath, hyponatremia.  She was found to have new diagnosis of HFrEF, with EF of 30-35%, nonischemic cardiomyopathy, severe pulmonary hypertension with mPAP 47 mmHg, WHO group 2 with LVEDP of 26 mmHg.  Hyponatremia was thought to be dilution in the setting of heart failure.  She was diuresed with improvement in her symptoms of dyspnea and leg edema. Uptitration of GDMT has been limited for following reasons:  Hyponatremia precluding use of spironolactone . Renal function with CKD 3 A/P precluding or limiting ARNI/ARB use.   Last visit with Dr.Patwardhan lasix  dose was increased from 20 mg once daily to 20 mg twice daily and metoprolol  tartrate was changed to metoprolol  succinate. Her BP at the last visit 130/90 and she is also in Imdur  30 mg daily   Patient was hospitalized early November for Acute on chronic HF and delirium.  Was last seen by PharmD 10/27/23. K supplements had been stopped due to elevated K on labs. Latest labs 12/10 show K 3.8 and scr stable at 1.55.  Na 141. OMRON BP cuff found to be accruate. She was started on Jardiance  10mg  daily.   Patient presents today accompanied by her daughter. She has been feeling great. Weight is able around 195-198lb. Her husband checks her BP and HR twice a day. HR ranges from 48-71, mainly in the 50's-60's. Her BP the last week has been in the 130's-140's but previous was 120's-130's. BP cuff has been validated. Digoxin  level was not drawn at  last visit. Daughter helps with medications.  We discussed HF medications  reasoning behind medication titration, importance of medication adherence, and patient engagement. Their MOA and common side effects and it's management and monitoring labs.   Current CHF meds: Metoprolol  succinate 100 mg daily, Jardiance  10mg  daily, digoxin  0.125mg  daily, furosemide  40mg  daily Previously tried: hydralazine  - nausea, spironolactone  (hyponatremia) Adherence Assessment  Do you ever forget to take your medication? [] Yes [x] No  Do you ever skip doses due to side effects? [] Yes [x] No  Do you have trouble affording your medicines? [] Yes [x] No  Are you ever unable to pick up your medication due to transportation difficulties? [] Yes [x] No  Do you ever stop taking your medications because you don't believe they are helping? [] Yes [x] No  Do you check your weight daily? [x] Yes [] No   Adherence strategy: pill box   Barriers to obtaining medications: none   BP goal: <130/80  Social History:  Alcohol : none  Smoking: never   Diet: low salt diet   Exercise: walks around the house using walker   Home BP readings: uses inaccurate BP monitor so it was ~130-135/70-80 range Wt Readings from Last 3 Encounters:  12/04/23 196 lb (88.9 kg)  11/11/23 198 lb (89.8 kg)  10/27/23 197 lb (89.4 kg)   BP Readings from Last 3 Encounters:  12/04/23 130/60  11/11/23 132/72  10/27/23  110/79   Pulse Readings from Last 3 Encounters:  12/04/23 70  11/11/23 65  10/27/23 84    Renal function: CrCl cannot be calculated (Patient's most recent lab result is older than the maximum 21 days allowed.).  Past Medical History:  Diagnosis Date   Arthritis    CHF (congestive heart failure) (HCC)    Chronic HFrEF (heart failure with reduced ejection fraction) (HCC)    CKD (chronic kidney disease), stage III (HCC)    Colon polyps    Goiter    Hyperglycemia    Hyperlipidemia    Hypertension    Hypothyroidism     Morbid obesity (HCC)    Osteopenia    Permanent atrial fibrillation (HCC)     Current Outpatient Medications on File Prior to Visit  Medication Sig Dispense Refill   Calcium  Carb-Cholecalciferol  (CALTRATE 600+D3 PO) Take 1 tablet by mouth every evening.     [facility] IRON 325 (65 Fe) MG tablet Take 325 mg by mouth daily at 6 PM.     digoxin  (LANOXIN ) 0.125 MG tablet Take 1 tablet (0.125 mg total) by mouth daily. 30 tablet 1   ELIQUIS  5 MG TABS tablet TAKE 1 TABLET BY MOUTH TWICE A DAY (Patient taking differently: Take 5 mg by mouth 2 (two) times daily.) 60 tablet 5   empagliflozin  (JARDIANCE ) 10 MG TABS tablet Take 1 tablet (10 mg total) by mouth daily. 30 tablet 11   furosemide  (LASIX ) 40 MG tablet Take 1 tablet (40 mg total) by mouth daily. Sliding scale Lasix : Weigh yourself when you get home, then Daily in the Morning. Your dry weight will be what your scale says on the day you return home.  If you gain more than 3 pounds from dry weight: Increase the Lasix  dosing to 40 mg in the morning and 40 mg in the afternoon until weight returns to baseline dry weight.  If weight gain is greater than 5 pounds in 2 days: Increased to Lasix  80 mg in AM & 40 mg PM x 1 day & reassess weight on Day 2.  If no improvement, contact the office for further assistance if weight does not go down the next day.  If the weight goes down more than 3 pounds from dry weight: Hold Lasix  until it returns to baseline dry weight 60 tablet 0   isosorbide  mononitrate (IMDUR ) 30 MG 24 hr tablet Take 1 tablet (30 mg total) by mouth daily. 90 tablet 3   levothyroxine  (SYNTHROID , LEVOTHROID) 125 MCG tablet Take 125 mcg by mouth daily before breakfast.     metoprolol  succinate (TOPROL -XL) 100 MG 24 hr tablet Take 1 tablet (100 mg total) by mouth daily. Take with or immediately following a meal. 90 tablet 3   omeprazole  (PRILOSEC) 20 MG capsule TAKE 1 CAPSULE BY MOUTH EVERY DAY (Patient taking differently: Take 20 mg by mouth daily  before breakfast.) 90 capsule 1   pravastatin  (PRAVACHOL ) 80 MG tablet Take 80 mg by mouth in the morning.     VISINE 0.025-0.3 % ophthalmic solution Place 1 drop into both eyes 2 (two) times daily as needed for eye irritation.     vitamin B-12 (CYANOCOBALAMIN ) 1000 MCG tablet Take 1,000 mcg by mouth daily.     acetaminophen  (TYLENOL ) 325 MG tablet Take 325-650 mg by mouth daily as needed for mild pain or headache.     lactulose  (CHRONULAC ) 10 GM/15ML solution Take 15 mLs (10 g total) by mouth every other day. (Patient not taking: Reported on  12/04/2023) 236 mL 0   Oxymetazoline HCl (SINEX ULTRA FINE MIST 12-HOUR NA) Place 1 spray into both nostrils every 12 (twelve) hours as needed (for congestion). (Patient not taking: Reported on 12/04/2023)     No current facility-administered medications on file prior to visit.    Allergies  Allergen Reactions   Spironolactone  Other (See Comments)    Hyponatremia-  the level of sodium in blood is too high   Atorvastatin Other (See Comments)    Aches      Assessment/Plan:   Acute on chronic diastolic heart failure [facility]) Assessment & Plan: Assessment: Patient doing well on current regimen. Appears euvolemic. Weight stable Denies SOB HR precludes increase in metoprolol  Kidney function is borderline for me comfortable starting ARB or ARNI Digoxin  dose recommended with IBW CrCl 15-30 would be 0.0625mg  daily (pt on 0.125mg  daily) Previous hyponatremia on spironolactone  Doing well on Jardiance  10mg   Plan: Check BMP and digoxin  level today Will most likely decrease digoxin  dose to 0.0625mg  daily Continue to monitor BP Follow up will be made depending on changes after lab   Orders: -     Basic metabolic panel -     Digoxin  level  Persistent atrial fibrillation (HCC) -     Basic metabolic panel -     Digoxin  level     Thank you   Eleanor JONETTA Crews, Pharm.D, BCACP, CPP [facility] of [facility] [address] [place], [place] [address]  Phone: [phone]; Fax: [phone]

## 2023-12-04 NOTE — Patient Instructions (Signed)
 We will check a digoxin level and a BMP today I will let you know tomorrow if there are any changes

## 2023-12-04 NOTE — Assessment & Plan Note (Signed)
 Assessment: Patient doing well on current regimen. Appears euvolemic. Weight stable Denies SOB HR precludes increase in metoprolol  Kidney function is borderline for me comfortable starting ARB or ARNI Digoxin  dose recommended with IBW CrCl 15-30 would be 0.0625mg  daily (pt on 0.125mg  daily) Previous hyponatremia on spironolactone  Doing well on Jardiance  10mg   Plan: Check BMP and digoxin  level today Will most likely decrease digoxin  dose to 0.0625mg  daily Continue to monitor BP Follow up will be made depending on changes after lab

## 2023-12-05 ENCOUNTER — Telehealth: Payer: Self-pay | Admitting: Pharmacist

## 2023-12-05 ENCOUNTER — Encounter: Payer: Self-pay | Admitting: Pharmacist

## 2023-12-05 DIAGNOSIS — I5020 Unspecified systolic (congestive) heart failure: Secondary | ICD-10-CM

## 2023-12-05 DIAGNOSIS — I4819 Other persistent atrial fibrillation: Secondary | ICD-10-CM

## 2023-12-05 LAB — DIGOXIN LEVEL: Digoxin, Serum: 1.8 ng/mL — ABNORMAL HIGH (ref 0.5–0.9)

## 2023-12-05 LAB — BASIC METABOLIC PANEL
BUN/Creatinine Ratio: 10 — ABNORMAL LOW (ref 12–28)
BUN: 15 mg/dL (ref 8–27)
CO2: 27 mmol/L (ref 20–29)
Calcium: 10.1 mg/dL (ref 8.7–10.3)
Chloride: 99 mmol/L (ref 96–106)
Creatinine, Ser: 1.5 mg/dL — ABNORMAL HIGH (ref 0.57–1.00)
Glucose: 102 mg/dL — ABNORMAL HIGH (ref 70–99)
Potassium: 4.3 mmol/L (ref 3.5–5.2)
Sodium: 142 mmol/L (ref 134–144)
eGFR: 35 mL/min/{1.73_m2} — ABNORMAL LOW (ref >59)

## 2023-12-05 NOTE — Telephone Encounter (Addendum)
 Digoxin  level is 1.7. this was drawn about 7hr after her digoxin  dose. I called and spoke with daughter and asked her to have patient hold digoxin . Advised if she has nausea, vomiting, hallucinations , confusion to take to ER.   Hold digoxin  and get a repeat level on Monday.

## 2023-12-09 DIAGNOSIS — I5020 Unspecified systolic (congestive) heart failure: Secondary | ICD-10-CM | POA: Diagnosis not present

## 2023-12-10 ENCOUNTER — Encounter: Payer: Self-pay | Admitting: Pharmacist

## 2023-12-10 LAB — DIGOXIN LEVEL: Digoxin, Serum: 0.7 ng/mL (ref 0.5–0.9)

## 2023-12-15 DIAGNOSIS — I5020 Unspecified systolic (congestive) heart failure: Secondary | ICD-10-CM | POA: Diagnosis not present

## 2023-12-15 DIAGNOSIS — I4819 Other persistent atrial fibrillation: Secondary | ICD-10-CM | POA: Diagnosis not present

## 2023-12-15 LAB — DIGOXIN LEVEL: Digoxin, Serum: 0.5 ng/mL (ref 0.5–0.9)

## 2023-12-16 ENCOUNTER — Encounter: Payer: Self-pay | Admitting: Pharmacist

## 2023-12-29 ENCOUNTER — Ambulatory Visit: Payer: PPO | Attending: Cardiology | Admitting: Pharmacist

## 2023-12-29 VITALS — BP 122/58 | HR 50 | Wt 193.8 lb

## 2023-12-29 DIAGNOSIS — I5033 Acute on chronic diastolic (congestive) heart failure: Secondary | ICD-10-CM

## 2023-12-29 DIAGNOSIS — I5020 Unspecified systolic (congestive) heart failure: Secondary | ICD-10-CM

## 2023-12-29 MED ORDER — DIGOXIN 125 MCG PO TABS: 0.0625 mg | ORAL_TABLET | ORAL | 3 refills | Status: DC

## 2023-12-29 NOTE — Progress Notes (Signed)
Patient ID: [name]                 DOB: [birth year]                      MRN: [id number]     HPI: [name] is a 78 y.o. female referred by Dr. Rosemary Holms  to pharmacy clinic for HF medication management. PMH is significant for  hypertension, persistent atrial fibrillation, moderate obesity, pericardial effusion s/p pericardiocentesis (01/2021), new diagnosis HFrEF, severe WHO Grp II PH (08/2023) . Most recent LVEF 30-35%  on 09/01/2023.  Patient recently had recurrent hospitalization with shortness of breath, hyponatremia.  She was found to have new diagnosis of HFrEF, with EF of 30-35%, nonischemic cardiomyopathy, severe pulmonary hypertension with mPAP 47 mmHg, WHO group 2 with LVEDP of 26 mmHg.  Hyponatremia was thought to be dilution in the setting of heart failure.  She was diuresed with improvement in her symptoms of dyspnea and leg edema. Uptitration of GDMT has been limited for following reasons:  Hyponatremia precluding use of spironolactone. Renal function with CKD 3 A/P precluding or limiting ARNI/ARB use.   Last visit with Dr.Patwardhan lasix dose was increased from 20 mg once daily to 20 mg twice daily and metoprolol tartrate was changed to metoprolol succinate. Her BP at the last visit 130/90 and she is also in Imdur 30 mg daily   Patient was hospitalized early November for Acute on chronic HF and delirium.  Was last seen by PharmD 10/27/23. K supplements had been stopped due to elevated K on labs. Latest labs 12/10 show K 3.8 and scr stable at 1.55.  Na 141. OMRON BP cuff found to be accruate. She was started on Jardiance 10mg  daily.   Was last seen in office 12/04/23. Her digoxin level was high and digoxin was held and then resumed at 0.0625mg  every other day. Repeat level was 0.5.   Patient presents today accompanied by her daughter. She is feeling good. Weight is stable 190-192lb. She has lost a few pounds since previous visit. Blood pressure is fairly stable. Mostly at  goal. 117/77-140/73. HR 60-70's. Very minimal swelling in right shin. Breathing is good. Still hearing some sounds that aren't there, but much improvement from before hospitalization.    We discussed HF medications  reasoning behind medication titration, importance of medication adherence, and patient engagement. Their MOA and common side effects and it's management and monitoring labs.   Current CHF meds: Metoprolol succinate 100 mg daily, Jardiance 10mg  daily, digoxin 0.0625 mg every other day, furosemide 40mg  daily Previously tried: hydralazine - nausea, spironolactone (hyponatremia) Adherence Assessment  Do you ever forget to take your medication? [] Yes [x] No  Do you ever skip doses due to side effects? [] Yes [x] No  Do you have trouble affording your medicines? [] Yes [x] No  Are you ever unable to pick up your medication due to transportation difficulties? [] Yes [x] No  Do you ever stop taking your medications because you don't believe they are helping? [] Yes [x] No  Do you check your weight daily? [x] Yes [] No   Adherence strategy: pill box   Barriers to obtaining medications: none   BP goal: <130/80  Social History:  Alcohol: none  Smoking: never   Diet: low salt diet   Exercise: walks around the house using walker   Home BP readings:  Wt Readings from Last 3 Encounters:  12/29/23 193 lb 12.8 oz (87.9 kg)  12/04/23 196 lb (88.9 kg)  11/11/23 198  lb (89.8 kg)   BP Readings from Last 3 Encounters:  12/29/23 (!) 122/58  12/04/23 130/60  11/11/23 132/72   Pulse Readings from Last 3 Encounters:  12/29/23 (!) 50  12/04/23 70  11/11/23 65    Renal function: CrCl cannot be calculated (Patient's most recent lab result is older than the maximum 21 days allowed.).  Past Medical History:  Diagnosis Date   Arthritis    CHF (congestive heart failure) (HCC)    Chronic HFrEF (heart failure with reduced ejection fraction) (HCC)    CKD (chronic kidney disease), stage III  (HCC)    Colon polyps    Goiter    Hyperglycemia    Hyperlipidemia    Hypertension    Hypothyroidism    Morbid obesity (HCC)    Osteopenia    Permanent atrial fibrillation (HCC)     Current Outpatient Medications on File Prior to Visit  Medication Sig Dispense Refill   acetaminophen (TYLENOL) 325 MG tablet Take 325-650 mg by mouth daily as needed for mild pain or headache.     Calcium Carb-Cholecalciferol (CALTRATE 600+D3 PO) Take 1 tablet by mouth every evening.     [employer]S IRON 325 (65 Fe) MG tablet Take 325 mg by mouth daily at 6 PM.     ELIQUIS 5 MG TABS tablet TAKE 1 TABLET BY MOUTH TWICE A DAY (Patient taking differently: Take 5 mg by mouth 2 (two) times daily.) 60 tablet 5   empagliflozin (JARDIANCE) 10 MG TABS tablet Take 1 tablet (10 mg total) by mouth daily. 30 tablet 11   furosemide (LASIX) 40 MG tablet Take 1 tablet (40 mg total) by mouth daily. Sliding scale Lasix: Weigh yourself when you get home, then Daily in the Morning. Your dry weight will be what your scale says on the day you return home.  If you gain more than 3 pounds from dry weight: Increase the Lasix dosing to 40 mg in the morning and 40 mg in the afternoon until weight returns to baseline dry weight.  If weight gain is greater than 5 pounds in 2 days: Increased to Lasix 80 mg in AM & 40 mg PM x 1 day & reassess weight on Day 2.  If no improvement, contact the office for further assistance if weight does not go down the next day.  If the weight goes down more than 3 pounds from dry weight: Hold Lasix until it returns to baseline dry weight 60 tablet 0   isosorbide mononitrate (IMDUR) 30 MG 24 hr tablet Take 1 tablet (30 mg total) by mouth daily. 90 tablet 3   lactulose (CHRONULAC) 10 GM/15ML solution Take 15 mLs (10 g total) by mouth every other day. (Patient not taking: Reported on 12/04/2023) 236 mL 0   levothyroxine (SYNTHROID, LEVOTHROID) 125 MCG tablet Take 125 mcg by mouth daily before breakfast.      metoprolol succinate (TOPROL-XL) 100 MG 24 hr tablet Take 1 tablet (100 mg total) by mouth daily. Take with or immediately following a meal. 90 tablet 3   omeprazole (PRILOSEC) 20 MG capsule TAKE 1 CAPSULE BY MOUTH EVERY DAY (Patient taking differently: Take 20 mg by mouth daily before breakfast.) 90 capsule 1   Oxymetazoline HCl (SINEX ULTRA FINE MIST 12-HOUR NA) Place 1 spray into both nostrils every 12 (twelve) hours as needed (for congestion). (Patient not taking: Reported on 12/04/2023)     pravastatin (PRAVACHOL) 80 MG tablet Take 80 mg by mouth in the morning.  VISINE 0.025-0.3 % ophthalmic solution Place 1 drop into both eyes 2 (two) times daily as needed for eye irritation.     vitamin B-12 (CYANOCOBALAMIN) 1000 MCG tablet Take 1,000 mcg by mouth daily.     No current facility-administered medications on file prior to visit.    Allergies  Allergen Reactions   Spironolactone Other (See Comments)    Hyponatremia-  the level of sodium in blood is too high   Atorvastatin Other (See Comments)    "Aches"      Assessment/Plan:   Acute on chronic diastolic heart failure (HCC) -     Basic metabolic panel; Future -     Digoxin level  HFrEF (heart failure with reduced ejection fraction) (HCC) Overview: Current CHF meds: Metoprolol succinate 100 mg daily, Jardiance 10mg  daily, digoxin 0.0625 mg every other day, furosemide 40mg  daily Previously tried: hydralazine - nausea, spironolactone (hyponatremia)  Assessment & Plan: Assessment: Blood pressure well controlled in clinic today Patient appears euvolemic Feeling good, no SOB, can do all ADL Compliant with medications HR sometimes drops into the 50's- would avoid increasing metoprolol With kidney function, would avoid adding ARB Spironolactone caused hyponatremia  Plan: Continue  Metoprolol succinate 100 mg daily, Jardiance 10mg  daily, digoxin 0.0625 mg every other day, furosemide 40mg  daily Recheck digoxin level and BMP next  week Follow up with Dr. Demetrius Charity in 1 month (02/09/24)   Other orders -     Digoxin; Take 0.5 tablets (0.0625 mg total) by mouth every other day.  Dispense: 23 tablet; Refill: 3     Thank you   Jessey Huyett D Braxon Suder, Pharm.D, BCACP, CPP [facility] of [facility] [address], [place], [place] [address]  Phone: [phone]; Fax: [phone]

## 2023-12-29 NOTE — Assessment & Plan Note (Addendum)
Assessment: Blood pressure well controlled in clinic today Patient appears euvolemic Feeling good, no SOB, can do all ADL Compliant with medications HR sometimes drops into the 50's- would avoid increasing metoprolol With kidney function, would avoid adding ARB Spironolactone caused hyponatremia  Plan: Continue  Metoprolol succinate 100 mg daily, Jardiance 10mg  daily, digoxin 0.0625 mg every other day, furosemide 40mg  daily Recheck digoxin level and BMP next week Follow up with Dr. Demetrius Charity in 1 month (02/09/24)

## 2023-12-30 DIAGNOSIS — H1132 Conjunctival hemorrhage, left eye: Secondary | ICD-10-CM | POA: Diagnosis not present

## 2023-12-31 ENCOUNTER — Encounter: Payer: Self-pay | Admitting: Pharmacist

## 2023-12-31 DIAGNOSIS — G9341 Metabolic encephalopathy: Secondary | ICD-10-CM

## 2024-01-05 DIAGNOSIS — I5033 Acute on chronic diastolic (congestive) heart failure: Secondary | ICD-10-CM | POA: Diagnosis not present

## 2024-01-05 DIAGNOSIS — G9341 Metabolic encephalopathy: Secondary | ICD-10-CM | POA: Diagnosis not present

## 2024-01-06 ENCOUNTER — Other Ambulatory Visit: Payer: Self-pay | Admitting: Pharmacist

## 2024-01-06 DIAGNOSIS — I5033 Acute on chronic diastolic (congestive) heart failure: Secondary | ICD-10-CM

## 2024-01-06 LAB — AMMONIA: Ammonia: 75 ug/dL (ref 31–169)

## 2024-01-06 LAB — DIGOXIN LEVEL: Digoxin, Serum: 0.4 ng/mL — ABNORMAL LOW (ref 0.5–0.9)

## 2024-01-19 DIAGNOSIS — Z9189 Other specified personal risk factors, not elsewhere classified: Secondary | ICD-10-CM | POA: Diagnosis not present

## 2024-01-19 DIAGNOSIS — R809 Proteinuria, unspecified: Secondary | ICD-10-CM | POA: Diagnosis not present

## 2024-01-19 DIAGNOSIS — N1831 Chronic kidney disease, stage 3a: Secondary | ICD-10-CM | POA: Diagnosis not present

## 2024-01-19 DIAGNOSIS — E039 Hypothyroidism, unspecified: Secondary | ICD-10-CM | POA: Diagnosis not present

## 2024-01-19 DIAGNOSIS — I272 Pulmonary hypertension, unspecified: Secondary | ICD-10-CM | POA: Diagnosis not present

## 2024-01-19 DIAGNOSIS — I700 Atherosclerosis of aorta: Secondary | ICD-10-CM | POA: Diagnosis not present

## 2024-01-19 DIAGNOSIS — Z7901 Long term (current) use of anticoagulants: Secondary | ICD-10-CM | POA: Diagnosis not present

## 2024-01-19 DIAGNOSIS — I480 Paroxysmal atrial fibrillation: Secondary | ICD-10-CM | POA: Diagnosis not present

## 2024-01-19 DIAGNOSIS — I5022 Chronic systolic (congestive) heart failure: Secondary | ICD-10-CM | POA: Diagnosis not present

## 2024-01-19 DIAGNOSIS — G4734 Idiopathic sleep related nonobstructive alveolar hypoventilation: Secondary | ICD-10-CM | POA: Diagnosis not present

## 2024-01-19 DIAGNOSIS — I130 Hypertensive heart and chronic kidney disease with heart failure and stage 1 through stage 4 chronic kidney disease, or unspecified chronic kidney disease: Secondary | ICD-10-CM | POA: Diagnosis not present

## 2024-01-19 DIAGNOSIS — E871 Hypo-osmolality and hyponatremia: Secondary | ICD-10-CM | POA: Diagnosis not present

## 2024-01-21 LAB — BASIC METABOLIC PANEL: EGFR: 33.6

## 2024-02-01 ENCOUNTER — Other Ambulatory Visit: Payer: Self-pay | Admitting: Cardiology

## 2024-02-02 NOTE — Telephone Encounter (Signed)
 Prescription refill request for Eliquis received. Indication:afib Last office visit:1/25 Scr:1.5  2/25 Age: 78 Weight:87.9  kg  Prescription refilled

## 2024-02-09 ENCOUNTER — Encounter: Payer: Self-pay | Admitting: Cardiology

## 2024-02-09 ENCOUNTER — Ambulatory Visit: Payer: Self-pay | Attending: Cardiology | Admitting: Cardiology

## 2024-02-09 VITALS — BP 126/62 | HR 98 | Ht 66.0 in | Wt 192.8 lb

## 2024-02-09 DIAGNOSIS — I4819 Other persistent atrial fibrillation: Secondary | ICD-10-CM | POA: Diagnosis not present

## 2024-02-09 DIAGNOSIS — I5020 Unspecified systolic (congestive) heart failure: Secondary | ICD-10-CM

## 2024-02-09 NOTE — Patient Instructions (Signed)
  Follow-Up: At [facility], you and your health needs are our priority.  As part of our continuing mission to provide you with exceptional heart care, we have created designated Provider Care Teams.  These Care Teams include your primary Cardiologist (physician) and Advanced Practice Providers (APPs -  Physician Assistants and Nurse Practitioners) who all work together to provide you with the care you need, when you need it.     Your next appointment:   3 month(s)  Provider:   Elder Negus, MD     Other Instructions   1st Floor: - Lobby - Registration  - Pharmacy  - Lab - Cafe  2nd Floor: - PV Lab - Diagnostic Testing (echo, CT, nuclear med)  3rd Floor: - Vacant  4th Floor: - TCTS (cardiothoracic surgery) - AFib Clinic - Structural Heart Clinic - Vascular Surgery  - Vascular Ultrasound  5th Floor: - HeartCare Cardiology (general and EP) - Clinical Pharmacy for coumadin, hypertension, lipid, weight-loss medications, and med management appointments    Valet parking services will be available as well.

## 2024-02-09 NOTE — Progress Notes (Signed)
 Cardiology Office Note:  .   Date:  02/09/2024  ID:  [name], DOB [birth year], MRN [id number] PCP: Creola Corn, MD  [facility] Providers Cardiologist:  Truett Mainland, MD PCP: Creola Corn, MD  Chief Complaint  Patient presents with   HFpEF      History of Present Illness: .    [name] is a 78 y.o. female with hypertension, persistent atrial fibrillation, moderate obesity, pericardial effusion s/p pericardiocentesis (01/2021), new diagnosis HFrEF, severe WHO Grp II PH (08/2023)  Patient is here with her daughter today.  Overall, she has been doing fairly well.  Leg edema is controlled.  She has not had any significant exertional dyspnea symptoms.  Her symptoms of hallucination and delusions are improved, if not resolved.  Taking lactulose at least once a week seems to keep the symptoms control, perhaps as a result of maintaining ammonia levels.  Vitals:   02/09/24 1411  BP: 126/62  Pulse: 98  SpO2: 97%      ROS:  Review of Systems  Cardiovascular:  Positive for leg swelling. Negative for chest pain, dyspnea on exertion, palpitations and syncope.     Studies Reviewed: Marland Kitchen        EKG 02/09/2024: Atrial fibrillation 98 bpm  Left bundle branch block When compared with ECG of 09-Oct-2023 05:38, No significant change was found  Independently interpreted 12/2023: Hb 14.3 Cr 1.5, eGFR 35  09/09/2023: Chol 151, TG 97, HDL 46, LDL 86 Glucose 100, BUN/Cr 18/1.22. EGFR 45. Na/K 125/4.8.  H/H 14/44. MCV 84. Platelets 315  EKG 09/10/2023: Atrial fibrillation Left bundle branch block When compared with ECG of 10-Sep-2023 14:48, No significant change was found  Labs Sep-Oct 2024: Na remains 124-129, most recent 124 on 09/02/2023    Right and left heart cath 09/01/2023: Angiographically mild to moderate diffuse disease in the LCx and LAD but not flow-limiting. Right dominant system. Moderate to Severe Secondary Pulmonary Hypertension With Pulmonary Venous  Hypertension:  PAP-mean 69/34-47 mmHg with PCWP of 33- 35 mmHg and LVp-EDP 162/34-26 mmHg.  RAP mean 15 mmHg, RV P-EDP 66/9-15 mmHg Ao sat 94%, PA sat 61%.   Mildly reduced Cardiac Output 4.29, cardiac index 2.09     RECOMMENDATIONS: Patient likely requires additional IV diuresis.  Will [facility] for ongoing diuresis . Continue to titrate GDMT for CHF Consider rhythm control for A-fib with bundle branch block.     Echocardiogram 09/10/2023: Left ventricular ejection fraction, by estimation, is 30 to 35%. The  left ventricle has moderately decreased function. The left ventricle  demonstrates global hypokinesis. There is mild left ventricular  hypertrophy. Left ventricular diastolic parameters are indeterminate.   2. Right ventricular systolic function is moderately reduced. The right  ventricular size is normal.   3. Left atrial size was severely dilated.   4. Right atrial size was severely dilated.   5. Moderate pleural effusion.   6. Trivial mitral valve regurgitation.   7. Tricuspid valve regurgitation is mild to moderate.   8. There is moderate calcification of the aortic valve. Aortic valve  regurgitation is mild. Mild aortic valve stenosis.   9. The inferior vena cava is dilated in size with >50% respiratory  variability, suggesting right atrial pressure of 8 mmHg.   Conclusion(s)/Recommendation(s): EF has declined compared to prior study.    Risk Assessment/Calculations:   CHA2DS2VASc score 5 Annual stroke risk 7.2%   Physical Exam:   Physical Exam Vitals and nursing note reviewed.  Constitutional:  General: She is not in acute distress. Neck:     Vascular: No JVD.  Cardiovascular:     Rate and Rhythm: Normal rate. Rhythm irregular.     Heart sounds: Normal heart sounds. No murmur heard. Pulmonary:     Effort: Pulmonary effort is normal.     Breath sounds: Normal breath sounds. No wheezing or rales.  Musculoskeletal:     Right lower  leg: Edema (Trace) present.     Left lower leg: Edema (Trace) present.      VISIT DIAGNOSES:   ICD-10-CM   1. HFrEF (heart failure with reduced ejection fraction) (HCC)  I50.20 EKG 12-Lead    2. Persistent atrial fibrillation (HCC)  I48.19         ASSESSMENT AND PLAN: .    [name] is a 78 y.o. female with hypertension, persistent atrial fibrillation, moderate obesity, pericardial effusion s/p pericardiocentesis (01/2021), new diagnosis HFrEF, severe WHO Grp II PH (08/2023)  HFrEF: EF 30-35% (09/2023). Chronic, euvolemic today. Continue Jardiance 10 mg daily, metoprolol succinate 100 mg daily, lasix 40 mg daily. Not a candidate for MRA/ARNI/ARB due to renal dysfunction.  Persistent Afib: Rate controlled on metoprolol tartrate 50 mg bid, diltiazem 120 mg daily. CHA2DS2VASc score 5: Annual stroke risk 7.2% Continue eliquis 5 mg bid.    F/u in 3 months  Signed, Elder Negus, MD

## 2024-03-24 ENCOUNTER — Encounter: Payer: Self-pay | Admitting: Cardiology

## 2024-03-25 MED ORDER — FUROSEMIDE 40 MG PO TABS: 40.0000 mg | ORAL_TABLET | Freq: Every day | ORAL | 11 refills | Status: DC

## 2024-03-28 ENCOUNTER — Other Ambulatory Visit: Payer: Self-pay | Admitting: Cardiology

## 2024-03-29 NOTE — Telephone Encounter (Signed)
 Prescription refill request for Eliquis  received. Indication:afib Last office visit:3/25 Scr:1.5  2/25 Age: 78 Weight:87.5  kg  Prescription refilled

## 2024-04-07 DIAGNOSIS — H2513 Age-related nuclear cataract, bilateral: Secondary | ICD-10-CM | POA: Diagnosis not present

## 2024-05-07 ENCOUNTER — Encounter: Payer: Self-pay | Admitting: Cardiology

## 2024-05-07 NOTE — Telephone Encounter (Signed)
 Thank you for reaching out Ms. Cornelius Dill.  I am aware of her issues dating back several months.  Her cataract can affect her vision, it should not cause hallucinations.  I do not think any medications are causing this either, although Dr. Mamie Searles will be able to better assess this.  I am beginning to wonder if she has developed to visual and auditory hallucinations that may need further help.  Have you talk to Dr. Mamie Searles about this recently?  Regards, Dr. Filiberto Hug

## 2024-05-11 ENCOUNTER — Other Ambulatory Visit: Payer: Self-pay

## 2024-05-11 ENCOUNTER — Encounter: Payer: Self-pay | Admitting: Cardiology

## 2024-05-11 ENCOUNTER — Ambulatory Visit: Attending: Cardiology | Admitting: Cardiology

## 2024-05-11 VITALS — BP 130/82 | HR 87 | Ht 66.0 in | Wt 188.0 lb

## 2024-05-11 DIAGNOSIS — I5020 Unspecified systolic (congestive) heart failure: Secondary | ICD-10-CM | POA: Diagnosis not present

## 2024-05-11 DIAGNOSIS — E782 Mixed hyperlipidemia: Secondary | ICD-10-CM | POA: Diagnosis not present

## 2024-05-11 DIAGNOSIS — Z5181 Encounter for therapeutic drug level monitoring: Secondary | ICD-10-CM | POA: Insufficient documentation

## 2024-05-11 DIAGNOSIS — I4819 Other persistent atrial fibrillation: Secondary | ICD-10-CM | POA: Diagnosis not present

## 2024-05-11 DIAGNOSIS — I2510 Atherosclerotic heart disease of native coronary artery without angina pectoris: Secondary | ICD-10-CM | POA: Insufficient documentation

## 2024-05-11 MED ORDER — ROSUVASTATIN CALCIUM 20 MG PO TABS: 20.0000 mg | ORAL_TABLET | Freq: Every day | ORAL | 3 refills | Status: DC

## 2024-05-11 NOTE — Patient Instructions (Signed)
 Medication Instructions:  STOP DIGOXIN  STOP PRAVASTATIN  START ROSUVASTATIN 20 MG DAILY  *If you need a refill on your cardiac medications before your next appointment, please call your pharmacy*  Lab Work: DIG LEVELS TODAY LIPIDS IN 3 MONTHS If you have labs (blood work) drawn today and your tests are completely normal, you will receive your results only by: MyChart Message (if you have MyChart) OR A paper copy in the mail If you have any lab test that is abnormal or we need to change your treatment, we will call you to review the results.  Testing/Procedures: ECHO  Your physician has requested that you have an echocardiogram. Echocardiography is a painless test that uses sound waves to create images of your heart. It provides your doctor with information about the size and shape of your heart and how well your heart's chambers and valves are working. This procedure takes approximately one hour. There are no restrictions for this procedure. Please do NOT wear cologne, perfume, aftershave, or lotions (deodorant is allowed). Please arrive 15 minutes prior to your appointment time.  Please note: We ask at that you not bring children with you during ultrasound (echo/ vascular) testing. Due to room size and safety concerns, children are not allowed in the ultrasound rooms during exams. Our front office staff cannot provide observation of children in our lobby area while testing is being conducted. An adult accompanying a patient to their appointment will only be allowed in the ultrasound room at the discretion of the ultrasound technician under special circumstances. We apologize for any inconvenience.   Follow-Up: At [facility], you and your health needs are our priority.  As part of our continuing mission to provide you with exceptional heart care, our providers are all part of one team.  This team includes your primary Cardiologist (physician) and Advanced Practice Providers or APPs  (Physician Assistants and Nurse Practitioners) who all work together to provide you with the care you need, when you need it.  Your next appointment:   6 month(s)  Provider:   Cody Das, MD

## 2024-05-11 NOTE — Progress Notes (Signed)
 Cardiology Office Note:  .   Date:  05/11/2024  ID:  [name], DOB 05-14-1946, MRN [id number] PCP: Margarete Sharps, MD  [facility] Providers Cardiologist:  Fransico Ivy, MD PCP: Margarete Sharps, MD  Chief Complaint  Patient presents with   HFrEF      History of Present Illness: .    [name] is a 78 y.o. female with hypertension, persistent atrial fibrillation, moderate obesity, pericardial effusion s/p pericardiocentesis (01/2021), new diagnosis HFrEF, severe WHO Grp II PH (08/2023)  Patient is here with her daughter today.  Overall, she has been doing fairly well.  She has received bigger shortness of breath symptoms, leg swelling has resolved.  Patient continues to hear noises and people talking female who are not physically there.    There were no vitals filed for this visit.     ROS:  Review of Systems  Cardiovascular:  Negative for chest pain, dyspnea on exertion, leg swelling, palpitations and syncope.     Studies Reviewed: Aaron Aas        EKG 02/09/2024: Atrial fibrillation 98 bpm  Left bundle branch block When compared with ECG of 09-Oct-2023 05:38, No significant change was found  Independently interpreted 12/2023: Hb 14.3 Cr 1.5, eGFR 35  09/09/2023: Chol 151, TG 97, HDL 46, LDL 86 Glucose 100, BUN/Cr 18/1.22. EGFR 45. Na/K 125/4.8.  H/H 14/44. MCV 84. Platelets 315  EKG 09/10/2023: Atrial fibrillation Left bundle branch block When compared with ECG of 10-Sep-2023 14:48, No significant change was found  Labs Sep-Oct 2024: Na remains 124-129, most recent 124 on 09/02/2023    Right and left heart cath 09/01/2023: Angiographically mild to moderate diffuse disease in the LCx and LAD but not flow-limiting. Right dominant system. Moderate to Severe Secondary Pulmonary Hypertension With Pulmonary Venous Hypertension:  PAP-mean 69/34-47 mmHg with PCWP of 33- 35 mmHg and LVp-EDP 162/34-26 mmHg.  RAP mean 15 mmHg, RV P-EDP 66/9-15 mmHg Ao sat 94%, PA  sat 61%.   Mildly reduced Cardiac Output 4.29, cardiac index 2.09     RECOMMENDATIONS: Patient likely requires additional IV diuresis.  Will return to [facility] hospital for ongoing diuresis . Continue to titrate GDMT for CHF Consider rhythm control for A-fib with bundle branch block.     Echocardiogram 09/10/2023: Left ventricular ejection fraction, by estimation, is 30 to 35%. The  left ventricle has moderately decreased function. The left ventricle  demonstrates global hypokinesis. There is mild left ventricular  hypertrophy. Left ventricular diastolic parameters are indeterminate.   2. Right ventricular systolic function is moderately reduced. The right  ventricular size is normal.   3. Left atrial size was severely dilated.   4. Right atrial size was severely dilated.   5. Moderate pleural effusion.   6. Trivial mitral valve regurgitation.   7. Tricuspid valve regurgitation is mild to moderate.   8. There is moderate calcification of the aortic valve. Aortic valve  regurgitation is mild. Mild aortic valve stenosis.   9. The inferior vena cava is dilated in size with >50% respiratory  variability, suggesting right atrial pressure of 8 mmHg.   Conclusion(s)/Recommendation(s): EF has declined compared to prior study.    Risk Assessment/Calculations:    CHA2DS2-VASc Score = 5  This indicates a 7.2% annual risk of stroke. The patient's score is based upon: CHF History: 1 HTN History: 1 Diabetes History: 0 Stroke History: 0 Vascular Disease History: 0 Age Score: 2 Gender Score: 1    Physical Exam:   Physical  Exam Vitals and nursing note reviewed.  Constitutional:      General: She is not in acute distress. Neck:     Vascular: No JVD.  Cardiovascular:     Rate and Rhythm: Normal rate. Rhythm irregular.     Heart sounds: Normal heart sounds. No murmur heard. Pulmonary:     Effort: Pulmonary effort is normal.     Breath sounds: Normal breath sounds. No wheezing or  rales.  Musculoskeletal:     Right lower leg: No edema.     Left lower leg: No edema.      VISIT DIAGNOSES:   ICD-10-CM   1. HFrEF (heart failure with reduced ejection fraction) (HCC)  I50.20 ECHOCARDIOGRAM COMPLETE    2. Coronary artery disease involving native coronary artery of native heart without angina pectoris  I25.10 Lipid panel    3. Mixed hyperlipidemia  E78.2 Lipid panel    4. Therapeutic drug monitoring  Z51.81 Digoxin  level    5. Persistent atrial fibrillation (HCC)  I48.19          ASSESSMENT AND PLAN: .    [name] is a 78 y.o. female with hypertension, persistent atrial fibrillation, moderate obesity, pericardial effusion s/p pericardiocentesis (01/2021), new diagnosis HFrEF, severe WHO Grp II PH (08/2023)  HFrEF: EF 30-35% (09/2023). Chronic, euvolemic today. Continue Jardiance  10 mg daily, metoprolol  succinate 100 mg daily, lasix  40 mg daily. Not a candidate for MRA/ARNI/ARB due to renal dysfunction. Check echocardiogram.  Persistent Afib: Rate controlled on metoprolol  tartrate 50 mg bid, diltiazem  120 mg daily. CHA2DS2VASc score 5: Annual stroke risk 7.2% Continue eliquis  5 mg bid. I do not think she particularly needs digoxin  for A-fib management.  Additionally, this toxicity would be an outlier of differential diagnosis for her hallucinations.  Will check this level and stop digoxin  today.  Mixed hyperlipidemia: LDL 86, mild to moderate diffuse CAD. Change pravastatin  80 mg daily to Crestor 20 mg daily.  Repeat lipid panel in 3 months.  Hallucinations: Auditory and visual hallucinations persist.  This may need further evaluation.  Fortunately, she has upcoming follow-up with her PCP Dr. Mamie Searles on 05/13/2024, but this can be further discussed.   F/u in 6 months  Signed, Cody Das, MD

## 2024-05-12 ENCOUNTER — Ambulatory Visit: Payer: Self-pay | Admitting: Cardiology

## 2024-05-12 DIAGNOSIS — I350 Nonrheumatic aortic (valve) stenosis: Secondary | ICD-10-CM

## 2024-05-12 LAB — DIGOXIN LEVEL: Digoxin, Serum: 0.5 ng/mL (ref 0.5–0.9)

## 2024-05-13 DIAGNOSIS — I2721 Secondary pulmonary arterial hypertension: Secondary | ICD-10-CM | POA: Diagnosis not present

## 2024-05-13 DIAGNOSIS — I480 Paroxysmal atrial fibrillation: Secondary | ICD-10-CM | POA: Diagnosis not present

## 2024-05-13 DIAGNOSIS — I130 Hypertensive heart and chronic kidney disease with heart failure and stage 1 through stage 4 chronic kidney disease, or unspecified chronic kidney disease: Secondary | ICD-10-CM | POA: Diagnosis not present

## 2024-05-13 DIAGNOSIS — R600 Localized edema: Secondary | ICD-10-CM | POA: Diagnosis not present

## 2024-05-13 DIAGNOSIS — Z72821 Inadequate sleep hygiene: Secondary | ICD-10-CM | POA: Diagnosis not present

## 2024-05-13 DIAGNOSIS — I5022 Chronic systolic (congestive) heart failure: Secondary | ICD-10-CM | POA: Diagnosis not present

## 2024-05-13 DIAGNOSIS — E871 Hypo-osmolality and hyponatremia: Secondary | ICD-10-CM | POA: Diagnosis not present

## 2024-05-13 DIAGNOSIS — I272 Pulmonary hypertension, unspecified: Secondary | ICD-10-CM | POA: Diagnosis not present

## 2024-05-13 DIAGNOSIS — G4734 Idiopathic sleep related nonobstructive alveolar hypoventilation: Secondary | ICD-10-CM | POA: Diagnosis not present

## 2024-05-13 DIAGNOSIS — Z9189 Other specified personal risk factors, not elsewhere classified: Secondary | ICD-10-CM | POA: Diagnosis not present

## 2024-05-13 DIAGNOSIS — N1831 Chronic kidney disease, stage 3a: Secondary | ICD-10-CM | POA: Diagnosis not present

## 2024-05-13 DIAGNOSIS — R443 Hallucinations, unspecified: Secondary | ICD-10-CM | POA: Diagnosis not present

## 2024-05-13 NOTE — Telephone Encounter (Signed)
 Since we are stopping the medication, no need to recheck this again.  Thanks MJP

## 2024-05-26 ENCOUNTER — Encounter: Payer: Self-pay | Admitting: *Deleted

## 2024-05-27 ENCOUNTER — Encounter: Payer: Self-pay | Admitting: Neurology

## 2024-05-27 ENCOUNTER — Ambulatory Visit: Admitting: Neurology

## 2024-05-27 VITALS — BP 138/81 | HR 74 | Ht 66.0 in | Wt 190.0 lb

## 2024-05-27 DIAGNOSIS — G9341 Metabolic encephalopathy: Secondary | ICD-10-CM

## 2024-05-27 DIAGNOSIS — R440 Auditory hallucinations: Secondary | ICD-10-CM

## 2024-05-27 DIAGNOSIS — Z9289 Personal history of other medical treatment: Secondary | ICD-10-CM

## 2024-05-27 NOTE — Patient Instructions (Signed)
 We will do an EEG (brainwave test), which we will schedule. We will call you with the results. So long as your EEG is within normal range and does not indicate risk for seizures, we can see you in this clinic as needed.   Please talk to Dr. Onita about seeing a lung doctor for evaluation of your night time oxygen drops.   Please talk to Dr. Onita about getting evaluated for hearing loss.

## 2024-05-27 NOTE — Progress Notes (Signed)
 Subjective:    Patient ID: [name] is a 78 y.o. female.  HPI    True Mar, MD, PhD [facility] [address] [address] [place], [place] [address]  Dear Dr. Onita,   I saw your patient, [name], upon your kind request in my neurologic clinic today for initial consultation of her cognitive concerns, particularly, her ongoing auditory hallucinations.  The patient is accompanied by her daughter today.  As you know, [name] is a 78 year old female with an underlying complex medical history of congestive heart failure, coronary artery disease, hyperlipidemia, A-fib, lower extremity edema, history of recent hospitalization with metabolic encephalopathy, including secondary to low sodium and hyperammonemia, who reports ongoing issues with hearing echoing like noises in her ears.  She does have suspected hearing loss but has not been formally evaluated for hearing aids.  She presented to the hospital in November with altered mental status, hallucinations and delirium.  Hospitalization was for about a week, I reviewed hospital records from 10/08/2023 through 10/14/2023.  She was admitted for acute on chronic CHF, complicated by persistent A-fib, prolonged QT, encephalopathy and delirium, low sodium, hyperammonemia.  She was treated for this with lactulose .  She had visual and auditory hallucinations at the time and while the visual hallucinations improved and her confusion and delirium almost completely improved, she still has some intermittent auditory hallucinations.  She is not to the point where she acts on these, in fact, sometimes it just sounds like an echo and turning down the TV actually improves these.  She denies any significant forgetfulness or confusion and daughter feels that overall she has done much better.  She is restricted in her fluid intake by cardiology and supposedly is supposed to drink only 20 ounces of water and 1 bottle of Gatorade.   She does not drink any caffeine in the form of coffee but does drink 1 diet soda per day.  Reportedly she also had nocturnal hypoxemia but has not been evaluated for this per daughter.  She has not had a sleep study.  Apparently she was sent home with a CPAP machine or someone brought a CPAP machine to her home but she did not try it or open the box as she did not think it looked clean.  She never actually looked at the machine, just the box did not look clean to her.  She does not sleep very well.  She is no longer on trazodone  as it was also suspected to cause her confusion.  She uses a walker.  She has no history of sudden onset one-sided weakness or numbness or tingling or droopy face or slurring of speech.  She had a brain MRI without contrast on 10/08/2023 and I reviewed the results:   IMPRESSION: 1. No acute intracranial abnormality. 2. Chronic small vessel ischemia and volume loss.   She was recently tried on Depakote but had side effects.  Her Past Medical History Is Significant For: Past Medical History:  Diagnosis Date   Arthritis    CHF (congestive heart failure) (HCC)    Chronic HFrEF (heart failure with reduced ejection fraction) (HCC)    CKD (chronic kidney disease), stage III (HCC)    Colon polyps    Goiter    Hyperglycemia    Hyperlipidemia    Hypertension    Hypothyroidism    Morbid obesity (HCC)    Osteopenia    Permanent atrial fibrillation (HCC)     Her Past Surgical  History Is Significant For: Past Surgical History:  Procedure Laterality Date   CARDIOVASCULAR STRESS TEST     COLONOSCOPY     IR THORACENTESIS ASP PLEURAL SPACE W/IMG GUIDE  01/26/2021   LUMBAR DISC SURGERY  2012   PERICARDIOCENTESIS N/A 01/24/2021   Procedure: PERICARDIOCENTESIS;  Surgeon: Ladona Heinz, MD;  Location: [facility] INVASIVE CV LAB;  Service: Cardiovascular;  Laterality: N/A;   POLYPECTOMY     RIGHT/LEFT HEART CATH AND CORONARY ANGIOGRAPHY N/A 09/01/2023   Procedure: RIGHT/LEFT HEART CATH AND  CORONARY ANGIOGRAPHY;  Surgeon: Anner Alm ORN, MD;  Location: [facility] INVASIVE CV LAB;  Service: Cardiovascular;  Laterality: N/A;   TOTAL KNEE ARTHROPLASTY Left 05/29/2017   Procedure: LEFT TOTAL KNEE ARTHROPLASTY;  Surgeon: Jerri Kay HERO, MD;  Location: [facility] OR;  Service: Orthopedics;  Laterality: Left;   TUBAL LIGATION  1977   UPPER GASTROINTESTINAL ENDOSCOPY  12/29/2019   US  ECHOCARDIOGRAPHY      Her Family History Is Significant For: Family History  Problem Relation Age of Onset   Heart disease Mother    CVA Mother    Heart disease Father    Heart disease Brother    Colon cancer Neg Hx    Stomach cancer Neg Hx    Colon polyps Neg Hx    Esophageal cancer Neg Hx    Rectal cancer Neg Hx     Her Social History Is Significant For: Social History   Socioeconomic History   Marital status: Married    Spouse name: Not on file   Number of children: 2   Years of education: Not on file   Highest education level: Not on file  Occupational History   Occupation: retired  Tobacco Use   Smoking status: Never   Smokeless tobacco: Never  Vaping Use   Vaping status: Never Used  Substance and Sexual Activity   Alcohol  use: No   Drug use: Never   Sexual activity: Not on file  Other Topics Concern   Not on file  Social History Narrative   Caffiene diet drink   Lives with husbands, no pets   Retired.    Social Drivers of Corporate investment banker Strain: Not on file  Food Insecurity: No Food Insecurity (10/08/2023)   Hunger Vital Sign    Worried About Running Out of Food in the Last Year: Never true    Ran Out of Food in the Last Year: Never true  Transportation Needs: No Transportation Needs (10/08/2023)   PRAPARE - Administrator, Civil Service (Medical): No    Lack of Transportation (Non-Medical): No  Physical Activity: Not on file  Stress: Not on file  Social Connections: Not on file    Her Allergies Are:  Allergies  Allergen Reactions   Spironolactone  Other  (See Comments)    Hyponatremia-  the level of sodium in blood is too high   Atorvastatin Other (See Comments)    Aches   :   Her Current Medications Are:  Outpatient Encounter Medications as of 05/27/2024  Medication Sig   acetaminophen  (TYLENOL ) 325 MG tablet Take 325-650 mg by mouth daily as needed for mild pain or headache.   apixaban  (ELIQUIS ) 5 MG TABS tablet TAKE 1 TABLET BY MOUTH TWICE A DAY   Calcium  Carb-Cholecalciferol  (CALTRATE 600+D3 PO) Take 1 tablet by mouth every evening.   [employer] IRON 325 (65 Fe) MG tablet Take 325 mg by mouth daily at 6 PM.   empagliflozin  (JARDIANCE ) 10 MG TABS tablet  Take 1 tablet (10 mg total) by mouth daily.   furosemide  (LASIX ) 40 MG tablet Take 1 tablet (40 mg total) by mouth daily. Sliding scale Lasix : Weigh yourself when you get home, then Daily in the Morning. Your dry weight will be what your scale says on the day you return home. If you gain more than 3 pounds from dry weight: Increase the Lasix  dosing to 40 mg in the morning and 40 mg in the afternoon until weight returns to baseline dry weight. If weight gain is greater than 5 pounds in 2 days: Increased to Lasix  80 mg in AM & 40 mg PM x 1 day & reassess weight on Day 2. If no improvement, contact the office for further assistance if weight does not go down the next day. If the weight goes down more than 3 pounds from dry weight: Hold Lasix  until it returns to baseline dry weight   isosorbide  mononitrate (IMDUR ) 30 MG 24 hr tablet Take 1 tablet (30 mg total) by mouth daily.   lactulose  (CHRONULAC ) 10 GM/15ML solution Take 15 mLs (10 g total) by mouth every other day.   levothyroxine  (SYNTHROID , LEVOTHROID) 125 MCG tablet Take 125 mcg by mouth daily before breakfast.   metoprolol  succinate (TOPROL -XL) 100 MG 24 hr tablet Take 1 tablet (100 mg total) by mouth daily. Take with or immediately following a meal.   omeprazole  (PRILOSEC) 20 MG capsule TAKE 1 CAPSULE BY MOUTH EVERY DAY (Patient taking  differently: Take 20 mg by mouth daily before breakfast.)   Oxymetazoline HCl (SINEX ULTRA FINE MIST 12-HOUR NA) Place 1 spray into both nostrils every 12 (twelve) hours as needed (for congestion).   rosuvastatin  (CRESTOR ) 20 MG tablet Take 1 tablet (20 mg total) by mouth daily.   VISINE 0.025-0.3 % ophthalmic solution Place 1 drop into both eyes 2 (two) times daily as needed for eye irritation.   vitamin B-12 (CYANOCOBALAMIN ) 1000 MCG tablet Take 1,000 mcg by mouth daily.   No facility-administered encounter medications on file as of 05/27/2024.  :   Review of Systems:  Out of a complete 14 point review of systems, all are reviewed and negative with the exception of these symptoms as listed below:  Review of Systems  Neurological:        Been hospitial 09/2024 thru 11/2024 ammonia elevated, lactulose  helped.  No ammonia low  (level?)  still with auditory hallucinations.  No issues with memory.  Attempted sleep test, did not complete.  ADL great.      Objective:  Neurological Exam  Physical Exam Physical Examination:   Vitals:   05/27/24 1347  BP: 138/81  Pulse: 74    General Examination: The patient is a very pleasant 78 y.o. female in no acute distress. She appears deconditioned.  She is well-groomed.  She is seated on the seat of her 4 wheeled walker.   HEENT: Normocephalic, atraumatic, pupils are equal, round and reactive to light, extraocular tracking is good without limitation to gaze excursion or nystagmus noted.  Corrective eyeglasses in place.  Hearing is mildly impaired.  Face is symmetric with normal facial animation. Speech is clear with no dysarthria noted. There is no hypophonia. There is no lip, neck/head, jaw or voice tremor. Neck is supple with full range of passive and active motion. There are no carotid bruits on auscultation. Oropharynx exam reveals: moderate mouth dryness, adequate dental hygiene and moderate airway crowding. Tongue protrudes centrally and palate  elevates symmetrically.   Chest: Clear to auscultation  without wheezing, rhonchi or crackles noted.  Heart: S1+S2+0, regular and normal without murmurs, rubs or gallops noted.   Abdomen: Soft, non-tender and non-distended.  Extremities: There is trace pitting edema in the distal lower extremities bilaterally.   Skin: Warm but appears quite dry.  He has patchy hypopigmentation in her face, particularly around the hairline.   Musculoskeletal: exam reveals no obvious joint deformities.   Neurologically:  Mental status: The patient is awake, alert and oriented in all 4 spheres. Her immediate and remote memory, attention, language skills and fund of knowledge are quite appropriate, she is able to describe her symptoms quite well, history is supplemented by her daughter.  Thought process is linear. Mood is normal and affect is normal.  Cranial nerves II - XII are as described above under HEENT exam.  Motor exam: Normal bulk, global strength of about 4+ out of 5 strength and tone is noted. There is no obvious action or resting tremor.  Fine motor skills and coordination: Mildly impaired globally, no lateralization noted.    Cerebellar testing: No dysmetria or intention tremor. There is no truncal or gait ataxia.  Sensory exam: intact to light touch in the upper and lower extremities.  Gait, station and balance: She stands slowly and walks with a rolling walker, no shuffling.  Assessment and Plan:  In summary, [name] is a very pleasant 78 y.o.-year old female with an underlying complex medical history of congestive heart failure, coronary artery disease, hyperlipidemia, A-fib, lower extremity edema, history of recent hospitalization with metabolic encephalopathy, including secondary to low sodium and hyperammonemia, who presents for evaluation of her auditory hallucinations.  She denies any significant cognitive issues, daughter feels that in that regard she actually has improved quite a bit  since her hospitalization last year.  She had practically complete resolution of visual hallucinations.  Even though her auditory also improved, she has some residual echo like noises she hears, does not actually make out any words or sentences.  Neurological exam is largely nonfocal.  She had significant and extensive workup while in the hospital.  She is advised to proceed with an EEG through our office.  We will call with results.  I do not see a pressing reason to repeat her brain MRI at this time.  She is advised to try to stay well-hydrated within the recommended fluid restriction per cardiology.  She is advised to follow-up with your office regarding the oxygen drops while asleep, she may need to be evaluated through pulmonology for this.  She is advised to also talk to you about getting her hearing formally evaluated.  At this juncture, so long as the EEG is nonrevealing and not in keeping with lowered seizure threshold, and does not show any frank seizure-like changes, we can follow-up in this office as needed.  I answered all the questions today and the patient and her daughter were in agreement.  This was an extended visit of over 60 minutes with copious record review involved and considerable counseling and coordination of care.   Thank you very much for allowing me to participate in the care of this nice patient. If I can be of any further assistance to you please do not hesitate to call me at [phone].  Sincerely,   True Mar, MD, PhD

## 2024-06-03 ENCOUNTER — Ambulatory Visit: Payer: Self-pay | Admitting: Neurology

## 2024-06-03 ENCOUNTER — Ambulatory Visit: Admitting: Neurology

## 2024-06-03 DIAGNOSIS — G9341 Metabolic encephalopathy: Secondary | ICD-10-CM

## 2024-06-03 DIAGNOSIS — R4182 Altered mental status, unspecified: Secondary | ICD-10-CM

## 2024-06-03 DIAGNOSIS — R440 Auditory hallucinations: Secondary | ICD-10-CM

## 2024-06-03 DIAGNOSIS — Z9289 Personal history of other medical treatment: Secondary | ICD-10-CM

## 2024-06-03 NOTE — Progress Notes (Signed)
 S

## 2024-06-03 NOTE — Procedures (Signed)
    History:  78 year old woman with altered mental status   EEG classification: Awake and drowsy  Duration: 27 minutes   Technical aspects: This EEG study was done with scalp electrodes positioned according to the 10-20 International system of electrode placement. Electrical activity was reviewed with band pass filter of 1-70Hz , sensitivity of 7 uV/mm, display speed of 75mm/sec with a 60Hz  notched filter applied as appropriate. EEG data were recorded continuously and digitally stored.   Description of the recording: The background rhythms of this recording consists of a fairly well modulated medium amplitude alpha rhythm of 9 Hz that is reactive to eye opening and closure. Present in the anterior head region is a 15-20 Hz beta activity. Photic stimulation was performed, did not show any abnormalities. Hyperventilation was not performed. Drowsiness was manifested by background fragmentation. No abnormal epileptiform discharges seen during this recording. There was no focal slowing. There were no electrographic seizure identified.   Abnormality: None   Impression: This is a normal awake and drowsy EEG. No evidence of interictal epileptiform discharges. Normal EEGs, however, do not rule out epilepsy.    Romayne Ticas, MD [facility]

## 2024-06-08 ENCOUNTER — Ambulatory Visit (HOSPITAL_COMMUNITY)
Admission: RE | Admit: 2024-06-08 | Discharge: 2024-06-08 | Disposition: A | Discharge status: Home / Self Care | Source: Ambulatory Visit | Attending: Cardiology | Admitting: Cardiology

## 2024-06-08 DIAGNOSIS — I5020 Unspecified systolic (congestive) heart failure: Secondary | ICD-10-CM | POA: Diagnosis not present

## 2024-06-08 LAB — ECHOCARDIOGRAM COMPLETE
AR max vel: 0.51 cm2
AV Area VTI: 0.44 cm2
AV Area mean vel: 0.49 cm2
AV Mean grad: 16.6 mmHg
AV Peak grad: 28.2 mmHg
Ao pk vel: 2.65 m/s
Area-P 1/2: 4.97 cm2
P 1/2 time: 475 ms
S' Lateral: 3.9 cm

## 2024-06-10 NOTE — Progress Notes (Signed)
 EF remains low. Aortic valve stenosis severity is difficult to ascertain. Recommend Calcium  score scan of the aortic valve to see if it is >2000. If yes, that could be potential reason for low EF.  Thanks MJP

## 2024-06-21 ENCOUNTER — Ambulatory Visit (HOSPITAL_COMMUNITY)
Admission: RE | Admit: 2024-06-21 | Discharge: 2024-06-21 | Disposition: A | Discharge status: Home / Self Care | Source: Ambulatory Visit | Attending: Cardiology | Admitting: Cardiology

## 2024-06-21 DIAGNOSIS — I350 Nonrheumatic aortic (valve) stenosis: Secondary | ICD-10-CM | POA: Insufficient documentation

## 2024-06-21 DIAGNOSIS — I700 Atherosclerosis of aorta: Secondary | ICD-10-CM | POA: Insufficient documentation

## 2024-06-22 ENCOUNTER — Ambulatory Visit: Payer: Self-pay | Admitting: Cardiology

## 2024-06-22 NOTE — Progress Notes (Signed)
 Calcium  score was ordered for this patient to evaluate for aortic valve calcium  score.  At the time of ordering, we were asked to order regular calcium  score scan to the comment.  I do not see aortic valve calcium  score commented in this report.  Could you please addend the report?  Thank you MJP

## 2024-06-26 NOTE — Progress Notes (Signed)
 Aortic valve is not severely stenoseed. Calcification in heart arteries noted but recent heart catheterization shows that there is no severe stenoses. EF remains low, but medical mangaement is limited due to kidney dysfunction. Continue current medications. Fortunately, she looked stable from heart failure standpoint at last visit.  Thanks MJP

## 2024-07-06 DIAGNOSIS — H2513 Age-related nuclear cataract, bilateral: Secondary | ICD-10-CM | POA: Diagnosis not present

## 2024-07-06 DIAGNOSIS — H25043 Posterior subcapsular polar age-related cataract, bilateral: Secondary | ICD-10-CM | POA: Diagnosis not present

## 2024-07-06 DIAGNOSIS — H25013 Cortical age-related cataract, bilateral: Secondary | ICD-10-CM | POA: Diagnosis not present

## 2024-07-06 DIAGNOSIS — H2512 Age-related nuclear cataract, left eye: Secondary | ICD-10-CM | POA: Diagnosis not present

## 2024-07-06 DIAGNOSIS — H18413 Arcus senilis, bilateral: Secondary | ICD-10-CM | POA: Diagnosis not present

## 2024-07-19 DIAGNOSIS — E039 Hypothyroidism, unspecified: Secondary | ICD-10-CM | POA: Diagnosis not present

## 2024-07-19 DIAGNOSIS — D649 Anemia, unspecified: Secondary | ICD-10-CM | POA: Diagnosis not present

## 2024-07-19 DIAGNOSIS — Z1212 Encounter for screening for malignant neoplasm of rectum: Secondary | ICD-10-CM | POA: Diagnosis not present

## 2024-07-19 DIAGNOSIS — I5022 Chronic systolic (congestive) heart failure: Secondary | ICD-10-CM | POA: Diagnosis not present

## 2024-07-19 DIAGNOSIS — I130 Hypertensive heart and chronic kidney disease with heart failure and stage 1 through stage 4 chronic kidney disease, or unspecified chronic kidney disease: Secondary | ICD-10-CM | POA: Diagnosis not present

## 2024-07-19 DIAGNOSIS — N1831 Chronic kidney disease, stage 3a: Secondary | ICD-10-CM | POA: Diagnosis not present

## 2024-07-19 DIAGNOSIS — E559 Vitamin D deficiency, unspecified: Secondary | ICD-10-CM | POA: Diagnosis not present

## 2024-07-19 DIAGNOSIS — E785 Hyperlipidemia, unspecified: Secondary | ICD-10-CM | POA: Diagnosis not present

## 2024-07-26 DIAGNOSIS — Z1339 Encounter for screening examination for other mental health and behavioral disorders: Secondary | ICD-10-CM | POA: Diagnosis not present

## 2024-07-26 DIAGNOSIS — D6869 Other thrombophilia: Secondary | ICD-10-CM | POA: Diagnosis not present

## 2024-07-26 DIAGNOSIS — G4734 Idiopathic sleep related nonobstructive alveolar hypoventilation: Secondary | ICD-10-CM | POA: Diagnosis not present

## 2024-07-26 DIAGNOSIS — I2721 Secondary pulmonary arterial hypertension: Secondary | ICD-10-CM | POA: Diagnosis not present

## 2024-07-26 DIAGNOSIS — R443 Hallucinations, unspecified: Secondary | ICD-10-CM | POA: Diagnosis not present

## 2024-07-26 DIAGNOSIS — G319 Degenerative disease of nervous system, unspecified: Secondary | ICD-10-CM | POA: Diagnosis not present

## 2024-07-26 DIAGNOSIS — Z9189 Other specified personal risk factors, not elsewhere classified: Secondary | ICD-10-CM | POA: Diagnosis not present

## 2024-07-26 DIAGNOSIS — I5022 Chronic systolic (congestive) heart failure: Secondary | ICD-10-CM | POA: Diagnosis not present

## 2024-07-26 DIAGNOSIS — Z1331 Encounter for screening for depression: Secondary | ICD-10-CM | POA: Diagnosis not present

## 2024-07-26 DIAGNOSIS — E039 Hypothyroidism, unspecified: Secondary | ICD-10-CM | POA: Diagnosis not present

## 2024-07-26 DIAGNOSIS — I480 Paroxysmal atrial fibrillation: Secondary | ICD-10-CM | POA: Diagnosis not present

## 2024-07-26 DIAGNOSIS — I130 Hypertensive heart and chronic kidney disease with heart failure and stage 1 through stage 4 chronic kidney disease, or unspecified chronic kidney disease: Secondary | ICD-10-CM | POA: Diagnosis not present

## 2024-07-26 DIAGNOSIS — Z7901 Long term (current) use of anticoagulants: Secondary | ICD-10-CM | POA: Diagnosis not present

## 2024-07-26 DIAGNOSIS — N1831 Chronic kidney disease, stage 3a: Secondary | ICD-10-CM | POA: Diagnosis not present

## 2024-07-26 DIAGNOSIS — Z8709 Personal history of other diseases of the respiratory system: Secondary | ICD-10-CM | POA: Diagnosis not present

## 2024-07-26 DIAGNOSIS — Z72821 Inadequate sleep hygiene: Secondary | ICD-10-CM | POA: Diagnosis not present

## 2024-07-26 DIAGNOSIS — Z0000 Encounter for general adult medical examination without abnormal findings: Secondary | ICD-10-CM | POA: Diagnosis not present

## 2024-07-26 DIAGNOSIS — R82998 Other abnormal findings in urine: Secondary | ICD-10-CM | POA: Diagnosis not present

## 2024-07-26 DIAGNOSIS — E669 Obesity, unspecified: Secondary | ICD-10-CM | POA: Diagnosis not present

## 2024-07-26 DIAGNOSIS — H269 Unspecified cataract: Secondary | ICD-10-CM | POA: Diagnosis not present

## 2024-08-17 ENCOUNTER — Encounter: Payer: Self-pay | Admitting: Cardiology

## 2024-08-17 ENCOUNTER — Telehealth: Payer: Self-pay | Admitting: Cardiology

## 2024-08-17 NOTE — Telephone Encounter (Signed)
 Sorry to hear that.  Is she taking Lasix  40 mg daily at this time?  Recommend increasing Lasix  to 40 mg twice daily.  I would like to see her on my DOD day 08/23/2024.  1 PM appointment is okay with me.  Thanks MJP

## 2024-08-17 NOTE — Telephone Encounter (Signed)
 Pt c/o swelling/edema: STAT if pt has developed SOB within 24 hours  If swelling, where is the swelling located? Legs   How much weight have you gained and in what time span? No   Have you gained 2 pounds in a day or 5 pounds in a week? No   Do you have a log of your daily weights (if so, list)?  189 currently   Are you currently taking a fluid pill?  Yes   Are you currently SOB? Nothing currently, but has had sob some   Have you traveled recently in a car or plane for an extended period of time? No     Pt is following up on mychart message from today   Hi! This is Varie's daughter. Mom is a little concerned that her calves are swelling. She has been taking an extra fluid pill some days. Her weight is only changing a pound up or down over the days (so basically the same weight). When she puts her legs up and leans back, it makes her nauseous. We have been waiting since May for her cataract surgery which is in Oct and she is worried something will mess it up. We just wanted to let you know and see it you have any advice. thank you!

## 2024-08-17 NOTE — Telephone Encounter (Signed)
 Daughter (on [place]) contacted and she advised that patient has been taking lasix  daily and occasionally twice daily. Pt advised to increase dose to twice daily and appointment for Friday has been scheduled.

## 2024-08-20 ENCOUNTER — Encounter: Payer: Self-pay | Admitting: Cardiology

## 2024-08-20 ENCOUNTER — Ambulatory Visit: Attending: Cardiology | Admitting: Cardiology

## 2024-08-20 ENCOUNTER — Other Ambulatory Visit (HOSPITAL_COMMUNITY): Payer: Self-pay

## 2024-08-20 VITALS — BP 130/72 | HR 109 | Ht 66.0 in | Wt 190.0 lb

## 2024-08-20 DIAGNOSIS — E782 Mixed hyperlipidemia: Secondary | ICD-10-CM

## 2024-08-20 DIAGNOSIS — I4819 Other persistent atrial fibrillation: Secondary | ICD-10-CM | POA: Diagnosis not present

## 2024-08-20 DIAGNOSIS — I5023 Acute on chronic systolic (congestive) heart failure: Secondary | ICD-10-CM

## 2024-08-20 LAB — LIPID PANEL

## 2024-08-20 MED ORDER — FUROSEMIDE 40 MG PO TABS
ORAL_TABLET | ORAL | 3 refills | Status: DC
  Filled 2024-08-20: qty 200, fill #0

## 2024-08-20 MED ORDER — FUROSEMIDE 40 MG PO TABS
ORAL_TABLET | ORAL | 3 refills | Status: DC
  Filled 2024-08-20: qty 200, 67d supply, fill #0

## 2024-08-20 NOTE — Progress Notes (Signed)
 Cardiology Office Note:  .   Date:  08/20/2024  ID:  [name], DOB 1946-04-26, MRN [id number] PCP: Patricia Rush, MD  [facility] Providers Cardiologist:  Patricia Lawrence, MD PCP: Patricia Rush, MD  Chief Complaint  Patient presents with   Leg Swelling      History of Present Illness: .    [name] is a 78 y.o. female with hypertension, persistent atrial fibrillation, moderate obesity, pericardial effusion s/p pericardiocentesis (01/2021), new diagnosis HFrEF, severe WHO Grp II PH (08/2023)  Patient is here with her husband today with daughter joining on video call.  There has not been significant weight gain, but patient has had worsening leg swelling.  She also reports funny feeling in her stomach and shortness of breath when she tries to lay flat at night.  For last 3 days, she has taken Lasix  40 mg twice daily with some improvement in the leg swelling.  She denies any chest pain, presyncope or syncope.   Vitals:   08/20/24 1247  BP: 130/72  Pulse: (!) 109  SpO2: 96%       ROS:  Review of Systems  Cardiovascular:  Positive for dyspnea on exertion, leg swelling and orthopnea. Negative for chest pain, palpitations and syncope.     Studies Reviewed: [name]        EKG 02/09/2024: Atrial fibrillation 98 bpm  Left bundle branch block When compared with ECG of 09-Oct-2023 05:38, No significant change was found  CT cardiac scoring 06/2024: Aortic valve calcium  score: 891 (not severe)   Echocardiogram 06/2023:  1. The left ventricle demonstrates global and regional hypokinesis. The  entire septum and entire inferior wall are hypokinetic. [name] Left ventricular  ejection fraction, by estimation, is 30 to 35%. The left ventricle has  moderately decreased function. Left   ventricular diastolic function could not be evaluated.   2. Right ventricular systolic function is low normal. The right  ventricular size is normal. There is moderately elevated pulmonary artery   systolic pressure. The estimated right ventricular systolic pressure is  47.6 mmHg.   3. Left atrial size was severely dilated.   4. Right atrial size was mildly dilated.   5. The mitral valve is degenerative. Mild to moderate mitral valve  regurgitation. No evidence of mitral stenosis.   6. Tricuspid valve regurgitation is mild to moderate.   7. The aortic valve is grossly normal. Unable to determine aortic valve  morphology due to image quality. Aortic valve regurgitation is mild. low  flow low gradient aortic stenosis (peak velocity 2.45m/s, MG , AVA  VTI 0.46cm2, SVi 12, DI 0.15  -[id number]).   8. Pulmonic valve regurgitation is moderate.   9. The inferior vena cava is normal in size with greater than 50%  respiratory variability, suggesting right atrial pressure of 3 mmHg.    Right and left heart cath 09/01/2023: Angiographically mild to moderate diffuse disease in the LCx and LAD but not flow-limiting. Right dominant system. Moderate to Severe Secondary Pulmonary Hypertension With Pulmonary Venous Hypertension:  PAP-mean 69/34-47 mmHg with PCWP of 33- 35 mmHg and LVp-EDP 162/34-26 mmHg.  RAP mean 15 mmHg, RV P-EDP 66/9-15 mmHg Ao sat 94%, PA sat 61%.   Mildly reduced Cardiac Output 4.29, cardiac index 2.09     RECOMMENDATIONS: Patient likely requires additional IV diuresis.  Will return [facility] for ongoing diuresis . Continue to titrate GDMT for CHF Consider rhythm control for A-fib with bundle branch block.  Echocardiogram 09/10/2023: Left ventricular ejection fraction, by estimation, is 30 to 35%. The  left ventricle has moderately decreased function. The left ventricle  demonstrates global hypokinesis. There is mild left ventricular  hypertrophy. Left ventricular diastolic parameters are indeterminate.   2. Right ventricular systolic function is moderately reduced. The right  ventricular size is normal.   3. Left atrial size was severely dilated.    4. Right atrial size was severely dilated.   5. Moderate pleural effusion.   6. Trivial mitral valve regurgitation.   7. Tricuspid valve regurgitation is mild to moderate.   8. There is moderate calcification of the aortic valve. Aortic valve  regurgitation is mild. Mild aortic valve stenosis.   9. The inferior vena cava is dilated in size with >50% respiratory  variability, suggesting right atrial pressure of 8 mmHg.   Conclusion(s)/Recommendation(s): EF has declined compared to prior study.    Labs 12/2023: Hb 14.3 Cr 1.5, eGFR 35  09/09/2023: Chol 151, TG 97, HDL 46, LDL 86 Glucose 100, BUN/Cr 18/1.22. EGFR 45. Na/K 125/4.8.  H/H 14/44. MCV 84. Platelets 315  Risk Assessment/Calculations:    CHA2DS2-VASc Score = 5  This indicates a 7.2% annual risk of stroke. The patient's score is based upon: CHF History: 1 HTN History: 1 Diabetes History: 0 Stroke History: 0 Vascular Disease History: 0 Age Score: 2 Gender Score: 1    Physical Exam:   Physical Exam Vitals and nursing note reviewed.  Constitutional:      General: She is not in acute distress. Neck:     Vascular: No JVD.  Cardiovascular:     Rate and Rhythm: Normal rate. Rhythm irregular.     Heart sounds: Normal heart sounds. No murmur heard. Pulmonary:     Effort: Pulmonary effort is normal.     Breath sounds: Normal breath sounds. No wheezing or rales.  Musculoskeletal:     Right lower leg: Edema present.     Left lower leg: Edema (1+) present.      VISIT DIAGNOSES:   ICD-10-CM   1. Acute on chronic heart failure with reduced ejection fraction (HFrEF, <= 40%) (HCC)  I50.23 Basic metabolic panel with GFR    Pro b natriuretic peptide (BNP)    2. Mixed hyperlipidemia  E78.2 Lipid panel    3. Persistent atrial fibrillation (HCC)  I48.19           ASSESSMENT AND PLAN: .    [name] is a 78 y.o. female with hypertension, persistent atrial fibrillation, moderate obesity, pericardial  effusion s/p pericardiocentesis (01/2021), new diagnosis HFrEF, severe WHO Grp II PH (08/2023)  HFrEF: Acute on chronic heart failure exacerbation. EF 30-35% (09/2023). GDMT limited in the past due to renal dysfunction. Currently on Jardiance  10 mg daily, Lasix  40 mg daily, but has taken twice daily for last 3 days. Recommend Lasix  40 mg twice daily going forward, with additional dose of 40 mg as needed for leg edema. Check BMP, proBNP today. If renal function would allow, we could also consider adding MRA/ARNI/ARB. Okay to continue metoprolol  succinate 100 mg daily for now given that she has A-fib and holding of metoprolol  for acute decompensation may exacerbate her A-fib rate control. She remains at high risk of further decompensation and hospitalization. Recommend 1-2-week follow-up. Hopefully, we can get her compensated again before her cataract surgery on 09/08/2024.  Aortic stenosis: Although concern was raised if she has low-flow low gradient severe AS, her aortic valve calcium  score is only 891, thus  not severe. Unlikely that aortic stenosis is the cause of her cardiomyopathy. Will repeat echocardiogram in 12/2023.  Persistent Afib: Rate controlled on metoprolol  succinate 100 mg daily. Continue eliquis  5 mg bid.  Mixed hyperlipidemia: Currently on Crestor  20 mg daily.  Will check lipid panel today.   F/u in 1-2 weeks   Signed, Patricia JINNY Lawrence, MD

## 2024-08-20 NOTE — Patient Instructions (Addendum)
 Medication Instructions:  INCREASE Lasix  to 40 mg  Take 1 tablet (40 mg total) by mouth 2 (two) times daily. And may take additional 40 mg tablet as needed   *If you need a refill on your cardiac medications before your next appointment, please call your pharmacy*  Lab Work: BMP PROBNP Lipid panel   If you have labs (blood work) drawn today and your tests are completely normal, you will receive your results only by: MyChart Message (if you have MyChart) OR A paper copy in the mail If you have any lab test that is abnormal or we need to change your treatment, we will call you to review the results.  Follow-Up: At [facility], you and your health needs are our priority.  As part of our continuing mission to provide you with exceptional heart care, our providers are all part of one team.  This team includes your primary Cardiologist (physician) and Advanced Practice Providers or APPs (Physician Assistants and Nurse Practitioners) who all work together to provide you with the care you need, when you need it.  Your next appointment:   1-2 weeks   Provider:   Newman JINNY Lawrence, MD

## 2024-08-21 LAB — BASIC METABOLIC PANEL WITH GFR
BUN/Creatinine Ratio: 16 (ref 12–28)
BUN: 28 mg/dL — ABNORMAL HIGH (ref 8–27)
CO2: 25 mmol/L (ref 20–29)
Calcium: 9.4 mg/dL (ref 8.7–10.3)
Chloride: 95 mmol/L — ABNORMAL LOW (ref 96–106)
Creatinine, Ser: 1.77 mg/dL — ABNORMAL HIGH (ref 0.57–1.00)
Glucose: 93 mg/dL (ref 70–99)
Potassium: 3.5 mmol/L (ref 3.5–5.2)
Sodium: 139 mmol/L (ref 134–144)
eGFR: 29 mL/min/1.73 — ABNORMAL LOW (ref >59)

## 2024-08-21 LAB — LIPID PANEL
Chol/HDL Ratio: 2.5 ratio (ref 0.0–4.4)
Cholesterol, Total: 107 mg/dL (ref 100–199)
HDL: 42 mg/dL (ref >39)
LDL Chol Calc (NIH): 49 mg/dL (ref 0–99)
Triglycerides: 79 mg/dL (ref 0–149)
VLDL Cholesterol Cal: 16 mg/dL (ref 5–40)

## 2024-08-21 LAB — PRO B NATRIURETIC PEPTIDE: NT-Pro BNP: 12768 pg/mL — ABNORMAL HIGH (ref 0–738)

## 2024-08-22 ENCOUNTER — Ambulatory Visit: Payer: Self-pay | Admitting: Cardiology

## 2024-08-23 ENCOUNTER — Telehealth: Payer: Self-pay

## 2024-08-23 NOTE — Telephone Encounter (Signed)
 Patient was recently seen in the office on 08/20/2024 for CHF exacerbation. Per his note, She remains at high risk of further decompensation and hospitalization.Recommend 1-2-week follow-up. Hopefully, we can get her compensated again before her cataract surgery on 09/08/2024.  She is scheduled to see Orren Fabry, PA-C, on 08/30/2024. Risk stratification can be completed at that time. Will route this note to Orren so that she is aware and will had preop eval to appointment notes.  Will remove from pre-op pool.  Hymen Arnett E Develle Sievers, PA-C 08/23/2024 11:05 AM

## 2024-08-23 NOTE — Telephone Encounter (Signed)
   Pre-operative Risk Assessment    Patient Name: [name]  DOB: 1946-07-21 MRN: [id number]   Date of last office visit: 08/20/24 NEWMAN LAWRENCE, MD Date of next office visit: 08/30/24 TESSA CONTE, PA-C   Request for Surgical Clearance    Procedure:  CATARACT EXTRACTION W/ INTRAOCULAR LENS IMPLANTATION OF THE LEFT EYE FOLLOWED BY THE RIGHT EYE  Date of Surgery:  Clearance 09/08/24       AND  09/22/24                         Surgeon:  DR EVALENE BEVIS Surgeon's Group or Practice Name:  [facility] Phone number:  [phone] Fax number:  [phone]   Type of Clearance Requested:   - Medical  - Pharmacy:  Hold Apixaban  (Eliquis ) PER REQUEST PATIENT DOES NOT NEED TO STOP ANY MEDICATION   Type of Anesthesia:  TOPICAL ANESTHESIA W/ IV MEDICATION   Additional requests/questions:    Signed, Lucie DELENA Ku   08/23/2024, 10:36 AM

## 2024-08-25 ENCOUNTER — Other Ambulatory Visit: Payer: Self-pay | Admitting: Physician Assistant

## 2024-08-25 ENCOUNTER — Other Ambulatory Visit: Payer: Self-pay | Admitting: Cardiology

## 2024-08-25 DIAGNOSIS — I5023 Acute on chronic systolic (congestive) heart failure: Secondary | ICD-10-CM

## 2024-08-27 NOTE — Progress Notes (Signed)
 Cardiology Office Note   Date:  08/30/2024  ID:  [name], [name], [name], [name] PCP: Onita Rush, MD  [facility] Providers Cardiologist:  Newman JINNY Lawrence, MD   History of Present Illness [name] is a 78 y.o. female with a past medical history of hypertension, persistent atrial fibrillation, moderate obesity, pericardial effusion status post pericardiocentesis (01/2021), new diagnosis of HFrEF, severe WHO Grp II PH (08/2023) here for follow-up appointment.  At her last appointment on 08/20/2024 she reported feeling funny in her stomach and shortness of breath when she tries to lay flat at night.  For last 3 days she had taken Lasix  40 mg twice a day with some improvement in her leg swelling.  Denies any chest pain, syncope, or presyncope.  Today, she presents with a hx of heart failure with concerns about fluid retention and medication management. She also needs   She experiences fluid retention, particularly in her legs, with a burning sensation behind the knees. Her Lasix  dosage is 60 mg daily, taken in three doses. She has lost approximately 8 pounds over a few months and has noticed decreasing blood pressure.  She feels anxious, especially at night, leading to shortness of breath and a sensation of being 'smothered'. She experiences episodes of profuse sweating, similar to hot flashes.  Her medical history includes atrial fibrillation, managed with Eliquis  and metoprolol , and stable mild aortic valve stenosis. An echocardiogram in July assessed her heart function and valve status.  She uses a walker and can walk short distances but avoids activities like climbing stairs or extensive chores, with her husband assisting. She occasionally mows the lawn using a zero-turn mower.   ROS: Pertinent ROS in HPI  Studies Reviewed  Coronary CT scan 06/21/2024  Narrative & Impression  CLINICAL DATA:  Cardiovascular Disease Risk stratification   EXAM: Coronary  Calcium  Score   TECHNIQUE: A gated, non-contrast computed tomography scan of the heart was performed using 3mm slice thickness. Axial images were analyzed on a dedicated workstation. Calcium  scoring of the coronary arteries was performed using the Agatston method.   FINDINGS: Coronary arteries: Normal origins.   Coronary Calcium  Score:   Left main: 0   Left anterior descending artery: 829   Left circumflex artery: 276   Right coronary artery: 459   Total: 1565   Percentile: 96   Pericardium: Normal.   Ascending Aorta: Normal caliber.  Aortic atherosclerosis.   Non-cardiac: See separate report from [facility].   IMPRESSION: Coronary calcium  score of 1565. This was 70 percentile for age-, race-, and sex-matched controls.   Aortic atherosclerosis.   RECOMMENDATIONS: Coronary artery calcium  (CAC) score is a strong predictor of incident coronary heart disease (CHD) and provides predictive information beyond traditional risk factors. CAC scoring is reasonable to use in the decision to withhold, postpone, or initiate statin therapy in intermediate-risk or selected borderline-risk asymptomatic adults (age 46-75 years and LDL-C >=70 to <190 mg/dL) who do not have diabetes or established atherosclerotic cardiovascular disease (ASCVD).* In intermediate-risk (10-year ASCVD risk >=7.5% to <20%) adults or selected borderline-risk (10-year ASCVD risk >=5% to <7.5%) adults in whom a CAC score is measured for the purpose of making a treatment decision the following recommendations have been made:   If CAC=0, it is reasonable to withhold statin therapy and reassess in 5 to 10 years, as long as higher risk conditions are absent (diabetes mellitus, family history of premature CHD in first degree relatives (males <55 years; females <65 years),  cigarette smoking, or LDL >=190 mg/dL).   If CAC is 1 to 99, it is reasonable to initiate statin therapy for patients >=55 years  of age.   If CAC is >=100 or >=75th percentile, it is reasonable to initiate statin therapy at any age.   Cardiology referral should be considered for patients with CAC scores >=400 or >=75th percentile.   *2018 AHA/ACC/AACVPR/AAPA/ABC/ACPM/ADA/AGS/APhA/ASPC/NLA/PCNA Guideline on the Management of Blood Cholesterol: A Report of the American College of Cardiology/American Heart Association Task Force on Clinical Practice Guidelines. J Am Coll Cardiol. 2019;73(24):3168-3209.   Electronically Signed: By: Oneil Parchment M.D. On: 06/21/2024 16:36         CT cardiac scoring 06/2024: Aortic valve calcium  score: 891 (not severe)    Echocardiogram 06/2023:  1. The left ventricle demonstrates global and regional hypokinesis. The  entire septum and entire inferior wall are hypokinetic. SABRA Left ventricular  ejection fraction, by estimation, is 30 to 35%. The left ventricle has  moderately decreased function. Left   ventricular diastolic function could not be evaluated.   2. Right ventricular systolic function is low normal. The right  ventricular size is normal. There is moderately elevated pulmonary artery  systolic pressure. The estimated right ventricular systolic pressure is  47.6 mmHg.   3. Left atrial size was severely dilated.   4. Right atrial size was mildly dilated.   5. The mitral valve is degenerative. Mild to moderate mitral valve  regurgitation. No evidence of mitral stenosis.   6. Tricuspid valve regurgitation is mild to moderate.   7. The aortic valve is grossly normal. Unable to determine aortic valve  morphology due to image quality. Aortic valve regurgitation is mild. low  flow low gradient aortic stenosis (peak velocity 2.66m/s, MG , AVA  VTI 0.46cm2, SVi 12, DI 0.15  -[id number]).   8. Pulmonic valve regurgitation is moderate.   9. The inferior vena cava is normal in size with greater than 50%  respiratory variability, suggesting right atrial pressure of 3 mmHg.       Right and left heart cath 09/01/2023: Angiographically mild to moderate diffuse disease in the LCx and LAD but not flow-limiting. Right dominant system. Moderate to Severe Secondary Pulmonary Hypertension With Pulmonary Venous Hypertension:  PAP-mean 69/34-47 mmHg with PCWP of 33- 35 mmHg and LVp-EDP 162/34-26 mmHg.  RAP mean 15 mmHg, RV P-EDP 66/9-15 mmHg Ao sat 94%, PA sat 61%.   Mildly reduced Cardiac Output 4.29, cardiac index 2.09     RECOMMENDATIONS: Patient likely requires additional IV diuresis.  Will return to [facility] for ongoing diuresis . Continue to titrate GDMT for CHF Consider rhythm control for A-fib with bundle branch block.     Echocardiogram 09/10/2023: Left ventricular ejection fraction, by estimation, is 30 to 35%. The  left ventricle has moderately decreased function. The left ventricle  demonstrates global hypokinesis. There is mild left ventricular  hypertrophy. Left ventricular diastolic parameters are indeterminate.   2. Right ventricular systolic function is moderately reduced. The right  ventricular size is normal.   3. Left atrial size was severely dilated.   4. Right atrial size was severely dilated.   5. Moderate pleural effusion.   6. Trivial mitral valve regurgitation.   7. Tricuspid valve regurgitation is mild to moderate.   8. There is moderate calcification of the aortic valve. Aortic valve  regurgitation is mild. Mild aortic valve stenosis.   9. The inferior vena cava is dilated in size with >50% respiratory  variability, suggesting  right atrial pressure of 8 mmHg.   Conclusion(s)/Recommendation(s): EF has declined compared to prior study.    Risk Assessment/Calculations  CHA2DS2-VASc Score = 5  This indicates a 7.2% annual risk of stroke. The patient's score is based upon: CHF History: 1 HTN History: 1 Diabetes History: 0 Stroke History: 0 Vascular Disease History: 0 Age Score: 2 Gender Score: 1             Physical Exam VS:  BP 120/79   Ht 5' 6 (1.676 m)   Wt 182 lb (82.6 kg)   BMI 29.38 kg/m  HR fluctuating from the 50s normal sinus rhythm to A-fib in the 120s Oxygen saturation 99% on room air      Wt Readings from Last 3 Encounters:  08/30/24 182 lb (82.6 kg)  08/20/24 190 lb (86.2 kg)  05/27/24 190 lb (86.2 kg)    GEN: Well nourished, well developed in no acute distress NECK: No JVD; No carotid bruits CARDIAC: IRIR, no murmurs, rubs, gallops RESPIRATORY:  Clear to auscultation without rales, wheezing or rhonchi  ABDOMEN: Soft, non-tender, non-distended EXTREMITIES:  No edema; No deformity   ASSESSMENT AND PLAN  Preop clearance  [name]'s perioperative risk of a major cardiac event is 0.9% according to the Revised Cardiac Risk Index (RCRI).  Therefore, she is at low risk for perioperative complications.   Her functional capacity is good at 5.62 METs according to the Duke Activity Status Index (DASI). Recommendations: According to ACC/AHA guidelines, no further cardiovascular testing needed.  The patient may proceed to surgery at acceptable risk.    Acute on chronic systolic heart failure Fluid retention improved with reduced leg swelling. Shortness of breath likely anxiety-related. - Reduce Lasix  to 40 mg daily with an option for an additional 20 mg as needed. - Monitor weight and blood pressure regularly. - Contact clinic if leg swelling or other symptoms recur. -  Persistent atrial fibrillation Heart rate controlled with metoprolol . Asymptomatic regarding atrial fibrillation. - Continue Eliquis  for stroke prevention. - Continue metoprolol  100 mg daily. - Monitor heart rate and symptoms.  Mild nonrheumatic aortic valve stenosis Aortic valve stenosis remains mild. Focus on maintaining low LDL levels. - Continue current cholesterol management to maintain low LDL levels.  Mixed hyperlipidemia Managing cholesterol to prevent further valve calcification. - Continue  current cholesterol-lowering medications.  Anxiety symptoms Anxiety contributes to shortness of breath, exacerbated by poor eyesight and upcoming eye surgery. - Monitor anxiety symptoms and consider further management if symptoms persist.     Dispo: She can follow-up in 6 months with MD  Signed, Orren LOISE Fabry, PA-C

## 2024-08-30 ENCOUNTER — Encounter: Payer: Self-pay | Admitting: Physician Assistant

## 2024-08-30 ENCOUNTER — Ambulatory Visit: Attending: Physician Assistant | Admitting: Physician Assistant

## 2024-08-30 VITALS — BP 120/79 | Ht 66.0 in | Wt 182.0 lb

## 2024-08-30 DIAGNOSIS — E782 Mixed hyperlipidemia: Secondary | ICD-10-CM

## 2024-08-30 DIAGNOSIS — I5023 Acute on chronic systolic (congestive) heart failure: Secondary | ICD-10-CM

## 2024-08-30 DIAGNOSIS — I4819 Other persistent atrial fibrillation: Secondary | ICD-10-CM | POA: Diagnosis not present

## 2024-08-30 DIAGNOSIS — Z01810 Encounter for preprocedural cardiovascular examination: Secondary | ICD-10-CM

## 2024-08-30 DIAGNOSIS — I5020 Unspecified systolic (congestive) heart failure: Secondary | ICD-10-CM | POA: Diagnosis not present

## 2024-08-30 DIAGNOSIS — I350 Nonrheumatic aortic (valve) stenosis: Secondary | ICD-10-CM | POA: Diagnosis not present

## 2024-08-30 MED ORDER — FUROSEMIDE 40 MG PO TABS: ORAL_TABLET | ORAL | 1 refills | Status: DC

## 2024-08-30 NOTE — Patient Instructions (Addendum)
 Medication Instructions:  CHANGE Lasix  to 40mg  daily and you can take an additional half tablet as needed for fluid overload or swelling *If you need a refill on your cardiac medications before your next appointment, please call your pharmacy*  Lab Work: None ordered If you have labs (blood work) drawn today and your tests are completely normal, you will receive your results only by: MyChart Message (if you have MyChart) OR A paper copy in the mail If you have any lab test that is abnormal or we need to change your treatment, we will call you to review the results.  Testing/Procedures: None ordered  Follow-Up: At [facility], you and your health needs are our priority.  As part of our continuing mission to provide you with exceptional heart care, our providers are all part of one team.  This team includes your primary Cardiologist (physician) and Advanced Practice Providers or APPs (Physician Assistants and Nurse Practitioners) who all work together to provide you with the care you need, when you need it.  Your next appointment:   6 month(s)  Provider:   Newman JINNY Lawrence, MD    We recommend signing up for the patient portal called MyChart.  Sign up information is provided on this After Visit Summary.  MyChart is used to connect with patients for Virtual Visits (Telemedicine).  Patients are able to view lab/test results, encounter notes, upcoming appointments, etc.  Non-urgent messages can be sent to your provider as well.   To learn more about what you can do with MyChart, go to [email or web address].   Other Instructions

## 2024-09-08 DIAGNOSIS — H2512 Age-related nuclear cataract, left eye: Secondary | ICD-10-CM | POA: Diagnosis not present

## 2024-09-09 DIAGNOSIS — H25041 Posterior subcapsular polar age-related cataract, right eye: Secondary | ICD-10-CM | POA: Diagnosis not present

## 2024-09-09 DIAGNOSIS — H2511 Age-related nuclear cataract, right eye: Secondary | ICD-10-CM | POA: Diagnosis not present

## 2024-09-09 DIAGNOSIS — H25011 Cortical age-related cataract, right eye: Secondary | ICD-10-CM | POA: Diagnosis not present

## 2024-09-15 ENCOUNTER — Other Ambulatory Visit: Payer: Self-pay | Admitting: *Deleted

## 2024-09-15 DIAGNOSIS — I4819 Other persistent atrial fibrillation: Secondary | ICD-10-CM

## 2024-09-15 MED ORDER — APIXABAN 5 MG PO TABS: 5.0000 mg | ORAL_TABLET | Freq: Two times a day (BID) | ORAL | 5 refills | Status: DC

## 2024-09-15 NOTE — Telephone Encounter (Signed)
 Eliquis  5mg  refill request received. Patient is 78 years old, weight-82.6kg, Crea-1.77 on 08/20/24, Diagnosis-Afib, and last seen by Orren Fabry on 08/30/24. Dose is appropriate based on dosing criteria. Will send in refill to requested pharmacy.

## 2024-09-22 DIAGNOSIS — H2511 Age-related nuclear cataract, right eye: Secondary | ICD-10-CM | POA: Diagnosis not present

## 2024-09-27 DIAGNOSIS — I5022 Chronic systolic (congestive) heart failure: Secondary | ICD-10-CM | POA: Diagnosis not present

## 2024-09-27 DIAGNOSIS — I480 Paroxysmal atrial fibrillation: Secondary | ICD-10-CM | POA: Diagnosis not present

## 2024-09-27 DIAGNOSIS — N390 Urinary tract infection, site not specified: Secondary | ICD-10-CM | POA: Diagnosis not present

## 2024-09-27 DIAGNOSIS — N3001 Acute cystitis with hematuria: Secondary | ICD-10-CM | POA: Diagnosis not present

## 2024-09-27 DIAGNOSIS — H269 Unspecified cataract: Secondary | ICD-10-CM | POA: Diagnosis not present

## 2024-09-27 DIAGNOSIS — Z1389 Encounter for screening for other disorder: Secondary | ICD-10-CM | POA: Diagnosis not present

## 2024-09-27 DIAGNOSIS — R443 Hallucinations, unspecified: Secondary | ICD-10-CM | POA: Diagnosis not present

## 2024-09-29 ENCOUNTER — Encounter: Payer: Self-pay | Admitting: Cardiology

## 2024-09-29 ENCOUNTER — Inpatient Hospital Stay (HOSPITAL_COMMUNITY)

## 2024-09-29 ENCOUNTER — Inpatient Hospital Stay (HOSPITAL_COMMUNITY)
Admission: EM | Admit: 2024-09-29 | Discharge: 2024-10-09 | DRG: 291 | Disposition: A | Discharge status: Hospice | Attending: Internal Medicine | Admitting: Internal Medicine

## 2024-09-29 ENCOUNTER — Emergency Department (HOSPITAL_COMMUNITY)

## 2024-09-29 ENCOUNTER — Other Ambulatory Visit: Payer: Self-pay

## 2024-09-29 ENCOUNTER — Encounter (HOSPITAL_COMMUNITY): Payer: Self-pay | Admitting: Emergency Medicine

## 2024-09-29 DIAGNOSIS — E162 Hypoglycemia, unspecified: Secondary | ICD-10-CM | POA: Diagnosis not present

## 2024-09-29 DIAGNOSIS — I700 Atherosclerosis of aorta: Secondary | ICD-10-CM | POA: Diagnosis not present

## 2024-09-29 DIAGNOSIS — I4821 Permanent atrial fibrillation: Secondary | ICD-10-CM | POA: Diagnosis not present

## 2024-09-29 DIAGNOSIS — Z452 Encounter for adjustment and management of vascular access device: Secondary | ICD-10-CM | POA: Diagnosis not present

## 2024-09-29 DIAGNOSIS — R4182 Altered mental status, unspecified: Secondary | ICD-10-CM | POA: Diagnosis not present

## 2024-09-29 DIAGNOSIS — E860 Dehydration: Secondary | ICD-10-CM | POA: Diagnosis present

## 2024-09-29 DIAGNOSIS — E039 Hypothyroidism, unspecified: Secondary | ICD-10-CM | POA: Diagnosis not present

## 2024-09-29 DIAGNOSIS — J849 Interstitial pulmonary disease, unspecified: Secondary | ICD-10-CM | POA: Diagnosis not present

## 2024-09-29 DIAGNOSIS — R570 Cardiogenic shock: Secondary | ICD-10-CM

## 2024-09-29 DIAGNOSIS — R400 Somnolence: Secondary | ICD-10-CM | POA: Diagnosis not present

## 2024-09-29 DIAGNOSIS — I5023 Acute on chronic systolic (congestive) heart failure: Secondary | ICD-10-CM | POA: Diagnosis not present

## 2024-09-29 DIAGNOSIS — R29702 NIHSS score 2: Secondary | ICD-10-CM | POA: Diagnosis present

## 2024-09-29 DIAGNOSIS — I6340 Cerebral infarction due to embolism of unspecified cerebral artery: Secondary | ICD-10-CM | POA: Diagnosis not present

## 2024-09-29 DIAGNOSIS — I130 Hypertensive heart and chronic kidney disease with heart failure and stage 1 through stage 4 chronic kidney disease, or unspecified chronic kidney disease: Secondary | ICD-10-CM | POA: Diagnosis not present

## 2024-09-29 DIAGNOSIS — J90 Pleural effusion, not elsewhere classified: Secondary | ICD-10-CM | POA: Diagnosis not present

## 2024-09-29 DIAGNOSIS — E872 Acidosis, unspecified: Secondary | ICD-10-CM | POA: Diagnosis present

## 2024-09-29 DIAGNOSIS — N1832 Chronic kidney disease, stage 3b: Secondary | ICD-10-CM | POA: Diagnosis present

## 2024-09-29 DIAGNOSIS — I2510 Atherosclerotic heart disease of native coronary artery without angina pectoris: Secondary | ICD-10-CM | POA: Diagnosis not present

## 2024-09-29 DIAGNOSIS — I509 Heart failure, unspecified: Secondary | ICD-10-CM | POA: Diagnosis not present

## 2024-09-29 DIAGNOSIS — I517 Cardiomegaly: Secondary | ICD-10-CM | POA: Diagnosis not present

## 2024-09-29 DIAGNOSIS — R0602 Shortness of breath: Secondary | ICD-10-CM | POA: Diagnosis not present

## 2024-09-29 DIAGNOSIS — I428 Other cardiomyopathies: Secondary | ICD-10-CM | POA: Diagnosis present

## 2024-09-29 DIAGNOSIS — E785 Hyperlipidemia, unspecified: Secondary | ICD-10-CM | POA: Diagnosis present

## 2024-09-29 DIAGNOSIS — N179 Acute kidney failure, unspecified: Secondary | ICD-10-CM | POA: Diagnosis present

## 2024-09-29 DIAGNOSIS — E876 Hypokalemia: Secondary | ICD-10-CM | POA: Diagnosis present

## 2024-09-29 DIAGNOSIS — I2489 Other forms of acute ischemic heart disease: Secondary | ICD-10-CM | POA: Diagnosis present

## 2024-09-29 DIAGNOSIS — I501 Left ventricular failure: Secondary | ICD-10-CM | POA: Diagnosis not present

## 2024-09-29 DIAGNOSIS — I5043 Acute on chronic combined systolic (congestive) and diastolic (congestive) heart failure: Secondary | ICD-10-CM | POA: Diagnosis present

## 2024-09-29 DIAGNOSIS — I110 Hypertensive heart disease with heart failure: Secondary | ICD-10-CM | POA: Diagnosis not present

## 2024-09-29 DIAGNOSIS — E871 Hypo-osmolality and hyponatremia: Secondary | ICD-10-CM | POA: Diagnosis not present

## 2024-09-29 DIAGNOSIS — W19XXXA Unspecified fall, initial encounter: Secondary | ICD-10-CM | POA: Diagnosis present

## 2024-09-29 DIAGNOSIS — I447 Left bundle-branch block, unspecified: Secondary | ICD-10-CM | POA: Diagnosis present

## 2024-09-29 DIAGNOSIS — I082 Rheumatic disorders of both aortic and tricuspid valves: Secondary | ICD-10-CM | POA: Diagnosis present

## 2024-09-29 DIAGNOSIS — Z6832 Body mass index (BMI) 32.0-32.9, adult: Secondary | ICD-10-CM

## 2024-09-29 DIAGNOSIS — Z7901 Long term (current) use of anticoagulants: Secondary | ICD-10-CM | POA: Diagnosis not present

## 2024-09-29 DIAGNOSIS — J984 Other disorders of lung: Secondary | ICD-10-CM | POA: Diagnosis not present

## 2024-09-29 DIAGNOSIS — Z79890 Hormone replacement therapy (postmenopausal): Secondary | ICD-10-CM

## 2024-09-29 DIAGNOSIS — R059 Cough, unspecified: Secondary | ICD-10-CM | POA: Diagnosis not present

## 2024-09-29 DIAGNOSIS — G928 Other toxic encephalopathy: Secondary | ICD-10-CM | POA: Diagnosis not present

## 2024-09-29 DIAGNOSIS — N189 Chronic kidney disease, unspecified: Secondary | ICD-10-CM | POA: Diagnosis not present

## 2024-09-29 DIAGNOSIS — R569 Unspecified convulsions: Secondary | ICD-10-CM | POA: Diagnosis not present

## 2024-09-29 DIAGNOSIS — I5020 Unspecified systolic (congestive) heart failure: Secondary | ICD-10-CM | POA: Diagnosis not present

## 2024-09-29 DIAGNOSIS — Z66 Do not resuscitate: Secondary | ICD-10-CM | POA: Diagnosis not present

## 2024-09-29 DIAGNOSIS — Z888 Allergy status to other drugs, medicaments and biological substances status: Secondary | ICD-10-CM

## 2024-09-29 DIAGNOSIS — Z0389 Encounter for observation for other suspected diseases and conditions ruled out: Secondary | ICD-10-CM | POA: Diagnosis not present

## 2024-09-29 DIAGNOSIS — I272 Pulmonary hypertension, unspecified: Secondary | ICD-10-CM | POA: Diagnosis not present

## 2024-09-29 DIAGNOSIS — N390 Urinary tract infection, site not specified: Secondary | ICD-10-CM

## 2024-09-29 DIAGNOSIS — Z823 Family history of stroke: Secondary | ICD-10-CM

## 2024-09-29 DIAGNOSIS — R918 Other nonspecific abnormal finding of lung field: Secondary | ICD-10-CM | POA: Diagnosis not present

## 2024-09-29 DIAGNOSIS — Z7984 Long term (current) use of oral hypoglycemic drugs: Secondary | ICD-10-CM

## 2024-09-29 DIAGNOSIS — Z7189 Other specified counseling: Secondary | ICD-10-CM

## 2024-09-29 DIAGNOSIS — I6782 Cerebral ischemia: Secondary | ICD-10-CM | POA: Diagnosis not present

## 2024-09-29 DIAGNOSIS — Z79899 Other long term (current) drug therapy: Secondary | ICD-10-CM

## 2024-09-29 DIAGNOSIS — I5021 Acute systolic (congestive) heart failure: Secondary | ICD-10-CM | POA: Diagnosis not present

## 2024-09-29 DIAGNOSIS — Z8249 Family history of ischemic heart disease and other diseases of the circulatory system: Secondary | ICD-10-CM

## 2024-09-29 DIAGNOSIS — J479 Bronchiectasis, uncomplicated: Secondary | ICD-10-CM | POA: Diagnosis not present

## 2024-09-29 DIAGNOSIS — Z96652 Presence of left artificial knee joint: Secondary | ICD-10-CM | POA: Diagnosis present

## 2024-09-29 DIAGNOSIS — I639 Cerebral infarction, unspecified: Secondary | ICD-10-CM | POA: Diagnosis not present

## 2024-09-29 HISTORY — DX: Pleural effusion, not elsewhere classified: J90

## 2024-09-29 HISTORY — DX: Other cardiomyopathies: I42.8

## 2024-09-29 HISTORY — DX: Nonrheumatic aortic (valve) stenosis: I35.0

## 2024-09-29 HISTORY — DX: Atherosclerotic heart disease of native coronary artery without angina pectoris: I25.10

## 2024-09-29 HISTORY — DX: Other pericardial effusion (noninflammatory): I31.39

## 2024-09-29 HISTORY — DX: Rheumatic tricuspid insufficiency: I07.1

## 2024-09-29 HISTORY — DX: Nonrheumatic mitral (valve) insufficiency: I34.0

## 2024-09-29 HISTORY — DX: Pulmonary hypertension, unspecified: I27.20

## 2024-09-29 LAB — RESPIRATORY PANEL BY PCR

## 2024-09-29 LAB — COMPREHENSIVE METABOLIC PANEL WITH GFR
ALT: 22 U/L (ref 0–44)
AST: 24 U/L (ref 15–41)
Albumin: 3.7 g/dL (ref 3.5–5.0)
Alkaline Phosphatase: 41 U/L (ref 38–126)
Anion gap: 19 — ABNORMAL HIGH (ref 5–15)
BUN: 50 mg/dL — ABNORMAL HIGH (ref 8–23)
CO2: 24 mmol/L (ref 22–32)
Calcium: 8.7 mg/dL — ABNORMAL LOW (ref 8.9–10.3)
Chloride: 89 mmol/L — ABNORMAL LOW (ref 98–111)
Creatinine, Ser: 2.55 mg/dL — ABNORMAL HIGH (ref 0.44–1.00)
GFR, Estimated: 19 mL/min — ABNORMAL LOW (ref >60)
Glucose, Bld: 83 mg/dL (ref 70–99)
Potassium: 3.4 mmol/L — ABNORMAL LOW (ref 3.5–5.1)
Sodium: 132 mmol/L — ABNORMAL LOW (ref 135–145)
Total Bilirubin: 0.7 mg/dL (ref 0.0–1.2)
Total Protein: 5.4 g/dL — ABNORMAL LOW (ref 6.5–8.1)

## 2024-09-29 LAB — CBC WITH DIFFERENTIAL/PLATELET
Abs Immature Granulocytes: 0.04 K/uL (ref 0.00–0.07)
Basophils Absolute: 0.1 K/uL (ref 0.0–0.1)
Basophils Relative: 1 %
Eosinophils Absolute: 0.1 K/uL (ref 0.0–0.5)
Eosinophils Relative: 1 %
HCT: 42.6 % (ref 36.0–46.0)
Hemoglobin: 13.6 g/dL (ref 12.0–15.0)
Immature Granulocytes: 0 %
Lymphocytes Relative: 10 %
Lymphs Abs: 1 K/uL (ref 0.7–4.0)
MCH: 26.2 pg (ref 26.0–34.0)
MCHC: 31.9 g/dL (ref 30.0–36.0)
MCV: 82.1 fL (ref 80.0–100.0)
Monocytes Absolute: 0.7 K/uL (ref 0.1–1.0)
Monocytes Relative: 7 %
Neutro Abs: 8.4 K/uL — ABNORMAL HIGH (ref 1.7–7.7)
Neutrophils Relative %: 81 %
Platelets: 258 K/uL (ref 150–400)
RBC: 5.19 MIL/uL — ABNORMAL HIGH (ref 3.87–5.11)
RDW: 17 % — ABNORMAL HIGH (ref 11.5–15.5)
WBC: 10.3 K/uL (ref 4.0–10.5)
nRBC: 0 % (ref 0.0–0.2)

## 2024-09-29 LAB — TROPONIN I (HIGH SENSITIVITY)
Troponin I (High Sensitivity): 33 ng/L — ABNORMAL HIGH (ref <18)
Troponin I (High Sensitivity): 35 ng/L — ABNORMAL HIGH (ref <18)

## 2024-09-29 LAB — URINALYSIS, W/ REFLEX TO CULTURE (INFECTION SUSPECTED)
Bilirubin Urine: NEGATIVE
Glucose, UA: 150 mg/dL — AB
Ketones, ur: NEGATIVE mg/dL
Nitrite: NEGATIVE
Protein, ur: NEGATIVE mg/dL
Specific Gravity, Urine: 1.012 (ref 1.005–1.030)
pH: 5 (ref 5.0–8.0)

## 2024-09-29 LAB — I-STAT CG4 LACTIC ACID, ED
Lactic Acid, Venous: 2.2 mmol/L (ref 0.5–1.9)
Lactic Acid, Venous: 1 mmol/L (ref 0.5–1.9)
Lactic Acid, Venous: 3.2 mmol/L (ref 0.5–1.9)

## 2024-09-29 LAB — I-STAT VENOUS BLOOD GAS, ED
Acid-Base Excess: 1 mmol/L (ref 0.0–2.0)
Bicarbonate: 21.3 mmol/L (ref 20.0–28.0)
Calcium, Ion: 0.92 mmol/L — ABNORMAL LOW (ref 1.15–1.40)
HCT: 44 % (ref 36.0–46.0)
Hemoglobin: 15 g/dL (ref 12.0–15.0)
O2 Saturation: 100 %
Potassium: 4.3 mmol/L (ref 3.5–5.1)
Sodium: 130 mmol/L — ABNORMAL LOW (ref 135–145)
TCO2: 22 mmol/L (ref 22–32)
pCO2, Ven: 23.2 mmHg — ABNORMAL LOW (ref 44–60)
pH, Ven: 7.571 — ABNORMAL HIGH (ref 7.25–7.43)
pO2, Ven: 204 mmHg — ABNORMAL HIGH (ref 32–45)

## 2024-09-29 LAB — DIGOXIN LEVEL: Digoxin Level: <0.6 ng/mL — ABNORMAL LOW (ref 0.8–2.0)

## 2024-09-29 LAB — HEMOGLOBIN A1C
Hgb A1c MFr Bld: 5.9 % — ABNORMAL HIGH (ref 4.8–5.6)
Mean Plasma Glucose: 122.63 mg/dL

## 2024-09-29 LAB — BRAIN NATRIURETIC PEPTIDE: B Natriuretic Peptide: 762.4 pg/mL — ABNORMAL HIGH (ref 0.0–100.0)

## 2024-09-29 LAB — MAGNESIUM: Magnesium: 2.4 mg/dL (ref 1.7–2.4)

## 2024-09-29 LAB — AMMONIA: Ammonia: 19 umol/L (ref 9–35)

## 2024-09-29 MED ORDER — CEFTRIAXONE SODIUM 1 G IJ SOLR
1.0000 g | Freq: Once | INTRAMUSCULAR | Status: AC
  Administered 2024-09-29: 1 g via INTRAVENOUS
  Filled 2024-09-29: qty 10

## 2024-09-29 MED ORDER — LACTATED RINGERS IV BOLUS: 500.0000 mL | Freq: Once | INTRAVENOUS | Status: DC

## 2024-09-29 MED ORDER — ACETAMINOPHEN 650 MG RE SUPP: 650.0000 mg | Freq: Four times a day (QID) | RECTAL | Status: DC | PRN

## 2024-09-29 MED ORDER — ACETAMINOPHEN 325 MG PO TABS: 650.0000 mg | ORAL_TABLET | Freq: Four times a day (QID) | ORAL | Status: DC | PRN

## 2024-09-29 MED ORDER — SODIUM CHLORIDE 0.9 % IV SOLN
Freq: Once | INTRAVENOUS | Status: AC
Start: 2024-09-29 — End: 2024-09-29

## 2024-09-29 MED ORDER — SODIUM CHLORIDE 0.9 % IV SOLN
2.0000 g | INTRAVENOUS | Status: DC
  Administered 2024-09-30: 2 g via INTRAVENOUS
  Filled 2024-09-29: qty 20

## 2024-09-29 MED ORDER — FUROSEMIDE 10 MG/ML IJ SOLN
40.0000 mg | Freq: Once | INTRAMUSCULAR | Status: AC
  Administered 2024-09-29: 40 mg via INTRAVENOUS
  Filled 2024-09-29: qty 4

## 2024-09-29 MED ORDER — POTASSIUM CHLORIDE CRYS ER 20 MEQ PO TBCR
20.0000 meq | EXTENDED_RELEASE_TABLET | Freq: Every day | ORAL | Status: DC
  Administered 2024-09-29 – 2024-10-01 (×3): 20 meq via ORAL
  Filled 2024-09-29 (×3): qty 1

## 2024-09-29 MED ORDER — FUROSEMIDE 10 MG/ML IJ SOLN
40.0000 mg | Freq: Two times a day (BID) | INTRAMUSCULAR | Status: DC
  Administered 2024-09-29 – 2024-09-30 (×3): 40 mg via INTRAVENOUS
  Filled 2024-09-29 (×3): qty 4

## 2024-09-29 MED ORDER — SODIUM CHLORIDE 0.9% FLUSH
3.0000 mL | Freq: Two times a day (BID) | INTRAVENOUS | Status: DC
  Administered 2024-09-30 – 2024-10-09 (×14): 3 mL via INTRAVENOUS

## 2024-09-29 NOTE — ED Notes (Signed)
 Patient transported to MRI

## 2024-09-29 NOTE — ED Notes (Signed)
 Assisted pt to bedside commode. Urine obtained and assisted pt back into bed. Call bell in reach. Blankets provided. TV on no questions or concerns at this time.

## 2024-09-29 NOTE — ED Provider Notes (Signed)
 [place] EMERGENCY DEPARTMENT AT [facility] Provider Note   CSN: [id number] Arrival date & time: 09/29/24  1225     Patient presents with: Shortness of Breath, Fall, and Leg Swelling   [name] is a 78 y.o. female.   78 year old female presents for evaluation of fatigue and shortness of breath.  She has had increased leg swelling and has a history of CHF as well as elevated ammonia levels.  Family states she has been sleeping more often, and seem very much more confused than usual.  States last time this happened her ammonia was high but they are also worried about urinary tract infection.  Patient states she has been compliant with her diuretic.   Shortness of Breath Associated symptoms: no abdominal pain, no chest pain, no cough, no ear pain, no fever, no rash, no sore throat and no vomiting   Fall Associated symptoms include shortness of breath. Pertinent negatives include no chest pain and no abdominal pain.       Prior to Admission medications   Medication Sig Start Date End Date Taking? Authorizing Provider  acetaminophen  (TYLENOL ) 325 MG tablet Take 325-650 mg by mouth daily as needed for mild pain or headache.    [provider]  apixaban  (ELIQUIS ) 5 MG TABS tablet Take 1 tablet (5 mg total) by mouth 2 (two) times daily. 09/15/24   Patwardhan, Newman PARAS, MD  Calcium  Carb-Cholecalciferol  (CALTRATE 600+D3 PO) Take 1 tablet by mouth every evening.    [provider]  [employer] IRON 325 (65 Fe) MG tablet Take 325 mg by mouth daily at 6 PM. 01/16/21   [provider]  empagliflozin  (JARDIANCE ) 10 MG TABS tablet Take 1 tablet (10 mg total) by mouth daily. 10/27/23   Patwardhan, Newman PARAS, MD  furosemide  (LASIX ) 40 MG tablet Take 1 tablet daily and can take an additional half tablet as needed for swelling or fluid overload 08/30/24   Lucien Orren SAILOR, PA-C  isosorbide  mononitrate (IMDUR ) 30 MG 24 hr tablet TAKE 1 TABLET BY MOUTH EVERY DAY 08/25/24    Patwardhan, Manish J, MD  lactulose  (CHRONULAC ) 10 GM/15ML solution Take 15 mLs (10 g total) by mouth every other day. 10/14/23   Fairy Frames, MD  levothyroxine  (SYNTHROID , LEVOTHROID) 125 MCG tablet Take 125 mcg by mouth daily before breakfast. 06/19/12   [provider]  metoprolol  succinate (TOPROL -XL) 100 MG 24 hr tablet TAKE 1 TABLET BY MOUTH DAILY. TAKE WITH OR IMMEDIATELY FOLLOWING A MEAL. 08/25/24   Lelon Hamilton T, PA-C  omeprazole  (PRILOSEC) 20 MG capsule TAKE 1 CAPSULE BY MOUTH EVERY DAY Patient taking differently: Take 20 mg by mouth daily before breakfast. 12/21/21   Abran Norleen SAILOR, MD  Oxymetazoline HCl (SINEX ULTRA FINE MIST 12-HOUR NA) Place 1 spray into both nostrils every 12 (twelve) hours as needed (for congestion).    [provider]  rosuvastatin  (CRESTOR ) 20 MG tablet Take 1 tablet (20 mg total) by mouth daily. 05/11/24   Patwardhan, Newman PARAS, MD  VISINE 0.025-0.3 % ophthalmic solution Place 1 drop into both eyes 2 (two) times daily as needed for eye irritation.    [provider]  vitamin B-12 (CYANOCOBALAMIN ) 1000 MCG tablet Take 1,000 mcg by mouth daily. 01/16/21   [provider]    Allergies: Spironolactone  and Atorvastatin    Review of Systems  Constitutional:  Positive for fatigue. Negative for chills and fever.  HENT:  Negative for ear pain and sore throat.   Eyes:  Negative for pain and visual disturbance.  Respiratory:  Positive for shortness of breath. Negative for cough.   Cardiovascular:  Positive for leg swelling. Negative for chest pain and palpitations.  Gastrointestinal:  Negative for abdominal pain and vomiting.  Genitourinary:  Negative for dysuria and hematuria.  Musculoskeletal:  Negative for arthralgias and back pain.  Skin:  Negative for color change and rash.  Neurological:  Negative for seizures and syncope.  All other systems reviewed and are negative.   Updated Vital Signs BP 137/89   Pulse 69   Temp  97.9 F (36.6 C) (Oral)   Resp 20   SpO2 97%   Physical Exam Vitals and nursing note reviewed.  Constitutional:      General: She is not in acute distress.    Appearance: She is well-developed. She is ill-appearing.     Comments: Chronically ill appearing  HENT:     Head: Normocephalic and atraumatic.  Eyes:     Conjunctiva/sclera: Conjunctivae normal.  Cardiovascular:     Rate and Rhythm: Normal rate and regular rhythm.     Heart sounds: No murmur heard. Pulmonary:     Effort: Pulmonary effort is normal. No respiratory distress.     Breath sounds: Decreased breath sounds present.  Abdominal:     Palpations: Abdomen is soft.     Tenderness: There is no abdominal tenderness.  Musculoskeletal:        General: No swelling.     Cervical back: Neck supple.     Right lower leg: Edema present.     Left lower leg: Edema present.  Skin:    General: Skin is warm and dry.     Capillary Refill: Capillary refill takes less than 2 seconds.  Neurological:     Mental Status: She is alert.  Psychiatric:        Mood and Affect: Mood normal.     (all labs ordered are listed, but only abnormal results are displayed) Labs Reviewed  CBC WITH DIFFERENTIAL/PLATELET - Abnormal; Notable for the following components:      Result Value   RBC 5.19 (*)    RDW 17.0 (*)    Neutro Abs 8.4 (*)    All other components within normal limits  COMPREHENSIVE METABOLIC PANEL WITH GFR - Abnormal; Notable for the following components:   Sodium 132 (*)    Potassium 3.4 (*)    Chloride 89 (*)    BUN 50 (*)    Creatinine, Ser 2.55 (*)    Calcium  8.7 (*)    Total Protein 5.4 (*)    GFR, Estimated 19 (*)    Anion gap 19 (*)    All other components within normal limits  URINALYSIS, W/ REFLEX TO CULTURE (INFECTION SUSPECTED) - Abnormal; Notable for the following components:   APPearance HAZY (*)    Glucose, UA 150 (*)    Hgb urine dipstick SMALL (*)    Leukocytes,Ua LARGE (*)    Bacteria, UA RARE (*)     All other components within normal limits  BRAIN NATRIURETIC PEPTIDE - Abnormal; Notable for the following components:   B Natriuretic Peptide 762.4 (*)    All other components within normal limits  TROPONIN I (HIGH SENSITIVITY) - Abnormal; Notable for the following components:   Troponin I (High Sensitivity) 33 (*)    All other components within normal limits  RESPIRATORY PANEL BY PCR  AMMONIA  I-STAT CG4 LACTIC ACID, ED  I-STAT CG4 LACTIC ACID, ED  TROPONIN I (  HIGH SENSITIVITY)    EKG: EKG Interpretation Date/Time:  Wednesday September 29 2024 12:43:56 EDT Ventricular Rate:  90 PR Interval:    QRS Duration:  142 QT Interval:  472 QTC Calculation: 577 R Axis:   41  Text Interpretation: Atrial fibrillation Left bundle branch block  Compared with prior EKG from 02/09/2024 Confirmed by Gennaro Bouchard ([id number]) on 09/29/2024 12:49:36 PM  Radiology: [facility] Chest 1 View Result Date: 09/29/2024 EXAM: 1 VIEW(S) XRAY OF THE CHEST 09/29/2024 01:22:00 PM COMPARISON: 10/08/2023 CLINICAL HISTORY: Cough, shortness of breath. FINDINGS: LUNGS AND PLEURA: Interstitial prominence again noted throughout the lungs similar to prior study, favor scarring. Patchy airspace opacities peripherally in the left mid lung and lung base slightly increased since prior study, favor chronic scarring although acute infiltrate cannot be excluded. No pulmonary edema. No pleural effusion. No pneumothorax. HEART AND MEDIASTINUM: No acute abnormality of the cardiac and mediastinal silhouettes. Aortic atherosclerosis BONES AND SOFT TISSUES: No acute osseous abnormality. IMPRESSION: 1. Interstitial prominence throughout the lungs, similar to prior study, favoring scarring. 2. Patchy peripheral opacities in the left mid to lower lung, slightly increased from prior, favored chronic scarring; superimposed acute infiltrate cannot be excluded. Electronically signed by: Franky Crease MD 09/29/2024 02:29 PM EDT RP Workstation: [id number]      Procedures   Medications Ordered in the ED  furosemide  (LASIX ) injection 40 mg (has no administration in time range)  cefTRIAXone  (ROCEPHIN ) 1 g in sodium chloride  0.9 % 100 mL IVPB (has no administration in time range)                                    Medical Decision Making Cardiac monitor interpretation: Sinus rhythm, no ectopy  Patient's lab workup reviewed by me she has a slight elevation in troponin, still has a UTI and elevated BNP.  She is Some possible infiltrates in her chest x-ray and so CT chest was ordered.  Does appear very fatigued on my exam but awake alert and can answer most questions appropriately.  Will start her on Rocephin  for UTI and also for possible pneumonia.  Was given a dose of Lasix  as well.  She does have a mild AKI and is retaining fluid in her legs.  I discussed patient's case with hospitalist, Dr. Tobie, and patient will be admitted for further workup and management.  All results and plan discussed with patient and family at bedside and they are agreeable with plan for admission  Problems Addressed: AKI (acute kidney injury): acute illness or injury Altered mental status, unspecified altered mental status type: acute illness or injury Congestive heart failure, unspecified HF chronicity, unspecified heart failure type [facility]): acute illness or injury  Amount and/or Complexity of Data Reviewed External Data Reviewed: notes.    Details: Outpatient records reviewed and patient has been on Bactrim for urinary tract infection Labs: ordered. Decision-making details documented in ED Course.    Details: Ordered and reviewed by me and patient with slightly elevated troponin, BNP and AKI Radiology: ordered and independent interpretation performed. Decision-making details documented in ED Course.    Details: Ordered and interpreted by me independently of radiology CT head: Shows no acute intracranial process Chest x-ray: Shows possible infiltrates in the  bases  CT chest pending ECG/medicine tests: ordered and independent interpretation performed. Decision-making details documented in ED Course.    Details: Ordered and interpreted by me in the absence of cardiology and shows sinus  rhythm, no STEMI or significant change when compared to prior  Risk OTC drugs. Prescription drug management. Drug therapy requiring intensive monitoring for toxicity. Decision regarding hospitalization.     Final diagnoses:  Altered mental status, unspecified altered mental status type  AKI (acute kidney injury)  Congestive heart failure, unspecified HF chronicity, unspecified heart failure type [facility])  Urinary tract infection without hematuria, site unspecified    ED Discharge Orders     None          Gennaro Duwaine CROME, DO 09/29/24 1537

## 2024-09-29 NOTE — ED Triage Notes (Signed)
 FAmily added that pt had cataract surgery last week and has seemed foggier since the sedation.

## 2024-09-29 NOTE — Consult Note (Addendum)
 Cardiology Consult    Patient ID: [name] MRN: [id number], DOB/AGE: 02-25-1946   Admit date: 09/29/2024 Date of Consult: 09/29/2024  Primary Physician: Onita Rush, MD Primary Cardiologist: Newman JINNY Lawrence, MD  Cardiology APP:  Lucien Orren SAILOR, PA-C  Requesting Provider: CHARLENA Blanch, MD  Patient Profile    [name] is a 78 y.o. female with a history of nonischemic cardiomyopathy, chronic HFrEF, nonobstructive CAD, permanent atrial fibrillation, aortic stenosis, mitral/tricuspid regurgitation, hypertension, hyperlipidemia, stage III chronic kidney disease, history of pericardial effusion and pleural effusion status post pericardiocentesis/thoracentesis in 2022, pulmonary hypertension, and morbid obesity, who is being seen today for the evaluation of CHF and elevated troponin at the request of Dr. Blanch.  Past Medical History   Subjective  Past Medical History:  Diagnosis Date   Aortic stenosis    a. 06/2024 Echo: ? low-flow/low-gradient AS; b. 06/2024 Ca2+ score: AoV 891 (not severe).   Arthritis    CAD (coronary artery disease) - nonobstructive    a. 08/2023 Cath: LM nl, LAD 40p/m, 20 side branch, LCx 40p/m, RCA large, nl; b. 06/2024 Cardiac CT: Ca2+ = 1565 (96th%'ile). LM 0, LAD 829, LCX 276, RCA 459.   Chronic HFrEF (heart failure with reduced ejection fraction) (HCC)    a. 01/2021 Echo: EF 50-55%; b. 08/2023 Echo: EF 30-35%; c. 08/2023 Cath: mild nonobs CAD. CO/CI 4.29/2.09; d. 06/2024 Echo: EF 30-35%, septal/inf HK.   CKD (chronic kidney disease), stage III (HCC)    Colon polyps    Goiter    Hyperglycemia    Hyperlipidemia    Hypertension    Hypothyroidism    Mitral regurgitation    Morbid obesity (HCC)    NICM (nonischemic cardiomyopathy) (HCC)    a. 01/2021 Echo: EF 50-55%; b. 08/2023 Echo: EF 30-35%; c. 08/2023 Cath: mild nonobs CAD; d. 06/2024 Echo: EF 30-35%, septal/inf HK, low-nl RV fxn, RVSP 47.74mmHg, sev LAD, mild RAE, mild-mod MR/TR, mild AI, ? low-flow/low-grad  AS, mod PR.   Osteopenia    Pericardial effusion    a. 01/2021 s/p pericardiocentesis-500 mL.   Permanent atrial fibrillation (HCC)    a. CHA2DS2VASc = 6-->eliquis .   Pleural effusion, left    a. 01/2021 s/p L thoracentesis-850 mL.   Pulmonary hypertension (HCC)    a. 08/2023 RHC: PA 69/34 (47), PCWP 33-35.   Tricuspid regurgitation     Past Surgical History:  Procedure Laterality Date   CARDIOVASCULAR STRESS TEST     cataract surgery     COLONOSCOPY     IR THORACENTESIS ASP PLEURAL SPACE W/IMG GUIDE  01/26/2021   LUMBAR DISC SURGERY  2012   PERICARDIOCENTESIS N/A 01/24/2021   Procedure: PERICARDIOCENTESIS;  Surgeon: Ladona Heinz, MD;  Location: [facility] INVASIVE CV LAB;  Service: Cardiovascular;  Laterality: N/A;   POLYPECTOMY     RIGHT/LEFT HEART CATH AND CORONARY ANGIOGRAPHY N/A 09/01/2023   Procedure: RIGHT/LEFT HEART CATH AND CORONARY ANGIOGRAPHY;  Surgeon: Anner Alm ORN, MD;  Location: [facility] INVASIVE CV LAB;  Service: Cardiovascular;  Laterality: N/A;   TOTAL KNEE ARTHROPLASTY Left 05/29/2017   Procedure: LEFT TOTAL KNEE ARTHROPLASTY;  Surgeon: Jerri Kay HERO, MD;  Location: [facility] OR;  Service: Orthopedics;  Laterality: Left;   TUBAL LIGATION  1977   UPPER GASTROINTESTINAL ENDOSCOPY  12/29/2019   US  ECHOCARDIOGRAPHY       Allergies  Allergies  Allergen Reactions   Spironolactone  Other (See Comments)    Hyponatremia-  the level of sodium in blood is too high  Atorvastatin Other (See Comments)    Aches        History of Present Illness   Subjective   78 y.o. female with a history of nonischemic cardiomyopathy, chronic HFrEF, nonobstructive CAD, permanent atrial fibrillation, aortic stenosis, mitral/tricuspid regurgitation, hypertension, hyperlipidemia, stage III chronic kidney disease, history of pericardial effusion and pleural effusion status post pericardiocentesis/thoracentesis in 2022, pulmonary hypertension, and morbid obesity.  In February 2022, patient was admitted with  heart failure and found to have a large pericardial effusion requiring pericardiocentesis.  Cytology was negative.  She also had a large left pleural effusion, which required thoracentesis.  Both effusions were felt to be transudative in the setting of heart failure.  Echo showed EF of 50-55% and she was medically managed.  In September 2024, patient was admitted with recurrent hyponatremia which was felt to be a combination of excessive free water intake and recent diuretic use (sodium down to 120).  Sodium improved with fluid restriction and intravenous Lasix .  She was readmitted in late September 2024 with recurrent hyponatremia and heart failure.  Echo during admission showed new LV dysfunction with an EF of 30 to 35%.  She underwent right and left heart cardiac catheterization which showed moderate, nonobstructive CAD with PA of 69/34 (47) and wedge of 33 to 35 mmHg.  RV EDP was elevated at 66/9.  Cardiac output was 4.29 with an index of 2.09.  Findings consistent with moderate severe secondary pulmonary hypertension with pulmonary venous hypertension and she was aggressively diuresed.  She was readmitted in November 2024 with heart failure, mild troponin elevation, and hallucinations.  [name] had a repeat echocardiogram in July 2025, showing persistent LV dysfunction with an EF of 30-35% with septal and inferior hypokinesis and also question of low-flow/low gradient aortic stenosis.  She underwent coronary calcium  scoring which showed an aortic valve calcium  score of 891, and AAS was not felt to be significant.  She was last seen in cardiology clinic in September 2025, at which time she was in rate controlled A-fib and volume status was felt to be stable.  In the setting of lower blood pressures in the outpatient setting, Lasix  was reduced to 40 mg daily with 20 mg as needed.    Since her cardiology visit in September 2025, [name] notes that her weight has been stable at home, around 189 to 190  pounds.  She has occasionally had to take an extra 20 mg of Lasix  but has overall been stable.  She has had both eyes operated on for cataracts over the past month, most recently just over a week ago.  Over the past 5 days, [name] family has noted increasing agitation and visual and auditory hallucinations.  This is reminiscent of what had occurred prior to her heart failure admission in November 2024.  She was seen by primary care and diagnosed with a UTI a few days ago, and has been taking antibiotics without any improvement in symptoms.  Over the past 48 hours, she has been very agitated and not sleeping, and so family brought her into the emergency department today.    On presentation to the emergency department, [name] was afebrile and normotensive (113/77), and saturating 100% on room air.  ECG shows atrial fibrillation at 90 bpm with a left bundle branch block, which is unchanged over several years.  Lab work noted for acute on chronic stage III kidney disease with BUN of 50, creatinine of 2.55 (28/1.77 respectively in September 2025), hyponatremia at  132, hypokalemia 3.4, hypochloremia at 89, BNP of 762.4, normal H&H and white count at 13.6/42.6 and 10.3 respectively, lactate of 2.2, high sensitive troponins of 33  35.  Chest x-ray shows interstitial prominence throughout the lungs similar to prior study and favoring scarring.  Patchy peripheral opacities in the left mid to lower lung slightly increased from prior and favoring chronic scarring were also noted though superimposed acute infiltrate could not be excluded.  Head CT without acute evidence of intracranial abnormality or with mild chronic small vessel ischemic changes.  CT chest with cardiomegaly and moderate right and small to moderate left pleural effusions as well as underlying chronic interstitial lung disease with subpleural reticulation and mild bronchiectasis.  Diffuse bilateral ground glass disease with suspicion for edema  superimposed on chronic interstitial lung disease.  Gallstones, aortic atherosclerosis, and mildly enlarged mediastinal lymph nodes (likely reactive) also noted.  Respiratory panel is negative.  Urinalysis with large leukocytes and rare bacteria.  Nitrite negative.  She was treated with 1 g of ceftriaxone  and 40 mg of IV Lasix .  She is currently awake and oriented without complaints, with her daughter at the bedside.  Home Medications   Prior to Admission medications   Medication Sig Start Date End Date Taking? Authorizing Provider  acetaminophen  (TYLENOL ) 325 MG tablet Take 325-650 mg by mouth daily as needed for mild pain or headache.   Yes [provider]  apixaban  (ELIQUIS ) 5 MG TABS tablet Take 1 tablet (5 mg total) by mouth 2 (two) times daily. 09/15/24  Yes Patwardhan, Manish J, MD  Calcium  Carb-Cholecalciferol  (CALTRATE 600+D3 PO) Take 1 tablet by mouth every evening.   Yes [provider]  [employer] IRON 325 (65 Fe) MG tablet Take 325 mg by mouth daily at 6 PM. 01/16/21  Yes [provider]  donepezil (ARICEPT) 5 MG tablet Take 5 mg by mouth at bedtime. 05/18/24  Yes [provider]  empagliflozin  (JARDIANCE ) 10 MG TABS tablet Take 1 tablet (10 mg total) by mouth daily. 10/27/23  Yes Patwardhan, Newman PARAS, MD  furosemide  (LASIX ) 40 MG tablet Take 1 tablet daily and can take an additional half tablet as needed for swelling or fluid overload Patient taking differently: Take 40 mg by mouth 2 (two) times daily. Can take an additional tablet as needed for swelling or fluid overload 08/30/24  Yes Conte, Tessa N, PA-C  isosorbide  mononitrate (IMDUR ) 30 MG 24 hr tablet TAKE 1 TABLET BY MOUTH EVERY DAY 08/25/24  Yes Patwardhan, Manish J, MD  lactulose  (CHRONULAC ) 10 GM/15ML solution Take 15 mLs (10 g total) by mouth every other day. Patient taking differently: Take 10 g by mouth every 7 (seven) days. 10/14/23  Yes Fairy Frames, MD  levothyroxine  (SYNTHROID , LEVOTHROID)  125 MCG tablet Take 125 mcg by mouth daily before breakfast. 06/19/12  Yes [provider]  metoprolol  succinate (TOPROL -XL) 100 MG 24 hr tablet TAKE 1 TABLET BY MOUTH DAILY. TAKE WITH OR IMMEDIATELY FOLLOWING A MEAL. 08/25/24  Yes Weaver, Scott T, PA-C  omeprazole  (PRILOSEC) 20 MG capsule TAKE 1 CAPSULE BY MOUTH EVERY DAY 12/21/21  Yes Perry, John N, MD  Oxymetazoline HCl (SINEX ULTRA FINE MIST 12-HOUR NA) Place 1 spray into both nostrils every 12 (twelve) hours as needed (for congestion).   Yes [provider]  rosuvastatin  (CRESTOR ) 20 MG tablet Take 1 tablet (20 mg total) by mouth daily. 05/11/24  Yes Patwardhan, Manish J, MD  sulfamethoxazole-trimethoprim (BACTRIM) 400-80 MG tablet Take 1 tablet by mouth 2 (  two) times daily. 09/27/24  Yes [provider]  valproic acid (DEPAKENE) 250 MG capsule Take 250 mg by mouth at bedtime. 06/09/24  Yes [provider]  vitamin B-12 (CYANOCOBALAMIN ) 1000 MCG tablet Take 1,000 mcg by mouth daily. 01/16/21  Yes [provider]  BESIVANCE 0.6 % SUSP Place 1 drop into both eyes 4 (four) times daily. 09/09/24   [provider]  Bromfenac Sodium 0.07 % SOLN Apply 1 drop to eye 2 (two) times daily. 09/09/24   [provider]  DUREZOL 0.05 % EMUL Place 1 drop into the right eye 4 (four) times daily. 09/09/24   [provider]  prednisoLONE acetate (PRED FORTE) 1 % ophthalmic suspension Place 1 drop into both eyes 4 (four) times daily. 09/09/24   [provider]  VISINE 0.025-0.3 % ophthalmic solution Place 1 drop into both eyes 2 (two) times daily as needed for eye irritation.    [provider]     Family History    Family History  Problem Relation Age of Onset   Heart disease Mother    CVA Mother    Heart disease Father    Heart disease Brother    Colon cancer Neg Hx    Stomach cancer Neg Hx    Colon polyps Neg Hx    Esophageal cancer Neg Hx    Rectal cancer Neg Hx    She  indicated that her mother is deceased. She indicated that her father is deceased. She indicated that her brother is alive. She indicated that the status of her neg hx is unknown.   Social History    Social History   Socioeconomic History   Marital status: Married    Spouse name: Not on file   Number of children: 2   Years of education: Not on file   Highest education level: Not on file  Occupational History   Occupation: retired  Tobacco Use   Smoking status: Never   Smokeless tobacco: Never  Vaping Use   Vaping status: Never Used  Substance and Sexual Activity   Alcohol  use: No   Drug use: Never   Sexual activity: Not on file  Other Topics Concern   Not on file  Social History Narrative   Caffiene diet drink   Lives with husbands, no pets   Retired.    Social Drivers of Corporate Investment Banker Strain: Not on file  Food Insecurity: No Food Insecurity (10/08/2023)   Hunger Vital Sign    Worried About Running Out of Food in the Last Year: Never true    Ran Out of Food in the Last Year: Never true  Transportation Needs: No Transportation Needs (10/08/2023)   PRAPARE - Administrator, Civil Service (Medical): No    Lack of Transportation (Non-Medical): No  Physical Activity: Not on file  Stress: Not on file  Social Connections: Not on file  Intimate Partner Violence: Not At Risk (10/08/2023)   Humiliation, Afraid, Rape, and Kick questionnaire    Fear of Current or Ex-Partner: No    Emotionally Abused: No    Physically Abused: No    Sexually Abused: No     Review of Systems    General:  No chills, fever, night sweats or weight changes.  Cardiovascular:  No chest pain, +++ dyspnea on exertion, +++ bilat LE edema - worse per dtr, +++ orthopnea, no palpitations, paroxysmal nocturnal dyspnea. Dermatological: No rash, lesions/masses Respiratory: No cough, +++ dyspnea Urologic: No  hematuria, dysuria Abdominal:   No nausea, vomiting, diarrhea, bright red  blood per rectum, melena, or hematemesis Neurologic:  +++ Visual and auditory hallucinations.  Malaise. All other systems reviewed and are otherwise negative except as noted above.     Objective   Physical Exam    Blood pressure 129/82, pulse 93, temperature 97.8 F (36.6 C), temperature source Oral, resp. rate 16, SpO2 100%.  General: Pleasant, NAD Psych: Normal affect. Neuro: Alert and oriented X 3. Moves all extremities spontaneously. HEENT: Normal  Neck: Supple without bruits.  JVD to earlobe. + HJR. Lungs:  Resp regular and unlabored, diminished breath sounds at bilateral bases with crackles halfway up. Heart: Irregularly irregular, distant, no s3, s4, or murmurs. Abdomen: Obese, soft, non-tender, non-distended, BS + x 4.  Extremities: No clubbing, cyanosis.  2+ bilateral lower extremity woody edema extending up to her posterior thighs. DP/PT1+, Radials 2+ and equal bilaterally.  Labs    Cardiac Enzymes Recent Labs  Lab 09/29/24 1313 09/29/24 1614  TROPONINIHS 33* 35*     BNP    Component Value Date/Time   BNP 762.4 (H) 09/29/2024 1303    Lab Results  Component Value Date   WBC 10.3 09/29/2024   HGB 15.0 09/29/2024   HCT 44.0 09/29/2024   MCV 82.1 09/29/2024   PLT 258 09/29/2024    Recent Labs  Lab 09/29/24 1303 09/29/24 1647  NA 132* 130*  K 3.4* 4.3  CL 89*  --   CO2 24  --   BUN 50*  --   CREATININE 2.55*  --   CALCIUM  8.7*  --   PROT 5.4*  --   BILITOT 0.7  --   ALKPHOS 41  --   ALT 22  --   AST 24  --   GLUCOSE 83  --    Lab Results  Component Value Date   CHOL 107 08/20/2024   HDL 42 08/20/2024   LDLCALC 49 08/20/2024   TRIG 79 08/20/2024      Radiology Studies    CT HEAD WO CONTRAST ( ) Result Date: 09/29/2024 CLINICAL DATA:  Shortness of breath altered EXAM: CT HEAD WITHOUT CONTRAST TECHNIQUE: Contiguous axial images were obtained from the base of the skull through the vertex without intravenous contrast. RADIATION DOSE  REDUCTION: This exam was performed according to the departmental dose-optimization program which includes automated exposure control, adjustment of the mA and/or kV according to patient size and/or use of iterative reconstruction technique. COMPARISON:  MRI 10/08/2023, CT brain 10/08/2023 FINDINGS: Brain: No acute territorial infarction, hemorrhage or intracranial mass. Mild chronic small vessel ischemic changes of the white matter. Stable ventricle size Vascular: No hyperdense vessels.  Carotid vascular calcification Skull: Normal. Negative for fracture or focal lesion. Sinuses/Orbits: No acute finding. Other: None IMPRESSION: 1. No CT evidence for acute intracranial abnormality. 2. Mild chronic small vessel ischemic changes of the white matter. Electronically Signed   By: Luke Bun M.D.   On: 09/29/2024 16:03   CT Chest Wo Contrast Result Date: 09/29/2024 CLINICAL DATA:  Shortness of breath EXAM: CT CHEST WITHOUT CONTRAST TECHNIQUE: Multidetector CT imaging of the chest was performed following the standard protocol without IV contrast. RADIATION DOSE REDUCTION: This exam was performed according to the departmental dose-optimization program which includes automated exposure control, adjustment of the mA and/or kV according to patient size and/or use of iterative reconstruction technique. COMPARISON:  Chest x-ray 09/29/2024, 10/08/2023 FINDINGS: Cardiovascular: Limited evaluation without intravenous contrast. Moderate severe aortic atherosclerosis. Multi vessel coronary  vascular calcification. Cardiomegaly. Aortic valvular calcification. No significant pericardial effusion Mediastinum/Nodes: Patent trachea. No thyroid  mass. Few mildly enlarged lymph nodes, AP window nodes up to 12 mm. Right paratracheal nodes measuring up to 11 mm. Subcarinal node measuring up to 18 mm. Esophagus within normal limits. Lungs/Pleura: Moderate right and small moderate left pleural effusions. Underlying chronic interstitial lung  disease with subpleural reticulation and mild bronchiectasis. Smooth interstitial densities within the bilateral lungs with diffuse bilateral ground-glass disease, suspect that there is superimposed edema. Upper Abdomen: Gallstones.  No acute finding Musculoskeletal: Degenerative changes. No acute osseous abnormality. IMPRESSION: 1. Cardiomegaly with moderate right and small to moderate left pleural effusions. 2. Underlying chronic interstitial lung disease with subpleural reticulation and mild bronchiectasis. Smooth interstitial densities within the bilateral lungs with diffuse bilateral ground-glass disease, suspect that there is edema superimposed on chronic interstitial lung disease. 3. Mildly enlarged mediastinal lymph nodes, likely reactive. 4. Gallstones. 5. Aortic atherosclerosis. Aortic Atherosclerosis (ICD10-I70.0). Electronically Signed   By: Luke Bun M.D.   On: 09/29/2024 16:00   DG Chest 1 View Result Date: 09/29/2024 EXAM: 1 VIEW(S) XRAY OF THE CHEST 09/29/2024 01:22:00 PM COMPARISON: 10/08/2023 CLINICAL HISTORY: Cough, shortness of breath. FINDINGS: LUNGS AND PLEURA: Interstitial prominence again noted throughout the lungs similar to prior study, favor scarring. Patchy airspace opacities peripherally in the left mid lung and lung base slightly increased since prior study, favor chronic scarring although acute infiltrate cannot be excluded. No pulmonary edema. No pleural effusion. No pneumothorax. HEART AND MEDIASTINUM: No acute abnormality of the cardiac and mediastinal silhouettes. Aortic atherosclerosis BONES AND SOFT TISSUES: No acute osseous abnormality. IMPRESSION: 1. Interstitial prominence throughout the lungs, similar to prior study, favoring scarring. 2. Patchy peripheral opacities in the left mid to lower lung, slightly increased from prior, favored chronic scarring; superimposed acute infiltrate cannot be excluded. Electronically signed by: Franky Crease MD 09/29/2024 02:29 PM EDT RP  Workstation: [id number]      ECG & Cardiac Imaging    atrial fibrillation at 90 bpm with a left bundle branch block - personally reviewed.  Assessment & Plan    1.  Acute on chronic HFrEF/nonischemic cardiomyopathy/pulmonary hypertension: History of EF of 30-35% with nonobstructive CAD on catheterization in September 2024.  She has had several readmissions related to heart failure and adjustment to outpatient diuretics with recent downward titration to 40 mg daily with 20 mg as needed 1 month ago in the setting of soft blood pressures and lightheadedness.  Patient's weight has been stable at home in the 189-190 range.  She has chronic dyspnea on exertion.  Over the past several days, following cataract surgery last week, patient has been experiencing more agitation and visual/auditory hallucinations, which prompted family to bring her to the emergency department.  Here, she exhibits AKI with a creatinine of 2.55, BUN of 50 and hyponatremia with a sodium of 130.  CT of the chest with cardiomegaly and moderate right and small to moderate left pleural effusion as well as underlying chronic interstitial lung disease and suspicion for pulmonary edema.  Respiratory panel negative.  On examination, patient has JVD with bilateral lower extremity woody edema to the posterior thighs.  Diminished breath sounds at the bases with crackles above.  Patient and family say legs look worse than usual.  Presentation concerning for cardiorenal syndrome in the setting of acute on chronic heart failure and possibly low output.  We will re-dose Lasix  at 40 mg and order 40 mg IV twice daily with accompanied potassium supplementation.  In the setting of AKI, her SGLT2 inhibitor is on hold and given propensity for UTIs, maybe this needs to be discontinued altogether.  Continue beta-blocker and nitrate therapy for the time being though if creatinine worsens with attempts at diuresis, will likely need to hold beta-blocker and consider  inotropic therapy.  Historically poor candidate for hydralazine  in the setting of soft pressures.  No ACE/ARB/ARNI/MRA in the setting of CKD 3.  Will benefit from inpatient heart failure evaluation tomorrow.  2.  Nonobstructive CAD/demand ischemia/elevated troponin: Diagnostic catheterization September 2024 with moderate, nonobstructive LAD and circumflex disease for which she has been medically managed.  In light of #1, troponin minimally elevated to a peak of 35 at this time.  ECG without acute ST or T changes in the setting of known prior history of left lateral branch block.  This does not represent ACS.  Cont ? blocker.  No ASA in the setting of chronic eliquis .  3.  Permanent atrial fibrillation:  Rate controlled.  Cont ? blocker and eliquis .  4.  Primary hypertension:  stable, cont nitrate/beta-blocker.  5.  Hyperlipidemia: LDL of 49 in September 2025.  Continue rosuvastatin  therapy.  6.  Acute on chronic stage III kidney disease: BUN of 50 with creatinine of 2.55 in the setting of above.  Question low output.  Follow with diuresis.  Hold SGLT2 inhibitor for the time being.  7.  Valvular heart disease: Concern about low-flow/low gradient aortic stenosis on echo in July 2025 though aortic valve calcium  score only 891.  8.  Prolonged QT/QTc: QTc wider in the setting of elevated heart rate and known left bundle branch block.  Follow.  9.  Hyponatremia: Sodium 130 in the setting of significant volume overload.  Follow with IV diuresis.  Risk Assessment/Risk Scores:        New [place]  Heart Association (NYHA) Functional Class NYHA Class III  CHA2DS2-VASc Score = 6   This indicates a 9.7% annual risk of stroke. The patient's score is based upon: CHF History: 1 HTN History: 1 Diabetes History: 0 Stroke History: 0 Vascular Disease History: 1 Age Score: 2 Gender Score: 1     Signed, Lonni Meager, NP 09/29/2024, 7:46 PM  For questions or updates, please contact   Please  consult [email or web address] for contact info under Cardiology/STEMI.

## 2024-09-29 NOTE — Hospital Course (Signed)
 [name]

## 2024-09-29 NOTE — H&P (Signed)
 History and Physical    Patient: [name] FMW:[id number] DOB: 01-20-1946 DOA: 09/29/2024 DOS: the patient was seen and examined on 09/29/2024 . PCP: Onita Rush, MD  Patient coming from: Home Chief complaint: Chief Complaint  Patient presents with   Shortness of Breath   Fall   Leg Swelling   HPI:  [name] is a 78 y.o. female with past medical history  of  systolic CHF, severe pulmonary hypertension with mPAP 47 mmHg, hyponatremia, pericardial effusion status post pericardiocentesis (01/2021) ,A. Fib on Eliquis , CKD 3b, goiter,hypothyroidism, HLD, HTN,  obesity  comes today for sob and le edema,with recent fall on Sunday and no loc and did not strike her head, and pt was started on abx on Monday for UTI. Pt also noted to be disorientated since her cataract sx  last week.   ED Course:  Vital signs in the ED were notable for the following:  Vitals:   09/29/24 1532 09/29/24 1649 09/29/24 1700 09/29/24 1704  BP: 137/89  128/80   Pulse: 69  68 76  Temp:  97.8 F (36.6 C)    Resp: 20  17 20   SpO2: 97%  92% 94%  TempSrc:  Oral     >>ED evaluation thus far shows: EKG shows A-fib at 90 left bundle branch block prolonged QT at 577 similar to previous EKG in March.  Initial venous blood gas showing pH of 7.43 pO2 of less than 31 bicarb 38.5. CMP showing potassium 3.4 sodium 132 creatinine 2.55 BUN of 55 anion gap of 19. Normal LFTs. CBC shows normal white count hemoglobin and platelets.   >>While in the ED patient received the following: Medications  furosemide  (LASIX ) injection 40 mg (has no administration in time range)  cefTRIAXone  (ROCEPHIN ) 1 g in sodium chloride  0.9 % 100 mL IVPB (has no administration in time range)   Review of Systems  Unable to perform ROS: Age  Neurological:        Disorientation.    Past Medical History:  Diagnosis Date   Arthritis    CHF (congestive heart failure) (HCC)    Chronic HFrEF (heart failure with reduced ejection fraction)  (HCC)    CKD (chronic kidney disease), stage III (HCC)    Colon polyps    Goiter    Hyperglycemia    Hyperlipidemia    Hypertension    Hypothyroidism    Morbid obesity (HCC)    Osteopenia    Permanent atrial fibrillation (HCC)    Past Surgical History:  Procedure Laterality Date   CARDIOVASCULAR STRESS TEST     cataract surgery     COLONOSCOPY     IR THORACENTESIS ASP PLEURAL SPACE W/IMG GUIDE  01/26/2021   LUMBAR DISC SURGERY  2012   PERICARDIOCENTESIS N/A 01/24/2021   Procedure: PERICARDIOCENTESIS;  Surgeon: Ladona Heinz, MD;  Location: [facility] INVASIVE CV LAB;  Service: Cardiovascular;  Laterality: N/A;   POLYPECTOMY     RIGHT/LEFT HEART CATH AND CORONARY ANGIOGRAPHY N/A 09/01/2023   Procedure: RIGHT/LEFT HEART CATH AND CORONARY ANGIOGRAPHY;  Surgeon: Anner Alm ORN, MD;  Location: [facility] INVASIVE CV LAB;  Service: Cardiovascular;  Laterality: N/A;   TOTAL KNEE ARTHROPLASTY Left 05/29/2017   Procedure: LEFT TOTAL KNEE ARTHROPLASTY;  Surgeon: Jerri Kay HERO, MD;  Location: [facility] OR;  Service: Orthopedics;  Laterality: Left;   TUBAL LIGATION  1977   UPPER GASTROINTESTINAL ENDOSCOPY  12/29/2019   US  ECHOCARDIOGRAPHY      reports that she has never smoked. She has never  used smokeless tobacco. She reports that she does not drink alcohol  and does not use drugs. Allergies  Allergen Reactions   Spironolactone  Other (See Comments)    Hyponatremia-  the level of sodium in blood is too high   Atorvastatin Other (See Comments)    Aches    Family History  Problem Relation Age of Onset   Heart disease Mother    CVA Mother    Heart disease Father    Heart disease Brother    Colon cancer Neg Hx    Stomach cancer Neg Hx    Colon polyps Neg Hx    Esophageal cancer Neg Hx    Rectal cancer Neg Hx    Prior to Admission medications   Medication Sig Start Date End Date Taking? Authorizing Provider  acetaminophen  (TYLENOL ) 325 MG tablet Take 325-650 mg by mouth daily as needed for mild pain or  headache.    [provider]  apixaban  (ELIQUIS ) 5 MG TABS tablet Take 1 tablet (5 mg total) by mouth 2 (two) times daily. 09/15/24   Patwardhan, Newman PARAS, MD  Calcium  Carb-Cholecalciferol  (CALTRATE 600+D3 PO) Take 1 tablet by mouth every evening.    [provider]  [employer] IRON 325 (65 Fe) MG tablet Take 325 mg by mouth daily at 6 PM. 01/16/21   [provider]  empagliflozin  (JARDIANCE ) 10 MG TABS tablet Take 1 tablet (10 mg total) by mouth daily. 10/27/23   Patwardhan, Newman PARAS, MD  furosemide  (LASIX ) 40 MG tablet Take 1 tablet daily and can take an additional half tablet as needed for swelling or fluid overload 08/30/24   Lucien Orren SAILOR, PA-C  isosorbide  mononitrate (IMDUR ) 30 MG 24 hr tablet TAKE 1 TABLET BY MOUTH EVERY DAY 08/25/24   Patwardhan, Manish J, MD  lactulose  (CHRONULAC ) 10 GM/15ML solution Take 15 mLs (10 g total) by mouth every other day. 10/14/23   Fairy Frames, MD  levothyroxine  (SYNTHROID , LEVOTHROID) 125 MCG tablet Take 125 mcg by mouth daily before breakfast. 06/19/12   [provider]  metoprolol  succinate (TOPROL -XL) 100 MG 24 hr tablet TAKE 1 TABLET BY MOUTH DAILY. TAKE WITH OR IMMEDIATELY FOLLOWING A MEAL. 08/25/24   Lelon Hamilton T, PA-C  omeprazole  (PRILOSEC) 20 MG capsule TAKE 1 CAPSULE BY MOUTH EVERY DAY Patient taking differently: Take 20 mg by mouth daily before breakfast. 12/21/21   Abran Norleen SAILOR, MD  Oxymetazoline HCl (SINEX ULTRA FINE MIST 12-HOUR NA) Place 1 spray into both nostrils every 12 (twelve) hours as needed (for congestion).    [provider]  rosuvastatin  (CRESTOR ) 20 MG tablet Take 1 tablet (20 mg total) by mouth daily. 05/11/24   Patwardhan, Newman PARAS, MD  VISINE 0.025-0.3 % ophthalmic solution Place 1 drop into both eyes 2 (two) times daily as needed for eye irritation.    [provider]  vitamin B-12 (CYANOCOBALAMIN ) 1000 MCG tablet Take 1,000 mcg by mouth daily. 01/16/21   [provider]                                                                                  Vitals:   09/29/24 8467 09/29/24 1649 09/29/24 1700 09/29/24 1704  BP: 137/89  128/80   Pulse: 69  68 76  Resp: 20  17 20   Temp:  97.8 F (36.6 C)    TempSrc:  Oral    SpO2: 97%  92% 94%   Physical Exam Vitals reviewed.  Constitutional:      General: She is not in acute distress.    Appearance: She is ill-appearing.  HENT:     Head: Normocephalic and atraumatic.     Mouth/Throat:     Mouth: Mucous membranes are dry.  Eyes:     Extraocular Movements: Extraocular movements intact.  Cardiovascular:     Rate and Rhythm: Normal rate. Rhythm irregular.     Heart sounds: Normal heart sounds.  Pulmonary:     Effort: Pulmonary effort is normal.     Breath sounds: Examination of the right-middle field reveals rales. Examination of the left-middle field reveals rales. Examination of the right-lower field reveals rales. Examination of the left-lower field reveals rales. Rales present.  Abdominal:     General: There is no distension.     Palpations: Abdomen is soft.     Tenderness: There is no abdominal tenderness.  Musculoskeletal:     Right lower leg: No edema.     Left lower leg: No edema.  Neurological:     General: No focal deficit present.     Mental Status: She is lethargic.     Cranial Nerves: No dysarthria or facial asymmetry.  Psychiatric:        Attention and Perception: She is inattentive.        Speech: She is noncommunicative.        Behavior: Behavior is cooperative.     Labs on Admission: I have personally reviewed following labs and imaging studies CBC: Recent Labs  Lab 09/29/24 1303 09/29/24 1647  WBC 10.3  --   NEUTROABS 8.4*  --   HGB 13.6 15.0  HCT 42.6 44.0  MCV 82.1  --   PLT 258  --    Basic Metabolic Panel: Recent Labs  Lab 09/29/24 1303 09/29/24 1647  NA 132* 130*  K 3.4* 4.3  CL 89*  --   CO2 24  --   GLUCOSE 83  --   BUN 50*  --   CREATININE 2.55*  --    CALCIUM  8.7*  --    GFR: CrCl cannot be calculated (Unknown ideal weight.). Liver Function Tests: Recent Labs  Lab 09/29/24 1303  AST 24  ALT 22  ALKPHOS 41  BILITOT 0.7  PROT 5.4*  ALBUMIN 3.7   No results for input(s): LIPASE, AMYLASE in the last 168 hours. Recent Labs  Lab 09/29/24 1304  AMMONIA 19   Recent Labs    10/09/23 0555 10/10/23 0338 10/11/23 0227 10/12/23 0311 10/13/23 2150 10/14/23 0407 11/11/23 1601 12/04/23 1536 08/20/24 1347 09/29/24 1303  BUN 23 22 21  24* 36* 32* 17 15 28* 50*  CREATININE 1.55* 1.48* 1.57* 1.59* 1.73* 1.70* 1.55* 1.50* 1.77* 2.55*    Cardiac Enzymes: No results for input(s): CKTOTAL, CKMB, CKMBINDEX, TROPONINI in the last 168 hours. BNP (last 3 results) Recent Labs    08/20/24 1347  PROBNP 12,768*   HbA1C: No results for input(s): HGBA1C in the last 72 hours. CBG: No results for input(s): GLUCAP in the last 168 hours. Lipid Profile: No results for input(s): CHOL, HDL, LDLCALC, TRIG, CHOLHDL, LDLDIRECT in the last 72 hours. Thyroid  Function Tests: No results for input(s): TSH, T4TOTAL, FREET4, T3FREE, THYROIDAB in the last 72  hours. Anemia Panel: No results for input(s): VITAMINB12, FOLATE, FERRITIN, TIBC, IRON, RETICCTPCT in the last 72 hours. Urine analysis:    Component Value Date/Time   COLORURINE YELLOW 09/29/2024 1324   APPEARANCEUR HAZY (A) 09/29/2024 1324   LABSPEC 1.012 09/29/2024 1324   PHURINE 5.0 09/29/2024 1324   GLUCOSEU 150 (A) 09/29/2024 1324   HGBUR SMALL (A) 09/29/2024 1324   BILIRUBINUR NEGATIVE 09/29/2024 1324   KETONESUR NEGATIVE 09/29/2024 1324   PROTEINUR NEGATIVE 09/29/2024 1324   UROBILINOGEN 0.2 01/31/2011 1208   NITRITE NEGATIVE 09/29/2024 1324   LEUKOCYTESUR LARGE (A) 09/29/2024 1324   Radiological Exams on Admission: CT HEAD WO CONTRAST ( ) Result Date: 09/29/2024 CLINICAL DATA:  Shortness of breath altered EXAM: CT HEAD WITHOUT  CONTRAST TECHNIQUE: Contiguous axial images were obtained from the base of the skull through the vertex without intravenous contrast. RADIATION DOSE REDUCTION: This exam was performed according to the departmental dose-optimization program which includes automated exposure control, adjustment of the mA and/or kV according to patient size and/or use of iterative reconstruction technique. COMPARISON:  MRI 10/08/2023, CT brain 10/08/2023 FINDINGS: Brain: No acute territorial infarction, hemorrhage or intracranial mass. Mild chronic small vessel ischemic changes of the white matter. Stable ventricle size Vascular: No hyperdense vessels.  Carotid vascular calcification Skull: Normal. Negative for fracture or focal lesion. Sinuses/Orbits: No acute finding. Other: None IMPRESSION: 1. No CT evidence for acute intracranial abnormality. 2. Mild chronic small vessel ischemic changes of the white matter. Electronically Signed   By: Luke Bun M.D.   On: 09/29/2024 16:03   CT Chest Wo Contrast Result Date: 09/29/2024 CLINICAL DATA:  Shortness of breath EXAM: CT CHEST WITHOUT CONTRAST TECHNIQUE: Multidetector CT imaging of the chest was performed following the standard protocol without IV contrast. RADIATION DOSE REDUCTION: This exam was performed according to the departmental dose-optimization program which includes automated exposure control, adjustment of the mA and/or kV according to patient size and/or use of iterative reconstruction technique. COMPARISON:  Chest x-ray 09/29/2024, 10/08/2023 FINDINGS: Cardiovascular: Limited evaluation without intravenous contrast. Moderate severe aortic atherosclerosis. Multi vessel coronary vascular calcification. Cardiomegaly. Aortic valvular calcification. No significant pericardial effusion Mediastinum/Nodes: Patent trachea. No thyroid  mass. Few mildly enlarged lymph nodes, AP window nodes up to 12 mm. Right paratracheal nodes measuring up to 11 mm. Subcarinal node measuring up  to 18 mm. Esophagus within normal limits. Lungs/Pleura: Moderate right and small moderate left pleural effusions. Underlying chronic interstitial lung disease with subpleural reticulation and mild bronchiectasis. Smooth interstitial densities within the bilateral lungs with diffuse bilateral ground-glass disease, suspect that there is superimposed edema. Upper Abdomen: Gallstones.  No acute finding Musculoskeletal: Degenerative changes. No acute osseous abnormality. IMPRESSION: 1. Cardiomegaly with moderate right and small to moderate left pleural effusions. 2. Underlying chronic interstitial lung disease with subpleural reticulation and mild bronchiectasis. Smooth interstitial densities within the bilateral lungs with diffuse bilateral ground-glass disease, suspect that there is edema superimposed on chronic interstitial lung disease. 3. Mildly enlarged mediastinal lymph nodes, likely reactive. 4. Gallstones. 5. Aortic atherosclerosis. Aortic Atherosclerosis (ICD10-I70.0). Electronically Signed   By: Luke Bun M.D.   On: 09/29/2024 16:00   DG Chest 1 View Result Date: 09/29/2024 EXAM: 1 VIEW(S) XRAY OF THE CHEST 09/29/2024 01:22:00 PM COMPARISON: 10/08/2023 CLINICAL HISTORY: Cough, shortness of breath. FINDINGS: LUNGS AND PLEURA: Interstitial prominence again noted throughout the lungs similar to prior study, favor scarring. Patchy airspace opacities peripherally in the left mid lung and lung base slightly increased since prior study, favor chronic scarring although  acute infiltrate cannot be excluded. No pulmonary edema. No pleural effusion. No pneumothorax. HEART AND MEDIASTINUM: No acute abnormality of the cardiac and mediastinal silhouettes. Aortic atherosclerosis BONES AND SOFT TISSUES: No acute osseous abnormality. IMPRESSION: 1. Interstitial prominence throughout the lungs, similar to prior study, favoring scarring. 2. Patchy peripheral opacities in the left mid to lower lung, slightly increased from  prior, favored chronic scarring; superimposed acute infiltrate cannot be excluded. Electronically signed by: Franky Crease MD 09/29/2024 02:29 PM EDT RP Workstation: [id number]   Data Reviewed: Relevant notes from primary care and specialist visits, past discharge summaries as available in EHR, including Care Everywhere . Prior diagnostic testing as pertinent to current admission diagnoses, Updated medications and problem lists for reconciliation .ED course, including vitals, labs, imaging, treatment and response to treatment,Triage notes, nursing and pharmacy notes and ED provider's notes.Notable results as noted in HPI.Discussed case with EDMD/ ED APP/ or Specialty MD on call and as needed.  Assessment & Plan   >>Chills: 2/2 sepsis /UTI.  Supportive care PRN.  Rectal temps to rule out fever and PRN tylenol .  Gentle IVF hydration. Cont abx and will follow blood and urine cultures.   >>Somnolence: Suspect metabolic/ due to AKI on CKD or sepsis.  EEG/ MRI. Blood gas stable. Repeat lactic is pending.  Neuro checks.  Will cont with gentle ivf hydration and monitor.   >>Suspect Sepsis with organ dysfunction / UTI: Follow C/S blood and urine. Cont rocephin  until other infection is ruled out.    >>PAF/prolonged QTc: Currently in A.fib QTC prolonged at 577. Continue Eliquis  Avoid antiemetics and other meds that can prolong QTC.  Magnesium  levels ordered and pending.   >> Chronic HFrEF with a EF of 30 to 35%: Currently blood pressure is low and will hold any diuretic, blood strict I/O. Weights.  Repeat echo and will request cardiology consult.  Vitals:   09/29/24 1231 09/29/24 1330 09/29/24 1532 09/29/24 1700  BP: 113/77 125/87 137/89 128/80  Will hold Imdur , Lasix , Jardiance . CT chest shows bilateral pleural effusion,   >> AKI on CKD stage IIIb: Lab Results  Component Value Date   CREATININE 2.55 (H) 09/29/2024   CREATININE 1.77 (H) 08/20/2024   CREATININE 1.50 (H) 12/04/2023   Avoid contrast studies.  Renally dose needed medications. Will hold Imdur , Lasix , Jardiance .  >> Hypothyroidism: Continue patient on her levothyroxine  at 125 mcg.   DVT prophylaxis:  Continue Eliquis .  Consults:  None  Advance Care Planning:    Code Status: Full Code   Family Communication:  Daughter Disposition Plan:  Home Severity of Illness: The appropriate patient status for this patient is INPATIENT. Inpatient status is judged to be reasonable and necessary in order to provide the required intensity of service to ensure the patient's safety. The patient's presenting symptoms, physical exam findings, and initial radiographic and laboratory data in the context of their chronic comorbidities is felt to place them at high risk for further clinical deterioration. Furthermore, it is not anticipated that the patient will be medically stable for discharge from the hospital within 2 midnights of admission.   * I certify that at the point of admission it is my clinical judgment that the patient will require inpatient hospital care spanning beyond 2 midnights from the point of admission due to high intensity of service, high risk for further deterioration and high frequency of surveillance required.*  Unresulted Labs (From admission, onward)     Start     Ordered   09/30/24 0500  Comprehensive  metabolic panel  Tomorrow morning,   R        09/29/24 1746   09/30/24 0500  CBC  Tomorrow morning,   R        09/29/24 1746   09/30/24 0500  Phosphorus  Tomorrow morning,   R        09/29/24 1807   09/29/24 1828  Blood culture (routine x 2)  BLOOD CULTURE X 2,   R     Question:  Patient immune status  Answer:  Normal   09/29/24 1827   09/29/24 1828  Urine Culture  Add-on,   AD       Question Answer Comment  Indication Altered mental status (if no other cause identified)   Patient immune status Normal      09/29/24 1827   09/29/24 1803  Hemoglobin A1c  Add-on,   AD        09/29/24 1803    09/29/24 1710  Digoxin  level  Add-on,   AD        09/29/24 1709   09/29/24 1708  Magnesium   Add-on,   AD        09/29/24 1709            Meds ordered this encounter  Medications   DISCONTD: lactated ringers  bolus 500 mL   furosemide  (LASIX ) injection 40 mg   cefTRIAXone  (ROCEPHIN ) 1 g in sodium chloride  0.9 % 100 mL IVPB    Antibiotic Indication::   UTI   cefTRIAXone  (ROCEPHIN ) 2 g in sodium chloride  0.9 % 100 mL IVPB    Antibiotic Indication::   UTI   0.9 %  sodium chloride  infusion   sodium chloride  flush (NS) 0.9 % injection 3 mL   OR Linked Order Group    acetaminophen  (TYLENOL ) tablet 650 mg    acetaminophen  (TYLENOL ) suppository 650 mg     Orders Placed This Encounter  Procedures   Respiratory (~20 pathogens) panel by PCR   Blood culture (routine x 2)   Urine Culture   DG Chest 1 View   CT Chest Wo Contrast   CT HEAD WO CONTRAST ( )   MR BRAIN WO CONTRAST   CBC with Differential   Ammonia   Comprehensive metabolic panel   Urinalysis, w/ Reflex to Culture (Infection Suspected) -Urine, Clean Catch   Brain natriuretic peptide   Magnesium    Digoxin  level   Comprehensive metabolic panel   CBC   Hemoglobin A1c   Phosphorus   Diet NPO time specified Except for: Sips with Meds   ED Cardiac monitoring   Neuro checks   Swallow screen   Strict intake and output   Maintain IV access   Vital signs   Notify physician (specify)   Refer to Sidebar Report Mobility Protocol for Adult Inpatient   Initiate Adult Central Line Maintenance and Catheter Protocol for patients with central line (CVC, PICC, Port, Hemodialysis, Trialysis)   Daily weights   Intake and Output   Initiate CHG Protocol   Do not place and if present remove PureWick   Initiate Oral Care Protocol   Initiate Carrier Fluid Protocol   RN may order General Admission PRN Orders utilizing General Admission PRN medications (through manage orders) for the following patient needs: allergy symptoms  (Claritin), cold sores (Carmex), cough (Robitussin DM), eye irritation (Liquifilm Tears), hemorrhoids (Tucks), indigestion (Maalox), minor skin irritation (Hydrocortisone Cream), muscle pain Lucienne Gay), nose irritation (saline nasal spray) and sore throat (Chloraseptic spray).   Cardiac Monitoring - Continuous  Indefinite   Bed rest   Full code   Consult to hospitalist   Droplet precaution   Pulse oximetry check with vital signs   Oxygen therapy Mode or (Route): Nasal cannula; Liters Per Minute: 2; Keep O2 saturation between: greater than 92 %   I-Stat Lactic Acid   I-Stat CG4 Lactic Acid   I-Stat venous blood gas, ([facility] ED, [facility], [facility])   I-Stat CG4 Lactic Acid   EKG 12-Lead   EEG adult   Admit to Inpatient (patient's expected length of stay will be greater than 2 midnights or inpatient only procedure)   Aspiration precautions   Fall precautions    Author: Mario LULLA Blanch, MD 12 pm- 8 pm. [facility]. 09/29/2024 6:41 PM Please note for any communication after hours contact [facility] Assigned provider on call on Amion.

## 2024-09-29 NOTE — ED Notes (Signed)
 Next Lact due @1816 

## 2024-09-29 NOTE — ED Triage Notes (Signed)
 PT arrives via POV. Daughter reports patient has been having increased swelling in her legs, worsening sob, and some hallucinations. Hx of CHF. PT does arrive AxOx4. Daughter reports patient did fall out of her lift chair on Sunday, no injuries from the fall, denies hitting her head, she is on eliquis . Daughter states pcp started her on antibiotics Monday for possible UTI.

## 2024-09-29 NOTE — ED Provider Triage Note (Signed)
 Emergency Medicine Provider Triage Evaluation Note  ANGELL PINCOCK , a 78 y.o. female  was evaluated in triage.  Pt complains of AMS.  Review of Systems  Positive: Hallucinations, cough, vomiting Negative: fever  Physical Exam  BP 113/77 (BP Location: Right Arm)   Pulse 66   Temp 97.9 F (36.6 C) (Oral)   Resp 16   SpO2 100%  Gen:   Awake, no distress   Resp:  Normal effort  MSK:   Moves extremities without difficulty  Other:    Medical Decision Making  Medically screening exam initiated at 12:47 PM.  Appropriate orders placed.  JUANISHA BAUTCH was informed that the remainder of the evaluation will be completed by another provider, this initial triage assessment does not replace that evaluation, and the importance of remaining in the ED until their evaluation is complete.  Here with daughter who provides history Increasing AMS hallucinations progressively worsening over one week. ?UTI Monday when seen by PCP but sxs worsening despite treatment. H/O similar symptoms on admission last year with high ammonia levels.    Odell Balls, PA-C 09/29/24 1249

## 2024-09-30 ENCOUNTER — Inpatient Hospital Stay (HOSPITAL_COMMUNITY)

## 2024-09-30 DIAGNOSIS — R569 Unspecified convulsions: Secondary | ICD-10-CM

## 2024-09-30 DIAGNOSIS — R4182 Altered mental status, unspecified: Secondary | ICD-10-CM

## 2024-09-30 DIAGNOSIS — N390 Urinary tract infection, site not specified: Secondary | ICD-10-CM | POA: Diagnosis not present

## 2024-09-30 DIAGNOSIS — R29702 NIHSS score 2: Secondary | ICD-10-CM

## 2024-09-30 DIAGNOSIS — I5020 Unspecified systolic (congestive) heart failure: Secondary | ICD-10-CM

## 2024-09-30 DIAGNOSIS — I6340 Cerebral infarction due to embolism of unspecified cerebral artery: Secondary | ICD-10-CM

## 2024-09-30 DIAGNOSIS — R400 Somnolence: Secondary | ICD-10-CM | POA: Diagnosis not present

## 2024-09-30 DIAGNOSIS — N1832 Chronic kidney disease, stage 3b: Secondary | ICD-10-CM | POA: Diagnosis not present

## 2024-09-30 DIAGNOSIS — N179 Acute kidney failure, unspecified: Secondary | ICD-10-CM | POA: Diagnosis not present

## 2024-09-30 DIAGNOSIS — I5021 Acute systolic (congestive) heart failure: Secondary | ICD-10-CM | POA: Diagnosis not present

## 2024-09-30 DIAGNOSIS — N189 Chronic kidney disease, unspecified: Secondary | ICD-10-CM | POA: Diagnosis not present

## 2024-09-30 DIAGNOSIS — I130 Hypertensive heart and chronic kidney disease with heart failure and stage 1 through stage 4 chronic kidney disease, or unspecified chronic kidney disease: Secondary | ICD-10-CM

## 2024-09-30 DIAGNOSIS — I639 Cerebral infarction, unspecified: Secondary | ICD-10-CM

## 2024-09-30 DIAGNOSIS — G928 Other toxic encephalopathy: Secondary | ICD-10-CM

## 2024-09-30 LAB — CBC
HCT: 39.4 % (ref 36.0–46.0)
Hemoglobin: 12.5 g/dL (ref 12.0–15.0)
MCH: 26.6 pg (ref 26.0–34.0)
MCHC: 31.7 g/dL (ref 30.0–36.0)
MCV: 83.8 fL (ref 80.0–100.0)
Platelets: 206 K/uL (ref 150–400)
RBC: 4.7 MIL/uL (ref 3.87–5.11)
RDW: 16.8 % — ABNORMAL HIGH (ref 11.5–15.5)
WBC: 8 K/uL (ref 4.0–10.5)
nRBC: 0 % (ref 0.0–0.2)

## 2024-09-30 LAB — COMPREHENSIVE METABOLIC PANEL WITH GFR
ALT: 20 U/L (ref 0–44)
AST: 22 U/L (ref 15–41)
Albumin: 3.2 g/dL — ABNORMAL LOW (ref 3.5–5.0)
Alkaline Phosphatase: 42 U/L (ref 38–126)
Anion gap: 16 — ABNORMAL HIGH (ref 5–15)
BUN: 48 mg/dL — ABNORMAL HIGH (ref 8–23)
CO2: 28 mmol/L (ref 22–32)
Calcium: 8.5 mg/dL — ABNORMAL LOW (ref 8.9–10.3)
Chloride: 94 mmol/L — ABNORMAL LOW (ref 98–111)
Creatinine, Ser: 2.49 mg/dL — ABNORMAL HIGH (ref 0.44–1.00)
GFR, Estimated: 19 mL/min — ABNORMAL LOW (ref >60)
Glucose, Bld: 76 mg/dL (ref 70–99)
Potassium: 3.6 mmol/L (ref 3.5–5.1)
Sodium: 138 mmol/L (ref 135–145)
Total Bilirubin: 0.7 mg/dL (ref 0.0–1.2)
Total Protein: 5.7 g/dL — ABNORMAL LOW (ref 6.5–8.1)

## 2024-09-30 LAB — LACTIC ACID, PLASMA: Lactic Acid, Venous: 2.2 mmol/L (ref 0.5–1.9)

## 2024-09-30 LAB — CBG MONITORING, ED
Glucose-Capillary: 58 mg/dL — ABNORMAL LOW (ref 70–99)
Glucose-Capillary: 73 mg/dL (ref 70–99)

## 2024-09-30 LAB — PHOSPHORUS: Phosphorus: 5.8 mg/dL — ABNORMAL HIGH (ref 2.5–4.6)

## 2024-09-30 LAB — GLUCOSE, CAPILLARY: Glucose-Capillary: 104 mg/dL — ABNORMAL HIGH (ref 70–99)

## 2024-09-30 MED ORDER — DEXTROSE 50 % IV SOLN
INTRAVENOUS | Status: AC
  Filled 2024-09-30: qty 50

## 2024-09-30 MED ORDER — DEXTROSE 50 % IV SOLN
1.0000 | Freq: Once | INTRAVENOUS | Status: AC
  Administered 2024-09-30: 50 mL via INTRAVENOUS

## 2024-09-30 MED ORDER — ROSUVASTATIN CALCIUM 20 MG PO TABS
20.0000 mg | ORAL_TABLET | Freq: Every day | ORAL | Status: DC
  Administered 2024-09-30 – 2024-10-09 (×10): 20 mg via ORAL
  Filled 2024-09-30 (×10): qty 1

## 2024-09-30 MED ORDER — APIXABAN 5 MG PO TABS: 5.0000 mg | ORAL_TABLET | Freq: Two times a day (BID) | ORAL | Status: DC

## 2024-09-30 MED ORDER — APIXABAN 5 MG PO TABS
5.0000 mg | ORAL_TABLET | Freq: Two times a day (BID) | ORAL | Status: DC
  Administered 2024-09-30 – 2024-10-09 (×18): 5 mg via ORAL
  Filled 2024-09-30 (×18): qty 1

## 2024-09-30 NOTE — ED Notes (Signed)
 Patient assisted with using the restroom by RN.

## 2024-09-30 NOTE — Progress Notes (Signed)
 Cardiology Progress Note  Patient ID: [name] MRN: [id number] DOB: 1946/07/10 Date of Encounter: 09/30/2024 Primary Cardiologist: Newman JINNY Lawrence, MD  Subjective   Chief Complaint: SOB  HPI: Admitted with sepsis and UTI.  Cardiology consulted for volume overload.  Kidney function improving.  She is mentating well.  ROS:  All other ROS reviewed and negative. Pertinent positives noted in the HPI.     Telemetry  Overnight telemetry shows A-fib 90s, which I personally reviewed.    Physical Exam   Vitals:   09/30/24 0600 09/30/24 0756 09/30/24 0800 09/30/24 0801  BP: 102/86 110/70 107/71   Pulse: 81 90 91   Resp: 17 17 16    Temp:    (!) 97.4 F (36.3 C)  TempSrc:    Oral  SpO2: 100% 100% 98%     Intake/Output Summary (Last 24 hours) at 09/30/2024 0952 Last data filed at 09/29/2024 1857 Gross per 24 hour  Intake 158.92 ml  Output 220 ml  Net -61.08 ml       08/30/2024    2:49 PM 08/20/2024   12:47 PM 05/27/2024    1:47 PM  Last 3 Weights  Weight (lbs) 182 lb 190 lb 190 lb  Weight (kg) 82.555 kg 86.183 kg 86.183 kg    There is no height or weight on file to calculate BMI.  General: Well nourished, well developed, in no acute distress Head: Atraumatic, normal size  Eyes: PEERLA, EOMI  Neck: Supple, no JVD Endocrine: No thryomegaly Cardiac: Normal S1, S2; irregular rhythm, 2 out of 6 systolic ejection murmur Lungs: Diminished breath sounds bilaterally Abd: Soft, nontender, no hepatomegaly  Ext: 1+ pitting edema Musculoskeletal: No deformities, BUE and BLE strength normal and equal Skin: Warm and dry, no rashes   Neuro: Alert and oriented to person, place, time, and situation, CNII-XII grossly intact, no focal deficits  Psych: Normal mood and affect   Cardiac Studies  TTE 06/08/2024  1. The left ventricle demonstrates global and regional hypokinesis. The  entire septum and entire inferior wall are hypokinetic. SABRA Left ventricular  ejection fraction, by  estimation, is 30 to 35%. The left ventricle has  moderately decreased function. Left   ventricular diastolic function could not be evaluated.   2. Right ventricular systolic function is low normal. The right  ventricular size is normal. There is moderately elevated pulmonary artery  systolic pressure. The estimated right ventricular systolic pressure is  47.6 mmHg.   3. Left atrial size was severely dilated.   4. Right atrial size was mildly dilated.   5. The mitral valve is degenerative. Mild to moderate mitral valve  regurgitation. No evidence of mitral stenosis.   6. Tricuspid valve regurgitation is mild to moderate.   7. The aortic valve is grossly normal. Unable to determine aortic valve  morphology due to image quality. Aortic valve regurgitation is mild. low  flow low gradient aortic stenosis (peak velocity 2.58m/s, MG , AVA  VTI 0.46cm2, SVi 12, DI 0.15  -[id number]).   8. Pulmonic valve regurgitation is moderate.   9. The inferior vena cava is normal in size with greater than 50%  respiratory variability, suggesting right atrial pressure of 3 mmHg.   LHC/RHC 09/01/2023 POST-CATH FINDINGS Angiographically mild to moderate diffuse disease in the LCx and LAD but not flow-limiting. Right dominant system. Moderate to Severe Secondary Pulmonary Hypertension With Pulmonary Venous Hypertension:  PAP-mean 69/34-47 mmHg with PCWP of 33- 35 mmHg and LVp-EDP 162/34-26 mmHg.  RAP mean 15 mmHg,  RV P-EDP 66/9-15 mmHg Ao sat 94%, PA sat 61%.   Mildly reduced Cardiac Output 4.29, cardiac index 2.09   Patient Profile  [name] is a 78 y.o. female with systolic heart failure, nonobstructive CAD, permanent A-fib, CKD 3B, aortic stenosis admitted on 09/29/2024 with confusion and UTI.  Cardiology consulted for acute systolic heart failure.  Assessment & Plan   # Sepsis # UTI # AKI # Lactic acidosis - Warm on exam.  No signs of cardiogenic shock.  Kidney function is actually  improving.  I do not believe she is in cardiogenic shock.  Does not need a PICC line at this time. - Continue antibiotics.  Diuresis as below.  # Acute CVA - Acute ischemic nonhemorrhagic infarct of the left frontal lobe - Holding Eliquis  for now - permissive HTN  # Acute on chronic systolic heart failure, EF 30-35% - Slightly volume up.  Likely triggered by sepsis.  Blood pressures are marginal.  Would give her Lasix  40 mg IV twice daily.  Kidney function is improving.  Does not appear to be in cardiogenic shock. - On limited GDMT given advanced age and advanced kidney disease.  Hold metoprolol  and Imdur  for now. - No ACE/ARB/ARNI/MRA given CKD 3B.  # Permanent A-fib - Holding beta-blocker for now.  She is recovering from sepsis.  Hold Eliquis  in the setting of stroke.  Further recommendations per neuro.  # CAD # Demand Ischemia - Troponin minimally elevated.  Due to heart failure.  Not ACS.  Not on aspirin  as she is on Eliquis .  Okay to continue statin.  # AKI # CKD 3b - AKI likely related to sepsis.  Improving.  Diuresis as above.     For questions or updates, please contact [facility] Please consult [email or web address] for contact info under      Signed, Darryle T. Barbaraann, MD, [facility]  [facility]  09/30/2024 9:52 AM

## 2024-09-30 NOTE — Procedures (Signed)
 Patient Name: SUMEYA YONTZ  MRN: [id number]  Epilepsy Attending: Arlin MALVA Krebs  Referring Physician/Provider: Tobie Mario GAILS, MD  Date: 09/30/2024 Duration: 26.25 mins  Patient history: 78yo F with ams. EEG to evaluate for seizure  Level of alertness: Awake, asleep  AEDs during EEG study: None  Technical aspects: This EEG study was done with scalp electrodes positioned according to the 10-20 International system of electrode placement. Electrical activity was reviewed with band pass filter of 1-70Hz , sensitivity of 7 uV/mm, display speed of 96mm/sec with a 60Hz  notched filter applied as appropriate. EEG data were recorded continuously and digitally stored.  Video monitoring was available and reviewed as appropriate.  Description: The posterior dominant rhythm consists of 8Hz  activity of moderate voltage (25-35 uV) seen predominantly in posterior head regions, symmetric and reactive to eye opening and eye closing. Sleep was characterized by vertex waves, sleep spindles (12 to 14 Hz), maximal frontocentral region. There is intermittent generalized 3 to 6 Hz theta-delta slowing. Hyperventilation and photic stimulation were not performed.     ABNORMALITY - Intermittent slow, generalized   IMPRESSION: This study is suggestive of generalized cerebral dysfunction (encephalopathy). No seizures or epileptiform discharges were seen throughout the recording.   Wyman Meschke O Katherene Dinino

## 2024-09-30 NOTE — Progress Notes (Signed)
 PROGRESS NOTE    [name]  FMW:[id number] DOB: [birth year] DOA: 09/29/2024 PCP: Onita Rush, MD   Brief Narrative:  This is a pleasant 78 year old female followed by Dr. Elmira with a history of nonischemic cardiomyopathy, pericardial effusion status post pericardiocentesis (01/2021) , HFrEF, nonobstructive coronary disease and permanent A-fib on Eliquis  along with aortic stenosis and stage IIIb chronic kidney disease. She was recently seen for preoperative clearance for cataract surgery and had reported some dizziness and symptoms suggesting possible overdiuresis. Her diuretics were reduced. She also has a history of hyponatremia. LVEF was 30 to 35%. She now presents over the past 5 days with increasing agitation, lower extremity edema, shortness of breath and visual and auditory hallucinations. She had a similar episode in November 2024. Her PCP diagnosed her with a UTI few days ago and she was started on antibiotics but not improving.  In the emergency department she was noted to be in A-fib with heart rate of 90, creatinine was elevated 2.55 above her baseline of 1.5. BNP was elevated at 762 and lactate was 2.2. Chest x-ray showed scarring and possible infiltrate. CT chest showed moderate right and small left pleural effusions with underlying chronic interstitial lung disease. Urinalysis showed nitrite and leukoesterase negative but positive for rare bacteria and she was continued on IV ceftriaxone .   Assessment & Plan:   Principal Problem:   Somnolence  Somnolence/acute metabolic encephalopathy/acute ischemic stroke: Due to encephalopathy of unknown source, patient ended up having CT head which was unremarkable followed by MRI brain which showed Punctate acute ischemic nonhemorrhagic infarct in the high posterior left frontal lobe, precentral gyrus.  Patient already on Eliquis .  No signs of hemorrhage.  Patient is more alert and oriented now.  I have consulted neurology.  Lipid panel was  checked last month and LDL was only 49.  She appears to be taking Crestor  20 mg at home which I will resume.  Hemoglobin A1c only 6.0.  Will consult PT OT as well.  Neurology is ordered carotid Doppler ultrasound as well as MRA brain.  Recommended to continue Eliquis .  No recommendation for and no need for permissive hypertension.  Asymptomatic bacteriuria: Patient afebrile, no leukocytosis, UA not impressive for UTI.  I do not think this is true UTI, will discontinue antibiotics.  AKI on CKD stage IIIb: Baseline creatinine 1.5 but presented with 2.5.  Cardiology believes this is cardiorenal syndrome.  It is to be noted that patient was started on Bactrim DS by PCP for UTI which has likely contributed to AKI as well.  They want to continue Lasix .   I will avoid any other nephrotoxic agents.  But will defer to cardiology for Lasix .  Lactic acidosis/?  Dehydration: Patient had lactic acidosis, likely due to dehydration due to poor p.o. intake in the setting of acute encephalopathy.  Presented with lactic acid of 2.2 which improved initially but then got worse to 3.2.  Repeating lactic acid this morning.  Acute on chronic systolic congestive heart failure: Cardiology believes patient is volume overloaded and they have started patient on IV Lasix .  Will defer to cardiology.  Hypoglycemia: No prior history of diabetes, blood sugar this morning was 58, hemoglobin A1c 6.0.  Likely due to poor p.o. intake.  Giving dextrose  now.  Now she is alert and oriented, will start her on the diet.  Chronic A-fib/prolonged QTc: Does not appear to be on any rate control medications.  Continue Eliquis .  Management per cardiology.  Acquired hypothyroidism: Continue Synthroid .  DVT prophylaxis: Eliquis    Code Status: Full Code  Family Communication:  None present at bedside.  Plan of care discussed with patient in length and he/she verbalized understanding and agreed with it.  Status is: Inpatient Remains inpatient  appropriate because: Patient needs further stroke workup and awaiting reassessment by cardiology.   Estimated body mass index is 29.38 kg/m as calculated from the following:   Height as of 08/30/24: 5' 6 (1.676 m).   Weight as of 08/30/24: 82.6 kg.    Nutritional Assessment: There is no height or weight on file to calculate BMI.. Seen by dietician.  I agree with the assessment and plan as outlined below: Nutrition Status:        . Skin Assessment: I have examined the patient's skin and I agree with the wound assessment as performed by the wound care RN as outlined below:    Consultants:  Cardiology and neurology  Procedures:  As above  Antimicrobials:  Anti-infectives (From admission, onward)    Start     Dose/Rate Route Frequency Ordered Stop   09/30/24 1000  cefTRIAXone  (ROCEPHIN ) 2 g in sodium chloride  0.9 % 100 mL IVPB        2 g 200 mL/hr over 30 Minutes Intravenous Every 24 hours 09/29/24 1730     09/29/24 1500  cefTRIAXone  (ROCEPHIN ) 1 g in sodium chloride  0.9 % 100 mL IVPB        1 g 200 mL/hr over 30 Minutes Intravenous  Once 09/29/24 1456 09/29/24 1721         Subjective: Patient seen and examined in the ED.  Patient is now fully alert and oriented.  She has no complaints at all.  She remembers the reason for coming to the hospital.  She says that she was brought in by her daughter because she was feeling very weak lately and her daughter wanted her to be checked.  Objective: Vitals:   09/30/24 0600 09/30/24 0756 09/30/24 0800 09/30/24 0801  BP: 102/86 110/70 107/71   Pulse: 81 90 91   Resp: 17 17 16    Temp:    (!) 97.4 F (36.3 C)  TempSrc:    Oral  SpO2: 100% 100% 98%     Intake/Output Summary (Last 24 hours) at 09/30/2024 0816 Last data filed at 09/29/2024 1857 Gross per 24 hour  Intake 158.92 ml  Output 220 ml  Net -61.08 ml   There were no vitals filed for this visit.  Examination:  General exam: Appears calm and comfortable   Respiratory system: Clear to auscultation. Respiratory effort normal. Cardiovascular system: S1 & S2 heard, RRR. No JVD, murmurs, rubs, gallops or clicks.  +1 pitting edema bilateral lower extremity. Gastrointestinal system: Abdomen is nondistended, soft and nontender. No organomegaly or masses felt. Normal bowel sounds heard. Central nervous system: Alert and oriented. No focal neurological deficits. Extremities: Symmetric 5 x 5 power. Skin: No rashes, lesions or ulcers Psychiatry: Judgement and insight appear normal. Mood & affect appropriate.    Data Reviewed: I have personally reviewed following labs and imaging studies  CBC: Recent Labs  Lab 09/29/24 1303 09/29/24 1647 09/30/24 0442  WBC 10.3  --  8.0  NEUTROABS 8.4*  --   --   HGB 13.6 15.0 12.5  HCT 42.6 44.0 39.4  MCV 82.1  --  83.8  PLT 258  --  206   Basic Metabolic Panel: Recent Labs  Lab 09/29/24 1303 09/29/24 1647 09/29/24 2110 09/30/24 0442  NA 132* 130*  --  138  K 3.4* 4.3  --  3.6  CL 89*  --   --  94*  CO2 24  --   --  28  GLUCOSE 83  --   --  76  BUN 50*  --   --  48*  CREATININE 2.55*  --   --  2.49*  CALCIUM  8.7*  --   --  8.5*  MG  --   --  2.4  --   PHOS  --   --   --  5.8*   GFR: CrCl cannot be calculated (Unknown ideal weight.). Liver Function Tests: Recent Labs  Lab 09/29/24 1303 09/30/24 0442  AST 24 22  ALT 22 20  ALKPHOS 41 42  BILITOT 0.7 0.7  PROT 5.4* 5.7*  ALBUMIN 3.7 3.2*   No results for input(s): LIPASE, AMYLASE in the last 168 hours. Recent Labs  Lab 09/29/24 1304  AMMONIA 19   Coagulation Profile: No results for input(s): INR, PROTIME in the last 168 hours. Cardiac Enzymes: No results for input(s): CKTOTAL, CKMB, CKMBINDEX, TROPONINI in the last 168 hours. BNP (last 3 results) Recent Labs    08/20/24 1347  PROBNP 12,768*   HbA1C: Recent Labs    09/29/24 2110  HGBA1C 5.9*   CBG: Recent Labs  Lab 09/30/24 0003 09/30/24 0800  GLUCAP  73 58*   Lipid Profile: No results for input(s): CHOL, HDL, LDLCALC, TRIG, CHOLHDL, LDLDIRECT in the last 72 hours. Thyroid  Function Tests: No results for input(s): TSH, T4TOTAL, FREET4, T3FREE, THYROIDAB in the last 72 hours. Anemia Panel: No results for input(s): VITAMINB12, FOLATE, FERRITIN, TIBC, IRON, RETICCTPCT in the last 72 hours. Sepsis Labs: Recent Labs  Lab 09/29/24 1616 09/29/24 1833 09/29/24 2102  LATICACIDVEN 2.2* 1.0 3.2*    Recent Results (from the past 240 hours)  Respiratory (~20 pathogens) panel by PCR     Status: None   Collection Time: 09/29/24  4:51 PM   Specimen: Nasopharyngeal Swab; Respiratory  Result Value Ref Range Status   Adenovirus NOT DETECTED NOT DETECTED Final   Coronavirus 229E NOT DETECTED NOT DETECTED Final    Comment: (NOTE) The Coronavirus on the Respiratory Panel, DOES NOT test for the novel  Coronavirus (2019 nCoV)    Coronavirus HKU1 NOT DETECTED NOT DETECTED Final   Coronavirus NL63 NOT DETECTED NOT DETECTED Final   Coronavirus OC43 NOT DETECTED NOT DETECTED Final   Metapneumovirus NOT DETECTED NOT DETECTED Final   Rhinovirus / Enterovirus NOT DETECTED NOT DETECTED Final   Influenza A NOT DETECTED NOT DETECTED Final   Influenza B NOT DETECTED NOT DETECTED Final   Parainfluenza Virus 1 NOT DETECTED NOT DETECTED Final   Parainfluenza Virus 2 NOT DETECTED NOT DETECTED Final   Parainfluenza Virus 3 NOT DETECTED NOT DETECTED Final   Parainfluenza Virus 4 NOT DETECTED NOT DETECTED Final   Respiratory Syncytial Virus NOT DETECTED NOT DETECTED Final   Bordetella pertussis NOT DETECTED NOT DETECTED Final   Bordetella Parapertussis NOT DETECTED NOT DETECTED Final   Chlamydophila pneumoniae NOT DETECTED NOT DETECTED Final   Mycoplasma pneumoniae NOT DETECTED NOT DETECTED Final    Comment: Performed at [facility], [address] [place], [place] [address]     Radiology Studies: MR BRAIN WO  CONTRAST Result Date: 09/29/2024 EXAM: MRI BRAIN WITHOUT CONTRAST 09/29/2024 11:19:54 PM TECHNIQUE: Multiplanar multisequence MRI of the head/brain was performed without the administration of intravenous contrast. COMPARISON: CT performed earlier the same day. CLINICAL HISTORY: Mental status change,  unknown cause. FINDINGS: BRAIN AND VENTRICLES: Mild chronic microvascular ischemic disease noted involving the supratentorial cerebral white matter. Punctate focus of restricted diffusion seen involving the high posterior left frontal lobe, precentral gyrus (series 5, image 91), consistent with a tiny acute ischemic infarct. No associated hemorrhage. No mass. No midline shift. No hydrocephalus. The sella is unremarkable. Normal flow voids. ORBITS: Prior bilateral ocular lens replacement. SINUSES AND MASTOIDS: Small right mastoid effusion noted, of doubtful significance. BONES AND SOFT TISSUES: Hyperostosis frontalis interna noted. Normal marrow signal. No acute soft tissue abnormality. IMPRESSION: 1. Punctate acute ischemic nonhemorrhagic infarct in the high posterior left frontal lobe, precentral gyrus. 2. Underlying mild chronic microvascular ischemic disease. Electronically signed by: Morene Hoard MD 09/29/2024 11:54 PM EDT RP Workstation: [id number]   CT HEAD WO CONTRAST ( ) Result Date: 09/29/2024 CLINICAL DATA:  Shortness of breath altered EXAM: CT HEAD WITHOUT CONTRAST TECHNIQUE: Contiguous axial images were obtained from the base of the skull through the vertex without intravenous contrast. RADIATION DOSE REDUCTION: This exam was performed according to the departmental dose-optimization program which includes automated exposure control, adjustment of the mA and/or kV according to patient size and/or use of iterative reconstruction technique. COMPARISON:  MRI 10/08/2023, CT brain 10/08/2023 FINDINGS: Brain: No acute territorial infarction, hemorrhage or intracranial mass. Mild chronic small vessel  ischemic changes of the white matter. Stable ventricle size Vascular: No hyperdense vessels.  Carotid vascular calcification Skull: Normal. Negative for fracture or focal lesion. Sinuses/Orbits: No acute finding. Other: None IMPRESSION: 1. No CT evidence for acute intracranial abnormality. 2. Mild chronic small vessel ischemic changes of the white matter. Electronically Signed   By: Luke Bun M.D.   On: 09/29/2024 16:03   CT Chest Wo Contrast Result Date: 09/29/2024 CLINICAL DATA:  Shortness of breath EXAM: CT CHEST WITHOUT CONTRAST TECHNIQUE: Multidetector CT imaging of the chest was performed following the standard protocol without IV contrast. RADIATION DOSE REDUCTION: This exam was performed according to the departmental dose-optimization program which includes automated exposure control, adjustment of the mA and/or kV according to patient size and/or use of iterative reconstruction technique. COMPARISON:  Chest x-ray 09/29/2024, 10/08/2023 FINDINGS: Cardiovascular: Limited evaluation without intravenous contrast. Moderate severe aortic atherosclerosis. Multi vessel coronary vascular calcification. Cardiomegaly. Aortic valvular calcification. No significant pericardial effusion Mediastinum/Nodes: Patent trachea. No thyroid  mass. Few mildly enlarged lymph nodes, AP window nodes up to 12 mm. Right paratracheal nodes measuring up to 11 mm. Subcarinal node measuring up to 18 mm. Esophagus within normal limits. Lungs/Pleura: Moderate right and small moderate left pleural effusions. Underlying chronic interstitial lung disease with subpleural reticulation and mild bronchiectasis. Smooth interstitial densities within the bilateral lungs with diffuse bilateral ground-glass disease, suspect that there is superimposed edema. Upper Abdomen: Gallstones.  No acute finding Musculoskeletal: Degenerative changes. No acute osseous abnormality. IMPRESSION: 1. Cardiomegaly with moderate right and small to moderate left  pleural effusions. 2. Underlying chronic interstitial lung disease with subpleural reticulation and mild bronchiectasis. Smooth interstitial densities within the bilateral lungs with diffuse bilateral ground-glass disease, suspect that there is edema superimposed on chronic interstitial lung disease. 3. Mildly enlarged mediastinal lymph nodes, likely reactive. 4. Gallstones. 5. Aortic atherosclerosis. Aortic Atherosclerosis (ICD10-I70.0). Electronically Signed   By: Luke Bun M.D.   On: 09/29/2024 16:00   DG Chest 1 View Result Date: 09/29/2024 EXAM: 1 VIEW(S) XRAY OF THE CHEST 09/29/2024 01:22:00 PM COMPARISON: 10/08/2023 CLINICAL HISTORY: Cough, shortness of breath. FINDINGS: LUNGS AND PLEURA: Interstitial prominence again noted throughout the lungs similar to prior  study, favor scarring. Patchy airspace opacities peripherally in the left mid lung and lung base slightly increased since prior study, favor chronic scarring although acute infiltrate cannot be excluded. No pulmonary edema. No pleural effusion. No pneumothorax. HEART AND MEDIASTINUM: No acute abnormality of the cardiac and mediastinal silhouettes. Aortic atherosclerosis BONES AND SOFT TISSUES: No acute osseous abnormality. IMPRESSION: 1. Interstitial prominence throughout the lungs, similar to prior study, favoring scarring. 2. Patchy peripheral opacities in the left mid to lower lung, slightly increased from prior, favored chronic scarring; superimposed acute infiltrate cannot be excluded. Electronically signed by: Kevin Dover MD 09/29/2024 02:29 PM EDT RP Workstation: [id number]    Scheduled Meds:  dextrose        furosemide   40 mg Intravenous BID   potassium chloride   20 mEq Oral Daily   sodium chloride  flush  3 mL Intravenous Q12H   Continuous Infusions:  cefTRIAXone  (ROCEPHIN )  IV       LOS: 1 day   Fredia Skeeter, MD [facility]  09/30/2024, 8:16 AM   *Please note that this is a verbal dictation therefore any  spelling or grammatical errors are due to the Dragon Medical One system interpretation.  Please page via Amion and do not message via secure chat for urgent patient care matters. Secure chat can be used for non urgent patient care matters.  How to contact the [facility] Attending or Consulting provider 7A - 7P or covering provider during after hours 7P -7A, for this patient?  Check the care team in [facility] and look for a) attending/consulting [facility] provider listed and b) the [facility] team listed. Page or secure chat 7A-7P. Log into [email or web address] and use [place]'s universal password to access. If you do not have the password, please contact the hospital operator. Locate the [facility] provider you are looking for under [facility] and page to a number that you can be directly reached. If you still have difficulty reaching the provider, please page the [facility] (Director on Call) for the Hospitalists listed on amion for assistance.

## 2024-09-30 NOTE — Consult Note (Addendum)
 NEUROLOGY CONSULT NOTE   Date of service: September 30, 2024 Patient Name: [name] MRN:  [id number] DOB:  November 01, 1946 Chief Complaint: Lethargy and disorientation Requesting Provider: Vernon Ranks, MD  History of Present Illness  [name] is a 78 y.o. female with hx of coronary artery disease, hypertension, hyperlipidemia, obesity, atrial fibrillation on Eliquis , systolic CHF, hypothyroidism came in for evaluation of shortness of breath, lower extremity edema, lethargy and disorientation going on for a few days.  Exact last known well is unclear at this time. Had cataract surgery done a week ago and has been somewhat disoriented since then.  Also noted to have a UTI and started on antibiotics on Monday. Encephalopathy workup included an MRI that showed a punctate high frontal area of infarct.  Neurology was consulted for that. She is being treated for acute on chronic systolic congestive heart failure LVEF 30 to 35%, UTI as well as possible cardiorenal syndrome with acute on chronic renal insufficiency.  She has been treated with diuretics.  LKW: Multiple days ago-exact time unclear Modified rankin score: 4 IV Thrombolysis: Unclear last known well EVT: Unclear last known well  NIHSS components Score: Comment  1a Level of Conscious 0[x]  1[]  2[]  3[]      1b LOC Questions 0[x]  1[]  2[]       1c LOC Commands 0[x]  1[]  2[]       2 Best Gaze 0[x]  1[]  2[]       3 Visual 0[x]  1[]  2[]  3[]      4 Facial Palsy 0[x]  1[]  2[]  3[]      5a Motor Arm - left 0[x]  1[]  2[]  3[]  4[]  UN[]    5b Motor Arm - Right 0[x]  1[]  2[]  3[]  4[]  UN[]    6a Motor Leg - Left 0[]  1[x]  2[]  3[]  4[]  UN[]    6b Motor Leg - Right 0[]  1[x]  2[]  3[]  4[]  UN[]    7 Limb Ataxia 0[x]  1[]  2[]  UN[]      8 Sensory 0[x]  1[]  2[]  UN[]      9 Best Language 0[x]  1[]  2[]  3[]      10 Dysarthria 0[x]  1[]  2[]  UN[]      11 Extinct. and Inattention 0[x]  1[]  2[]       TOTAL: 2      ROS  Comprehensive ROS performed and pertinent positives documented  in HPI   Past History   Past Medical History:  Diagnosis Date   Aortic stenosis    a. 06/2024 Echo: ? low-flow/low-gradient AS; b. 06/2024 Ca2+ score: AoV 891 (not severe).   Arthritis    CAD (coronary artery disease) - nonobstructive    a. 08/2023 Cath: LM nl, LAD 40p/m, 20 side branch, LCx 40p/m, RCA large, nl; b. 06/2024 Cardiac CT: Ca2+ = 1565 (96th%'ile). LM 0, LAD 829, LCX 276, RCA 459.   Chronic HFrEF (heart failure with reduced ejection fraction) (HCC)    a. 01/2021 Echo: EF 50-55%; b. 08/2023 Echo: EF 30-35%; c. 08/2023 Cath: mild nonobs CAD. CO/CI 4.29/2.09; d. 06/2024 Echo: EF 30-35%, septal/inf HK.   CKD (chronic kidney disease), stage III (HCC)    Colon polyps    Goiter    Hyperglycemia    Hyperlipidemia    Hypertension    Hypothyroidism    Mitral regurgitation    Morbid obesity (HCC)    NICM (nonischemic cardiomyopathy) (HCC)    a. 01/2021 Echo: EF 50-55%; b. 08/2023 Echo: EF 30-35%; c. 08/2023 Cath: mild nonobs CAD; d. 06/2024 Echo: EF 30-35%, septal/inf HK, low-nl RV fxn, RVSP 47.39mmHg, sev LAD, mild RAE, mild-mod  MR/TR, mild AI, ? low-flow/low-grad AS, mod PR.   Osteopenia    Pericardial effusion    a. 01/2021 s/p pericardiocentesis-500 mL.   Permanent atrial fibrillation (HCC)    a. CHA2DS2VASc = 6-->eliquis .   Pleural effusion, left    a. 01/2021 s/p L thoracentesis-850 mL.   Pulmonary hypertension (HCC)    a. 08/2023 RHC: PA 69/34 (47), PCWP 33-35.   Tricuspid regurgitation     Past Surgical History:  Procedure Laterality Date   CARDIOVASCULAR STRESS TEST     cataract surgery     COLONOSCOPY     IR THORACENTESIS ASP PLEURAL SPACE W/IMG GUIDE  01/26/2021   LUMBAR DISC SURGERY  2012   PERICARDIOCENTESIS N/A 01/24/2021   Procedure: PERICARDIOCENTESIS;  Surgeon: Ladona Heinz, MD;  Location: [facility] INVASIVE CV LAB;  Service: Cardiovascular;  Laterality: N/A;   POLYPECTOMY     RIGHT/LEFT HEART CATH AND CORONARY ANGIOGRAPHY N/A 09/01/2023   Procedure: RIGHT/LEFT HEART CATH AND  CORONARY ANGIOGRAPHY;  Surgeon: Anner Alm ORN, MD;  Location: [facility] INVASIVE CV LAB;  Service: Cardiovascular;  Laterality: N/A;   TOTAL KNEE ARTHROPLASTY Left 05/29/2017   Procedure: LEFT TOTAL KNEE ARTHROPLASTY;  Surgeon: Jerri Kay HERO, MD;  Location: [facility] OR;  Service: Orthopedics;  Laterality: Left;   TUBAL LIGATION  1977   UPPER GASTROINTESTINAL ENDOSCOPY  12/29/2019   US  ECHOCARDIOGRAPHY      Family History: Family History  Problem Relation Age of Onset   Heart disease Mother    CVA Mother    Heart disease Father    Heart disease Brother    Colon cancer Neg Hx    Stomach cancer Neg Hx    Colon polyps Neg Hx    Esophageal cancer Neg Hx    Rectal cancer Neg Hx     Social History  reports that she has never smoked. She has never used smokeless tobacco. She reports that she does not drink alcohol  and does not use drugs.  Allergies  Allergen Reactions   Spironolactone  Other (See Comments)    Hyponatremia-  the level of sodium in blood is too high   Atorvastatin Other (See Comments)    Aches     Medications   Current Facility-Administered Medications:    acetaminophen  (TYLENOL ) tablet 650 mg, 650 mg, Oral, Q6H PRN **OR** acetaminophen  (TYLENOL ) suppository 650 mg, 650 mg, Rectal, Q6H PRN, Tobie Mario GAILS, MD   cefTRIAXone  (ROCEPHIN ) 2 g in sodium chloride  0.9 % 100 mL IVPB, 2 g, Intravenous, Q24H, Patel, Ekta V, MD   dextrose  50 % solution, , , ,    furosemide  (LASIX ) injection 40 mg, 40 mg, Intravenous, BID, Vivienne Lonni Ingle, NP, 40 mg at 09/30/24 9243   potassium chloride  SA (KLOR-CON  M) CR tablet 20 mEq, 20 mEq, Oral, Daily, Vivienne Lonni Ingle, NP, 20 mEq at 09/29/24 2028   rosuvastatin  (CRESTOR ) tablet 20 mg, 20 mg, Oral, Daily, Pahwani, Ravi, MD   sodium chloride  flush (NS) 0.9 % injection 3 mL, 3 mL, Intravenous, Q12H, Tobie Mario GAILS, MD  Current Outpatient Medications:    acetaminophen  (TYLENOL ) 325 MG tablet, Take 325-650 mg by mouth daily as needed  for mild pain or headache., Disp: , Rfl:    apixaban  (ELIQUIS ) 5 MG TABS tablet, Take 1 tablet (5 mg total) by mouth 2 (two) times daily., Disp: 60 tablet, Rfl: 5   Calcium  Carb-Cholecalciferol  (CALTRATE 600+D3 PO), Take 1 tablet by mouth every evening., Disp: , Rfl:    [employer] IRON 325 (65 Fe)  MG tablet, Take 325 mg by mouth daily at 6 PM., Disp: , Rfl:    donepezil (ARICEPT) 5 MG tablet, Take 5 mg by mouth at bedtime., Disp: , Rfl:    empagliflozin  (JARDIANCE ) 10 MG TABS tablet, Take 1 tablet (10 mg total) by mouth daily., Disp: 30 tablet, Rfl: 11   furosemide  (LASIX ) 40 MG tablet, Take 1 tablet daily and can take an additional half tablet as needed for swelling or fluid overload (Patient taking differently: Take 40 mg by mouth 2 (two) times daily. Can take an additional tablet as needed for swelling or fluid overload), Disp: 100 tablet, Rfl: 1   isosorbide  mononitrate (IMDUR ) 30 MG 24 hr tablet, TAKE 1 TABLET BY MOUTH EVERY DAY, Disp: 90 tablet, Rfl: 3   lactulose  (CHRONULAC ) 10 GM/15ML solution, Take 15 mLs (10 g total) by mouth every other day. (Patient taking differently: Take 10 g by mouth every 7 (seven) days.), Disp: 236 mL, Rfl: 0   levothyroxine  (SYNTHROID , LEVOTHROID) 125 MCG tablet, Take 125 mcg by mouth daily before breakfast., Disp: , Rfl:    metoprolol  succinate (TOPROL -XL) 100 MG 24 hr tablet, TAKE 1 TABLET BY MOUTH DAILY. TAKE WITH OR IMMEDIATELY FOLLOWING A MEAL., Disp: 90 tablet, Rfl: 3   omeprazole  (PRILOSEC) 20 MG capsule, TAKE 1 CAPSULE BY MOUTH EVERY DAY, Disp: 90 capsule, Rfl: 1   Oxymetazoline HCl (SINEX ULTRA FINE MIST 12-HOUR NA), Place 1 spray into both nostrils every 12 (twelve) hours as needed (for congestion)., Disp: , Rfl:    rosuvastatin  (CRESTOR ) 20 MG tablet, Take 1 tablet (20 mg total) by mouth daily., Disp: 90 tablet, Rfl: 3   sulfamethoxazole-trimethoprim (BACTRIM) 400-80 MG tablet, Take 1 tablet by mouth 2 (two) times daily., Disp: , Rfl:    valproic acid (DEPAKENE)  250 MG capsule, Take 250 mg by mouth at bedtime., Disp: , Rfl:    vitamin B-12 (CYANOCOBALAMIN ) 1000 MCG tablet, Take 1,000 mcg by mouth daily., Disp: , Rfl:    BESIVANCE 0.6 % SUSP, Place 1 drop into both eyes 4 (four) times daily., Disp: , Rfl:    Bromfenac Sodium 0.07 % SOLN, Apply 1 drop to eye 2 (two) times daily., Disp: , Rfl:    DUREZOL 0.05 % EMUL, Place 1 drop into the right eye 4 (four) times daily., Disp: , Rfl:    prednisoLONE acetate (PRED FORTE) 1 % ophthalmic suspension, Place 1 drop into both eyes 4 (four) times daily., Disp: , Rfl:    VISINE 0.025-0.3 % ophthalmic solution, Place 1 drop into both eyes 2 (two) times daily as needed for eye irritation., Disp: , Rfl:   Vitals   Vitals:   09/30/24 0600 09/30/24 0756 09/30/24 0800 09/30/24 0801  BP: 102/86 110/70 107/71   Pulse: 81 90 91   Resp: 17 17 16    Temp:    (!) 97.4 F (36.3 C)  TempSrc:    Oral  SpO2: 100% 100% 98%     There is no height or weight on file to calculate BMI.   Physical Exam   General: Awake alert in no distress HEENT: Normocephalic atraumatic Lungs: Clear Cardiovascular: Irregularly irregular Extremities: 1+ pitting edema.  Neurologic Examination  Very pleasant lady getting her EEG done at the time of this examination Awake alert oriented x 3. Somewhat diminished attention concentration No dysarthria No aphasia Cranial nerves II to XII intact Motor examination with no drift in the upper extremities Very mild drift in both lower extremities-somewhat limited by pain. Sensation  intact to light touch without extinction Coordination examination reveals no dysmetria  Labs/Imaging/Neurodiagnostic studies   CBC:  Recent Labs  Lab 10/20/24 1303 20-Oct-2024 1647 09/30/24 0442  WBC 10.3  --  8.0  NEUTROABS 8.4*  --   --   HGB 13.6 15.0 12.5  HCT 42.6 44.0 39.4  MCV 82.1  --  83.8  PLT 258  --  206   Basic Metabolic Panel:  Lab Results  Component Value Date   NA 138 09/30/2024   K 3.6  09/30/2024   CO2 28 09/30/2024   GLUCOSE 76 09/30/2024   BUN 48 (H) 09/30/2024   CREATININE 2.49 (H) 09/30/2024   CALCIUM  8.5 (L) 09/30/2024   GFRNONAA 19 (L) 09/30/2024   GFRAA 44 (L) 05/30/2017   Lipid Panel:  Lab Results  Component Value Date   LDLCALC 49 08/20/2024   HgbA1c:  Lab Results  Component Value Date   HGBA1C 5.9 (H) 20-Oct-2024   Urine Drug Screen:     Component Value Date/Time   LABOPIA NONE DETECTED 10/08/2023 1454   COCAINSCRNUR NONE DETECTED 10/08/2023 1454   LABBENZ NONE DETECTED 10/08/2023 1454   AMPHETMU NONE DETECTED 10/08/2023 1454   THCU NONE DETECTED 10/08/2023 1454   LABBARB NONE DETECTED 10/08/2023 1454    Alcohol  Level     Component Value Date/Time   ETH <10 10/08/2023 1629   INR  Lab Results  Component Value Date   INR 1.6 (H) 06/19/2021   APTT  Lab Results  Component Value Date   APTT 32 09/01/2023     Brain MRI personally reviewed-punctate area of ischemic infarct in the high posterior left frontal lobe precentral gyrus.  Underlying chronic microvascular disease CT head unremarkable for acute process  EEG completed-results pending  ASSESSMENT   [name] is a 78 y.o. female with above past medical history who came in for evaluation of shortness of breath, lower extremity edema, lethargy and confusion ongoing for multiple days, found to be in acute renal failure, likely a component of cardiorenal syndrome.  Encephalopathy workup revealed a punctate infarct in the left frontal lobe.  She has a history of atrial fibrillation and is on anticoagulation.  I believe the stroke is incidental and has no bearing on her current clinical presentation and is likely from cardioembolic etiology.  Her last known well being unclear and multiple days ago, it is okay to continue her anticoagulation. She had a recent echocardiogram less than 3 months ago that showed a EF of 30 to 35%-cardiology following-appreciate input. Her LDL was 49 a month  ago-at goal Her A1c is 5.9-at goal Given the fact that she is already on anticoagulation, I would recommend getting an MRI of the head without contrast and carotid Dopplers to complete the head and neck vessel imaging and get therapy assessments which would complete her stroke risk factor workup.  Her encephalopathy/confusion/lethargy is likely related to the uremia.  Impression: Incidental punctate stroke-likely cardioembolic Toxic metabolic encephalopathy in the setting of acute on chronic renal failure    RECOMMENDATIONS  Frequent neurochecks Telemetry Okay to continue Eliquis  Continue home statin--LDL at goal MRA head without contrast Carotid Dopplers Therapy assessments No need for permissive hypertension Medical management of the medical comorbidities per primary team and cardiology as you are. If the MRA of the head , carotid Dopplers or EEG show any concerning findings, I we will update my recommendations. She will need outpatient follow-up with neurology in 8 to 12 weeks Plan was relayed to  Dr. Vernon ______________________________________________________________________    Signed, Eligio Lav, MD [facility]

## 2024-09-30 NOTE — TOC CM/SW Note (Signed)
 Transition of Care [facility]) - Inpatient Brief Assessment   Patient Details  Name: LORRENE GRAEF MRN: [id number] Date of Birth: 10-Mar-1946  Transition of Care [facility]) CM/SW Contact:    Waddell Barnie Rama, RN Phone Number: 09/30/2024, 3:24 PM   Clinical Narrative: From home with spouse, has PCP and insurance on file, states has no HH services in place at this time, has walker, cane, w/chair and a walk in shower at home.  States family member (spouse)  will transport them home at [employer] and family is support system, states gets medications [facility] [place].  Pta self ambulatory with walker. There are no ICM  needs identified  at this time.  Please place consult for ICM needs.     Transition of Care Asessment: Insurance and Status: Insurance coverage has been reviewed Patient has primary care physician: Yes Home environment has been reviewed: home with spouse Prior level of function:: ambulatory with walker Prior/Current Home Services: Current home services (walker., cane, w/chair, walk in shower) Social Drivers of Health Review: SDOH reviewed no interventions necessary Readmission risk has been reviewed: Yes Transition of care needs: no transition of care needs at this time

## 2024-09-30 NOTE — Progress Notes (Signed)
 Routine EEG completed, results pending Neurology review and interpretation

## 2024-09-30 NOTE — ED Notes (Signed)
 Patient care taken over. Patient rounded on and updated on plan of care. Patient resting in bed with regular, unlabored breathing.

## 2024-10-01 ENCOUNTER — Inpatient Hospital Stay (HOSPITAL_COMMUNITY)

## 2024-10-01 ENCOUNTER — Other Ambulatory Visit: Payer: Self-pay

## 2024-10-01 DIAGNOSIS — I4821 Permanent atrial fibrillation: Secondary | ICD-10-CM | POA: Diagnosis not present

## 2024-10-01 DIAGNOSIS — I509 Heart failure, unspecified: Secondary | ICD-10-CM

## 2024-10-01 DIAGNOSIS — I501 Left ventricular failure: Secondary | ICD-10-CM | POA: Diagnosis not present

## 2024-10-01 DIAGNOSIS — I5021 Acute systolic (congestive) heart failure: Secondary | ICD-10-CM | POA: Diagnosis not present

## 2024-10-01 DIAGNOSIS — R570 Cardiogenic shock: Secondary | ICD-10-CM

## 2024-10-01 DIAGNOSIS — E872 Acidosis, unspecified: Secondary | ICD-10-CM

## 2024-10-01 DIAGNOSIS — R400 Somnolence: Secondary | ICD-10-CM | POA: Diagnosis not present

## 2024-10-01 DIAGNOSIS — N179 Acute kidney failure, unspecified: Secondary | ICD-10-CM | POA: Diagnosis not present

## 2024-10-01 DIAGNOSIS — I639 Cerebral infarction, unspecified: Secondary | ICD-10-CM

## 2024-10-01 LAB — ECHOCARDIOGRAM LIMITED
AR max vel: 0.84 cm2
AV Area VTI: 0.82 cm2
AV Area mean vel: 0.89 cm2
AV Mean grad: 16 mmHg
AV Peak grad: 29.2 mmHg
Ao pk vel: 2.7 m/s
Area-P 1/2: 6.07 cm2
Calc EF: 23.3 %
Height: 66 in
S' Lateral: 4.6 cm
Single Plane A2C EF: 19 %
Single Plane A4C EF: 22.8 %
Weight: 3178.15 [oz_av]

## 2024-10-01 LAB — GLUCOSE, CAPILLARY
Glucose-Capillary: 74 mg/dL (ref 70–99)
Glucose-Capillary: 89 mg/dL (ref 70–99)
Glucose-Capillary: 116 mg/dL — ABNORMAL HIGH (ref 70–99)

## 2024-10-01 LAB — MAGNESIUM: Magnesium: 2.4 mg/dL (ref 1.7–2.4)

## 2024-10-01 LAB — URINE CULTURE
Culture: <10000 — AB
Special Requests: NORMAL

## 2024-10-01 LAB — LACTIC ACID, PLASMA
Lactic Acid, Venous: 2.1 mmol/L (ref 0.5–1.9)
Lactic Acid, Venous: 4.2 mmol/L (ref 0.5–1.9)

## 2024-10-01 LAB — BASIC METABOLIC PANEL WITH GFR
Anion gap: 18 — ABNORMAL HIGH (ref 5–15)
BUN: 51 mg/dL — ABNORMAL HIGH (ref 8–23)
CO2: 23 mmol/L (ref 22–32)
Calcium: 8.5 mg/dL — ABNORMAL LOW (ref 8.9–10.3)
Chloride: 96 mmol/L — ABNORMAL LOW (ref 98–111)
Creatinine, Ser: 2.49 mg/dL — ABNORMAL HIGH (ref 0.44–1.00)
GFR, Estimated: 19 mL/min — ABNORMAL LOW (ref >60)
Glucose, Bld: 82 mg/dL (ref 70–99)
Potassium: 3.6 mmol/L (ref 3.5–5.1)
Sodium: 137 mmol/L (ref 135–145)

## 2024-10-01 MED ORDER — LEVOTHYROXINE SODIUM 25 MCG PO TABS
125.0000 ug | ORAL_TABLET | Freq: Every day | ORAL | Status: DC
  Administered 2024-10-02 – 2024-10-09 (×8): 125 ug via ORAL
  Filled 2024-10-01 (×8): qty 1

## 2024-10-01 MED ORDER — PANTOPRAZOLE SODIUM 40 MG PO TBEC
40.0000 mg | DELAYED_RELEASE_TABLET | Freq: Every day | ORAL | Status: DC
  Administered 2024-10-01 – 2024-10-09 (×9): 40 mg via ORAL
  Filled 2024-10-01 (×9): qty 1

## 2024-10-01 MED ORDER — FUROSEMIDE 10 MG/ML IJ SOLN: 60.0000 mg | Freq: Two times a day (BID) | INTRAMUSCULAR | Status: DC

## 2024-10-01 MED ORDER — METOPROLOL SUCCINATE ER 25 MG PO TB24
25.0000 mg | ORAL_TABLET | Freq: Every day | ORAL | Status: DC
Start: 2024-10-01 — End: 2024-10-01
  Administered 2024-10-01: 25 mg via ORAL
  Filled 2024-10-01: qty 1

## 2024-10-01 MED ORDER — SODIUM CHLORIDE 0.9% FLUSH
10.0000 mL | Freq: Two times a day (BID) | INTRAVENOUS | Status: DC
  Administered 2024-10-01: 10 mL
  Administered 2024-10-02: 20 mL
  Administered 2024-10-02 – 2024-10-09 (×12): 10 mL

## 2024-10-01 MED ORDER — SODIUM CHLORIDE 0.9% FLUSH: 10.0000 mL | INTRAVENOUS | Status: DC | PRN

## 2024-10-01 MED ORDER — PERFLUTREN LIPID MICROSPHERE
1.0000 mL | INTRAVENOUS | Status: AC | PRN
  Administered 2024-10-01: 2 mL via INTRAVENOUS

## 2024-10-01 MED ORDER — DOBUTAMINE-DEXTROSE 4-5 MG/ML-% IV SOLN
1.2500 ug/kg/min | INTRAVENOUS | Status: DC
  Administered 2024-10-01 – 2024-10-04 (×2): 2.5 ug/kg/min via INTRAVENOUS
  Filled 2024-10-01 (×3): qty 250

## 2024-10-01 MED ORDER — CHLORHEXIDINE GLUCONATE CLOTH 2 % EX PADS
6.0000 | MEDICATED_PAD | Freq: Every day | CUTANEOUS | Status: DC
  Administered 2024-10-01 – 2024-10-09 (×9): 6 via TOPICAL

## 2024-10-01 NOTE — Progress Notes (Signed)
 [facility] night cross cover note:   I was notified by the patient's RN that the patient transiently converted to atrial fibrillation with RVR, which occurred while using bedside commode, and lasted for approximately 15 minutes, during which time patient was asymptomatic.  Subsequently, the patient spontaneously converted back to sinus rhythm with ensuing heart rates in the 60s to 70s.  Additional vital signs appear stable, including afebrile, most recent blood pressure 112/78, respiratory rate 18-20, and oxygen saturation 97 to 100% on room air.  She has an existing order for BMP to be checked with morning labs.  To this, I have ordered a magnesium  level to be check with this morning's labs.  As the above transient episode of atrial fibrillation with RVR occurred while using bedside commode, I have, for now, discontinued order for no purewick.      Eva Pore, DO Hospitalist

## 2024-10-01 NOTE — Progress Notes (Signed)
 Labs showed persistent lactic acidosis.  Discussed her case with hospital medicine.  I do not feel she is volume down.  Repeat echo shows reduction in LVEF to 20% with large left pleural effusion.  Lactic acid 4.2.  I suspect she is in low output heart failure.  Discussed this with the family at the bedside.  We will stop her beta-blocker.  I have ordered a PICC line and Coox.  We will go ahead and start dobutamine 2.5 mcg/min/kg as a fixed dose.  This is not titratable.  We can then recheck a lactic acid after she has been on inotropes.  I discussed with the family and the patient that she is not a candidate for LVAD or heart transplantation.  She is only a candidate for palliative inotropes.  Hopefully we can put her on short term inotropes and her condition improves.  I addressed CODE STATUS with the family.  They agree she is not a good candidate for cardiopulmonary resuscitation or mechanical ventilation.  They do want to treat her to see if she can turn around but they do understand she has had congestive heart failure for quite some time.  They understand that her prognosis is likely poor.  They are in agreement with DO NOT RESUSCITATE.  We will continue with current scope of treatment but we will not escalate.  Primary team and nursing staff were updated of the plan.  CRITICAL CARE Performed by: Darryle T O'Neal  Total critical care time: 40 minutes. Critical care time was exclusive of separately billable procedures and treating other patients. Critical care was necessary to treat or prevent imminent or life-threatening deterioration. Critical care was time spent personally by me on the following activities: development of treatment plan with patient and/or surrogate as well as nursing, discussions with consultants, evaluation of patient's response to treatment, examination of patient, obtaining history from patient or surrogate, ordering and performing treatments and interventions, ordering and review  of laboratory studies, ordering and review of radiographic studies, pulse oximetry and re-evaluation of patient's condition.  Signed, Darryle DASEN. Barbaraann, MD, [facility]  [facility]  [address] [place], [place] [address] [phone]  3:00 PM

## 2024-10-01 NOTE — Plan of Care (Signed)

## 2024-10-01 NOTE — Progress Notes (Signed)
 PROGRESS NOTE    [name]  FMW:[id number] DOB: [birth year] DOA: 09/29/2024 PCP: Onita Rush, MD   Brief Narrative:  This is a pleasant 78 year old female followed by Dr. Elmira with a history of nonischemic cardiomyopathy, pericardial effusion status post pericardiocentesis (01/2021) , HFrEF, nonobstructive coronary disease and permanent A-fib on Eliquis  along with aortic stenosis and stage IIIb chronic kidney disease. She was recently seen for preoperative clearance for cataract surgery and had reported some dizziness and symptoms suggesting possible overdiuresis. Her diuretics were reduced. She also has a history of hyponatremia. LVEF was 30 to 35%. She now presents over the past 5 days with increasing agitation, lower extremity edema, shortness of breath and visual and auditory hallucinations. She had a similar episode in November 2024. Her PCP diagnosed her with a UTI few days ago and she was started on antibiotics but not improving.  In the emergency department she was noted to be in A-fib with heart rate of 90, creatinine was elevated 2.55 above her baseline of 1.5. BNP was elevated at 762 and lactate was 2.2. Chest x-ray showed scarring and possible infiltrate. CT chest showed moderate right and small left pleural effusions with underlying chronic interstitial lung disease. Urinalysis showed nitrite and leukoesterase negative but positive for rare bacteria and she was continued on IV ceftriaxone .   Assessment & Plan:   Principal Problem:   Somnolence  Somnolence/acute metabolic encephalopathy/acute ischemic stroke: Due to encephalopathy of unknown source, patient ended up having CT head which was unremarkable followed by MRI brain which showed Punctate acute ischemic nonhemorrhagic infarct in the high posterior left frontal lobe, precentral gyrus.  Neurology consulted.  Eliquis  resumed per their recommendation.  Lipid panel was checked last month and LDL was only 49.  She appears to be  taking Crestor  20 mg at home which we resumed.  Hemoglobin A1c only 6.0. Neurology ordered carotid Doppler ultrasound as well as MRA brain and EEG, all of them unremarkable.  Neurology signed off, recommending continuing Eliquis .  She was fully alert and oriented since yesterday morning.  Needs to be seen by PT OT.  Asymptomatic bacteriuria: Patient afebrile, no leukocytosis, UA not impressive for UTI.  I do not think this is true UTI, discontinued antibiotics.  AKI on CKD stage IIIb: Baseline creatinine 1.5 but presented with 2.5.  Cardiology believes this is cardiorenal syndrome.  It is to be noted that patient was started on Bactrim DS by PCP for UTI which has likely contributed to AKI as well.  Lasix  was continued by cardiology yesterday, creatinine has not improved today.    Lactic acidosis/?  Dehydration: Patient had lactic acidosis, likely due to dehydration due to poor p.o. intake in the setting of acute encephalopathy.  To me, patient does appear to be dehydrated.  Presented with lactic acid of 2.2 which improved initially but then got worse to 3.2 and now down to 2.1 again.  Discussed with cardiology Dr. CLEMENTEEN Betters, holding Lasix  for now.  Checking lactic acid.  May consider fluids if still elevated.  Acute on chronic systolic congestive heart failure: Cardiology believes patient is volume overloaded and therefore she was continued on Lasix  which has been discontinued this morning by cardiology.  PTA on Jardiance  which is on hold.  Cardiology considering PICC line and monitoring Co. ox.  Hypoglycemia: No prior history of diabetes, blood sugar yesterday morning was 58, hemoglobin A1c 6.0.  Likely due to poor p.o. intake.   Chronic A-fib/prolonged QTc: Rates were controlled yesterday but overnight  she had brief episode of A-fib with RVR.  PTA medication include Toprol -XL which is on hold.  Will defer to cardiology for that.  Rates are better controlled now.  She is asymptomatic.  Acquired  hypothyroidism: Continue Synthroid .  DVT prophylaxis: Eliquis    Code Status: Full Code  Family Communication: Husband and daughter present at bedside.  Plan of care discussed with patient in length and he/she verbalized understanding and agreed with it.  Status is: Inpatient Remains inpatient appropriate because: Still elevated lactic acidosis and elevated creatinine.   Estimated body mass index is 32.06 kg/m as calculated from the following:   Height as of this encounter: 5' 6 (1.676 m).   Weight as of this encounter: 90.1 kg.    Nutritional Assessment: Body mass index is 32.06 kg/m.SABRA Seen by dietician.  I agree with the assessment and plan as outlined below: Nutrition Status:        . Skin Assessment: I have examined the patient's skin and I agree with the wound assessment as performed by the wound care RN as outlined below:    Consultants:  Cardiology and neurology  Procedures:  As above  Antimicrobials:  Anti-infectives (From admission, onward)    Start     Dose/Rate Route Frequency Ordered Stop   09/30/24 1000  cefTRIAXone  (ROCEPHIN ) 2 g in sodium chloride  0.9 % 100 mL IVPB  Status:  Discontinued        2 g 200 mL/hr over 30 Minutes Intravenous Every 24 hours 09/29/24 1730 09/30/24 1051   09/29/24 1500  cefTRIAXone  (ROCEPHIN ) 1 g in sodium chloride  0.9 % 100 mL IVPB        1 g 200 mL/hr over 30 Minutes Intravenous  Once 09/29/24 1456 09/29/24 1721         Subjective: Patient seen and examined, daughter and husband at the bedside.  Patient sitting in the recliner, appears comfortable, fully alert and oriented, has no complaints at all.  Objective: Vitals:   10/01/24 0500 10/01/24 0600 10/01/24 0604 10/01/24 0734  BP:    109/72  Pulse: 68 88 (!) 49 (!) 108  Resp: 20 (!) 29 (!) 23 17  Temp:    (!) 97.4 F (36.3 C)  TempSrc:    Axillary  SpO2: 100% 96% 97% 94%  Weight:      Height:        Intake/Output Summary (Last 24 hours) at 10/01/2024  0858 Last data filed at 10/01/2024 0451 Gross per 24 hour  Intake 360 ml  Output 925 ml  Net -565 ml   Filed Weights   09/30/24 1147 10/01/24 0451  Weight: 88.6 kg 90.1 kg    Examination:  General exam: Appears calm and comfortable  Respiratory system: Clear to auscultation. Respiratory effort normal. Cardiovascular system: S1 & S2 heard, RRR. No JVD, murmurs, rubs, gallops or clicks.  +1 pitting edema bilateral lower extremity. Gastrointestinal system: Abdomen is nondistended, soft and nontender. No organomegaly or masses felt. Normal bowel sounds heard. Central nervous system: Alert and oriented. No focal neurological deficits. Extremities: Symmetric 5 x 5 power. Skin: No rashes, lesions or ulcers.  Psychiatry: Judgement and insight appear normal. Mood & affect appropriate.   Data Reviewed: I have personally reviewed following labs and imaging studies  CBC: Recent Labs  Lab 09/29/24 1303 09/29/24 1647 09/30/24 0442  WBC 10.3  --  8.0  NEUTROABS 8.4*  --   --   HGB 13.6 15.0 12.5  HCT 42.6 44.0 39.4  MCV 82.1  --  83.8  PLT 258  --  206   Basic Metabolic Panel: Recent Labs  Lab 09/29/24 1303 09/29/24 1647 09/29/24 2110 09/30/24 0442 10/01/24 0229 10/01/24 0253  NA 132* 130*  --  138 137  --   K 3.4* 4.3  --  3.6 3.6  --   CL 89*  --   --  94* 96*  --   CO2 24  --   --  28 23  --   GLUCOSE 83  --   --  76 82  --   BUN 50*  --   --  48* 51*  --   CREATININE 2.55*  --   --  2.49* 2.49*  --   CALCIUM  8.7*  --   --  8.5* 8.5*  --   MG  --   --  2.4  --   --  2.4  PHOS  --   --   --  5.8*  --   --    GFR: Estimated Creatinine Clearance: 20.7 mL/min (A) (by C-G formula based on SCr of 2.49 mg/dL (H)). Liver Function Tests: Recent Labs  Lab 09/29/24 1303 09/30/24 0442  AST 24 22  ALT 22 20  ALKPHOS 41 42  BILITOT 0.7 0.7  PROT 5.4* 5.7*  ALBUMIN 3.7 3.2*   No results for input(s): LIPASE, AMYLASE in the last 168 hours. Recent Labs  Lab  09/29/24 1304  AMMONIA 19   Coagulation Profile: No results for input(s): INR, PROTIME in the last 168 hours. Cardiac Enzymes: No results for input(s): CKTOTAL, CKMB, CKMBINDEX, TROPONINI in the last 168 hours. BNP (last 3 results) Recent Labs    08/20/24 1347  PROBNP 12,768*   HbA1C: Recent Labs    09/29/24 2110  HGBA1C 5.9*   CBG: Recent Labs  Lab 09/30/24 0003 09/30/24 0800 09/30/24 1346 10/01/24 0449  GLUCAP 73 58* 104* 74   Lipid Profile: No results for input(s): CHOL, HDL, LDLCALC, TRIG, CHOLHDL, LDLDIRECT in the last 72 hours. Thyroid  Function Tests: No results for input(s): TSH, T4TOTAL, FREET4, T3FREE, THYROIDAB in the last 72 hours. Anemia Panel: No results for input(s): VITAMINB12, FOLATE, FERRITIN, TIBC, IRON, RETICCTPCT in the last 72 hours. Sepsis Labs: Recent Labs  Lab 09/29/24 1833 09/29/24 2102 09/30/24 1250 10/01/24 0229  LATICACIDVEN 1.0 3.2* 2.2* 2.1*    Recent Results (from the past 240 hours)  Respiratory (~20 pathogens) panel by PCR     Status: None   Collection Time: 09/29/24  4:51 PM   Specimen: Nasopharyngeal Swab; Respiratory  Result Value Ref Range Status   Adenovirus NOT DETECTED NOT DETECTED Final   Coronavirus 229E NOT DETECTED NOT DETECTED Final    Comment: (NOTE) The Coronavirus on the Respiratory Panel, DOES NOT test for the novel  Coronavirus (2019 nCoV)    Coronavirus HKU1 NOT DETECTED NOT DETECTED Final   Coronavirus NL63 NOT DETECTED NOT DETECTED Final   Coronavirus OC43 NOT DETECTED NOT DETECTED Final   Metapneumovirus NOT DETECTED NOT DETECTED Final   Rhinovirus / Enterovirus NOT DETECTED NOT DETECTED Final   Influenza A NOT DETECTED NOT DETECTED Final   Influenza B NOT DETECTED NOT DETECTED Final   Parainfluenza Virus 1 NOT DETECTED NOT DETECTED Final   Parainfluenza Virus 2 NOT DETECTED NOT DETECTED Final   Parainfluenza Virus 3 NOT DETECTED NOT DETECTED Final    Parainfluenza Virus 4 NOT DETECTED NOT DETECTED Final   Respiratory Syncytial Virus NOT DETECTED NOT DETECTED Final   Bordetella pertussis NOT  DETECTED NOT DETECTED Final   Bordetella Parapertussis NOT DETECTED NOT DETECTED Final   Chlamydophila pneumoniae NOT DETECTED NOT DETECTED Final   Mycoplasma pneumoniae NOT DETECTED NOT DETECTED Final    Comment: Performed at [facility], [address], [place], [place] [address]  Blood culture (routine x 2)     Status: None (Preliminary result)   Collection Time: 09/29/24  8:56 PM   Specimen: BLOOD  Result Value Ref Range Status   Specimen Description BLOOD SITE NOT SPECIFIED  Final   Special Requests   Final    BOTTLES DRAWN AEROBIC ONLY Blood Culture results may not be optimal due to an inadequate volume of blood received in culture bottles   Culture   Final    NO GROWTH < 12 HOURS Performed at [facility], [address] [place], [place] [address]    Report Status PENDING  Incomplete  Blood culture (routine x 2)     Status: None (Preliminary result)   Collection Time: 09/29/24  9:00 PM   Specimen: BLOOD RIGHT WRIST  Result Value Ref Range Status   Specimen Description BLOOD RIGHT WRIST  Final   Special Requests   Final    BOTTLES DRAWN AEROBIC ONLY Blood Culture results may not be optimal due to an inadequate volume of blood received in culture bottles   Culture   Final    NO GROWTH < 12 HOURS Performed at [facility], [address], [place], [place] [address]    Report Status PENDING  Incomplete     Radiology Studies: MR ANGIO HEAD WO CONTRAST Result Date: 09/30/2024 CLINICAL DATA:  Follow-up examination for stroke. EXAM: MRA HEAD WITHOUT CONTRAST TECHNIQUE: Angiographic images of the Circle of Willis were acquired using MRA technique without intravenous contrast. COMPARISON:  Prior brain MRI from 09/29/2024. FINDINGS: Anterior circulation: Both internal carotid arteries are patent through the siphons without  stenosis or other abnormality. A1 segments patent bilaterally. Normal anterior communicating artery complex. Anterior cerebral arteries widely patent. No M1 stenosis or occlusion. No proximal MCA branch occlusion or high-grade stenosis. Distal MCA branches perfused and symmetric. Posterior circulation: Both vertebral arteries are widely patent to the vertebrobasilar junction without stenosis. Right vertebral artery slightly dominant. Neither PICA origin well visualized. Basilar patent without stenosis. Superior cerebral arteries patent bilaterally. Both PCAs primarily supplied via the basilar well perfused to their distal aspects. Anatomic variants: None significant. Other: No intracranial aneurysm. IMPRESSION: Normal intracranial MRA. Electronically Signed   By: Morene Hoard M.D.   On: 09/30/2024 20:18   EEG adult Result Date: 09/30/2024 Shelton Arlin KIDD, MD     09/30/2024  1:43 PM Patient Name: [name] MRN: [id number] Epilepsy Attending: Arlin KIDD Shelton Referring Physician/Provider: Tobie Mario GAILS, MD Date: 09/30/2024 Duration: 26.25 mins Patient history: 78yo F with ams. EEG to evaluate for seizure Level of alertness: Awake, asleep AEDs during EEG study: None Technical aspects: This EEG study was done with scalp electrodes positioned according to the 10-20 International system of electrode placement. Electrical activity was reviewed with band pass filter of 1-70Hz , sensitivity of 7 uV/mm, display speed of 53mm/sec with a 60Hz  notched filter applied as appropriate. EEG data were recorded continuously and digitally stored.  Video monitoring was available and reviewed as appropriate. Description: The posterior dominant rhythm consists of 8Hz  activity of moderate voltage (25-35 uV) seen predominantly in posterior head regions, symmetric and reactive to eye opening and eye closing. Sleep was characterized by vertex waves,  sleep spindles (12 to 14 Hz), maximal frontocentral region. There is  intermittent generalized 3 to 6 Hz theta-delta slowing. Hyperventilation and photic stimulation were not performed.   ABNORMALITY - Intermittent slow, generalized  IMPRESSION: This study is suggestive of generalized cerebral dysfunction (encephalopathy). No seizures or epileptiform discharges were seen throughout the recording.  Arlin MALVA Krebs   MR BRAIN WO CONTRAST Result Date: 09/29/2024 EXAM: MRI BRAIN WITHOUT CONTRAST 09/29/2024 11:19:54 PM TECHNIQUE: Multiplanar multisequence MRI of the head/brain was performed without the administration of intravenous contrast. COMPARISON: CT performed earlier the same day. CLINICAL HISTORY: Mental status change, unknown cause. FINDINGS: BRAIN AND VENTRICLES: Mild chronic microvascular ischemic disease noted involving the supratentorial cerebral white matter. Punctate focus of restricted diffusion seen involving the high posterior left frontal lobe, precentral gyrus (series 5, image 91), consistent with a tiny acute ischemic infarct. No associated hemorrhage. No mass. No midline shift. No hydrocephalus. The sella is unremarkable. Normal flow voids. ORBITS: Prior bilateral ocular lens replacement. SINUSES AND MASTOIDS: Small right mastoid effusion noted, of doubtful significance. BONES AND SOFT TISSUES: Hyperostosis frontalis interna noted. Normal marrow signal. No acute soft tissue abnormality. IMPRESSION: 1. Punctate acute ischemic nonhemorrhagic infarct in the high posterior left frontal lobe, precentral gyrus. 2. Underlying mild chronic microvascular ischemic disease. Electronically signed by: Morene Hoard MD 09/29/2024 11:54 PM EDT RP Workstation: [id number]   CT HEAD WO CONTRAST ( ) Result Date: 09/29/2024 CLINICAL DATA:  Shortness of breath altered EXAM: CT HEAD WITHOUT CONTRAST TECHNIQUE: Contiguous axial images were obtained from the base of the skull through the vertex without intravenous contrast. RADIATION DOSE REDUCTION: This exam was performed  according to the departmental dose-optimization program which includes automated exposure control, adjustment of the mA and/or kV according to patient size and/or use of iterative reconstruction technique. COMPARISON:  MRI 10/08/2023, CT brain 10/08/2023 FINDINGS: Brain: No acute territorial infarction, hemorrhage or intracranial mass. Mild chronic small vessel ischemic changes of the white matter. Stable ventricle size Vascular: No hyperdense vessels.  Carotid vascular calcification Skull: Normal. Negative for fracture or focal lesion. Sinuses/Orbits: No acute finding. Other: None IMPRESSION: 1. No CT evidence for acute intracranial abnormality. 2. Mild chronic small vessel ischemic changes of the white matter. Electronically Signed   By: Luke Bun M.D.   On: 09/29/2024 16:03   CT Chest Wo Contrast Result Date: 09/29/2024 CLINICAL DATA:  Shortness of breath EXAM: CT CHEST WITHOUT CONTRAST TECHNIQUE: Multidetector CT imaging of the chest was performed following the standard protocol without IV contrast. RADIATION DOSE REDUCTION: This exam was performed according to the departmental dose-optimization program which includes automated exposure control, adjustment of the mA and/or kV according to patient size and/or use of iterative reconstruction technique. COMPARISON:  Chest x-ray 09/29/2024, 10/08/2023 FINDINGS: Cardiovascular: Limited evaluation without intravenous contrast. Moderate severe aortic atherosclerosis. Multi vessel coronary vascular calcification. Cardiomegaly. Aortic valvular calcification. No significant pericardial effusion Mediastinum/Nodes: Patent trachea. No thyroid  mass. Few mildly enlarged lymph nodes, AP window nodes up to 12 mm. Right paratracheal nodes measuring up to 11 mm. Subcarinal node measuring up to 18 mm. Esophagus within normal limits. Lungs/Pleura: Moderate right and small moderate left pleural effusions. Underlying chronic interstitial lung disease with subpleural  reticulation and mild bronchiectasis. Smooth interstitial densities within the bilateral lungs with diffuse bilateral ground-glass disease, suspect that there is superimposed edema. Upper Abdomen: Gallstones.  No acute finding Musculoskeletal: Degenerative changes. No acute osseous abnormality. IMPRESSION: 1. Cardiomegaly with moderate right and small to moderate left pleural effusions. 2. Underlying chronic  interstitial lung disease with subpleural reticulation and mild bronchiectasis. Smooth interstitial densities within the bilateral lungs with diffuse bilateral ground-glass disease, suspect that there is edema superimposed on chronic interstitial lung disease. 3. Mildly enlarged mediastinal lymph nodes, likely reactive. 4. Gallstones. 5. Aortic atherosclerosis. Aortic Atherosclerosis (ICD10-I70.0). Electronically Signed   By: Luke Bun M.D.   On: 09/29/2024 16:00   DG Chest 1 View Result Date: 09/29/2024 EXAM: 1 VIEW(S) XRAY OF THE CHEST 09/29/2024 01:22:00 PM COMPARISON: 10/08/2023 CLINICAL HISTORY: Cough, shortness of breath. FINDINGS: LUNGS AND PLEURA: Interstitial prominence again noted throughout the lungs similar to prior study, favor scarring. Patchy airspace opacities peripherally in the left mid lung and lung base slightly increased since prior study, favor chronic scarring although acute infiltrate cannot be excluded. No pulmonary edema. No pleural effusion. No pneumothorax. HEART AND MEDIASTINUM: No acute abnormality of the cardiac and mediastinal silhouettes. Aortic atherosclerosis BONES AND SOFT TISSUES: No acute osseous abnormality. IMPRESSION: 1. Interstitial prominence throughout the lungs, similar to prior study, favoring scarring. 2. Patchy peripheral opacities in the left mid to lower lung, slightly increased from prior, favored chronic scarring; superimposed acute infiltrate cannot be excluded. Electronically signed by: Franky Crease MD 09/29/2024 02:29 PM EDT RP Workstation: [id number]     Scheduled Meds:  apixaban   5 mg Oral BID   potassium chloride   20 mEq Oral Daily   rosuvastatin   20 mg Oral Daily   sodium chloride  flush  3 mL Intravenous Q12H   Continuous Infusions:     LOS: 2 days   Fredia Skeeter, MD [facility]  10/01/2024, 8:58 AM   *Please note that this is a verbal dictation therefore any spelling or grammatical errors are due to the Dragon Medical One system interpretation.  Please page via Amion and do not message via secure chat for urgent patient care matters. Secure chat can be used for non urgent patient care matters.  How to contact the [facility] Attending or Consulting provider 7A - 7P or covering provider during after hours 7P -7A, for this patient?  Check the care team in [facility] and look for a) attending/consulting [facility] provider listed and b) the [facility] team listed. Page or secure chat 7A-7P. Log into [email or web address] and use [facility]'s universal password to access. If you do not have the password, please contact the hospital operator. Locate the [facility] provider you are looking for under [facility] and page to a number that you can be directly reached. If you still have difficulty reaching the provider, please page the [facility] (Director on Call) for the Hospitalists listed on amion for assistance.

## 2024-10-01 NOTE — Progress Notes (Addendum)
 Cardiology Progress Note  Patient ID: [name] MRN: [id number] DOB: 10-29-1946 Date of Encounter: 10/01/2024 Primary Cardiologist: Newman JINNY Lawrence, MD  Subjective   Chief Complaint: SOB  HPI: Reports she is feeling better.  Still a bit volume up.  ROS:  All other ROS reviewed and negative. Pertinent positives noted in the HPI.     Telemetry  Overnight telemetry shows A-fib heart rate 100 to 120 bpm, which I personally reviewed.    Physical Exam   Vitals:   10/01/24 0604 10/01/24 0734 10/01/24 0922 10/01/24 1031  BP:  109/72    Pulse: (!) 49 (!) 108    Resp: (!) 23 17    Temp:  (!) 97.4 F (36.3 C)    TempSrc:  Axillary    SpO2: 97% 94% 100% 99%  Weight:      Height:        Intake/Output Summary (Last 24 hours) at 10/01/2024 1053 Last data filed at 10/01/2024 9077 Gross per 24 hour  Intake 480 ml  Output 925 ml  Net -445 ml       10/01/2024    4:51 AM 09/30/2024   11:47 AM 08/30/2024    2:49 PM  Last 3 Weights  Weight (lbs) 198 lb 10.2 oz 195 lb 5.2 oz 182 lb  Weight (kg) 90.1 kg 88.6 kg 82.555 kg    Body mass index is 32.06 kg/m.  General: Well nourished, well developed, in no acute distress Head: Atraumatic, normal size  Eyes: PEERLA, EOMI  Neck: Supple, no JVD Endocrine: No thryomegaly Cardiac: Normal S1, S2; irregular rhythm,2/6 SEM Lungs: Diminished breath sounds Abd: Soft, nontender, no hepatomegaly  Ext: 2+ pitting edema Musculoskeletal: No deformities, BUE and BLE strength normal and equal Skin: Warm and dry, no rashes   Neuro: Alert and oriented to person, place, time, and situation, CNII-XII grossly intact, no focal deficits  Psych: Normal mood and affect   Cardiac Studies  TTE 06/08/2024  1. The left ventricle demonstrates global and regional hypokinesis. The  entire septum and entire inferior wall are hypokinetic. SABRA Left ventricular  ejection fraction, by estimation, is 30 to 35%. The left ventricle has  moderately decreased  function. Left   ventricular diastolic function could not be evaluated.   2. Right ventricular systolic function is low normal. The right  ventricular size is normal. There is moderately elevated pulmonary artery  systolic pressure. The estimated right ventricular systolic pressure is  47.6 mmHg.   3. Left atrial size was severely dilated.   4. Right atrial size was mildly dilated.   5. The mitral valve is degenerative. Mild to moderate mitral valve  regurgitation. No evidence of mitral stenosis.   6. Tricuspid valve regurgitation is mild to moderate.   7. The aortic valve is grossly normal. Unable to determine aortic valve  morphology due to image quality. Aortic valve regurgitation is mild. low  flow low gradient aortic stenosis (peak velocity 2.23m/s, MG , AVA  VTI 0.46cm2, SVi 12, DI 0.15  -[id number]).   8. Pulmonic valve regurgitation is moderate.   9. The inferior vena cava is normal in size with greater than 50%  respiratory variability, suggesting right atrial pressure of 3 mmHg.   LHC/RHC 09/01/2023 POST-CATH FINDINGS Angiographically mild to moderate diffuse disease in the LCx and LAD but not flow-limiting. Right dominant system. Moderate to Severe Secondary Pulmonary Hypertension With Pulmonary Venous Hypertension:  PAP-mean 69/34-47 mmHg with PCWP of 33- 35 mmHg and LVp-EDP 162/34-26 mmHg.  RAP  mean 15 mmHg, RV P-EDP 66/9-15 mmHg Ao sat 94%, PA sat 61%.   Mildly reduced Cardiac Output 4.29, cardiac index 2.09  Patient Profile  [name] is a 78 y.o. female with systolic heart failure (EF 30-35%), nonobstructive CAD, permanent A-fib, CKD 3B, aortic stenosis admitted on 09/29/2024 for confusion and UTI.  Cardiology consulted for acute systolic heart failure.  Assessment & Plan   # Encephalopathy # Sepsis # UTI? # AKI # Lactic acidosis - She is here with acute heart failure but does not appear to be in shock.  Extremities are warm.  She appears volume up to me.  I do not believe this is cardiogenic shock.   - UTI ruled out per primary team.   # Acute left frontal lobe infarct - Evaluated by neurology.  They believe her encephalopathy is secondary to AKI and UTI.  They think the stroke is incidental.  They have recommended to continue Eliquis  for now. - Can consider transition to Pradaxa as an outpatient.  For now continue Eliquis .   # Acute on chronic systolic heart failure, EF 30-35% - Still a bit volume up but could be venous insufficiency .  - Lactic acid still not cleared.  - plan to give gentle hydration and recheck echo for LVEF and filling pressures. Trend lactic acid.   - Adding back metoprolol  succinate 25 mg daily for A-fib. - Not a candidate for ACE/ARB/ARNI/MRA given CKD 3B.  We can try to get her on Imdur  hydralazine  as her blood pressure tolerates. - If lactic acid remains up and still uncertain of cardiac status, could consider PICC and Coox. Not a candidate for LVAD or transplant, but could consider short term inotropes.   # Permanent A-fib - Metoprolol  held yesterday for acute stroke.  Restart metoprolol  succinate 25 mg daily.  No need for permissive hypertension per neurology.  Restarted on Eliquis .  May need to consider transition to Pradaxa.  # Nonobstructive CAD - Not on aspirin  as she is on Eliquis .  Continue statin.  # AKI # CKD 3B - Stable.  # Moderate aortic stenosis - Echo with concerns for low-flow low gradient severe aortic stenosis.  Calcium  score 900.  Murmur inconsistent with severe AS as well.  Continue observation.     For questions or updates, please contact [facility] Please consult [email or web address] for contact info under         Signed, Darryle T. Barbaraann, MD, [facility] [place]  [facility]  10/01/2024 10:53 AM

## 2024-10-01 NOTE — Plan of Care (Signed)
 Chart check note  EEG with intermittent generalized slowing MR angiogram of the head is normal. Carotid Dopplers pending Plan as in the consultation note from yesterday. Please call back if carotid Dopplers have concerning results. Plan communicated to Dr. Vernon  A. Voncile, MD Neurology

## 2024-10-01 NOTE — Progress Notes (Signed)
 VASCULAR LAB    Carotid duplex has been performed.  See CV proc for preliminary results.   Consepcion Utt, RVT 10/01/2024, 3:54 PM

## 2024-10-01 NOTE — Progress Notes (Addendum)
 Notified provider yellow MEWS, BP 114/91 (map 99), pulse rate 50 per monitor-Afib uncontrolled. Pt asymptomatic.    10/01/24 2040  Vitals  Temp (!) 96.9 F (36.1 C)  Temp Source Oral  MEWS COLOR  MEWS Score Color Yellow  MEWS Score  MEWS Temp 0  MEWS Systolic 0  MEWS Pulse 1  MEWS RR 2  MEWS LOC 0  MEWS Score 3  Provider Notification  Provider Name/Title JAYSON Hurst MD  Date Provider Notified 10/01/24  Time Provider Notified 2047  Method of Notification Page (secure chat)  Notification Reason Other (Comment) (Yellow MEWS)  Provider response See new orders  Date of Provider Response 10/01/24  Time of Provider Response 2048

## 2024-10-01 NOTE — Progress Notes (Signed)
 Peripherally Inserted Central Catheter Placement  The IV Nurse has discussed with the patient and/or persons authorized to consent for the patient, the purpose of this procedure and the potential benefits and risks involved with this procedure.  The benefits include less needle sticks, lab draws from the catheter, and the patient may be discharged home with the catheter. Risks include, but not limited to, infection, bleeding, blood clot (thrombus formation), and puncture of an artery; nerve damage and irregular heartbeat and possibility to perform a PICC exchange if needed/ordered by physician.  Alternatives to this procedure were also discussed.  Bard Power PICC patient education guide, fact sheet on infection prevention and patient information card has been provided to patient /or left at bedside. husband at bedside and signed consent for PICC placement.   PICC Placement Documentation  PICC Triple Lumen 10/01/24 Right Brachial 37 cm 0 cm (Active)  Indication for Insertion or Continuance of Line Vasoactive infusions 10/01/24 1624  Exposed Catheter (cm) 0 cm 10/01/24 1624  Site Assessment Clean, Dry, Intact 10/01/24 1624  Lumen #1 Status Flushed;Saline locked;Blood return noted 10/01/24 1624  Lumen #2 Status Flushed;Saline locked;Blood return noted 10/01/24 1624  Lumen #3 Status Flushed;Saline locked;Blood return noted 10/01/24 1624  Dressing Type Transparent;Securing device 10/01/24 1624  Dressing Status Antimicrobial disc/dressing in place;Clean, Dry, Intact 10/01/24 1624  Line Care Connections checked and tightened 10/01/24 1624  Line Adjustment (NICU/IV Team Only) No 10/01/24 1624  Dressing Intervention New dressing;Adhesive placed at insertion site (IV team only) 10/01/24 1624  Dressing Change Due 10/08/24 10/01/24 1624       [name]  [name] 10/01/2024, 4:40 PM

## 2024-10-02 ENCOUNTER — Inpatient Hospital Stay (HOSPITAL_COMMUNITY)

## 2024-10-02 DIAGNOSIS — R400 Somnolence: Secondary | ICD-10-CM | POA: Diagnosis not present

## 2024-10-02 LAB — BASIC METABOLIC PANEL WITH GFR
Anion gap: 11 (ref 5–15)
BUN: 46 mg/dL — ABNORMAL HIGH (ref 8–23)
CO2: 26 mmol/L (ref 22–32)
Calcium: 8.4 mg/dL — ABNORMAL LOW (ref 8.9–10.3)
Chloride: 100 mmol/L (ref 98–111)
Creatinine, Ser: 2.26 mg/dL — ABNORMAL HIGH (ref 0.44–1.00)
GFR, Estimated: 22 mL/min — ABNORMAL LOW (ref >60)
Glucose, Bld: 110 mg/dL — ABNORMAL HIGH (ref 70–99)
Potassium: 3.6 mmol/L (ref 3.5–5.1)
Sodium: 137 mmol/L (ref 135–145)

## 2024-10-02 LAB — COOXEMETRY PANEL
Carboxyhemoglobin: 1.4 % (ref 0.5–1.5)
Carboxyhemoglobin: 1.8 % — ABNORMAL HIGH (ref 0.5–1.5)
Methemoglobin: 0.9 % (ref 0.0–1.5)
Methemoglobin: <0.7 % (ref 0.0–1.5)
O2 Saturation: 62.8 %
O2 Saturation: 71.9 %
Total hemoglobin: 13.4 g/dL (ref 12.0–16.0)
Total hemoglobin: 12.7 g/dL (ref 12.0–16.0)

## 2024-10-02 LAB — PHOSPHORUS: Phosphorus: 3.6 mg/dL (ref 2.5–4.6)

## 2024-10-02 LAB — GLUCOSE, CAPILLARY: Glucose-Capillary: 101 mg/dL — ABNORMAL HIGH (ref 70–99)

## 2024-10-02 LAB — LACTIC ACID, PLASMA: Lactic Acid, Venous: 1.4 mmol/L (ref 0.5–1.9)

## 2024-10-02 MED ORDER — GABAPENTIN 100 MG PO CAPS
100.0000 mg | ORAL_CAPSULE | Freq: Two times a day (BID) | ORAL | Status: DC
  Administered 2024-10-02 – 2024-10-09 (×14): 100 mg via ORAL
  Filled 2024-10-02 (×14): qty 1

## 2024-10-02 NOTE — Progress Notes (Signed)
 [facility] Progress Note  Patient: [name]    FMW:[id number]  DOA: 09/29/2024     Date of Service: the patient was seen and examined on 10/02/2024  Chief Complaint  Patient presents with   Shortness of Breath   Fall   Leg Swelling   Brief hospital course: This is a pleasant 78 year old female followed by Dr. Elmira with a history of nonischemic cardiomyopathy, pericardial effusion status post pericardiocentesis (01/2021) , HFrEF, nonobstructive coronary disease and permanent A-fib on Eliquis  along with aortic stenosis and stage IIIb chronic kidney disease. She was recently seen for preoperative clearance for cataract surgery and had reported some dizziness and symptoms suggesting possible overdiuresis. Her diuretics were reduced. She also has a history of hyponatremia. LVEF was 30 to 35%. She now presents over the past 5 days with increasing agitation, lower extremity edema, shortness of breath and visual and auditory hallucinations. She had a similar episode in November 2024. Her PCP diagnosed her with a UTI few days ago and she was started on antibiotics but not improving. In the emergency department she was noted to be in A-fib with heart rate of 90, creatinine was elevated 2.55 above her baseline of 1.5. BNP was elevated at 762 and lactate was 2.2. Chest x-ray showed scarring and possible infiltrate. CT chest showed moderate right and small left pleural effusions with underlying chronic interstitial lung disease. Urinalysis showed nitrite and leukoesterase negative but positive for rare bacteria and she was continued on IV ceftriaxone .    Assessment and Plan:   # Acute metabolic encephalopathy # Acute ischemic stroke:  Due to encephalopathy of unknown source, patient ended up having CT head which was unremarkable followed by MRI brain which showed Punctate acute ischemic nonhemorrhagic infarct in the high posterior left frontal lobe, precentral gyrus.   Neurology consulted.   Eliquis  resumed per their recommendation.  Lipid panel last month: LDL 49. On Crestor  20 mg at home which was resumed.  Hemoglobin A1c 6.0. Neurology ordered carotid Doppler ultrasound as well as MRA brain and EEG, all of them unremarkable.  Neurology signed off, recommending continuing Eliquis .  Needs to be seen by PT OT.   # Acute on chronic systolic CHF exacerbation TTE shows LVEF 20-25% Lactic acidosis secondary to severe CHF as per cardiology. 10/31 started dobutamine gtt as per cards Lasix  was held for now Lactic acid 4.2>>1.4, trended back to normal Trend lactic acid level Monitor renal function and urine output Follow cardiology for further recommendation Carboxyhemoglobin 1.8, elevated   Asymptomatic bacteriuria: Patient afebrile, no leukocytosis, UA not impressive for UTI.  I do not think this is true UTI, discontinued antibiotics.   AKI on CKD stage IIIb: Baseline creatinine 1.5 but presented with 2.5.  Cardiology believes this is cardiorenal syndrome.  patient was on Bactrim DS for UTI given by PCP, which could also cause AKI.  S/p Lasix  given by cardiology  sCr 2.26 Check bladder scan to rule out urinary retention Follow renal sonogram    Hypoglycemia: No prior history of diabetes, blood sugar yesterday morning was 58, hemoglobin A1c 6.0.  Likely due to poor p.o. intake.    Chronic A-fib/prolonged QTc:  Continue to monitor on telemetry PTA medication include Toprol -XL which is on hold.   Will defer to cardiology for that.   She is asymptomatic.   Acquired hypothyroidism: Continue Synthroid .   Body mass index is 30.93 kg/m.  Interventions:   Diet: Heart healthy diet DVT Prophylaxis: Eliquis   Advance goals of care discussion:  DNR-limited  Family Communication: family was not present at bedside, at the time of interview.  The pt provided permission to discuss medical plan with the family. Opportunity was given to ask question and all questions were answered  satisfactorily.   Disposition:  Pt is from home, admitted with acute CVA and CHF exacerbation, still has CHF, on dobutamine IV infusion, which precludes a safe discharge. Discharge to home, when stable, may need few days to improve. Patient will need PT and OT eval before discharge  Subjective: No significant events overnight.  Patient was sitting comfortably in the recliner.  Patient stated that she feels tired, no any other complaints.  AO x 2, not aware of place  Physical Exam: General: NAD, lying comfortably Appear in no distress, affect appropriate Eyes: PERRLA ENT: Oral Mucosa Clear, moist  Neck: no JVD,  Cardiovascular: S1 and S2 Present, no Murmur,  Respiratory: good respiratory effort, Bilateral Air entry equal and Decreased, mild crackles, no wheezes Abdomen: Bowel Sound present, Soft and no tenderness,  Skin: no rashes Extremities: 2+ pedal edema, no calf tenderness Neurologic: without any new focal findings Gait not checked due to patient safety concerns  Vitals:   10/02/24 0345 10/02/24 0500 10/02/24 0731 10/02/24 1236  BP: 114/83  103/75 91/63  Pulse: (!) 117  79 91  Resp: (!) 24  18 (!) 21  Temp: 97.6 F (36.4 C)   (!) 97.5 F (36.4 C)  TempSrc: Axillary   Oral  SpO2: 94%  95%   Weight:  86.9 kg    Height:        Intake/Output Summary (Last 24 hours) at 10/02/2024 1353 Last data filed at 10/02/2024 0844 Gross per 24 hour  Intake 383.35 ml  Output 100 ml  Net 283.35 ml   Filed Weights   09/30/24 1147 10/01/24 0451 10/02/24 0500  Weight: 88.6 kg 90.1 kg 86.9 kg    Data Reviewed: I have personally reviewed and interpreted daily labs, tele strips, imagings as discussed above. I reviewed all nursing notes, pharmacy notes, vitals, pertinent old records I have discussed plan of care as described above with RN and patient/family.  CBC: Recent Labs  Lab 09/29/24 1303 09/29/24 1647 09/30/24 0442  WBC 10.3  --  8.0  NEUTROABS 8.4*  --   --   HGB 13.6  15.0 12.5  HCT 42.6 44.0 39.4  MCV 82.1  --  83.8  PLT 258  --  206   Basic Metabolic Panel: Recent Labs  Lab 09/29/24 1303 09/29/24 1647 09/29/24 2110 09/30/24 0442 10/01/24 0229 10/01/24 0253 10/02/24 0245  NA 132* 130*  --  138 137  --  137  K 3.4* 4.3  --  3.6 3.6  --  3.6  CL 89*  --   --  94* 96*  --  100  CO2 24  --   --  28 23  --  26  GLUCOSE 83  --   --  76 82  --  110*  BUN 50*  --   --  48* 51*  --  46*  CREATININE 2.55*  --   --  2.49* 2.49*  --  2.26*  CALCIUM  8.7*  --   --  8.5* 8.5*  --  8.4*  MG  --   --  2.4  --   --  2.4  --   PHOS  --   --   --  5.8*  --   --  3.6  Studies: VAS US  CAROTID Result Date: 10/02/2024 Carotid Arterial Duplex Study Patient Name:  MIKEALA GIRDLER  Date of Exam:   10/01/2024 Medical Rec #: [id number]       Accession #:    [id number] Date of Birth: 1946-03-19        Patient Gender: F Patient Age:   53 years Exam Location:  [facility] Procedure:      VAS US  CAROTID Referring Phys: ELIGIO LAV --------------------------------------------------------------------------------  Indications:       CVA and Lethargy and disorientation. Risk Factors:      Hypertension, hyperlipidemia. Other Factors:     HFrEF, dyspnea, severe pulmonary hypertension. Limitations        Today's exam was limited due to the patient's respiratory                    variation, tachycardia, arrythmia, and vessel tortuosity. Comparison Study:  No prior carotid duplex on file Performing Technologist: Alberta Lis RVS  Examination Guidelines: A complete evaluation includes B-mode imaging, spectral Doppler, color Doppler, and power Doppler as needed of all accessible portions of each vessel. Bilateral testing is considered an integral part of a complete examination. Limited examinations for reoccurring indications may be performed as noted.  Right Carotid Findings: +----------+--------+--------+--------+------------------+------------------+           PSV cm/sEDV  cm/sStenosisPlaque DescriptionComments           +----------+--------+--------+--------+------------------+------------------+ CCA Prox  79      14                                intimal thickening +----------+--------+--------+--------+------------------+------------------+ CCA Distal32      13                                intimal thickening +----------+--------+--------+--------+------------------+------------------+ ICA Prox  80      27              calcific          tortuous           +----------+--------+--------+--------+------------------+------------------+ ICA Mid   77      32                                tortuous           +----------+--------+--------+--------+------------------+------------------+ ICA Distal91      39                                tortuous           +----------+--------+--------+--------+------------------+------------------+ ECA       70      15                                                   +----------+--------+--------+--------+------------------+------------------+ +----------+--------+-------+--------+-------------------+           PSV cm/sEDV cmsDescribeArm Pressure (mmHG) +----------+--------+-------+--------+-------------------+ [id number]                                         +----------+--------+-------+--------+-------------------+ +---------+--------+--+--------+--+  VertebralPSV cm/s50EDV cm/s15 +---------+--------+--+--------+--+  Left Carotid Findings: +----------+--------+--------+--------+------------------+------------------+           PSV cm/sEDV cm/sStenosisPlaque DescriptionComments           +----------+--------+--------+--------+------------------+------------------+ CCA Prox  50      19                                intimal thickening +----------+--------+--------+--------+------------------+------------------+ CCA Distal63      17              heterogenous                          +----------+--------+--------+--------+------------------+------------------+ ICA Prox  102     38              heterogenous                         +----------+--------+--------+--------+------------------+------------------+ ICA Mid   98      47                                                   +----------+--------+--------+--------+------------------+------------------+ ICA Distal75      33                                                   +----------+--------+--------+--------+------------------+------------------+ ECA       48      10                                                   +----------+--------+--------+--------+------------------+------------------+ +----------+--------+--------+--------+-------------------+           PSV cm/sEDV cm/sDescribeArm Pressure (mmHG) +----------+--------+--------+--------+-------------------+ Dlarojcpjw20                                          +----------+--------+--------+--------+-------------------+ +---------+--------+--+--------+--+ VertebralPSV cm/s55EDV cm/s22 +---------+--------+--+--------+--+   Summary: Right Carotid: Velocities in the right ICA are consistent with a 1-39% stenosis. Left Carotid: Velocities in the left ICA are consistent with a 1-39% stenosis. Vertebrals:  Bilateral vertebral arteries demonstrate antegrade flow. Subclavians: Normal flow hemodynamics were seen in bilateral subclavian              arteries. *See table(s) above for measurements and observations.  Electronically signed by Eather Popp MD on 10/02/2024 at 11:29:06 AM.    Final    DG Chest Port 1 View Result Date: 10/01/2024 CLINICAL DATA:  PICC line EXAM: PORTABLE CHEST 1 VIEW COMPARISON:  09/29/2024 chest x-ray and CT, chest x-ray 10/08/2023 FINDINGS: Cardiomegaly with pleural effusions. Increased vascular congestion and increased heterogeneous bilateral pulmonary infiltrates or edema on underlying chronic disease.  Interim right upper extremity central venous catheter with the tip projecting over the superior right atrial region, about 15 mm distal to the expected location of cavoatrial junction. IMPRESSION: 1. Interim right upper extremity central venous catheter with the tip  projecting over the superior right atrial region, about 15 mm distal to the expected location of cavoatrial junction. 2. Cardiomegaly with pleural effusions. Increased vascular congestion and increased heterogeneous bilateral pulmonary infiltrates or edema on underlying chronic disease. Electronically Signed   By: Luke Bun M.D.   On: 10/01/2024 17:26   US  EKG SITE RITE Result Date: 10/01/2024 If Site Rite image not attached, placement could not be confirmed due to current cardiac rhythm.   Scheduled Meds:  apixaban   5 mg Oral BID   Chlorhexidine  Gluconate Cloth  6 each Topical Daily   levothyroxine   125 mcg Oral QAC breakfast   pantoprazole   40 mg Oral Daily   rosuvastatin   20 mg Oral Daily   sodium chloride  flush  10-40 mL Intracatheter Q12H   sodium chloride  flush  3 mL Intravenous Q12H   Continuous Infusions:  DOBUTamine 2.5 mcg/kg/min (10/02/24 0000)   PRN Meds: acetaminophen  **OR** acetaminophen , sodium chloride  flush  Time spent: 35 minutes  Author: ELVAN SOR. MD [facility] 10/02/2024 1:53 PM  To reach On-call, see care teams to locate the attending and reach out to them via [email or web address]. If 7PM-7AM, please contact night-coverage If you still have difficulty reaching the attending provider, please page the [facility] (Director on Call) for [facility] on amion for assistance.

## 2024-10-02 NOTE — Plan of Care (Signed)
   Problem: Clinical Measurements: Goal: Cardiovascular complication will be avoided Outcome: Progressing   Problem: Activity: Goal: Risk for activity intolerance will decrease Outcome: Progressing   Problem: Safety: Goal: Ability to remain free from injury will improve Outcome: Progressing

## 2024-10-02 NOTE — Progress Notes (Signed)
  Progress Note  Patient Name: [name] Date of Encounter: 10/02/2024 [facility] Cardiologist: Patricia JINNY Lawrence, MD   Interval Summary   No acute complaints.  Vital Signs Vitals:   10/01/24 2358 10/02/24 0345 10/02/24 0500 10/02/24 0731  BP: 91/65 114/83  103/75  Pulse: (!) 114 (!) 117  79  Resp: 19 (!) 24  18  Temp: 97.7 F (36.5 C) 97.6 F (36.4 C)    TempSrc: Axillary Axillary    SpO2: 97% 94%  95%  Weight:   86.9 kg   Height:        Intake/Output Summary (Last 24 hours) at 10/02/2024 1049 Last data filed at 10/02/2024 0844 Gross per 24 hour  Intake 503.35 ml  Output 100 ml  Net 403.35 ml      10/02/2024    5:00 AM 10/01/2024    4:51 AM 09/30/2024   11:47 AM  Last 3 Weights  Weight (lbs) 191 lb 9.6 oz 198 lb 10.2 oz 195 lb 5.2 oz  Weight (kg) 86.909 kg 90.1 kg 88.6 kg      Telemetry/ECG  Atrial fibrillation- Personally Reviewed  Physical Exam  GEN: No acute distress.   Neck: + JVD Cardiac: Irregular, tachycardic, no murmurs, rubs, or gallops.  Respiratory: Clear to auscultation bilaterally. GI: Soft, nontender, non-distended  MS: 1+ edema  Assessment & Plan  1.  Acute on chronic systolic heart failure: Ejection fraction 30 to 35%.  Not a good candidate for ACE inhibitor or ARB due to CKD stage IIIb.  Not a candidate for advanced heart failure therapies.  She was started on dopamine yesterday due to worsening heart failure.  Coags and lactate are pending.  [name] await results from labs prior to reinitiation of diuresis.  2.  Permanent atrial fibrillation: Permissive hypertension per neurology due to recent stroke.  Continue Eliquis .  May need to transition to Pradaxa.  3.  Nonobstructive coronary artery disease: On aspirin  due to being on Eliquis .  Continue statin  4.  Moderate aortic stenosis: Echo with concern for low gradient severe aortic stenosis.  Continue observation for now.  5.  Encephalopathy 6.  Sepsis 7.  Questionable  UTI 8.  Acute renal failure 9.  Lactic acidosis    For questions or updates, please contact [facility] Please consult [email or web address] for contact info under         Signed, Thorne Wirz Gladis Norton, MD

## 2024-10-03 DIAGNOSIS — R400 Somnolence: Secondary | ICD-10-CM | POA: Diagnosis not present

## 2024-10-03 LAB — CBC
HCT: 36.5 % (ref 36.0–46.0)
Hemoglobin: 11.4 g/dL — ABNORMAL LOW (ref 12.0–15.0)
MCH: 26.6 pg (ref 26.0–34.0)
MCHC: 31.2 g/dL (ref 30.0–36.0)
MCV: 85.3 fL (ref 80.0–100.0)
Platelets: 178 K/uL (ref 150–400)
RBC: 4.28 MIL/uL (ref 3.87–5.11)
RDW: 17.4 % — ABNORMAL HIGH (ref 11.5–15.5)
WBC: 7.5 K/uL (ref 4.0–10.5)
nRBC: 0 % (ref 0.0–0.2)

## 2024-10-03 LAB — BASIC METABOLIC PANEL WITH GFR
Anion gap: 11 (ref 5–15)
BUN: 32 mg/dL — ABNORMAL HIGH (ref 8–23)
CO2: 26 mmol/L (ref 22–32)
Calcium: 8 mg/dL — ABNORMAL LOW (ref 8.9–10.3)
Chloride: 101 mmol/L (ref 98–111)
Creatinine, Ser: 1.65 mg/dL — ABNORMAL HIGH (ref 0.44–1.00)
GFR, Estimated: 31 mL/min — ABNORMAL LOW (ref >60)
Glucose, Bld: 115 mg/dL — ABNORMAL HIGH (ref 70–99)
Potassium: 3.5 mmol/L (ref 3.5–5.1)
Sodium: 138 mmol/L (ref 135–145)

## 2024-10-03 LAB — PHOSPHORUS: Phosphorus: 2.8 mg/dL (ref 2.5–4.6)

## 2024-10-03 LAB — LACTIC ACID, PLASMA: Lactic Acid, Venous: 1.1 mmol/L (ref 0.5–1.9)

## 2024-10-03 LAB — MAGNESIUM: Magnesium: 2.2 mg/dL (ref 1.7–2.4)

## 2024-10-03 MED ORDER — FUROSEMIDE 10 MG/ML IJ SOLN
40.0000 mg | Freq: Two times a day (BID) | INTRAMUSCULAR | Status: DC
  Administered 2024-10-03 – 2024-10-08 (×10): 40 mg via INTRAVENOUS
  Filled 2024-10-03 (×11): qty 4

## 2024-10-03 NOTE — Plan of Care (Signed)

## 2024-10-03 NOTE — Progress Notes (Signed)
  Progress Note  Patient Name: DERIANA VANDERHOEF Date of Encounter: 10/03/2024 [facility] Cardiologist: Newman JINNY Lawrence, MD   Interval Summary   Feeling well.  No acute complaints.  Creatinine has improved on dobutamine.  Lactate has cleared.  Vital Signs Vitals:   10/02/24 2000 10/02/24 2335 10/03/24 0336 10/03/24 0721  BP: 123/70 (!) 113/90 130/76 116/83  Pulse: (!) 116 (!) 118 (!) 45 82  Resp: 19 13 20 17   Temp: (!) 97.4 F (36.3 C) 97.6 F (36.4 C) (!) 97.5 F (36.4 C)   TempSrc: Axillary Axillary Axillary   SpO2: 96% 98% 97% 95%  Weight:      Height:        Intake/Output Summary (Last 24 hours) at 10/03/2024 0829 Last data filed at 10/02/2024 2000 Gross per 24 hour  Intake 360 ml  Output --  Net 360 ml      10/02/2024    5:00 AM 10/01/2024    4:51 AM 09/30/2024   11:47 AM  Last 3 Weights  Weight (lbs) 191 lb 9.6 oz 198 lb 10.2 oz 195 lb 5.2 oz  Weight (kg) 86.909 kg 90.1 kg 88.6 kg      Telemetry/ECG  Atrial fibrillation- Personally Reviewed  Physical Exam  GEN: No acute distress.   Neck: + JVD Cardiac: Tachycardic, irregular , no murmurs, rubs, or gallops.  Respiratory: Clear to auscultation bilaterally. GI: Soft, nontender, non-distended  MS: 1+ edema  Assessment & Plan  1.  Acute on chronic systolic heart failure: Ejection fraction 30 to 35%.  Not a good candidate for advanced therapies.  She was started on dobutamine due to worsening heart failure.  Creatinine and lactate have continuously improved.  She does have some lower extremity edema and JVD.  Leonell Lobdell start Lasix  40 mg twice daily.  2.  Permanent atrial fibrillation: Heart rate is rapid on current dose of dobutamine.  For now, we Lashara Urey continue with dobutamine as we are giving Lasix .  3.  Nonobstructive coronary artery disease: On Eliquis .  Continue statin.  4.  Moderate aortic stenosis: Concern for low gradient severe aortic stenosis.  Continue observation for now.   For questions or  updates, please contact [facility] Please consult [email or web address] for contact info under         Signed, Sabra Sessler Gladis Norton, MD

## 2024-10-03 NOTE — Progress Notes (Signed)
 [facility] Progress Note  Patient: [name]    FMW:[id number]  DOA: 09/29/2024     Date of Service: the patient was seen and examined on 10/03/2024  Chief Complaint  Patient presents with   Shortness of Breath   Fall   Leg Swelling   Brief hospital course: This is a pleasant 78 year old female followed by Dr. Elmira with a history of nonischemic cardiomyopathy, pericardial effusion status post pericardiocentesis (01/2021) , HFrEF, nonobstructive coronary disease and permanent A-fib on Eliquis  along with aortic stenosis and stage IIIb chronic kidney disease. She was recently seen for preoperative clearance for cataract surgery and had reported some dizziness and symptoms suggesting possible overdiuresis. Her diuretics were reduced. She also has a history of hyponatremia. LVEF was 30 to 35%. She now presents over the past 5 days with increasing agitation, lower extremity edema, shortness of breath and visual and auditory hallucinations. She had a similar episode in November 2024. Her PCP diagnosed her with a UTI few days ago and she was started on antibiotics but not improving. In the emergency department she was noted to be in A-fib with heart rate of 90, creatinine was elevated 2.55 above her baseline of 1.5. BNP was elevated at 762 and lactate was 2.2. Chest x-ray showed scarring and possible infiltrate. CT chest showed moderate right and small left pleural effusions with underlying chronic interstitial lung disease. Urinalysis showed nitrite and leukoesterase negative but positive for rare bacteria and she was continued on IV ceftriaxone .    Assessment and Plan:   # Acute metabolic encephalopathy # Acute ischemic stroke:  Due to encephalopathy of unknown source, patient ended up having CT head which was unremarkable followed by MRI brain which showed Punctate acute ischemic nonhemorrhagic infarct in the high posterior left frontal lobe, precentral gyrus.   Neurology consulted.   Eliquis  resumed per their recommendation.  Lipid panel last month: LDL 49. On Crestor  20 mg at home which was resumed.  Hemoglobin A1c 6.0. Neurology ordered carotid Doppler ultrasound as well as MRA brain and EEG, all of them unremarkable.  Neurology signed off, recommending continuing Eliquis .  Needs to be seen by PT OT.   # Acute on chronic systolic CHF exacerbation TTE shows LVEF 20-25% Lactic acidosis secondary to severe CHF as per cardiology. 10/31 started dobutamine gtt as per cards Lasix  was held but resumed on 11/2  Lactic acid 4.2>>1.4, trended back to normal Monitor renal function and urine output Carboxyhemoglobin 1.8, elevated 11/2 started Lasix  40 mg IV twice daily Follow cardiology for further recommendation   Asymptomatic bacteriuria: Patient afebrile, no leukocytosis, UA not impressive for UTI.  I do not think this is true UTI, discontinued antibiotics.   AKI on CKD stage IIIb: Baseline creatinine 1.5 but presented with 2.5.  Cardiology believes this is cardiorenal syndrome.  patient was on Bactrim DS for UTI given by PCP, which could also cause AKI.  S/p Lasix  given by cardiology  sCr 2.26>> 1.65 US  renal: No urinary tract dilation or shadowing calculi. Diffuse cortical thinning of the left kidney.     Hypoglycemia: No prior history of diabetes, blood sugar yesterday morning was 58, hemoglobin A1c 6.0.  Likely due to poor p.o. intake.    Chronic A-fib/prolonged QTc:  Continue to monitor on telemetry PTA medication include Toprol -XL which is on hold.   Will defer to cardiology for that.   She is asymptomatic.   Acquired hypothyroidism: Continue Synthroid .   Body mass index is 30.93 kg/m.  Interventions:  Diet: Heart healthy diet DVT Prophylaxis: Eliquis   Advance goals of care discussion: DNR-limited  Family Communication: family was not present at bedside, at the time of interview.  The pt provided permission to discuss medical plan with the family.  Opportunity was given to ask question and all questions were answered satisfactorily.   Disposition:  Pt is from home, admitted with acute CVA and CHF exacerbation, still has CHF, on dobutamine IV infusion, which precludes a safe discharge. Discharge to home, when stable, may need few days to improve. Patient will need PT and OT eval before discharge  Subjective: No significant events overnight.  Patient was laying comfortably on the bed.  Denies any worsening of shortness of breath, no chest pain or palpitations.  Overall she feels improvement, more awake and alert today.  Patient is motivated to ambulate, advised to call RN so that she can get help for walking.   Physical Exam: General: NAD, lying comfortably Appear in no distress, affect appropriate Eyes: PERRLA ENT: Oral Mucosa Clear, moist  Neck: no JVD,  Cardiovascular: S1 and S2 Present, no Murmur,  Respiratory: good respiratory effort, Bilateral Air entry equal and Decreased, mild crackles, no wheezes Abdomen: Bowel Sound present, Soft and no tenderness,  Skin: no rashes Extremities: 2+ pedal edema, no calf tenderness Neurologic: without any new focal findings Gait not checked due to patient safety concerns  Vitals:   10/02/24 2335 10/03/24 0336 10/03/24 0721 10/03/24 1145  BP: (!) 113/90 130/76 116/83 96/85  Pulse: (!) 118 (!) 45 82 100  Resp: 13 20 17 19   Temp: 97.6 F (36.4 C) (!) 97.5 F (36.4 C)  (!) 97 F (36.1 C)  TempSrc: Axillary Axillary  Axillary  SpO2: 98% 97% 95% 99%  Weight:      Height:        Intake/Output Summary (Last 24 hours) at 10/03/2024 1505 Last data filed at 10/03/2024 1300 Gross per 24 hour  Intake 533 ml  Output --  Net 533 ml   Filed Weights   09/30/24 1147 10/01/24 0451 10/02/24 0500  Weight: 88.6 kg 90.1 kg 86.9 kg    Data Reviewed: I have personally reviewed and interpreted daily labs, tele strips, imagings as discussed above. I reviewed all nursing notes, pharmacy notes,  vitals, pertinent old records I have discussed plan of care as described above with RN and patient/family.  CBC: Recent Labs  Lab 09/29/24 1303 09/29/24 1647 09/30/24 0442 10/03/24 0640  WBC 10.3  --  8.0 7.5  NEUTROABS 8.4*  --   --   --   HGB 13.6 15.0 12.5 11.4*  HCT 42.6 44.0 39.4 36.5  MCV 82.1  --  83.8 85.3  PLT 258  --  206 178   Basic Metabolic Panel: Recent Labs  Lab 09/29/24 1303 09/29/24 1647 09/29/24 2110 09/30/24 0442 10/01/24 0229 10/01/24 0253 10/02/24 0245 10/03/24 0640  NA 132* 130*  --  138 137  --  137 138  K 3.4* 4.3  --  3.6 3.6  --  3.6 3.5  CL 89*  --   --  94* 96*  --  100 101  CO2 24  --   --  28 23  --  26 26  GLUCOSE 83  --   --  76 82  --  110* 115*  BUN 50*  --   --  48* 51*  --  46* 32*  CREATININE 2.55*  --   --  2.49* 2.49*  --  2.26*  1.65*  CALCIUM  8.7*  --   --  8.5* 8.5*  --  8.4* 8.0*  MG  --   --  2.4  --   --  2.4  --  2.2  PHOS  --   --   --  5.8*  --   --  3.6 2.8    Studies: US  RENAL Result Date: 10/02/2024 CLINICAL DATA:  Acute kidney injury EXAM: RENAL / URINARY TRACT ULTRASOUND COMPLETE COMPARISON:  Renal ultrasound examination dated 06/20/2021 FINDINGS: Right Kidney: Length = 9.9 cm Normal parenchymal echogenicity with preserved corticomedullary differentiation. No urinary tract dilation or shadowing calculi. The ureter is not seen. Left Kidney: Length = 8.3 cm Diffuse cortical thinning. Normal parenchymal echogenicity with preserved corticomedullary differentiation. No urinary tract dilation or shadowing calculi. The ureter is not seen. Bladder: Appears normal for degree of bladder distention. Prevoid bladder volume measures 138 mL. Other: Partially imaged bilateral pleural effusions. IMPRESSION: 1. No urinary tract dilation or shadowing calculi. 2. Diffuse cortical thinning of the left kidney. 3. Partially imaged bilateral pleural effusions. Electronically Signed   By: Limin  Xu M.D.   On: 10/02/2024 15:23    Scheduled  Meds:  apixaban   5 mg Oral BID   Chlorhexidine  Gluconate Cloth  6 each Topical Daily   furosemide   40 mg Intravenous BID   gabapentin  100 mg Oral BID   levothyroxine   125 mcg Oral QAC breakfast   pantoprazole   40 mg Oral Daily   rosuvastatin   20 mg Oral Daily   sodium chloride  flush  10-40 mL Intracatheter Q12H   sodium chloride  flush  3 mL Intravenous Q12H   Continuous Infusions:  DOBUTamine 2.5 mcg/kg/min (10/02/24 0000)   PRN Meds: acetaminophen  **OR** acetaminophen , sodium chloride  flush  Time spent: 35 minutes  Author: ELVAN SOR. MD [facility] 10/03/2024 3:05 PM  To reach On-call, see care teams to locate the attending and reach out to them via [email or web address]. If 7PM-7AM, please contact night-coverage If you still have difficulty reaching the attending provider, please page the [facility] (Director on Call) for [facility] on amion for assistance.

## 2024-10-04 DIAGNOSIS — I5043 Acute on chronic combined systolic (congestive) and diastolic (congestive) heart failure: Secondary | ICD-10-CM | POA: Diagnosis not present

## 2024-10-04 DIAGNOSIS — N179 Acute kidney failure, unspecified: Secondary | ICD-10-CM | POA: Diagnosis not present

## 2024-10-04 DIAGNOSIS — Z7189 Other specified counseling: Secondary | ICD-10-CM

## 2024-10-04 DIAGNOSIS — R400 Somnolence: Secondary | ICD-10-CM | POA: Diagnosis not present

## 2024-10-04 DIAGNOSIS — E872 Acidosis, unspecified: Secondary | ICD-10-CM | POA: Diagnosis not present

## 2024-10-04 DIAGNOSIS — R570 Cardiogenic shock: Secondary | ICD-10-CM | POA: Diagnosis not present

## 2024-10-04 LAB — CBC
HCT: 40.3 % (ref 36.0–46.0)
Hemoglobin: 12.2 g/dL (ref 12.0–15.0)
MCH: 26.1 pg (ref 26.0–34.0)
MCHC: 30.3 g/dL (ref 30.0–36.0)
MCV: 86.3 fL (ref 80.0–100.0)
Platelets: 197 K/uL (ref 150–400)
RBC: 4.67 MIL/uL (ref 3.87–5.11)
RDW: 18 % — ABNORMAL HIGH (ref 11.5–15.5)
WBC: 7.8 K/uL (ref 4.0–10.5)
nRBC: 0 % (ref 0.0–0.2)

## 2024-10-04 LAB — MAGNESIUM: Magnesium: 2.1 mg/dL (ref 1.7–2.4)

## 2024-10-04 LAB — CULTURE, BLOOD (ROUTINE X 2)
Culture: NO GROWTH
Culture: NO GROWTH

## 2024-10-04 LAB — BASIC METABOLIC PANEL WITH GFR
Anion gap: 11 (ref 5–15)
BUN: 33 mg/dL — ABNORMAL HIGH (ref 8–23)
CO2: 26 mmol/L (ref 22–32)
Calcium: 8.5 mg/dL — ABNORMAL LOW (ref 8.9–10.3)
Chloride: 101 mmol/L (ref 98–111)
Creatinine, Ser: 1.87 mg/dL — ABNORMAL HIGH (ref 0.44–1.00)
GFR, Estimated: 27 mL/min — ABNORMAL LOW (ref >60)
Glucose, Bld: 105 mg/dL — ABNORMAL HIGH (ref 70–99)
Potassium: 4.1 mmol/L (ref 3.5–5.1)
Sodium: 138 mmol/L (ref 135–145)

## 2024-10-04 LAB — LACTIC ACID, PLASMA: Lactic Acid, Venous: 0.8 mmol/L (ref 0.5–1.9)

## 2024-10-04 LAB — TSH: TSH: 6.389 u[IU]/mL — ABNORMAL HIGH (ref 0.350–4.500)

## 2024-10-04 LAB — PHOSPHORUS: Phosphorus: 3 mg/dL (ref 2.5–4.6)

## 2024-10-04 MED ORDER — HYDRALAZINE HCL 20 MG/ML IJ SOLN: 10.0000 mg | INTRAMUSCULAR | Status: DC | PRN

## 2024-10-04 MED ORDER — IPRATROPIUM-ALBUTEROL 0.5-2.5 (3) MG/3ML IN SOLN
3.0000 mL | RESPIRATORY_TRACT | Status: DC | PRN
Start: 2024-10-04 — End: 2024-10-09

## 2024-10-04 MED ORDER — AMIODARONE HCL IN DEXTROSE 360-4.14 MG/200ML-% IV SOLN
60.0000 mg/h | INTRAVENOUS | Status: AC
  Administered 2024-10-04: 60 mg/h via INTRAVENOUS

## 2024-10-04 MED ORDER — AMIODARONE HCL IN DEXTROSE 360-4.14 MG/200ML-% IV SOLN
30.0000 mg/h | INTRAVENOUS | Status: DC
  Administered 2024-10-04 – 2024-10-07 (×6): 30 mg/h via INTRAVENOUS
  Filled 2024-10-04 (×7): qty 200

## 2024-10-04 MED ORDER — GUAIFENESIN 100 MG/5ML PO LIQD
5.0000 mL | ORAL | Status: DC | PRN
Start: 2024-10-04 — End: 2024-10-09

## 2024-10-04 MED ORDER — METOPROLOL TARTRATE 5 MG/5ML IV SOLN
5.0000 mg | INTRAVENOUS | Status: DC | PRN
  Administered 2024-10-04: 5 mg via INTRAVENOUS
  Filled 2024-10-04: qty 5

## 2024-10-04 MED ORDER — GLUCAGON HCL RDNA (DIAGNOSTIC) 1 MG IJ SOLR: 1.0000 mg | INTRAMUSCULAR | Status: DC | PRN

## 2024-10-04 NOTE — Progress Notes (Signed)
 [facility] night cross cover note:   I was notified by the patient's RN that the patient remains in atrial fibrillation with RVR, with heart rate at times sustaining in the 150s to 160s, while on dobutamine drip for inotropic support in the setting of acute on chronic systolic heart failure.  Other vital signs have remained stable, including afebrile, systolic blood pressures in the low 100s to 1 teens, respiratory rate 15-19, and oxygen saturation 98% on room air.   Patient is asymptomatic at this time, including no associated chest pain or shortness of breath.  RN has also updated on-call cardiology fellow with recommendation to continue dobutamine, as above, and monitor for development of hypotension.     Eva Pore, DO Hospitalist

## 2024-10-04 NOTE — Progress Notes (Signed)
 PROGRESS NOTE    [name]  FMW:[id number] DOB: Jan 02, 1946 DOA: 09/29/2024 PCP: Onita Rush, MD    Brief Narrative:   78 year old female followed by Dr. Elmira with a history of nonischemic cardiomyopathy, pericardial effusion status post pericardiocentesis (01/2021) , HFrEF, nonobstructive coronary disease and permanent A-fib on Eliquis  along with aortic stenosis and stage IIIb chronic kidney disease. She was recently seen for preoperative clearance for cataract surgery and had reported some dizziness and symptoms suggesting possible overdiuresis. Her diuretics were reduced. She also has a history of hyponatremia. LVEF was 30 to 35%. She now presents over the past 5 days with increasing agitation, lower extremity edema, shortness of breath and visual and auditory hallucinations. She had a similar episode in November 2024. Her PCP diagnosed her with a UTI few days ago and she was started on antibiotics but not improving. In the emergency department she was noted to be in A-fib with heart rate of 90, creatinine was elevated 2.55 above her baseline of 1.5. BNP was elevated at 762 and lactate was 2.2. Chest x-ray showed scarring and possible infiltrate. CT chest showed moderate right and small left pleural effusions with underlying chronic interstitial lung disease. Urinalysis showed nitrite and leukoesterase negative but positive for rare bacteria and she was continued on IV ceftriaxone .   On admission found to have acute CVA on MRI, seen by neurology who recommended continuing Eliquis  as rest of the workup was unremarkable.  Cardiology has been following the patient for diuresis.  Assessment & Plan:     Acute metabolic encephalopathy Suspect possibly secondary to acute CVA versus recent infection?  Ammonia levels are normal.  Urine culture here shows insignificant growth   Acute ischemic stroke:  Due to encephalopathy of unknown source, patient ended up having CT head which was  unremarkable followed by MRI brain which showed Punctate acute ischemic nonhemorrhagic infarct in the high posterior left frontal lobe, precentral gyrus.  A1c 6.0, LDL 49.  Carotid Dopplers, MRI brain and EEG are unremarkable.  Neurology recommending to continue Eliquis       Acute on chronic systolic CHF exacerbation, EF 74% TTE shows LVEF 20-25%.  On dobutamine feeling lightheaded dizzy with dark urine therefore we will hold Lasix  unless advised by cardiology -Not a good candidate for advanced therapies -Had Unna boots     Asymptomatic bacteriuria  Patient afebrile, no leukocytosis, UA not impressive for UTI.  I do not think this is true UTI, discontinued antibiotics.   AKI on CKD stage IIIb  Baseline creatinine 1.5 but presented with 2.5.  Slowly improving and stabilizing with diuresis    Hypoglycemia  No prior history of diabetes, blood sugar yesterday morning was 58, hemoglobin A1c 6.0.  Likely due to poor p.o. intake.    Permanent atrial fibrillation -Rapid heart rate due to dobutamine, eventually medication will be adjusted by cardiology   Acquired hypothyroidism  Continue Synthroid .     Body mass index is 30.93 kg/m.    DVT prophylaxis: apixaban  (ELIQUIS ) tablet 5 mg      Code Status: Limited: Do not attempt resuscitation (DNR) -DNR-LIMITED -Do Not Intubate/DNI  Family Communication:   Status is: Inpatient Remains inpatient appropriate because: On going diuresis per cardiology.    PT Follow up Recs:   Subjective:  Seen at bedside.  Feeling dizzy whenever she gets up with tachycardia Urine is dark.  Denies any other complaints  Examination:  General exam: Appears calm and comfortable  Respiratory system: Clear to auscultation. Respiratory effort normal.  Cardiovascular system: Tachycardia, irregularly irregular.  3+ bilateral lower extremity pitting edema Gastrointestinal system: Abdomen is nondistended, soft and nontender. No organomegaly or masses felt. Normal  bowel sounds heard. Central nervous system: Alert and oriented. No focal neurological deficits. Extremities: Symmetric 5 x 5 power. Skin: No rashes, lesions or ulcers Psychiatry: Judgement and insight appear normal. Mood & affect appropriate.                Diet Orders (From admission, onward)     Start     Ordered   09/30/24 0817  Diet Heart Room service appropriate? Yes; Fluid consistency: Thin  Diet effective now       Question Answer Comment  Room service appropriate? Yes   Fluid consistency: Thin      09/30/24 0816            Objective: Vitals:   10/03/24 1921 10/04/24 0405 10/04/24 0452 10/04/24 0724  BP: 113/72 (!) 93/54 105/71   Pulse: (!) 120 (!) 140 (!) 133   Resp: 18 19 19    Temp: 98.2 F (36.8 C) 98.6 F (37 C)  (!) 97.1 F (36.2 C)  TempSrc: Oral Oral  Oral  SpO2: 94% 97% 98%   Weight:      Height:        Intake/Output Summary (Last 24 hours) at 10/04/2024 1200 Last data filed at 10/03/2024 1808 Gross per 24 hour  Intake 354 ml  Output --  Net 354 ml   Filed Weights   09/30/24 1147 10/01/24 0451 10/02/24 0500  Weight: 88.6 kg 90.1 kg 86.9 kg    Scheduled Meds:  apixaban   5 mg Oral BID   Chlorhexidine  Gluconate Cloth  6 each Topical Daily   furosemide   40 mg Intravenous BID   gabapentin  100 mg Oral BID   levothyroxine   125 mcg Oral QAC breakfast   pantoprazole   40 mg Oral Daily   rosuvastatin   20 mg Oral Daily   sodium chloride  flush  10-40 mL Intracatheter Q12H   sodium chloride  flush  3 mL Intravenous Q12H   Continuous Infusions:  DOBUTamine 2.5 mcg/kg/min (10/02/24 0000)    Nutritional status     Body mass index is 30.93 kg/m.  Data Reviewed:   CBC: Recent Labs  Lab 09/29/24 1303 09/29/24 1647 09/30/24 0442 10/03/24 0640 10/04/24 0530  WBC 10.3  --  8.0 7.5 7.8  NEUTROABS 8.4*  --   --   --   --   HGB 13.6 15.0 12.5 11.4* 12.2  HCT 42.6 44.0 39.4 36.5 40.3  MCV 82.1  --  83.8 85.3 86.3  PLT 258  --  206  178 197   Basic Metabolic Panel: Recent Labs  Lab 09/29/24 2110 09/30/24 0442 10/01/24 0229 10/01/24 0253 10/02/24 0245 10/03/24 0640 10/04/24 0530  NA  --  138 137  --  137 138 138  K  --  3.6 3.6  --  3.6 3.5 4.1  CL  --  94* 96*  --  100 101 101  CO2  --  28 23  --  26 26 26   GLUCOSE  --  76 82  --  110* 115* 105*  BUN  --  48* 51*  --  46* 32* 33*  CREATININE  --  2.49* 2.49*  --  2.26* 1.65* 1.87*  CALCIUM   --  8.5* 8.5*  --  8.4* 8.0* 8.5*  MG 2.4  --   --  2.4  --  2.2 2.1  PHOS  --  5.8*  --   --  3.6 2.8 3.0   GFR: Estimated Creatinine Clearance: 27.1 mL/min (A) (by C-G formula based on SCr of 1.87 mg/dL (H)). Liver Function Tests: Recent Labs  Lab 09/29/24 1303 09/30/24 0442  AST 24 22  ALT 22 20  ALKPHOS 41 42  BILITOT 0.7 0.7  PROT 5.4* 5.7*  ALBUMIN 3.7 3.2*   No results for input(s): LIPASE, AMYLASE in the last 168 hours. Recent Labs  Lab 09/29/24 1304  AMMONIA 19   Coagulation Profile: No results for input(s): INR, PROTIME in the last 168 hours. Cardiac Enzymes: No results for input(s): CKTOTAL, CKMB, CKMBINDEX, TROPONINI in the last 168 hours. BNP (last 3 results) Recent Labs    08/20/24 1347  PROBNP 12,768*   HbA1C: No results for input(s): HGBA1C in the last 72 hours. CBG: Recent Labs  Lab 09/30/24 1346 10/01/24 0449 10/01/24 1120 10/01/24 2050 10/02/24 0434  GLUCAP 104* 74 89 116* 101*   Lipid Profile: No results for input(s): CHOL, HDL, LDLCALC, TRIG, CHOLHDL, LDLDIRECT in the last 72 hours. Thyroid  Function Tests: Recent Labs    10/04/24 0530  TSH 6.389*   Anemia Panel: No results for input(s): VITAMINB12, FOLATE, FERRITIN, TIBC, IRON, RETICCTPCT in the last 72 hours. Sepsis Labs: Recent Labs  Lab 10/01/24 1240 10/02/24 0942 10/03/24 0801 10/04/24 0540  LATICACIDVEN 4.2* 1.4 1.1 0.8    Recent Results (from the past 240 hours)  Urine Culture     Status: Abnormal    Collection Time: 09/29/24  3:16 PM   Specimen: Urine, Clean Catch  Result Value Ref Range Status   Specimen Description URINE, CLEAN CATCH  Final   Special Requests Normal  Final   Culture (A)  Final    <10,000 COLONIES/mL INSIGNIFICANT GROWTH Performed at [facility], [address], [place], [place] [address]    Report Status 10/01/2024 FINAL  Final  Respiratory (~20 pathogens) panel by PCR     Status: None   Collection Time: 09/29/24  4:51 PM   Specimen: Nasopharyngeal Swab; Respiratory  Result Value Ref Range Status   Adenovirus NOT DETECTED NOT DETECTED Final   Coronavirus 229E NOT DETECTED NOT DETECTED Final    Comment: (NOTE) The Coronavirus on the Respiratory Panel, DOES NOT test for the novel  Coronavirus (2019 nCoV)    Coronavirus HKU1 NOT DETECTED NOT DETECTED Final   Coronavirus NL63 NOT DETECTED NOT DETECTED Final   Coronavirus OC43 NOT DETECTED NOT DETECTED Final   Metapneumovirus NOT DETECTED NOT DETECTED Final   Rhinovirus / Enterovirus NOT DETECTED NOT DETECTED Final   Influenza A NOT DETECTED NOT DETECTED Final   Influenza B NOT DETECTED NOT DETECTED Final   Parainfluenza Virus 1 NOT DETECTED NOT DETECTED Final   Parainfluenza Virus 2 NOT DETECTED NOT DETECTED Final   Parainfluenza Virus 3 NOT DETECTED NOT DETECTED Final   Parainfluenza Virus 4 NOT DETECTED NOT DETECTED Final   Respiratory Syncytial Virus NOT DETECTED NOT DETECTED Final   Bordetella pertussis NOT DETECTED NOT DETECTED Final   Bordetella Parapertussis NOT DETECTED NOT DETECTED Final   Chlamydophila pneumoniae NOT DETECTED NOT DETECTED Final   Mycoplasma pneumoniae NOT DETECTED NOT DETECTED Final    Comment: Performed at [facility], [address], [place], [place] [address]  Blood culture (routine x 2)     Status: None   Collection Time: 09/29/24  8:56 PM   Specimen: BLOOD  Result Value Ref Range Status  Specimen Description BLOOD SITE NOT SPECIFIED  Final   Special Requests    Final    BOTTLES DRAWN AEROBIC ONLY Blood Culture results may not be optimal due to an inadequate volume of blood received in culture bottles   Culture   Final    NO GROWTH 5 DAYS Performed at [facility], [address], [place], [place] [address]    Report Status 10/04/2024 FINAL  Final  Blood culture (routine x 2)     Status: None   Collection Time: 09/29/24  9:00 PM   Specimen: BLOOD RIGHT WRIST  Result Value Ref Range Status   Specimen Description BLOOD RIGHT WRIST  Final   Special Requests   Final    BOTTLES DRAWN AEROBIC ONLY Blood Culture results may not be optimal due to an inadequate volume of blood received in culture bottles   Culture   Final    NO GROWTH 5 DAYS Performed at [facility], [address] [place], [place] [address]    Report Status 10/04/2024 FINAL  Final         Radiology Studies: US  RENAL Result Date: 10/02/2024 CLINICAL DATA:  Acute kidney injury EXAM: RENAL / URINARY TRACT ULTRASOUND COMPLETE COMPARISON:  Renal ultrasound examination dated 06/20/2021 FINDINGS: Right Kidney: Length = 9.9 cm Normal parenchymal echogenicity with preserved corticomedullary differentiation. No urinary tract dilation or shadowing calculi. The ureter is not seen. Left Kidney: Length = 8.3 cm Diffuse cortical thinning. Normal parenchymal echogenicity with preserved corticomedullary differentiation. No urinary tract dilation or shadowing calculi. The ureter is not seen. Bladder: Appears normal for degree of bladder distention. Prevoid bladder volume measures 138 mL. Other: Partially imaged bilateral pleural effusions. IMPRESSION: 1. No urinary tract dilation or shadowing calculi. 2. Diffuse cortical thinning of the left kidney. 3. Partially imaged bilateral pleural effusions. Electronically Signed   By: Limin  Xu M.D.   On: 10/02/2024 15:23           LOS: 5 days   Time spent= 35 mins    Burgess JAYSON Dare, MD [facility]  If 7PM-7AM, please contact  night-coverage  10/04/2024, 12:00 PM

## 2024-10-04 NOTE — Plan of Care (Signed)

## 2024-10-04 NOTE — Progress Notes (Signed)
 Orthopedic Tech Progress Note Patient Details:  [name] 1946-03-16 [id number]  Ortho Devices Type of Ortho Device: Ace wrap, Unna boot Ortho Device/Splint Location: BLE Ortho Device/Splint Interventions: Ordered, Application, Adjustment   Post Interventions Patient Tolerated: Well Instructions Provided: Care of device  Delanna LITTIE Pac 10/04/2024, 2:35 PM

## 2024-10-04 NOTE — Progress Notes (Signed)
 Rounding Note    Patient Name: [name] Date of Encounter: 10/04/2024  [facility] Cardiologist: Newman JINNY Lawrence, MD   Subjective   Patient is feeling unchanged. We had extensive conversation about her heart failure, see below.  Inpatient Medications    Scheduled Meds:  apixaban   5 mg Oral BID   Chlorhexidine  Gluconate Cloth  6 each Topical Daily   furosemide   40 mg Intravenous BID   gabapentin  100 mg Oral BID   levothyroxine   125 mcg Oral QAC breakfast   pantoprazole   40 mg Oral Daily   rosuvastatin   20 mg Oral Daily   sodium chloride  flush  10-40 mL Intracatheter Q12H   sodium chloride  flush  3 mL Intravenous Q12H   Continuous Infusions:  amiodarone      amiodarone      DOBUTamine 2.5 mcg/kg/min (10/02/24 0000)   PRN Meds: acetaminophen  **OR** acetaminophen , glucagon (human recombinant), guaiFENesin , hydrALAZINE , ipratropium-albuterol , sodium chloride  flush   Vital Signs    Vitals:   10/04/24 0452 10/04/24 0724 10/04/24 1508 10/04/24 1600  BP: 105/71  101/72 110/71  Pulse: (!) 133  (!) 110   Resp: 19  20   Temp:  (!) 97.1 F (36.2 C) (!) 96.3 F (35.7 C)   TempSrc:  Oral Axillary   SpO2: 98%  (!) 89%   Weight:      Height:        Intake/Output Summary (Last 24 hours) at 10/04/2024 1822 Last data filed at 10/04/2024 1749 Gross per 24 hour  Intake --  Output 850 ml  Net -850 ml      10/02/2024    5:00 AM 10/01/2024    4:51 AM 09/30/2024   11:47 AM  Last 3 Weights  Weight (lbs) 191 lb 9.6 oz 198 lb 10.2 oz 195 lb 5.2 oz  Weight (kg) 86.909 kg 90.1 kg 88.6 kg      Telemetry    Atrial fibrillation, rates 110s=120s - Personally Reviewed  Physical Exam   GEN: No acute distress.   Neck: No JVD Cardiac: irregularly irregular, 2/6 systolic ejection murmur Respiratory: Clear in upper fields with diminished sounds at bases GI: Soft, nontender, non-distended  MS: Warm, minimal pitting edema; No deformity. Neuro:  Nonfocal  Psych:  Normal affect   New pertinent results (labs, ECG, imaging, cardiac studies)     Assessment & Plan    Cardiogenic shock requiring inotropes Lactic acidosis Acute on chronic systolic and diastolic heart failure Acute kidney injury on chronic kidney disease stage 3b -LVEF 20-25%, down from prior. Dilated LV, global hypokinesis. RV with mildly reduced function, RVSP 54 -lactate cleared with start of dobutamine, renal function down from peak but worse than yesterday -no coox today but was 62.8 10/31 and 71.0 11/1 -lasix  held today, patient reports lightheadedness and dark urine -not a candidate for advanced therapies, this is palliative inotropes only -SGLT2i, beta blocker on hold given AKI and shock -no ARB/ARNI/MRA with low blood pressure and AKI -difficult to determine overall diuresis; no daily weights, I/O are actually net positive about 600 cc.  Acute left frontal lobe infarct Acute metabolic encephalopathy  -seen by neurology, ok to continue apixaban , consider change to pradaxa as outpatient -stroke not felt to be etiology of altered mental status  Permanent atrial fibrillation: metoprolol  held given low output. On apixaban . Difficult situation as dobutamine drives HR up. No digoxin  given renal function. Discussed amiodarone  for rate control, not rhythm control  Very difficult situation. We do not have  much in terms of options. I had an extensive conversation lasting over 60 minutes with patient and husband at bedside, then I called daughter [name] on the phone at patient's request and discussed further.  We discussed her worsening EF, not a candidate for advanced therapies. Discussed inotropes use for short term assistance but long term can actually cause sooner mortality. Discussed lack of available medications for management of her heart failure, issues with kidneys, difficulty with diuresis. Discussed that we need to plan for what treatment will look like post discharge, whether  that be a return to prior management (returning to hospital with exacerbations), or palliative care +/- home inotropes.  She was overwhelmed by this, she and husband did not realize the trajectory. Discussed that it is not expected imminently, but with her progression could be in the range of weeks to months, but unable to predict. They want to think and discuss with family.  For now, will use IV amiodarone  as rate control agent given lack of other options, continue dobutamine for now. We will have a family meeting on Wednesday, they will give me a time, and we will talk about their preferences regarding goals and next steps.  High complexity medical decision making, high risk of mortality given conditions.    Signed, Shelda Bruckner, MD  10/04/2024, 6:22 PM

## 2024-10-04 NOTE — Discharge Instructions (Signed)

## 2024-10-04 NOTE — Plan of Care (Signed)
   Problem: Education: Goal: Knowledge of General Education information will improve Description Including pain rating scale, medication(s)/side effects and non-pharmacologic comfort measures Outcome: Progressing

## 2024-10-04 NOTE — Hospital Course (Addendum)
 Brief Narrative:   78 year old [name] followed by Dr. Elmira with a history of nonischemic cardiomyopathy, pericardial effusion status post pericardiocentesis (01/2021) , HFrEF, nonobstructive coronary disease and permanent A-fib on Eliquis  along with aortic stenosis and stage IIIb chronic kidney disease. She was recently seen for preoperative clearance for cataract surgery and had reported some dizziness and symptoms suggesting possible overdiuresis. Her diuretics were reduced. She also has a history of hyponatremia. LVEF was 30 to 35%. She now presents over the past 5 days with increasing agitation, lower extremity edema, shortness of breath and visual and auditory hallucinations. She had a similar episode in November 2024. Her PCP diagnosed her with a UTI few days ago and she was started on antibiotics but not improving. In the emergency department she was noted to be in A-fib with heart rate of 90, creatinine was elevated 2.55 above her baseline of 1.5. BNP was elevated at 762 and lactate was 2.2. Chest x-ray showed scarring and possible infiltrate. CT chest showed moderate right and small left pleural effusions with underlying chronic interstitial lung disease. Urinalysis showed nitrite and leukoesterase negative but positive for rare bacteria and she was continued on IV ceftriaxone .   On admission found to have acute CVA on MRI, seen by neurology who recommended continuing Eliquis  as rest of the workup was unremarkable.  Cardiology has been following the patient for diuresis.  Eventually patient was weaned off of dobutamine and amiodarone  drip.  Plan was to transition him home with hospice services on p.o. diuretics.  Multiple cardiac medications including metoprolol , isosorbide  and Jardiance  were also discontinued.  Medically stable for discharge home with hospice  Assessment & Plan:     Acute on chronic systolic CHF exacerbation, EF 74% TTE shows LVEF 20-25%.  Continue Unna boots.  Weaned off  dobutamine drip.  Not a good candidate for advanced therapies.  GDMT/diuresis per cardiology team as mentioned above  Permanent atrial fibrillation - Rates are slightly better.  Now off dobutamine and amiodarone  drip.  She will remain on Eliquis  at home but in the future can consider stopping this.  Also for now stopping metoprolol  and amiodarone  drip per cardiology recommendations.  Acute metabolic encephalopathy Suspect possibly secondary to acute CVA versus recent infection?  Ammonia levels are normal.  Urine culture here shows insignificant growth   Acute ischemic stroke:  Due to encephalopathy of unknown source, patient ended up having CT head which was unremarkable followed by MRI brain which showed Punctate acute ischemic nonhemorrhagic infarct in the high posterior left frontal lobe, precentral gyrus.  A1c 6.0, LDL 49.  Carotid Dopplers, MRI brain and EEG are unremarkable.  Neurology recommending to continue Eliquis     Asymptomatic bacteriuria  Patient afebrile, no leukocytosis, UA not impressive for UTI.  I do not think this is true UTI, discontinued antibiotics.   AKI on CKD stage IIIb, improved  Baseline creatinine 1.5 but presented with 2.5.  Slowly improving and stabilizing with diuresis    Hypoglycemia, improved  No prior history of diabetes, blood sugar yesterday morning was Patricia, hemoglobin A1c 6.0.  Likely due to poor p.o. intake.       Acquired hypothyroidism  Continue Synthroid .  Normal free T4, slightly elevated TSH.  Repeat outpatient in 3 weeks     Body mass index is 30.93 kg/m.   Plans to transition her home with hospice Discontinue further lab work   DVT prophylaxis: apixaban  (ELIQUIS ) tablet 5 mg      Code Status: Limited: Do not attempt resuscitation (  DNR) -DNR-LIMITED -Do Not Intubate/DNI  Family Communication: Family at bedside Status is: Inpatient Remains inpatient appropriate because: Charge  PT Follow up Recs:   Subjective: Feels well no  complaints.  Examination:  General exam: Appears calm and comfortable  Respiratory system: Clear to auscultation. Respiratory effort normal. Cardiovascular system: Tachycardia, irregularly irregular.  3+ bilateral lower extremity pitting edema Gastrointestinal system: Abdomen is nondistended, soft and nontender. No organomegaly or masses felt. Normal bowel sounds heard. Central nervous system: Alert and oriented. No focal neurological deficits. Extremities: Symmetric 5 x 5 power. Skin: No rashes, lesions or ulcers Psychiatry: Judgement and insight appear normal. Mood & affect appropriate.

## 2024-10-04 NOTE — Care Management (Signed)
 Patient HR AFIB 150-160. On call MD and Cardiology made aware. Cardiology stated to continue to monitor and let them know if patient becomes hypotensive.

## 2024-10-05 DIAGNOSIS — R570 Cardiogenic shock: Secondary | ICD-10-CM | POA: Diagnosis not present

## 2024-10-05 DIAGNOSIS — I5043 Acute on chronic combined systolic (congestive) and diastolic (congestive) heart failure: Secondary | ICD-10-CM | POA: Diagnosis not present

## 2024-10-05 DIAGNOSIS — R400 Somnolence: Secondary | ICD-10-CM | POA: Diagnosis not present

## 2024-10-05 DIAGNOSIS — Z7189 Other specified counseling: Secondary | ICD-10-CM | POA: Diagnosis not present

## 2024-10-05 DIAGNOSIS — N179 Acute kidney failure, unspecified: Secondary | ICD-10-CM | POA: Diagnosis not present

## 2024-10-05 LAB — BASIC METABOLIC PANEL WITH GFR
Anion gap: 9 (ref 5–15)
BUN: 30 mg/dL — ABNORMAL HIGH (ref 8–23)
CO2: 27 mmol/L (ref 22–32)
Calcium: 8.4 mg/dL — ABNORMAL LOW (ref 8.9–10.3)
Chloride: 99 mmol/L (ref 98–111)
Creatinine, Ser: 1.74 mg/dL — ABNORMAL HIGH (ref 0.44–1.00)
GFR, Estimated: 29 mL/min — ABNORMAL LOW (ref >60)
Glucose, Bld: 103 mg/dL — ABNORMAL HIGH (ref 70–99)
Potassium: 4.1 mmol/L (ref 3.5–5.1)
Sodium: 135 mmol/L (ref 135–145)

## 2024-10-05 LAB — MAGNESIUM: Magnesium: 2 mg/dL (ref 1.7–2.4)

## 2024-10-05 LAB — CBC
HCT: 39.3 % (ref 36.0–46.0)
Hemoglobin: 12.2 g/dL (ref 12.0–15.0)
MCH: 26.5 pg (ref 26.0–34.0)
MCHC: 31 g/dL (ref 30.0–36.0)
MCV: 85.4 fL (ref 80.0–100.0)
Platelets: 195 K/uL (ref 150–400)
RBC: 4.6 MIL/uL (ref 3.87–5.11)
RDW: 17.8 % — ABNORMAL HIGH (ref 11.5–15.5)
WBC: 8 K/uL (ref 4.0–10.5)
nRBC: 0 % (ref 0.0–0.2)

## 2024-10-05 LAB — PHOSPHORUS: Phosphorus: 2.9 mg/dL (ref 2.5–4.6)

## 2024-10-05 LAB — T4, FREE: Free T4: 0.96 ng/dL (ref 0.61–1.12)

## 2024-10-05 NOTE — Progress Notes (Signed)
 PROGRESS NOTE    [name]  FMW:[id number] DOB: 09-Mar-1946 DOA: 09/29/2024 PCP: Onita Rush, MD    Brief Narrative:   78 year old female followed by Dr. Elmira with a history of nonischemic cardiomyopathy, pericardial effusion status post pericardiocentesis (01/2021) , HFrEF, nonobstructive coronary disease and permanent A-fib on Eliquis  along with aortic stenosis and stage IIIb chronic kidney disease. She was recently seen for preoperative clearance for cataract surgery and had reported some dizziness and symptoms suggesting possible overdiuresis. Her diuretics were reduced. She also has a history of hyponatremia. LVEF was 30 to 35%. She now presents over the past 5 days with increasing agitation, lower extremity edema, shortness of breath and visual and auditory hallucinations. She had a similar episode in November 2024. Her PCP diagnosed her with a UTI few days ago and she was started on antibiotics but not improving. In the emergency department she was noted to be in A-fib with heart rate of 90, creatinine was elevated 2.55 above her baseline of 1.5. BNP was elevated at 762 and lactate was 2.2. Chest x-ray showed scarring and possible infiltrate. CT chest showed moderate right and small left pleural effusions with underlying chronic interstitial lung disease. Urinalysis showed nitrite and leukoesterase negative but positive for rare bacteria and she was continued on IV ceftriaxone .   On admission found to have acute CVA on MRI, seen by neurology who recommended continuing Eliquis  as rest of the workup was unremarkable.  Cardiology has been following the patient for diuresis.  Assessment & Plan:     Acute on chronic systolic CHF exacerbation, EF 74% TTE shows LVEF 20-25%.  On dobutamine feeling lightheaded dizzy with dark urine therefore we will hold Lasix  unless advised by cardiology -Not a good candidate for advanced therapies -Had Unna boots  Permanent atrial fibrillation -Rapid  heart rate due to dobutamine, also started on amiodarone  drip.   Acute metabolic encephalopathy Suspect possibly secondary to acute CVA versus recent infection?  Ammonia levels are normal.  Urine culture here shows insignificant growth   Acute ischemic stroke:  Due to encephalopathy of unknown source, patient ended up having CT head which was unremarkable followed by MRI brain which showed Punctate acute ischemic nonhemorrhagic infarct in the high posterior left frontal lobe, precentral gyrus.  A1c 6.0, LDL 49.  Carotid Dopplers, MRI brain and EEG are unremarkable.  Neurology recommending to continue Eliquis         Asymptomatic bacteriuria  Patient afebrile, no leukocytosis, UA not impressive for UTI.  I do not think this is true UTI, discontinued antibiotics.   AKI on CKD stage IIIb  Baseline creatinine 1.5 but presented with 2.5.  Slowly improving and stabilizing with diuresis    Hypoglycemia  No prior history of diabetes, blood sugar yesterday morning was 58, hemoglobin A1c 6.0.  Likely due to poor p.o. intake.       Acquired hypothyroidism  Continue Synthroid .     Body mass index is 30.93 kg/m.    DVT prophylaxis: apixaban  (ELIQUIS ) tablet 5 mg      Code Status: Limited: Do not attempt resuscitation (DNR) -DNR-LIMITED -Do Not Intubate/DNI  Family Communication: Family at bedside Status is: Inpatient Remains inpatient appropriate because: On going diuresis per cardiology.    PT Follow up Recs:   Subjective:  Seen at bedside still remains tachycardic.  Now on amiodarone  drip and dobutamine. Sitting up in the recliner  Examination:  General exam: Appears calm and comfortable  Respiratory system: Clear to auscultation. Respiratory effort normal. Cardiovascular  system: Tachycardia, irregularly irregular.  3+ bilateral lower extremity pitting edema Gastrointestinal system: Abdomen is nondistended, soft and nontender. No organomegaly or masses felt. Normal bowel  sounds heard. Central nervous system: Alert and oriented. No focal neurological deficits. Extremities: Symmetric 5 x 5 power. Skin: No rashes, lesions or ulcers Psychiatry: Judgement and insight appear normal. Mood & affect appropriate.                Diet Orders (From admission, onward)     Start     Ordered   09/30/24 0817  Diet Heart Room service appropriate? Yes; Fluid consistency: Thin  Diet effective now       Question Answer Comment  Room service appropriate? Yes   Fluid consistency: Thin      09/30/24 0816            Objective: Vitals:   10/05/24 0417 10/05/24 0500 10/05/24 0800 10/05/24 1130  BP: 108/65  98/65 104/65  Pulse: (!) 120  (!) 109 94  Resp: 19  18 (!) 25  Temp: (!) 97.5 F (36.4 C)  97.6 F (36.4 C) 97.6 F (36.4 C)  TempSrc: Oral  Oral Oral  SpO2: 98%  98% 98%  Weight:  88.6 kg    Height:        Intake/Output Summary (Last 24 hours) at 10/05/2024 1212 Last data filed at 10/05/2024 0806 Gross per 24 hour  Intake 466.4 ml  Output 850 ml  Net -383.6 ml   Filed Weights   10/01/24 0451 10/02/24 0500 10/05/24 0500  Weight: 90.1 kg 86.9 kg 88.6 kg    Scheduled Meds:  apixaban   5 mg Oral BID   Chlorhexidine  Gluconate Cloth  6 each Topical Daily   furosemide   40 mg Intravenous BID   gabapentin  100 mg Oral BID   levothyroxine   125 mcg Oral QAC breakfast   pantoprazole   40 mg Oral Daily   rosuvastatin   20 mg Oral Daily   sodium chloride  flush  10-40 mL Intracatheter Q12H   sodium chloride  flush  3 mL Intravenous Q12H   Continuous Infusions:  amiodarone  30 mg/hr (10/05/24 1137)   DOBUTamine 2.5 mcg/kg/min (10/04/24 1825)    Nutritional status     Body mass index is 31.54 kg/m.  Data Reviewed:   CBC: Recent Labs  Lab 09/29/24 1303 09/29/24 1647 09/30/24 0442 10/03/24 0640 10/04/24 0530 10/05/24 0340  WBC 10.3  --  8.0 7.5 7.8 8.0  NEUTROABS 8.4*  --   --   --   --   --   HGB 13.6 15.0 12.5 11.4* 12.2 12.2  HCT  42.6 44.0 39.4 36.5 40.3 39.3  MCV 82.1  --  83.8 85.3 86.3 85.4  PLT 258  --  206 178 197 195   Basic Metabolic Panel: Recent Labs  Lab 09/29/24 2110 09/30/24 0442 10/01/24 0229 10/01/24 0253 10/02/24 0245 10/03/24 0640 10/04/24 0530 10/05/24 0340  NA  --  138 137  --  137 138 138 135  K  --  3.6 3.6  --  3.6 3.5 4.1 4.1  CL  --  94* 96*  --  100 101 101 99  CO2  --  28 23  --  26 26 26 27   GLUCOSE  --  76 82  --  110* 115* 105* 103*  BUN  --  48* 51*  --  46* 32* 33* 30*  CREATININE  --  2.49* 2.49*  --  2.26* 1.65* 1.87* 1.74*  CALCIUM   --  8.5* 8.5*  --  8.4* 8.0* 8.5* 8.4*  MG 2.4  --   --  2.4  --  2.2 2.1 2.0  PHOS  --  5.8*  --   --  3.6 2.8 3.0 2.9   GFR: Estimated Creatinine Clearance: 29.4 mL/min (A) (by C-G formula based on SCr of 1.74 mg/dL (H)). Liver Function Tests: Recent Labs  Lab 09/29/24 1303 09/30/24 0442  AST 24 22  ALT 22 20  ALKPHOS 41 42  BILITOT 0.7 0.7  PROT 5.4* 5.7*  ALBUMIN 3.7 3.2*   No results for input(s): LIPASE, AMYLASE in the last 168 hours. Recent Labs  Lab 09/29/24 1304  AMMONIA 19   Coagulation Profile: No results for input(s): INR, PROTIME in the last 168 hours. Cardiac Enzymes: No results for input(s): CKTOTAL, CKMB, CKMBINDEX, TROPONINI in the last 168 hours. BNP (last 3 results) Recent Labs    08/20/24 1347  PROBNP 12,768*   HbA1C: No results for input(s): HGBA1C in the last 72 hours. CBG: Recent Labs  Lab 09/30/24 1346 10/01/24 0449 10/01/24 1120 10/01/24 2050 10/02/24 0434  GLUCAP 104* 74 89 116* 101*   Lipid Profile: No results for input(s): CHOL, HDL, LDLCALC, TRIG, CHOLHDL, LDLDIRECT in the last 72 hours. Thyroid  Function Tests: Recent Labs    10/04/24 0530 10/05/24 0340  TSH 6.389*  --   FREET4  --  0.96   Anemia Panel: No results for input(s): VITAMINB12, FOLATE, FERRITIN, TIBC, IRON, RETICCTPCT in the last 72 hours. Sepsis Labs: Recent Labs  Lab  10/01/24 1240 10/02/24 0942 10/03/24 0801 10/04/24 0540  LATICACIDVEN 4.2* 1.4 1.1 0.8    Recent Results (from the past 240 hours)  Urine Culture     Status: Abnormal   Collection Time: 09/29/24  3:16 PM   Specimen: Urine, Clean Catch  Result Value Ref Range Status   Specimen Description URINE, CLEAN CATCH  Final   Special Requests Normal  Final   Culture (A)  Final    <10,000 COLONIES/mL INSIGNIFICANT GROWTH Performed at [facility], [address] [place], [place] [address]    Report Status 10/01/2024 FINAL  Final  Respiratory (~20 pathogens) panel by PCR     Status: None   Collection Time: 09/29/24  4:51 PM   Specimen: Nasopharyngeal Swab; Respiratory  Result Value Ref Range Status   Adenovirus NOT DETECTED NOT DETECTED Final   Coronavirus 229E NOT DETECTED NOT DETECTED Final    Comment: (NOTE) The Coronavirus on the Respiratory Panel, DOES NOT test for the novel  Coronavirus (2019 nCoV)    Coronavirus HKU1 NOT DETECTED NOT DETECTED Final   Coronavirus NL63 NOT DETECTED NOT DETECTED Final   Coronavirus OC43 NOT DETECTED NOT DETECTED Final   Metapneumovirus NOT DETECTED NOT DETECTED Final   Rhinovirus / Enterovirus NOT DETECTED NOT DETECTED Final   Influenza A NOT DETECTED NOT DETECTED Final   Influenza B NOT DETECTED NOT DETECTED Final   Parainfluenza Virus 1 NOT DETECTED NOT DETECTED Final   Parainfluenza Virus 2 NOT DETECTED NOT DETECTED Final   Parainfluenza Virus 3 NOT DETECTED NOT DETECTED Final   Parainfluenza Virus 4 NOT DETECTED NOT DETECTED Final   Respiratory Syncytial Virus NOT DETECTED NOT DETECTED Final   Bordetella pertussis NOT DETECTED NOT DETECTED Final   Bordetella Parapertussis NOT DETECTED NOT DETECTED Final   Chlamydophila pneumoniae NOT DETECTED NOT DETECTED Final   Mycoplasma pneumoniae NOT DETECTED NOT DETECTED Final    Comment: Performed at [facility],  [address] [place], [place] [address]  Blood culture (routine x 2)      Status: None   Collection Time: 09/29/24  8:56 PM   Specimen: BLOOD  Result Value Ref Range Status   Specimen Description BLOOD SITE NOT SPECIFIED  Final   Special Requests   Final    BOTTLES DRAWN AEROBIC ONLY Blood Culture results may not be optimal due to an inadequate volume of blood received in culture bottles   Culture   Final    NO GROWTH 5 DAYS Performed at [facility], [address], [place], [place] [address]    Report Status 10/04/2024 FINAL  Final  Blood culture (routine x 2)     Status: None   Collection Time: 09/29/24  9:00 PM   Specimen: BLOOD RIGHT WRIST  Result Value Ref Range Status   Specimen Description BLOOD RIGHT WRIST  Final   Special Requests   Final    BOTTLES DRAWN AEROBIC ONLY Blood Culture results may not be optimal due to an inadequate volume of blood received in culture bottles   Culture   Final    NO GROWTH 5 DAYS Performed at [facility], [address], [place], [place] [address]    Report Status 10/04/2024 FINAL  Final         Radiology Studies: No results found.         LOS: 6 days   Time spent= 35 mins    Patricia JAYSON Dare, MD [facility]  If 7PM-7AM, please contact night-coverage  10/05/2024, 12:12 PM

## 2024-10-05 NOTE — Progress Notes (Signed)
 Rounding Note    Patient Name: MORGANNA STYLES Date of Encounter: 10/05/2024  [facility] Cardiologist: Newman JINNY Lawrence, MD   Subjective   Resting comfortably. No new complaints. Husband asking about when she can go home, reviewed need to determine goals of care. They note that patient's daughter will be present tomorrow so that we can have family meeting.  Inpatient Medications    Scheduled Meds:  apixaban   5 mg Oral BID   Chlorhexidine  Gluconate Cloth  6 each Topical Daily   furosemide   40 mg Intravenous BID   gabapentin  100 mg Oral BID   levothyroxine   125 mcg Oral QAC breakfast   pantoprazole   40 mg Oral Daily   rosuvastatin   20 mg Oral Daily   sodium chloride  flush  10-40 mL Intracatheter Q12H   sodium chloride  flush  3 mL Intravenous Q12H   Continuous Infusions:  amiodarone  30 mg/hr (10/05/24 1137)   DOBUTamine 2.5 mcg/kg/min (10/04/24 1825)   PRN Meds: acetaminophen  **OR** acetaminophen , glucagon (human recombinant), guaiFENesin , hydrALAZINE , ipratropium-albuterol , sodium chloride  flush   Vital Signs    Vitals:   10/05/24 0417 10/05/24 0500 10/05/24 0800 10/05/24 1130  BP: 108/65  98/65 104/65  Pulse: (!) 120  (!) 109 94  Resp: 19  18 (!) 25  Temp: (!) 97.5 F (36.4 C)  97.6 F (36.4 C) 97.6 F (36.4 C)  TempSrc: Oral  Oral Oral  SpO2: 98%  98% 98%  Weight:  88.6 kg    Height:        Intake/Output Summary (Last 24 hours) at 10/05/2024 1658 Last data filed at 10/05/2024 0806 Gross per 24 hour  Intake 466.4 ml  Output 850 ml  Net -383.6 ml      10/05/2024    5:00 AM 10/02/2024    5:00 AM 10/01/2024    4:51 AM  Last 3 Weights  Weight (lbs) 195 lb 6.4 oz 191 lb 9.6 oz 198 lb 10.2 oz  Weight (kg) 88.633 kg 86.909 kg 90.1 kg      Telemetry    Atrial fibrillation, rates elevated but down just slightly from yesterday - Personally Reviewed  Physical Exam   GEN: Well nourished, well developed in no acute distress NECK: No  JVD CARDIAC: irregularly irregular rhythm, normal S1 and S2, no rubs or gallops. 2/6 systolic murmur. VASCULAR: Radial pulses 2+ bilaterally.  RESPIRATORY:  Clear to auscultation without rales, wheezing or rhonchi  ABDOMEN: Soft, non-tender, non-distended MUSCULOSKELETAL:  Moves all 4 limbs independently SKIN: Warm and dry, no edema NEUROLOGIC:  No focal neuro deficits noted. PSYCHIATRIC:  Normal affect    New pertinent results (labs, ECG, imaging, cardiac studies)     Assessment & Plan    Cardiogenic shock requiring inotropes Lactic acidosis Acute on chronic systolic and diastolic heart failure Acute kidney injury on chronic kidney disease stage 3b -LVEF 20-25%, down from prior. Dilated LV, global hypokinesis. RV with mildly reduced function, RVSP 54 -lactate cleared with start of dobutamine, renal function down from peak but worse than yesterday -see discussion re: inotropes from 11/3 (short term vs. Palliative at home) -no coox today but was 62.8 10/31 and 71.0 11/1 -lasix  held, renal function improved today -not a candidate for advanced therapies -SGLT2i, beta blocker on hold given AKI and shock -no ARB/ARNI/MRA with low blood pressure and AKI -difficult to determine overall diuresis; no daily weights, I/O are inaccurate  Acute left frontal lobe infarct Acute metabolic encephalopathy  -seen by neurology, ok to  continue apixaban , consider change to pradaxa as outpatient -stroke not felt to be etiology of altered mental status  Permanent atrial fibrillation: metoprolol  held given low output. On apixaban . Difficult situation as dobutamine drives HR up. No digoxin  given renal function. Slightly improved rates on IV amiodarone   Very difficult situation. See my comments on note from 11/3.  For now, will use IV amiodarone  as rate control agent given lack of other options, continue dobutamine for now. We will have a family meeting tomorrow, and we will talk about their preferences  regarding goals and next steps. I also told them that I would reach out to Dr. Elmira, and they appreciate his input as well.  High complexity medical decision making, high risk of mortality given conditions.    Signed, Shelda Bruckner, MD  10/05/2024, 4:58 PM

## 2024-10-05 NOTE — Plan of Care (Signed)

## 2024-10-06 DIAGNOSIS — I5043 Acute on chronic combined systolic (congestive) and diastolic (congestive) heart failure: Secondary | ICD-10-CM | POA: Diagnosis not present

## 2024-10-06 DIAGNOSIS — N179 Acute kidney failure, unspecified: Secondary | ICD-10-CM | POA: Diagnosis not present

## 2024-10-06 DIAGNOSIS — R570 Cardiogenic shock: Secondary | ICD-10-CM | POA: Diagnosis not present

## 2024-10-06 DIAGNOSIS — R400 Somnolence: Secondary | ICD-10-CM | POA: Diagnosis not present

## 2024-10-06 DIAGNOSIS — Z7189 Other specified counseling: Secondary | ICD-10-CM | POA: Diagnosis not present

## 2024-10-06 LAB — BASIC METABOLIC PANEL WITH GFR
Anion gap: 12 (ref 5–15)
BUN: 29 mg/dL — ABNORMAL HIGH (ref 8–23)
CO2: 26 mmol/L (ref 22–32)
Calcium: 8.1 mg/dL — ABNORMAL LOW (ref 8.9–10.3)
Chloride: 95 mmol/L — ABNORMAL LOW (ref 98–111)
Creatinine, Ser: 1.72 mg/dL — ABNORMAL HIGH (ref 0.44–1.00)
GFR, Estimated: 30 mL/min — ABNORMAL LOW (ref >60)
Glucose, Bld: 135 mg/dL — ABNORMAL HIGH (ref 70–99)
Potassium: 3.9 mmol/L (ref 3.5–5.1)
Sodium: 133 mmol/L — ABNORMAL LOW (ref 135–145)

## 2024-10-06 LAB — CBC
HCT: 38.6 % (ref 36.0–46.0)
Hemoglobin: 11.9 g/dL — ABNORMAL LOW (ref 12.0–15.0)
MCH: 26.6 pg (ref 26.0–34.0)
MCHC: 30.8 g/dL (ref 30.0–36.0)
MCV: 86.2 fL (ref 80.0–100.0)
Platelets: 194 K/uL (ref 150–400)
RBC: 4.48 MIL/uL (ref 3.87–5.11)
RDW: 17.3 % — ABNORMAL HIGH (ref 11.5–15.5)
WBC: 8 K/uL (ref 4.0–10.5)
nRBC: 0 % (ref 0.0–0.2)

## 2024-10-06 NOTE — TOC Progression Note (Signed)
 Transition of Care [facility]) - Progression Note    Patient Details  Name: [name] MRN: [id number] Date of Birth: 1946/03/06  [facility] CM/SW Contact  Waddell Barnie Rama, RN Phone Number: 10/06/2024, 4:25 PM  Clinical Narrative:    NCM offered choice for home hospice,  daughter, [name] at bedside states she does not know how hospice works,  this NCM asked if she would like to speak with a hospice rep , she states yes.  She states they live close to [facility].  NCM will have Cheri with [facility] to give [name] a call to speak with her.  [name] states they do not have a hospital bed, her mom likes to sleep in a recliner.  She does have a w/chair, walker, stand up shower, and a bsc.  This NCM will check back with [name] tomorrow.                     Expected Discharge Plan and Services                                               Social Drivers of Health (SDOH) Interventions SDOH Screenings   Food Insecurity: No Food Insecurity (09/30/2024)  Housing: Low Risk  (09/30/2024)  Transportation Needs: No Transportation Needs (09/30/2024)  Utilities: Not At Risk (09/30/2024)  Social Connections: Moderately Integrated (09/30/2024)  Tobacco Use: Low Risk  (09/29/2024)    Readmission Risk Interventions    09/30/2024    3:22 PM  Readmission Risk Prevention Plan  Transportation Screening Complete  Home Care Screening Complete  Medication Review (RN CM) Complete

## 2024-10-06 NOTE — Progress Notes (Signed)
 PROGRESS NOTE    [name]  FMW:[id number] DOB: 1946-04-06 DOA: 09/29/2024 PCP: Onita Rush, MD    Brief Narrative:   78 year old female followed by Dr. Elmira with a history of nonischemic cardiomyopathy, pericardial effusion status post pericardiocentesis (01/2021) , HFrEF, nonobstructive coronary disease and permanent A-fib on Eliquis  along with aortic stenosis and stage IIIb chronic kidney disease. She was recently seen for preoperative clearance for cataract surgery and had reported some dizziness and symptoms suggesting possible overdiuresis. Her diuretics were reduced. She also has a history of hyponatremia. LVEF was 30 to 35%. She now presents over the past 5 days with increasing agitation, lower extremity edema, shortness of breath and visual and auditory hallucinations. She had a similar episode in November 2024. Her PCP diagnosed her with a UTI few days ago and she was started on antibiotics but not improving. In the emergency department she was noted to be in A-fib with heart rate of 90, creatinine was elevated 2.55 above her baseline of 1.5. BNP was elevated at 762 and lactate was 2.2. Chest x-ray showed scarring and possible infiltrate. CT chest showed moderate right and small left pleural effusions with underlying chronic interstitial lung disease. Urinalysis showed nitrite and leukoesterase negative but positive for rare bacteria and she was continued on IV ceftriaxone .   On admission found to have acute CVA on MRI, seen by neurology who recommended continuing Eliquis  as rest of the workup was unremarkable.  Cardiology has been following the patient for diuresis.  Assessment & Plan:     Acute on chronic systolic CHF exacerbation, EF 74% TTE shows LVEF 20-25%.  Continue Unna boots.  Not a good candidate for advanced therapies.  GDMT/diuresis per cardiology team.  Permanent atrial fibrillation - Rate is better controlled on amiodarone  drip.  Hoping we can slowly transition  off positive inotropic and amiodarone  drip. - Continue Eliquis .  Acute metabolic encephalopathy Suspect possibly secondary to acute CVA versus recent infection?  Ammonia levels are normal.  Urine culture here shows insignificant growth   Acute ischemic stroke:  Due to encephalopathy of unknown source, patient ended up having CT head which was unremarkable followed by MRI brain which showed Punctate acute ischemic nonhemorrhagic infarct in the high posterior left frontal lobe, precentral gyrus.  A1c 6.0, LDL 49.  Carotid Dopplers, MRI brain and EEG are unremarkable.  Neurology recommending to continue Eliquis         Asymptomatic bacteriuria  Patient afebrile, no leukocytosis, UA not impressive for UTI.  I do not think this is true UTI, discontinued antibiotics.   AKI on CKD stage IIIb  Baseline creatinine 1.5 but presented with 2.5.  Slowly improving and stabilizing with diuresis    Hypoglycemia  No prior history of diabetes, blood sugar yesterday morning was 58, hemoglobin A1c 6.0.  Likely due to poor p.o. intake.       Acquired hypothyroidism  Continue Synthroid .     Body mass index is 30.93 kg/m.   I had goals of care discussion with the patient's daughter and the patient.  They are agreeable to discuss with hospice.   DVT prophylaxis: apixaban  (ELIQUIS ) tablet 5 mg      Code Status: Limited: Do not attempt resuscitation (DNR) -DNR-LIMITED -Do Not Intubate/DNI  Family Communication: Family at bedside Status is: Inpatient Remains inpatient appropriate because: On going diuresis per cardiology.    PT Follow up Recs:   Subjective: Seen at bedside, daughter is present as well Heart rate appears to be better but patient  still remains on dobutamine and amiodarone   Examination:  General exam: Appears calm and comfortable  Respiratory system: Clear to auscultation. Respiratory effort normal. Cardiovascular system: Tachycardia, irregularly irregular.  3+ bilateral lower  extremity pitting edema Gastrointestinal system: Abdomen is nondistended, soft and nontender. No organomegaly or masses felt. Normal bowel sounds heard. Central nervous system: Alert and oriented. No focal neurological deficits. Extremities: Symmetric 5 x 5 power. Skin: No rashes, lesions or ulcers Psychiatry: Judgement and insight appear normal. Mood & affect appropriate.                Diet Orders (From admission, onward)     Start     Ordered   09/30/24 0817  Diet Heart Room service appropriate? Yes; Fluid consistency: Thin  Diet effective now       Question Answer Comment  Room service appropriate? Yes   Fluid consistency: Thin      09/30/24 0816            Objective: Vitals:   10/05/24 1928 10/05/24 2349 10/06/24 0459 10/06/24 0500  BP: 103/61 123/63 107/64   Pulse: 83 (!) 104 97   Resp: (!) 23 (!) 22 19   Temp: (!) 97.4 F (36.3 C) (!) 97.3 F (36.3 C) (!) 97.5 F (36.4 C)   TempSrc: Oral Oral Oral   SpO2:  93% 94%   Weight:    89.9 kg  Height:        Intake/Output Summary (Last 24 hours) at 10/06/2024 1203 Last data filed at 10/06/2024 1015 Gross per 24 hour  Intake 1045.94 ml  Output 1500 ml  Net -454.06 ml   Filed Weights   10/02/24 0500 10/05/24 0500 10/06/24 0500  Weight: 86.9 kg 88.6 kg 89.9 kg    Scheduled Meds:  apixaban   5 mg Oral BID   Chlorhexidine  Gluconate Cloth  6 each Topical Daily   furosemide   40 mg Intravenous BID   gabapentin  100 mg Oral BID   levothyroxine   125 mcg Oral QAC breakfast   pantoprazole   40 mg Oral Daily   rosuvastatin   20 mg Oral Daily   sodium chloride  flush  10-40 mL Intracatheter Q12H   sodium chloride  flush  3 mL Intravenous Q12H   Continuous Infusions:  amiodarone  30 mg/hr (10/06/24 1140)   DOBUTamine 2.5 mcg/kg/min (10/06/24 0602)    Nutritional status     Body mass index is 31.99 kg/m.  Data Reviewed:   CBC: Recent Labs  Lab 09/30/24 0442 10/03/24 0640 10/04/24 0530 10/05/24 0340  10/06/24 0416  WBC 8.0 7.5 7.8 8.0 8.0  HGB 12.5 11.4* 12.2 12.2 11.9*  HCT 39.4 36.5 40.3 39.3 38.6  MCV 83.8 85.3 86.3 85.4 86.2  PLT 206 178 197 195 194   Basic Metabolic Panel: Recent Labs  Lab 09/29/24 2110 09/30/24 0442 10/01/24 0229 10/01/24 0253 10/02/24 0245 10/03/24 0640 10/04/24 0530 10/05/24 0340 10/06/24 0416  NA  --  138   < >  --  137 138 138 135 133*  K  --  3.6   < >  --  3.6 3.5 4.1 4.1 3.9  CL  --  94*   < >  --  100 101 101 99 95*  CO2  --  28   < >  --  26 26 26 27 26   GLUCOSE  --  76   < >  --  110* 115* 105* 103* 135*  BUN  --  48*   < >  --  46* 32* 33* 30* 29*  CREATININE  --  2.49*   < >  --  2.26* 1.65* 1.87* 1.74* 1.72*  CALCIUM   --  8.5*   < >  --  8.4* 8.0* 8.5* 8.4* 8.1*  MG 2.4  --   --  2.4  --  2.2 2.1 2.0  --   PHOS  --  5.8*  --   --  3.6 2.8 3.0 2.9  --    < > = values in this interval not displayed.   GFR: Estimated Creatinine Clearance: 29.9 mL/min (A) (by C-G formula based on SCr of 1.72 mg/dL (H)). Liver Function Tests: Recent Labs  Lab 09/30/24 0442  AST 22  ALT 20  ALKPHOS 42  BILITOT 0.7  PROT 5.7*  ALBUMIN 3.2*   No results for input(s): LIPASE, AMYLASE in the last 168 hours. Recent Labs  Lab 09/29/24 1304  AMMONIA 19   Coagulation Profile: No results for input(s): INR, PROTIME in the last 168 hours. Cardiac Enzymes: No results for input(s): CKTOTAL, CKMB, CKMBINDEX, TROPONINI in the last 168 hours. BNP (last 3 results) Recent Labs    08/20/24 1347  PROBNP 12,768*   HbA1C: No results for input(s): HGBA1C in the last 72 hours. CBG: Recent Labs  Lab 09/30/24 1346 10/01/24 0449 10/01/24 1120 10/01/24 2050 10/02/24 0434  GLUCAP 104* 74 89 116* 101*   Lipid Profile: No results for input(s): CHOL, HDL, LDLCALC, TRIG, CHOLHDL, LDLDIRECT in the last 72 hours. Thyroid  Function Tests: Recent Labs    10/04/24 0530 10/05/24 0340  TSH 6.389*  --   FREET4  --  0.96   Anemia  Panel: No results for input(s): VITAMINB12, FOLATE, FERRITIN, TIBC, IRON, RETICCTPCT in the last 72 hours. Sepsis Labs: Recent Labs  Lab 10/01/24 1240 10/02/24 0942 10/03/24 0801 10/04/24 0540  LATICACIDVEN 4.2* 1.4 1.1 0.8    Recent Results (from the past 240 hours)  Urine Culture     Status: Abnormal   Collection Time: 09/29/24  3:16 PM   Specimen: Urine, Clean Catch  Result Value Ref Range Status   Specimen Description URINE, CLEAN CATCH  Final   Special Requests Normal  Final   Culture (A)  Final    <10,000 COLONIES/mL INSIGNIFICANT GROWTH Performed at [facility], [address], [place], [place] [address]    Report Status 10/01/2024 FINAL  Final  Respiratory (~20 pathogens) panel by PCR     Status: None   Collection Time: 09/29/24  4:51 PM   Specimen: Nasopharyngeal Swab; Respiratory  Result Value Ref Range Status   Adenovirus NOT DETECTED NOT DETECTED Final   Coronavirus 229E NOT DETECTED NOT DETECTED Final    Comment: (NOTE) The Coronavirus on the Respiratory Panel, DOES NOT test for the novel  Coronavirus (2019 nCoV)    Coronavirus HKU1 NOT DETECTED NOT DETECTED Final   Coronavirus NL63 NOT DETECTED NOT DETECTED Final   Coronavirus OC43 NOT DETECTED NOT DETECTED Final   Metapneumovirus NOT DETECTED NOT DETECTED Final   Rhinovirus / Enterovirus NOT DETECTED NOT DETECTED Final   Influenza A NOT DETECTED NOT DETECTED Final   Influenza B NOT DETECTED NOT DETECTED Final   Parainfluenza Virus 1 NOT DETECTED NOT DETECTED Final   Parainfluenza Virus 2 NOT DETECTED NOT DETECTED Final   Parainfluenza Virus 3 NOT DETECTED NOT DETECTED Final   Parainfluenza Virus 4 NOT DETECTED NOT DETECTED Final   Respiratory Syncytial Virus NOT DETECTED NOT DETECTED Final   Bordetella pertussis NOT  DETECTED NOT DETECTED Final   Bordetella Parapertussis NOT DETECTED NOT DETECTED Final   Chlamydophila pneumoniae NOT DETECTED NOT DETECTED Final   Mycoplasma pneumoniae  NOT DETECTED NOT DETECTED Final    Comment: Performed at [facility], [address], [place], [place] [address]  Blood culture (routine x 2)     Status: None   Collection Time: 09/29/24  8:56 PM   Specimen: BLOOD  Result Value Ref Range Status   Specimen Description BLOOD SITE NOT SPECIFIED  Final   Special Requests   Final    BOTTLES DRAWN AEROBIC ONLY Blood Culture results may not be optimal due to an inadequate volume of blood received in culture bottles   Culture   Final    NO GROWTH 5 DAYS Performed at [facility], [address], [place], [place] [address]    Report Status 10/04/2024 FINAL  Final  Blood culture (routine x 2)     Status: None   Collection Time: 09/29/24  9:00 PM   Specimen: BLOOD RIGHT WRIST  Result Value Ref Range Status   Specimen Description BLOOD RIGHT WRIST  Final   Special Requests   Final    BOTTLES DRAWN AEROBIC ONLY Blood Culture results may not be optimal due to an inadequate volume of blood received in culture bottles   Culture   Final    NO GROWTH 5 DAYS Performed at [facility], [address], [place], [place] [address]    Report Status 10/04/2024 FINAL  Final         Radiology Studies: No results found.         LOS: 7 days   Time spent= 35 mins    Burgess JAYSON Dare, MD [facility]  If 7PM-7AM, please contact night-coverage  10/06/2024, 12:03 PM

## 2024-10-06 NOTE — Plan of Care (Signed)

## 2024-10-06 NOTE — TOC Progression Note (Addendum)
 Transition of Care [facility]) - Progression Note    Patient Details  Name: GWENDOLIN BRIEL MRN: [id number] Date of Birth: 02/01/1946  [facility] CM/SW Contact  Waddell Barnie Rama, RN Phone Number: 10/06/2024, 11:30 AM  Clinical Narrative:    Patient will have family meeting for goals of care today.                       Expected Discharge Plan and Services                                               Social Drivers of Health (SDOH) Interventions SDOH Screenings   Food Insecurity: No Food Insecurity (09/30/2024)  Housing: Low Risk  (09/30/2024)  Transportation Needs: No Transportation Needs (09/30/2024)  Utilities: Not At Risk (09/30/2024)  Social Connections: Moderately Integrated (09/30/2024)  Tobacco Use: Low Risk  (09/29/2024)    Readmission Risk Interventions    09/30/2024    3:22 PM  Readmission Risk Prevention Plan  Transportation Screening Complete  Home Care Screening Complete  Medication Review (RN CM) Complete

## 2024-10-06 NOTE — Progress Notes (Signed)
 Rounding Note    Patient Name: KIERRA JEZEWSKI Date of Encounter: 10/06/2024  [facility] Cardiologist: Newman JINNY Lawrence, MD   Subjective   We had a family meeting today with patient, her husband, and her daughter physically present in the room with us , as well as patient's son present by phone.  Patient's primary cardiologist Dr. Lawrence also present by phone during meeting.  We discussed current clinical situation at length, expected trajectory, and options for management.  We discussed that options include continuing with all medication as tolerated and planning to return to ER/hospital if severe symptoms recur (option 1), going home with a focus on symptom management and trying to avoid rehospitalization, but not completely stopping medical therapy and potentially considering home inotropes (option 2), or going home with hospice care focused on management of symptoms and avoidance of rehospitalization.  We had an extensive discussion about what these different pathways might entail and the services available to assist with each.  Patient and her family shared their thoughts and concerns, with daughter in particular being concerned about what to do if she has a recurrent episode.  Daughter notes that she typically does not have symptoms of shortness of breath or swelling before an acute heart failure exacerbation, but rather has altered mental status as her first sign.  We discussed how these 3 different options could potentially handle symptoms and what that might look like.  After conversation, decision was made to have family speak with hospice team to discuss services in more detail.  They are leaning towards home with hospice but are nervous about stopping medications.  While they are making these decisions, we will slowly wean the dobutamine and monitor to see if patient's symptoms change.  We discussed that if symptoms are not different off of the inotrope, then we would not  consider going home with palliative inotropes as it would not make her feel better.  Inpatient Medications    Scheduled Meds:  apixaban   5 mg Oral BID   Chlorhexidine  Gluconate Cloth  6 each Topical Daily   furosemide   40 mg Intravenous BID   gabapentin  100 mg Oral BID   levothyroxine   125 mcg Oral QAC breakfast   pantoprazole   40 mg Oral Daily   rosuvastatin   20 mg Oral Daily   sodium chloride  flush  10-40 mL Intracatheter Q12H   sodium chloride  flush  3 mL Intravenous Q12H   Continuous Infusions:  amiodarone  30 mg/hr (10/06/24 1800)   DOBUTamine 1.25 mcg/kg/min (10/06/24 1800)   PRN Meds: acetaminophen  **OR** acetaminophen , glucagon (human recombinant), guaiFENesin , hydrALAZINE , ipratropium-albuterol , sodium chloride  flush   Vital Signs    Vitals:   10/06/24 0500 10/06/24 1300 10/06/24 1330 10/06/24 1525  BP:   (!) 102/55 96/60  Pulse:  97 97 83  Resp:   19 15  Temp:   (!) 97.4 F (36.3 C) (!) 97.5 F (36.4 C)  TempSrc:  Oral Oral Oral  SpO2:    (!) 84%  Weight: 89.9 kg     Height:        Intake/Output Summary (Last 24 hours) at 10/06/2024 1909 Last data filed at 10/06/2024 1800 Gross per 24 hour  Intake 1509.99 ml  Output 2150 ml  Net -640.01 ml      10/06/2024    5:00 AM 10/05/2024    5:00 AM 10/02/2024    5:00 AM  Last 3 Weights  Weight (lbs) 198 lb 3.1 oz 195 lb 6.4 oz 191 lb  9.6 oz  Weight (kg) 89.9 kg 88.633 kg 86.909 kg      Telemetry    Atrial fibrillation, rates elevated but down just slightly from yesterday - Personally Reviewed  Physical Exam   GEN: Well nourished, well developed in no acute distress NECK: No JVD CARDIAC: irregularly irregular rhythm, normal S1 and S2, no rubs or gallops. 2/6 systolic murmur. VASCULAR: Radial pulses 2+ bilaterally.  RESPIRATORY:  Clear to auscultation without rales, wheezing or rhonchi  ABDOMEN: Soft, non-tender, non-distended MUSCULOSKELETAL:  Moves all 4 limbs independently SKIN: Warm and dry, no  edema NEUROLOGIC:  No focal neuro deficits noted. PSYCHIATRIC:  Normal affect    New pertinent results (labs, ECG, imaging, cardiac studies)     Assessment & Plan    Cardiogenic shock requiring inotropes Lactic acidosis Acute on chronic systolic and diastolic heart failure Acute kidney injury on chronic kidney disease stage 3b -LVEF 20-25%, down from prior. Dilated LV, global hypokinesis. RV with mildly reduced function, RVSP 54 -Her lactate cleared with the start of dobutamine and she was briefly able to be on diuretics, but despite dobutamine she still had worsening renal function and diuretics had to be held -see discussion re: inotropes from 11/3  -See summary in subjective portion regarding family meeting held today -not a candidate for advanced therapies -SGLT2i, beta blocker on hold given AKI and shock -no ARB/ARNI/MRA with low blood pressure and AKI -difficult to determine overall diuresis; no daily weights, I/O are inaccurate  Acute left frontal lobe infarct Acute metabolic encephalopathy  -seen by neurology, ok to continue apixaban , consider change to pradaxa as outpatient -stroke not felt to be etiology of altered mental status  Permanent atrial fibrillation: metoprolol  held given low output. On apixaban . Difficult situation as dobutamine drives HR up. No digoxin  given renal function. Slightly improved rates on IV amiodarone   Patient and family now making decisions about goals of care and are leaning towards home with hospice.  High complexity medical decision making, high risk of mortality given conditions. Total time at bedside with discussion was 50 minutes, see detailed points above.    Signed, Shelda Bruckner, MD  10/06/2024, 7:09 PM

## 2024-10-07 DIAGNOSIS — N179 Acute kidney failure, unspecified: Secondary | ICD-10-CM | POA: Diagnosis not present

## 2024-10-07 DIAGNOSIS — I5043 Acute on chronic combined systolic (congestive) and diastolic (congestive) heart failure: Secondary | ICD-10-CM | POA: Diagnosis not present

## 2024-10-07 DIAGNOSIS — Z7189 Other specified counseling: Secondary | ICD-10-CM | POA: Diagnosis not present

## 2024-10-07 DIAGNOSIS — R400 Somnolence: Secondary | ICD-10-CM | POA: Diagnosis not present

## 2024-10-07 DIAGNOSIS — R570 Cardiogenic shock: Secondary | ICD-10-CM | POA: Diagnosis not present

## 2024-10-07 LAB — CBC
HCT: 39.8 % (ref 36.0–46.0)
Hemoglobin: 12.5 g/dL (ref 12.0–15.0)
MCH: 26.6 pg (ref 26.0–34.0)
MCHC: 31.4 g/dL (ref 30.0–36.0)
MCV: 84.7 fL (ref 80.0–100.0)
Platelets: 174 K/uL (ref 150–400)
RBC: 4.7 MIL/uL (ref 3.87–5.11)
RDW: 17.4 % — ABNORMAL HIGH (ref 11.5–15.5)
WBC: 7.4 K/uL (ref 4.0–10.5)
nRBC: 0 % (ref 0.0–0.2)

## 2024-10-07 LAB — BASIC METABOLIC PANEL WITH GFR
Anion gap: 9 (ref 5–15)
BUN: 25 mg/dL — ABNORMAL HIGH (ref 8–23)
CO2: 30 mmol/L (ref 22–32)
Calcium: 8.5 mg/dL — ABNORMAL LOW (ref 8.9–10.3)
Chloride: 95 mmol/L — ABNORMAL LOW (ref 98–111)
Creatinine, Ser: 1.73 mg/dL — ABNORMAL HIGH (ref 0.44–1.00)
GFR, Estimated: 30 mL/min — ABNORMAL LOW (ref >60)
Glucose, Bld: 140 mg/dL — ABNORMAL HIGH (ref 70–99)
Potassium: 4.1 mmol/L (ref 3.5–5.1)
Sodium: 134 mmol/L — ABNORMAL LOW (ref 135–145)

## 2024-10-07 NOTE — Progress Notes (Signed)
 Rounding Note    Patient Name: [name] Date of Encounter: 10/07/2024  [facility] Cardiologist: Newman JINNY Lawrence, MD   Subjective   No worsening of symptoms, no shortness of breath. Reviewed family meeting from yesterday and plan for today, they are amenable, see below.  Inpatient Medications    Scheduled Meds:  apixaban   5 mg Oral BID   Chlorhexidine  Gluconate Cloth  6 each Topical Daily   furosemide   40 mg Intravenous BID   gabapentin  100 mg Oral BID   levothyroxine   125 mcg Oral QAC breakfast   pantoprazole   40 mg Oral Daily   rosuvastatin   20 mg Oral Daily   sodium chloride  flush  10-40 mL Intracatheter Q12H   sodium chloride  flush  3 mL Intravenous Q12H   Continuous Infusions:  amiodarone  30 mg/hr (10/07/24 0009)   PRN Meds: acetaminophen  **OR** acetaminophen , glucagon (human recombinant), guaiFENesin , hydrALAZINE , ipratropium-albuterol , sodium chloride  flush   Vital Signs    Vitals:   10/06/24 2001 10/07/24 0012 10/07/24 0352 10/07/24 0826  BP: 120/76 114/77 134/74 (!) 109/57  Pulse: 89 (!) 110 (!) 103 94  Resp: 19 20 17 18   Temp: 97.9 F (36.6 C) (!) 97.4 F (36.3 C) (!) 97.5 F (36.4 C) (!) 97.4 F (36.3 C)  TempSrc: Oral Oral Oral Oral  SpO2: 94% 96% 94% 94%  Weight:   89 kg   Height:        Intake/Output Summary (Last 24 hours) at 10/07/2024 1123 Last data filed at 10/07/2024 9171 Gross per 24 hour  Intake 704.05 ml  Output 650 ml  Net 54.05 ml      10/07/2024    3:52 AM 10/06/2024    5:00 AM 10/05/2024    5:00 AM  Last 3 Weights  Weight (lbs) 196 lb 3.4 oz 198 lb 3.1 oz 195 lb 6.4 oz  Weight (kg) 89 kg 89.9 kg 88.633 kg      Telemetry    Atrial fibrillation, rates improved compared to yesterday - Personally Reviewed  Physical Exam   GEN: Well nourished, well developed in no acute distress NECK: No JVD sitting upright in chair CARDIAC: irregularly irregular rhythm, normal S1 and S2, no rubs or gallops. 2/6  systolic murmur. VASCULAR: Radial pulses 2+ bilaterally.  RESPIRATORY:  Clear to auscultation without rales, wheezing or rhonchi  ABDOMEN: Soft, non-tender, non-distended MUSCULOSKELETAL:  Moves all 4 limbs independently SKIN: Warm and dry, no edema NEUROLOGIC:  No focal neuro deficits noted. PSYCHIATRIC:  Normal affect    New pertinent results (labs, ECG, imaging, cardiac studies)     Assessment & Plan    Cardiogenic shock requiring inotropes Lactic acidosis Acute on chronic systolic and diastolic heart failure Acute kidney injury on chronic kidney disease stage 3b -LVEF 20-25%, down from prior. Dilated LV, global hypokinesis. RV with mildly reduced function, RVSP 54 -Her lactate cleared with the start of dobutamine and she was briefly able to be on diuretics, but despite dobutamine she still had worsening renal function and diuretics had to be held for a time. Now barely net even from yesterday despite BID IV lasix .  -decreased dobutamine yesterday, no change in symptoms. Stopping today. -SGLT2i, beta blocker on hold given AKI and shock -no ARB/ARNI/MRA with low blood pressure and AKI -see overall plan below  Acute left frontal lobe infarct Acute metabolic encephalopathy  -seen by neurology, ok to continue apixaban , consider change to pradaxa as outpatient -stroke not felt to be etiology of altered  mental status  Permanent atrial fibrillation: metoprolol  held given low output. On apixaban .  -HR elevated with start of dobutamine, IV amiodarone  added to manage this HR increase. HR improved with decreased dose of dobutamine yesterday, anticipate it will improve today with stop of dobutamine, and then we will stop IV amiodarone   Patient and family now making decisions about goals of care and are leaning towards home with hospice. They plan to speak with hospice today. Stopping dobutamine and amiodarone  today. If symptoms stable tomorrow morning and there is a plan for home, may be able  to be discharged.  High complexity medical decision making, high risk of mortality given conditions.     Signed, Shelda Bruckner, MD  10/07/2024, 11:23 AM

## 2024-10-07 NOTE — Progress Notes (Signed)
 PROGRESS NOTE    TAWAN CORKERN  FMW:[id number] DOB: 1946/08/24 DOA: 09/29/2024 PCP: Onita Rush, MD    Brief Narrative:   78 year old female followed by Dr. Elmira with a history of nonischemic cardiomyopathy, pericardial effusion status post pericardiocentesis (01/2021) , HFrEF, nonobstructive coronary disease and permanent A-fib on Eliquis  along with aortic stenosis and stage IIIb chronic kidney disease. She was recently seen for preoperative clearance for cataract surgery and had reported some dizziness and symptoms suggesting possible overdiuresis. Her diuretics were reduced. She also has a history of hyponatremia. LVEF was 30 to 35%. She now presents over the past 5 days with increasing agitation, lower extremity edema, shortness of breath and visual and auditory hallucinations. She had a similar episode in November 2024. Her PCP diagnosed her with a UTI few days ago and she was started on antibiotics but not improving. In the emergency department she was noted to be in A-fib with heart rate of 90, creatinine was elevated 2.55 above her baseline of 1.5. BNP was elevated at 762 and lactate was 2.2. Chest x-ray showed scarring and possible infiltrate. CT chest showed moderate right and small left pleural effusions with underlying chronic interstitial lung disease. Urinalysis showed nitrite and leukoesterase negative but positive for rare bacteria and she was continued on IV ceftriaxone .   On admission found to have acute CVA on MRI, seen by neurology who recommended continuing Eliquis  as rest of the workup was unremarkable.  Cardiology has been following the patient for diuresis.  Assessment & Plan:     Acute on chronic systolic CHF exacerbation, EF 74% TTE shows LVEF 20-25%.  Continue Unna boots.  Not a good candidate for advanced therapies.  GDMT/diuresis per cardiology team.  Permanent atrial fibrillation - Slowly hoping to wean off amiodarone .  I think rates will be better controlled  when patient comes off of dobutamine drip.  Continue Eliquis .  Acute metabolic encephalopathy Suspect possibly secondary to acute CVA versus recent infection?  Ammonia levels are normal.  Urine culture here shows insignificant growth   Acute ischemic stroke:  Due to encephalopathy of unknown source, patient ended up having CT head which was unremarkable followed by MRI brain which showed Punctate acute ischemic nonhemorrhagic infarct in the high posterior left frontal lobe, precentral gyrus.  A1c 6.0, LDL 49.  Carotid Dopplers, MRI brain and EEG are unremarkable.  Neurology recommending to continue Eliquis         Asymptomatic bacteriuria  Patient afebrile, no leukocytosis, UA not impressive for UTI.  I do not think this is true UTI, discontinued antibiotics.   AKI on CKD stage IIIb  Baseline creatinine 1.5 but presented with 2.5.  Slowly improving and stabilizing with diuresis    Hypoglycemia  No prior history of diabetes, blood sugar yesterday morning was 58, hemoglobin A1c 6.0.  Likely due to poor p.o. intake.       Acquired hypothyroidism  Continue Synthroid .     Body mass index is 30.93 kg/m.   Ongoing goals of care discussion.  Currently leaning towards going home with hospice   DVT prophylaxis: apixaban  (ELIQUIS ) tablet 5 mg      Code Status: Limited: Do not attempt resuscitation (DNR) -DNR-LIMITED -Do Not Intubate/DNI  Family Communication: Family at bedside Status is: Inpatient Remains inpatient appropriate because: On going diuresis per cardiology.    PT Follow up Recs:   Subjective: Patient seen at bedside no complaints.  Agreeable to discuss care with hospice.  Husband is also present at bedside. Examination:  General exam: Appears calm and comfortable  Respiratory system: Clear to auscultation. Respiratory effort normal. Cardiovascular system: Tachycardia, irregularly irregular.  3+ bilateral lower extremity pitting edema Gastrointestinal system: Abdomen  is nondistended, soft and nontender. No organomegaly or masses felt. Normal bowel sounds heard. Central nervous system: Alert and oriented. No focal neurological deficits. Extremities: Symmetric 5 x 5 power. Skin: No rashes, lesions or ulcers Psychiatry: Judgement and insight appear normal. Mood & affect appropriate.                Diet Orders (From admission, onward)     Start     Ordered   09/30/24 0817  Diet Heart Room service appropriate? Yes; Fluid consistency: Thin  Diet effective now       Question Answer Comment  Room service appropriate? Yes   Fluid consistency: Thin      09/30/24 0816            Objective: Vitals:   10/07/24 0012 10/07/24 0352 10/07/24 0826 10/07/24 1132  BP: 114/77 134/74 (!) 109/57 109/68  Pulse: (!) 110 (!) 103 94 87  Resp: 20 17 18    Temp: (!) 97.4 F (36.3 C) (!) 97.5 F (36.4 C) (!) 97.4 F (36.3 C) (!) 97.3 F (36.3 C)  TempSrc: Oral Oral Oral Oral  SpO2: 96% 94% 94%   Weight:  89 kg    Height:        Intake/Output Summary (Last 24 hours) at 10/07/2024 1211 Last data filed at 10/07/2024 9171 Gross per 24 hour  Intake 704.05 ml  Output 650 ml  Net 54.05 ml   Filed Weights   10/05/24 0500 10/06/24 0500 10/07/24 0352  Weight: 88.6 kg 89.9 kg 89 kg    Scheduled Meds:  apixaban   5 mg Oral BID   Chlorhexidine  Gluconate Cloth  6 each Topical Daily   furosemide   40 mg Intravenous BID   gabapentin  100 mg Oral BID   levothyroxine   125 mcg Oral QAC breakfast   pantoprazole   40 mg Oral Daily   rosuvastatin   20 mg Oral Daily   sodium chloride  flush  10-40 mL Intracatheter Q12H   sodium chloride  flush  3 mL Intravenous Q12H   Continuous Infusions:  amiodarone  30 mg/hr (10/07/24 0009)    Nutritional status     Body mass index is 31.67 kg/m.  Data Reviewed:   CBC: Recent Labs  Lab 10/03/24 0640 10/04/24 0530 10/05/24 0340 10/06/24 0416 10/07/24 0500  WBC 7.5 7.8 8.0 8.0 7.4  HGB 11.4* 12.2 12.2 11.9* 12.5   HCT 36.5 40.3 39.3 38.6 39.8  MCV 85.3 86.3 85.4 86.2 84.7  PLT 178 197 195 194 174   Basic Metabolic Panel: Recent Labs  Lab 10/01/24 0253 10/02/24 0245 10/03/24 0640 10/04/24 0530 10/05/24 0340 10/06/24 0416 10/07/24 0500  NA  --  137 138 138 135 133* 134*  K  --  3.6 3.5 4.1 4.1 3.9 4.1  CL  --  100 101 101 99 95* 95*  CO2  --  26 26 26 27 26 30   GLUCOSE  --  110* 115* 105* 103* 135* 140*  BUN  --  46* 32* 33* 30* 29* 25*  CREATININE  --  2.26* 1.65* 1.87* 1.74* 1.72* 1.73*  CALCIUM   --  8.4* 8.0* 8.5* 8.4* 8.1* 8.5*  MG 2.4  --  2.2 2.1 2.0  --   --   PHOS  --  3.6 2.8 3.0 2.9  --   --  GFR: Estimated Creatinine Clearance: 29.6 mL/min (A) (by C-G formula based on SCr of 1.73 mg/dL (H)). Liver Function Tests: No results for input(s): AST, ALT, ALKPHOS, BILITOT, PROT, ALBUMIN in the last 168 hours. No results for input(s): LIPASE, AMYLASE in the last 168 hours. No results for input(s): AMMONIA in the last 168 hours. Coagulation Profile: No results for input(s): INR, PROTIME in the last 168 hours. Cardiac Enzymes: No results for input(s): CKTOTAL, CKMB, CKMBINDEX, TROPONINI in the last 168 hours. BNP (last 3 results) Recent Labs    08/20/24 1347  PROBNP 12,768*   HbA1C: No results for input(s): HGBA1C in the last 72 hours. CBG: Recent Labs  Lab 09/30/24 1346 10/01/24 0449 10/01/24 1120 10/01/24 2050 10/02/24 0434  GLUCAP 104* 74 89 116* 101*   Lipid Profile: No results for input(s): CHOL, HDL, LDLCALC, TRIG, CHOLHDL, LDLDIRECT in the last 72 hours. Thyroid  Function Tests: Recent Labs    10/05/24 0340  FREET4 0.96   Anemia Panel: No results for input(s): VITAMINB12, FOLATE, FERRITIN, TIBC, IRON, RETICCTPCT in the last 72 hours. Sepsis Labs: Recent Labs  Lab 10/01/24 1240 10/02/24 0942 10/03/24 0801 10/04/24 0540  LATICACIDVEN 4.2* 1.4 1.1 0.8    Recent Results (from the past 240 hours)   Urine Culture     Status: Abnormal   Collection Time: 09/29/24  3:16 PM   Specimen: Urine, Clean Catch  Result Value Ref Range Status   Specimen Description URINE, CLEAN CATCH  Final   Special Requests Normal  Final   Culture (A)  Final    <10,000 COLONIES/mL INSIGNIFICANT GROWTH Performed at [facility], [address], [place], [place] [address]    Report Status 10/01/2024 FINAL  Final  Respiratory (~20 pathogens) panel by PCR     Status: None   Collection Time: 09/29/24  4:51 PM   Specimen: Nasopharyngeal Swab; Respiratory  Result Value Ref Range Status   Adenovirus NOT DETECTED NOT DETECTED Final   Coronavirus 229E NOT DETECTED NOT DETECTED Final    Comment: (NOTE) The Coronavirus on the Respiratory Panel, DOES NOT test for the novel  Coronavirus (2019 nCoV)    Coronavirus HKU1 NOT DETECTED NOT DETECTED Final   Coronavirus NL63 NOT DETECTED NOT DETECTED Final   Coronavirus OC43 NOT DETECTED NOT DETECTED Final   Metapneumovirus NOT DETECTED NOT DETECTED Final   Rhinovirus / Enterovirus NOT DETECTED NOT DETECTED Final   Influenza A NOT DETECTED NOT DETECTED Final   Influenza B NOT DETECTED NOT DETECTED Final   Parainfluenza Virus 1 NOT DETECTED NOT DETECTED Final   Parainfluenza Virus 2 NOT DETECTED NOT DETECTED Final   Parainfluenza Virus 3 NOT DETECTED NOT DETECTED Final   Parainfluenza Virus 4 NOT DETECTED NOT DETECTED Final   Respiratory Syncytial Virus NOT DETECTED NOT DETECTED Final   Bordetella pertussis NOT DETECTED NOT DETECTED Final   Bordetella Parapertussis NOT DETECTED NOT DETECTED Final   Chlamydophila pneumoniae NOT DETECTED NOT DETECTED Final   Mycoplasma pneumoniae NOT DETECTED NOT DETECTED Final    Comment: Performed at [facility], [address], [place], [place] [address]  Blood culture (routine x 2)     Status: None   Collection Time: 09/29/24  8:56 PM   Specimen: BLOOD  Result Value Ref Range Status   Specimen Description BLOOD SITE  NOT SPECIFIED  Final   Special Requests   Final    BOTTLES DRAWN AEROBIC ONLY Blood Culture results may not be optimal due to an inadequate volume  of blood received in culture bottles   Culture   Final    NO GROWTH 5 DAYS Performed at [facility], [address] [place], [place] [address]    Report Status 10/04/2024 FINAL  Final  Blood culture (routine x 2)     Status: None   Collection Time: 09/29/24  9:00 PM   Specimen: BLOOD RIGHT WRIST  Result Value Ref Range Status   Specimen Description BLOOD RIGHT WRIST  Final   Special Requests   Final    BOTTLES DRAWN AEROBIC ONLY Blood Culture results may not be optimal due to an inadequate volume of blood received in culture bottles   Culture   Final    NO GROWTH 5 DAYS Performed at [facility], [address], [place], [place] [address]    Report Status 10/04/2024 FINAL  Final         Radiology Studies: No results found.         LOS: 8 days   Time spent= 35 mins    Burgess JAYSON Dare, MD [facility]  If 7PM-7AM, please contact night-coverage  10/07/2024, 12:11 PM

## 2024-10-07 NOTE — Progress Notes (Signed)
   Referral received to speak with family and pt about hospice care at home. The chart has been reviewed and the pt has been acceptd for admission to hospice care at home.   I have reached out to the daughter [name] Marc NEIGHBOURS left VM for return call to meet and or discuss hospice care at home.   I did order equipment of hospital bed and oxygen which [facility] tried to deliver this am and the daughter has refused it at this time reporting to [facility] that they are unsure if they are accepting hospice care at this time.   Magdalena Berber RN [phone]

## 2024-10-07 NOTE — Plan of Care (Signed)
 Patient has been notified her heart isn't faring terribly well, but is in good spirits despite her health challenges. Patient's partner has been at bedside today.

## 2024-10-08 ENCOUNTER — Other Ambulatory Visit (HOSPITAL_COMMUNITY): Payer: Self-pay

## 2024-10-08 ENCOUNTER — Other Ambulatory Visit: Payer: Self-pay | Admitting: Cardiology

## 2024-10-08 DIAGNOSIS — I5043 Acute on chronic combined systolic (congestive) and diastolic (congestive) heart failure: Secondary | ICD-10-CM | POA: Diagnosis not present

## 2024-10-08 DIAGNOSIS — R570 Cardiogenic shock: Secondary | ICD-10-CM | POA: Diagnosis not present

## 2024-10-08 DIAGNOSIS — Z7189 Other specified counseling: Secondary | ICD-10-CM | POA: Diagnosis not present

## 2024-10-08 DIAGNOSIS — N179 Acute kidney failure, unspecified: Secondary | ICD-10-CM | POA: Diagnosis not present

## 2024-10-08 DIAGNOSIS — R400 Somnolence: Secondary | ICD-10-CM | POA: Diagnosis not present

## 2024-10-08 LAB — BASIC METABOLIC PANEL WITH GFR
Anion gap: 11 (ref 5–15)
BUN: 25 mg/dL — ABNORMAL HIGH (ref 8–23)
CO2: 28 mmol/L (ref 22–32)
Calcium: 8.3 mg/dL — ABNORMAL LOW (ref 8.9–10.3)
Chloride: 92 mmol/L — ABNORMAL LOW (ref 98–111)
Creatinine, Ser: 1.74 mg/dL — ABNORMAL HIGH (ref 0.44–1.00)
GFR, Estimated: 29 mL/min — ABNORMAL LOW (ref >60)
Glucose, Bld: 93 mg/dL (ref 70–99)
Potassium: 4.2 mmol/L (ref 3.5–5.1)
Sodium: 131 mmol/L — ABNORMAL LOW (ref 135–145)

## 2024-10-08 MED ORDER — TORSEMIDE 20 MG PO TABS
20.0000 mg | ORAL_TABLET | Freq: Two times a day (BID) | ORAL | 0 refills | Status: DC
  Filled 2024-10-08: qty 60, 30d supply, fill #0

## 2024-10-08 MED ORDER — TORSEMIDE 20 MG PO TABS
20.0000 mg | ORAL_TABLET | Freq: Two times a day (BID) | ORAL | Status: DC
  Administered 2024-10-08 – 2024-10-09 (×2): 20 mg via ORAL
  Filled 2024-10-08 (×2): qty 1

## 2024-10-08 NOTE — Plan of Care (Signed)

## 2024-10-08 NOTE — Progress Notes (Signed)
 Orthopedic Tech Progress Note Patient Details:  KAMICA FLORANCE 20-Jan-1946 [id number] Removed previous UB. Called RN in to do skin assessment. Once she said the patient's legs looked good, UB were applied.  Ortho Devices Type of Ortho Device: Radio broadcast assistant Ortho Device/Splint Location: BLE Ortho Device/Splint Interventions: Ordered, Application, Adjustment   Post Interventions Patient Tolerated: Well Instructions Provided: Care of device  Jordani Nunn L Lakara Weiland 10/08/2024, 6:04 AM

## 2024-10-08 NOTE — Progress Notes (Signed)
 Rounding Note    Patient Name: [name] Date of Encounter: 10/08/2024  [facility] Cardiologist: Newman JINNY Lawrence, MD   Subjective   Patient had an episode between 2-3 AM where she woke up and felt short of breath. She felt her heart racing--reviewed telemetry, has been in afib ~100 bpm consistently. Daughter at bedside. They spoke to a hospice service and would like to go home with hospice tomorrow.   Inpatient Medications    Scheduled Meds:  apixaban   5 mg Oral BID   Chlorhexidine  Gluconate Cloth  6 each Topical Daily   gabapentin  100 mg Oral BID   levothyroxine   125 mcg Oral QAC breakfast   pantoprazole   40 mg Oral Daily   rosuvastatin   20 mg Oral Daily   sodium chloride  flush  10-40 mL Intracatheter Q12H   sodium chloride  flush  3 mL Intravenous Q12H   torsemide  20 mg Oral BID   Continuous Infusions:   PRN Meds: acetaminophen  **OR** acetaminophen , glucagon (human recombinant), guaiFENesin , hydrALAZINE , ipratropium-albuterol , sodium chloride  flush   Vital Signs    Vitals:   10/08/24 0353 10/08/24 0355 10/08/24 0415 10/08/24 0755  BP: 104/78   94/68  Pulse: 95   95  Resp: (!) 25 (!) 22  (!) 22  Temp: (!) 97.5 F (36.4 C) (!) 97.5 F (36.4 C)  (!) 97.5 F (36.4 C)  TempSrc: Oral   Oral  SpO2: 95%   94%  Weight:   89 kg   Height:        Intake/Output Summary (Last 24 hours) at 10/08/2024 1329 Last data filed at 10/08/2024 0955 Gross per 24 hour  Intake 540 ml  Output 1250 ml  Net -710 ml      10/08/2024    4:15 AM 10/07/2024    3:52 AM 10/06/2024    5:00 AM  Last 3 Weights  Weight (lbs) 196 lb 3.4 oz 196 lb 3.4 oz 198 lb 3.1 oz  Weight (kg) 89 kg 89 kg 89.9 kg      Telemetry    Atrial fibrillation, rates ~90s- Personally Reviewed  Physical Exam   GEN: Well nourished, well developed in no acute distress NECK: No JVD sitting upright in chair CARDIAC: irregularly irregular rhythm, normal S1 and S2, no rubs or gallops. 2/6  systolic murmur. VASCULAR: Radial pulses 2+ bilaterally.  RESPIRATORY:  Clear to auscultation without rales, wheezing or rhonchi  ABDOMEN: Soft, non-tender, non-distended MUSCULOSKELETAL:  Moves all 4 limbs independently SKIN: Warm and dry, no significant pitting LE edema NEUROLOGIC:  No focal neuro deficits noted. PSYCHIATRIC:  Normal affect    New pertinent results (labs, ECG, imaging, cardiac studies)     Assessment & Plan    Goals of care: please see my notes from earlier this week. After daily family meetings, plan is to go home with hospice tomorrow. They are calling to make sure home O2 can be present when they are home.   We spent extensive time today about planning for what that will look like. Ran through a situation like the event she had this morning--what would it look like if she woke up short of breath at home? Reviewed how she might try to use oxygen, or use some PRN medication from hospice, and then if no relief can call the 24 hour hospice line. Discussed how this differs than how they would manage in the past, with 911 call and ER.  Discussed that we will use medications to focus  on symptoms. Diuretics fit in this category. But I would not recommend restarting GDMT (has been on hold due to AKI and hypotension). Long term, would consider stopping rosuvastatin  and even apixaban   Cardiogenic shock requiring inotropes Lactic acidosis Acute on chronic systolic and diastolic heart failure Acute kidney injury on chronic kidney disease stage 3b -LVEF 20-25%, down from prior. Dilated LV, global hypokinesis. RV with mildly reduced function, RVSP 54 -Her lactate cleared with the start of dobutamine and she was briefly able to be on diuretics, but despite dobutamine she still had worsening renal function and diuretics had to be held for a time.  -she feels no different now that we have weaned off dobutamine -SGLT2i, beta blocker stopped given shock and AKI -no ARB/ARNI/MRA with low  blood pressure and AKI, was also not able to be on these at home -home with hospice tomorrow  Acute left frontal lobe infarct Acute metabolic encephalopathy  -seen by neurology -continue apixaban  for now, but given plans for hospice, would consider stopping long term -stroke not felt to be etiology of altered mental status  Permanent atrial fibrillation: metoprolol  stopped given low output. On apixaban .  -HR around 90s off dobutamine and amiodarone   [facility] will sign off, given plans for home with hospice tomorrow Medication Recommendations:   DO NOT RESTART: empagliflozin , furosemide , isosorbide , metoprolol  START: torsemide 20 mg BID (replaces furosemide ) for fluid management CONTINUE FOR NOW: rosuvastatin , apixaban , but would consider stopping this after full evaluation with hospice Other recommendations (labs, testing, etc):  none Follow up as an outpatient:  will be followed by hospice     Signed, Shelda Bruckner, MD  10/08/2024, 1:29 PM

## 2024-10-08 NOTE — Progress Notes (Signed)
 Heart Failure Navigator Progress Note  Assessed for Heart & Vascular TOC clinic readiness.  Patient does not meet criteria due to Per MD note, .Ongoing goals of care discussion.  Currently leaning towards going home with hospice. No HF TOC.      Navigator will sign off at this time.   Stephane Haddock, BSN, Scientist, Clinical (histocompatibility And Immunogenetics) Only

## 2024-10-08 NOTE — Progress Notes (Signed)
 PROGRESS NOTE    [name]  FMW:[id number] DOB: December 24, 1945 DOA: 09/29/2024 PCP: Onita Rush, MD    Brief Narrative:   78 year old female followed by Dr. Elmira with a history of nonischemic cardiomyopathy, pericardial effusion status post pericardiocentesis (01/2021) , HFrEF, nonobstructive coronary disease and permanent A-fib on Eliquis  along with aortic stenosis and stage IIIb chronic kidney disease. She was recently seen for preoperative clearance for cataract surgery and had reported some dizziness and symptoms suggesting possible overdiuresis. Her diuretics were reduced. She also has a history of hyponatremia. LVEF was 30 to 35%. She now presents over the past 5 days with increasing agitation, lower extremity edema, shortness of breath and visual and auditory hallucinations. She had a similar episode in November 2024. Her PCP diagnosed her with a UTI few days ago and she was started on antibiotics but not improving. In the emergency department she was noted to be in A-fib with heart rate of 90, creatinine was elevated 2.55 above her baseline of 1.5. BNP was elevated at 762 and lactate was 2.2. Chest x-ray showed scarring and possible infiltrate. CT chest showed moderate right and small left pleural effusions with underlying chronic interstitial lung disease. Urinalysis showed nitrite and leukoesterase negative but positive for rare bacteria and she was continued on IV ceftriaxone .   On admission found to have acute CVA on MRI, seen by neurology who recommended continuing Eliquis  as rest of the workup was unremarkable.  Cardiology has been following the patient for diuresis.  Assessment & Plan:     Acute on chronic systolic CHF exacerbation, EF 74% TTE shows LVEF 20-25%.  Continue Unna boots.  Not a good candidate for advanced therapies.  GDMT/diuresis per cardiology team.  Dobutamine has been weaned off  Permanent atrial fibrillation - Rates are slightly better.  Now off dobutamine  and amiodarone  drip.  Acute metabolic encephalopathy Suspect possibly secondary to acute CVA versus recent infection?  Ammonia levels are normal.  Urine culture here shows insignificant growth   Acute ischemic stroke:  Due to encephalopathy of unknown source, patient ended up having CT head which was unremarkable followed by MRI brain which showed Punctate acute ischemic nonhemorrhagic infarct in the high posterior left frontal lobe, precentral gyrus.  A1c 6.0, LDL 49.  Carotid Dopplers, MRI brain and EEG are unremarkable.  Neurology recommending to continue Eliquis     Asymptomatic bacteriuria  Patient afebrile, no leukocytosis, UA not impressive for UTI.  I do not think this is true UTI, discontinued antibiotics.   AKI on CKD stage IIIb, improved  Baseline creatinine 1.5 but presented with 2.5.  Slowly improving and stabilizing with diuresis    Hypoglycemia, improved  No prior history of diabetes, blood sugar yesterday morning was 58, hemoglobin A1c 6.0.  Likely due to poor p.o. intake.       Acquired hypothyroidism  Continue Synthroid .  Normal free T4, slightly elevated TSH.  Repeat outpatient in 3 weeks     Body mass index is 30.93 kg/m.   Ongoing goals of care discussion.  Currently leaning towards going home with hospice   DVT prophylaxis: apixaban  (ELIQUIS ) tablet 5 mg      Code Status: Limited: Do not attempt resuscitation (DNR) -DNR-LIMITED -Do Not Intubate/DNI  Family Communication: Family at bedside Status is: Inpatient Remains inpatient appropriate because: Hoping for home tomorrow   PT Follow up Recs:   Subjective: Feeling weak but no other complaints at this time.  Reports overnight she had feeling of shortness of breath and  hypoxia   Examination:  General exam: Appears calm and comfortable  Respiratory system: Clear to auscultation. Respiratory effort normal. Cardiovascular system: Tachycardia, irregularly irregular.  3+ bilateral lower extremity pitting  edema Gastrointestinal system: Abdomen is nondistended, soft and nontender. No organomegaly or masses felt. Normal bowel sounds heard. Central nervous system: Alert and oriented. No focal neurological deficits. Extremities: Symmetric 5 x 5 power. Skin: No rashes, lesions or ulcers Psychiatry: Judgement and insight appear normal. Mood & affect appropriate.                Diet Orders (From admission, onward)     Start     Ordered   09/30/24 0817  Diet Heart Room service appropriate? Yes; Fluid consistency: Thin  Diet effective now       Question Answer Comment  Room service appropriate? Yes   Fluid consistency: Thin      09/30/24 0816            Objective: Vitals:   10/08/24 0353 10/08/24 0355 10/08/24 0415 10/08/24 0755  BP: 104/78   94/68  Pulse: 95   95  Resp: (!) 25 (!) 22  (!) 22  Temp: (!) 97.5 F (36.4 C) (!) 97.5 F (36.4 C)  (!) 97.5 F (36.4 C)  TempSrc: Oral   Oral  SpO2: 95%   94%  Weight:   89 kg   Height:        Intake/Output Summary (Last 24 hours) at 10/08/2024 1205 Last data filed at 10/08/2024 0955 Gross per 24 hour  Intake 540 ml  Output 1700 ml  Net -1160 ml   Filed Weights   10/06/24 0500 10/07/24 0352 10/08/24 0415  Weight: 89.9 kg 89 kg 89 kg    Scheduled Meds:  apixaban   5 mg Oral BID   Chlorhexidine  Gluconate Cloth  6 each Topical Daily   furosemide   40 mg Intravenous BID   gabapentin  100 mg Oral BID   levothyroxine   125 mcg Oral QAC breakfast   pantoprazole   40 mg Oral Daily   rosuvastatin   20 mg Oral Daily   sodium chloride  flush  10-40 mL Intracatheter Q12H   sodium chloride  flush  3 mL Intravenous Q12H   Continuous Infusions:  Nutritional status     Body mass index is 31.67 kg/m.  Data Reviewed:   CBC: Recent Labs  Lab 10/03/24 0640 10/04/24 0530 10/05/24 0340 10/06/24 0416 10/07/24 0500  WBC 7.5 7.8 8.0 8.0 7.4  HGB 11.4* 12.2 12.2 11.9* 12.5  HCT 36.5 40.3 39.3 38.6 39.8  MCV 85.3 86.3 85.4 86.2  84.7  PLT 178 197 195 194 174   Basic Metabolic Panel: Recent Labs  Lab 10/02/24 0245 10/03/24 0640 10/04/24 0530 10/05/24 0340 10/06/24 0416 10/07/24 0500 10/08/24 0525  NA 137 138 138 135 133* 134* 131*  K 3.6 3.5 4.1 4.1 3.9 4.1 4.2  CL 100 101 101 99 95* 95* 92*  CO2 26 26 26 27 26 30 28   GLUCOSE 110* 115* 105* 103* 135* 140* 93  BUN 46* 32* 33* 30* 29* 25* 25*  CREATININE 2.26* 1.65* 1.87* 1.74* 1.72* 1.73* 1.74*  CALCIUM  8.4* 8.0* 8.5* 8.4* 8.1* 8.5* 8.3*  MG  --  2.2 2.1 2.0  --   --   --   PHOS 3.6 2.8 3.0 2.9  --   --   --    GFR: Estimated Creatinine Clearance: 29.5 mL/min (A) (by C-G formula based on SCr of 1.74 mg/dL (H)). Liver Function  Tests: No results for input(s): AST, ALT, ALKPHOS, BILITOT, PROT, ALBUMIN in the last 168 hours. No results for input(s): LIPASE, AMYLASE in the last 168 hours. No results for input(s): AMMONIA in the last 168 hours. Coagulation Profile: No results for input(s): INR, PROTIME in the last 168 hours. Cardiac Enzymes: No results for input(s): CKTOTAL, CKMB, CKMBINDEX, TROPONINI in the last 168 hours. BNP (last 3 results) Recent Labs    08/20/24 1347  PROBNP 12,768*   HbA1C: No results for input(s): HGBA1C in the last 72 hours. CBG: Recent Labs  Lab 10/01/24 2050 10/02/24 0434  GLUCAP 116* 101*   Lipid Profile: No results for input(s): CHOL, HDL, LDLCALC, TRIG, CHOLHDL, LDLDIRECT in the last 72 hours. Thyroid  Function Tests: No results for input(s): TSH, T4TOTAL, FREET4, T3FREE, THYROIDAB in the last 72 hours. Anemia Panel: No results for input(s): VITAMINB12, FOLATE, FERRITIN, TIBC, IRON, RETICCTPCT in the last 72 hours. Sepsis Labs: Recent Labs  Lab 10/02/24 0942 10/03/24 0801 10/04/24 0540  LATICACIDVEN 1.4 1.1 0.8    Recent Results (from the past 240 hours)  Urine Culture     Status: Abnormal   Collection Time: 09/29/24  3:16 PM   Specimen:  Urine, Clean Catch  Result Value Ref Range Status   Specimen Description URINE, CLEAN CATCH  Final   Special Requests Normal  Final   Culture (A)  Final    <10,000 COLONIES/mL INSIGNIFICANT GROWTH Performed at [facility], [address] [place], [place] [address]    Report Status 10/01/2024 FINAL  Final  Respiratory (~20 pathogens) panel by PCR     Status: None   Collection Time: 09/29/24  4:51 PM   Specimen: Nasopharyngeal Swab; Respiratory  Result Value Ref Range Status   Adenovirus NOT DETECTED NOT DETECTED Final   Coronavirus 229E NOT DETECTED NOT DETECTED Final    Comment: (NOTE) The Coronavirus on the Respiratory Panel, DOES NOT test for the novel  Coronavirus (2019 nCoV)    Coronavirus HKU1 NOT DETECTED NOT DETECTED Final   Coronavirus NL63 NOT DETECTED NOT DETECTED Final   Coronavirus OC43 NOT DETECTED NOT DETECTED Final   Metapneumovirus NOT DETECTED NOT DETECTED Final   Rhinovirus / Enterovirus NOT DETECTED NOT DETECTED Final   Influenza A NOT DETECTED NOT DETECTED Final   Influenza B NOT DETECTED NOT DETECTED Final   Parainfluenza Virus 1 NOT DETECTED NOT DETECTED Final   Parainfluenza Virus 2 NOT DETECTED NOT DETECTED Final   Parainfluenza Virus 3 NOT DETECTED NOT DETECTED Final   Parainfluenza Virus 4 NOT DETECTED NOT DETECTED Final   Respiratory Syncytial Virus NOT DETECTED NOT DETECTED Final   Bordetella pertussis NOT DETECTED NOT DETECTED Final   Bordetella Parapertussis NOT DETECTED NOT DETECTED Final   Chlamydophila pneumoniae NOT DETECTED NOT DETECTED Final   Mycoplasma pneumoniae NOT DETECTED NOT DETECTED Final    Comment: Performed at [facility], [address] [place], [place] [address]  Blood culture (routine x 2)     Status: None   Collection Time: 09/29/24  8:56 PM   Specimen: BLOOD  Result Value Ref Range Status   Specimen Description BLOOD SITE NOT SPECIFIED  Final   Special Requests   Final    BOTTLES DRAWN AEROBIC ONLY Blood  Culture results may not be optimal due to an inadequate volume of blood received in culture bottles   Culture   Final    NO GROWTH 5 DAYS Performed at [facility] Lab, [address]  [place], [place] [address]    Report Status 10/04/2024 FINAL  Final  Blood culture (routine x 2)     Status: None   Collection Time: 09/29/24  9:00 PM   Specimen: BLOOD RIGHT WRIST  Result Value Ref Range Status   Specimen Description BLOOD RIGHT WRIST  Final   Special Requests   Final    BOTTLES DRAWN AEROBIC ONLY Blood Culture results may not be optimal due to an inadequate volume of blood received in culture bottles   Culture   Final    NO GROWTH 5 DAYS Performed at [facility], [address] [place], [place] [address]    Report Status 10/04/2024 FINAL  Final         Radiology Studies: No results found.         LOS: 9 days   Time spent= 35 mins    Burgess JAYSON Dare, MD [facility]  If 7PM-7AM, please contact night-coverage  10/08/2024, 12:05 PM

## 2024-10-09 ENCOUNTER — Other Ambulatory Visit (HOSPITAL_COMMUNITY): Payer: Self-pay

## 2024-10-09 DIAGNOSIS — R400 Somnolence: Secondary | ICD-10-CM | POA: Diagnosis not present

## 2024-10-09 LAB — BASIC METABOLIC PANEL WITH GFR
Anion gap: 7 (ref 5–15)
BUN: 25 mg/dL — ABNORMAL HIGH (ref 8–23)
CO2: 30 mmol/L (ref 22–32)
Calcium: 8.3 mg/dL — ABNORMAL LOW (ref 8.9–10.3)
Chloride: 94 mmol/L — ABNORMAL LOW (ref 98–111)
Creatinine, Ser: 1.59 mg/dL — ABNORMAL HIGH (ref 0.44–1.00)
GFR, Estimated: 33 mL/min — ABNORMAL LOW (ref >60)
Glucose, Bld: 102 mg/dL — ABNORMAL HIGH (ref 70–99)
Potassium: 3.8 mmol/L (ref 3.5–5.1)
Sodium: 131 mmol/L — ABNORMAL LOW (ref 135–145)

## 2024-10-09 MED ORDER — GABAPENTIN 100 MG PO CAPS
100.0000 mg | ORAL_CAPSULE | Freq: Two times a day (BID) | ORAL | 0 refills | Status: DC
  Filled 2024-10-09: qty 60, 30d supply, fill #0

## 2024-10-09 NOTE — TOC Transition Note (Addendum)
 Transition of Care [facility]) - Discharge Note   Patient Details  Name: [name] MRN: [id number] Date of Birth: 1946/06/07  Transition of [facility]) CM/SW Contact:  Marval Gell, RN Phone Number: 10/09/2024, 9:54 AM   Clinical Narrative:     [name] w patient's daughter. They have chosen [facility]  Daughter states that they do not want hospital bed.  Daughter states they will transport by private car.  Spoke w attending to clarify home oxygen need, does not need it currently, may travel home on RA. Daughter requested it set up at home for future use, will request from hospice when they call back.    Reached out to [facility]- [phone] Spoke w answering service- awaiting call back    10:20 SPoke w daughter at bedside, and received call from Building Services Engineer at [facility]. [name] will set up home delivery of the PRN O2 with [name], daughter. Patient will transport home by private car on RA when bedside nursing completes DC. I have notified nurse by chat that patient is cleared from [facility] for DC.   Final next level of care: Home w Hospice Care Barriers to Discharge: No Barriers Identified   Patient Goals and CMS Choice            Discharge Placement                       Discharge Plan and Services Additional resources added to the After Visit Summary for                              [facility] Agency: [facility] Date [facility] Agency Contacted: 10/09/24 Time HH Agency Contacted: [phone] Representative spoke with at [facility] Agency: [facility] [place]- answering service, awaiting callback  Social Drivers of Health (SDOH) Interventions SDOH Screenings   Food Insecurity: No Food Insecurity (09/30/2024)  Housing: Low Risk  (09/30/2024)  Transportation Needs: No Transportation Needs (09/30/2024)  Utilities: Not At Risk (09/30/2024)  Social Connections: Moderately Integrated (09/30/2024)  Tobacco Use: Low Risk  (09/29/2024)     Readmission Risk  Interventions    09/30/2024    3:22 PM  Readmission Risk Prevention Plan  Transportation Screening Complete  Home Care Screening Complete  Medication Review (RN CM) Complete

## 2024-10-09 NOTE — Progress Notes (Signed)
 CVAD removed per protocol per MD order. Manual pressure applied for 3 mins. Vaseline gauze, gauze, and Tegaderm applied over insertion site. No bleeding or swelling noted. Instructed patient to remain in bed for thirty mins. Educated patient about S/S of infection and when to call MD; no heavy lifting or pressure on R side for 24 hours; keep dressing dry and intact for 24 hours. Pt verbalized comprehension.

## 2024-10-09 NOTE — Discharge Summary (Signed)
 Physician Discharge Summary  [name] FMW:[id number] DOB: 1946-05-08 DOA: 09/29/2024  PCP: Onita Rush, MD  Admit date: 09/29/2024 Discharge date: 10/09/2024  Admitted From: Home Disposition: Home with hospice  Recommendations for Outpatient Follow-up:  Follow up with PCP in 1-2 weeks Please obtain BMP/CBC in one week your next doctors visit.  Per cardiology recommendation discontinue Jardiance , Lasix , isosorbide  and metoprolol  Continue torsemide twice daily  Discharge Condition: Stable CODE STATUS: DNR Diet recommendation: Cardiac  Brief/Interim Summary: Brief Narrative:   78 year old female followed by Dr. Elmira with a history of nonischemic cardiomyopathy, pericardial effusion status post pericardiocentesis (01/2021) , HFrEF, nonobstructive coronary disease and permanent A-fib on Eliquis  along with aortic stenosis and stage IIIb chronic kidney disease. She was recently seen for preoperative clearance for cataract surgery and had reported some dizziness and symptoms suggesting possible overdiuresis. Her diuretics were reduced. She also has a history of hyponatremia. LVEF was 30 to 35%. She now presents over the past 5 days with increasing agitation, lower extremity edema, shortness of breath and visual and auditory hallucinations. She had a similar episode in November 2024. Her PCP diagnosed her with a UTI few days ago and she was started on antibiotics but not improving. In the emergency department she was noted to be in A-fib with heart rate of 90, creatinine was elevated 2.55 above her baseline of 1.5. BNP was elevated at 762 and lactate was 2.2. Chest x-ray showed scarring and possible infiltrate. CT chest showed moderate right and small left pleural effusions with underlying chronic interstitial lung disease. Urinalysis showed nitrite and leukoesterase negative but positive for rare bacteria and she was continued on IV ceftriaxone .   On admission found to have acute CVA on MRI,  seen by neurology who recommended continuing Eliquis  as rest of the workup was unremarkable.  Cardiology has been following the patient for diuresis.  Eventually patient was weaned off of dobutamine and amiodarone  drip.  Plan was to transition him home with hospice services on p.o. diuretics.  Multiple cardiac medications including metoprolol , isosorbide  and Jardiance  were also discontinued.  Medically stable for discharge home with hospice  Assessment & Plan:     Acute on chronic systolic CHF exacerbation, EF 74% TTE shows LVEF 20-25%.  Continue Unna boots.  Weaned off dobutamine drip.  Not a good candidate for advanced therapies.  GDMT/diuresis per cardiology team as mentioned above  Permanent atrial fibrillation - Rates are slightly better.  Now off dobutamine and amiodarone  drip.  She will remain on Eliquis  at home but in the future can consider stopping this.  Also for now stopping metoprolol  and amiodarone  drip per cardiology recommendations.  Acute metabolic encephalopathy Suspect possibly secondary to acute CVA versus recent infection?  Ammonia levels are normal.  Urine culture here shows insignificant growth   Acute ischemic stroke:  Due to encephalopathy of unknown source, patient ended up having CT head which was unremarkable followed by MRI brain which showed Punctate acute ischemic nonhemorrhagic infarct in the high posterior left frontal lobe, precentral gyrus.  A1c 6.0, LDL 49.  Carotid Dopplers, MRI brain and EEG are unremarkable.  Neurology recommending to continue Eliquis     Asymptomatic bacteriuria  Patient afebrile, no leukocytosis, UA not impressive for UTI.  I do not think this is true UTI, discontinued antibiotics.   AKI on CKD stage IIIb, improved  Baseline creatinine 1.5 but presented with 2.5.  Slowly improving and stabilizing with diuresis    Hypoglycemia, improved  No prior history of diabetes, blood sugar  yesterday morning was 58, hemoglobin A1c 6.0.  Likely  due to poor p.o. intake.       Acquired hypothyroidism  Continue Synthroid .  Normal free T4, slightly elevated TSH.  Repeat outpatient in 3 weeks     Body mass index is 30.93 kg/m.   Plans to transition her home with hospice Discontinue further lab work   DVT prophylaxis: apixaban  (ELIQUIS ) tablet 5 mg      Code Status: Limited: Do not attempt resuscitation (DNR) -DNR-LIMITED -Do Not Intubate/DNI  Family Communication: Family at bedside Status is: Inpatient Remains inpatient appropriate because: Charge  PT Follow up Recs:   Subjective: Feels well no complaints.  Examination:  General exam: Appears calm and comfortable  Respiratory system: Clear to auscultation. Respiratory effort normal. Cardiovascular system: Tachycardia, irregularly irregular.  3+ bilateral lower extremity pitting edema Gastrointestinal system: Abdomen is nondistended, soft and nontender. No organomegaly or masses felt. Normal bowel sounds heard. Central nervous system: Alert and oriented. No focal neurological deficits. Extremities: Symmetric 5 x 5 power. Skin: No rashes, lesions or ulcers Psychiatry: Judgement and insight appear normal. Mood & affect appropriate.    Discharge Diagnoses:  Principal Problem:   Somnolence Active Problems:   Low output heart failure (HCC)   Acute on chronic combined systolic and diastolic CHF (congestive heart failure) (HCC)   Cardiogenic shock (HCC)   Lactic acidosis   Goals of care, counseling/discussion      Discharge Exam: Vitals:   10/09/24 0416 10/09/24 0835  BP: (!) 124/111 109/65  Pulse: (!) 114 92  Resp: 17   Temp: (!) 96.6 F (35.9 C) 97.6 F (36.4 C)  SpO2: 99% 99%   Vitals:   10/09/24 0005 10/09/24 0416 10/09/24 0525 10/09/24 0835  BP: 122/65 (!) 124/111  109/65  Pulse: (!) 107 (!) 114  92  Resp: (!) 22 17    Temp: (!) 97.4 F (36.3 C) (!) 96.6 F (35.9 C)  97.6 F (36.4 C)  TempSrc: Oral Axillary  Oral  SpO2: 97% 99%  99%   Weight:   90 kg   Height:          Discharge Instructions   Allergies as of 10/09/2024       Reactions   Spironolactone  Other (See Comments)   Hyponatremia-  the level of sodium in blood is too high   Atorvastatin Other (See Comments)   Aches        Medication List     STOP taking these medications    empagliflozin  10 MG Tabs tablet Commonly known as: Jardiance    furosemide  40 MG tablet Commonly known as: LASIX    isosorbide  mononitrate 30 MG 24 hr tablet Commonly known as: IMDUR    metoprolol  succinate 100 MG 24 hr tablet Commonly known as: TOPROL -XL   sulfamethoxazole-trimethoprim 400-80 MG tablet Commonly known as: BACTRIM       TAKE these medications    acetaminophen  325 MG tablet Commonly known as: TYLENOL  Take 325-650 mg by mouth daily as needed for mild pain or headache.   apixaban  5 MG Tabs tablet Commonly known as: Eliquis  Take 1 tablet (5 mg total) by mouth 2 (two) times daily.   Besivance 0.6 % Susp Generic drug: Besifloxacin HCl Place 1 drop into both eyes 4 (four) times daily.   Bromfenac Sodium 0.07 % Soln Apply 1 drop to eye 2 (two) times daily.   CALTRATE 600+D3 PO Take 1 tablet by mouth every evening.   [employer] Iron 325 (65 Fe) MG tablet Generic  drug: ferrous sulfate  Take 325 mg by mouth daily at 6 PM.   cyanocobalamin  1000 MCG tablet Commonly known as: VITAMIN B12 Take 1,000 mcg by mouth daily.   donepezil 5 MG tablet Commonly known as: ARICEPT Take 5 mg by mouth at bedtime.   Durezol 0.05 % Emul Generic drug: Difluprednate Place 1 drop into the right eye 4 (four) times daily.   gabapentin 100 MG capsule Commonly known as: NEURONTIN Take 1 capsule (100 mg total) by mouth 2 (two) times daily.   lactulose  10 GM/15ML solution Commonly known as: CHRONULAC  Take 15 mLs (10 g total) by mouth every other day. What changed: when to take this   levothyroxine  125 MCG tablet Commonly known as: SYNTHROID  Take 125 mcg by mouth  daily before breakfast.   omeprazole  20 MG capsule Commonly known as: PRILOSEC TAKE 1 CAPSULE BY MOUTH EVERY DAY   prednisoLONE acetate 1 % ophthalmic suspension Commonly known as: PRED FORTE Place 1 drop into both eyes 4 (four) times daily.   rosuvastatin  20 MG tablet Commonly known as: CRESTOR  Take 1 tablet (20 mg total) by mouth daily.   SINEX ULTRA FINE MIST 12-HOUR NA Place 1 spray into both nostrils every 12 (twelve) hours as needed (for congestion).   torsemide 20 MG tablet Commonly known as: DEMADEX Take 1 tablet (20 mg total) by mouth 2 (two) times daily.   valproic acid 250 MG capsule Commonly known as: DEPAKENE Take 250 mg by mouth at bedtime.   Visine 0.025-0.3 % ophthalmic solution Generic drug: naphazoline-pheniramine Place 1 drop into both eyes 2 (two) times daily as needed for eye irritation.        Allergies  Allergen Reactions   Spironolactone  Other (See Comments)    Hyponatremia-  the level of sodium in blood is too high   Atorvastatin Other (See Comments)    Aches     You were cared for by a hospitalist during your hospital stay. If you have any questions about your discharge medications or the care you received while you were in the hospital after you are discharged, you can call the unit and asked to speak with the hospitalist on call if the hospitalist that took care of you is not available. Once you are discharged, your primary care physician will handle any further medical issues. Please note that no refills for any discharge medications will be authorized once you are discharged, as it is imperative that you return to your primary care physician (or establish a relationship with a primary care physician if you do not have one) for your aftercare needs so that they can reassess your need for medications and monitor your lab values.  You were cared for by a hospitalist during your hospital stay. If you have any questions about your discharge  medications or the care you received while you were in the hospital after you are discharged, you can call the unit and asked to speak with the hospitalist on call if the hospitalist that took care of you is not available. Once you are discharged, your primary care physician will handle any further medical issues. Please note that NO REFILLS for any discharge medications will be authorized once you are discharged, as it is imperative that you return to your primary care physician (or establish a relationship with a primary care physician if you do not have one) for your aftercare needs so that they can reassess your need for medications and monitor your lab values.  Please request  your Prim.MD to go over all Hospital Tests and Procedure/Radiological results at the follow up, please get all Hospital records sent to your Prim MD by signing hospital release before you go home.  Get CBC, CMP, 2 view Chest X ray checked  by Primary MD during your next visit or SNF MD in 5-7 days ( we routinely change or add medications that can affect your baseline labs and fluid status, therefore we recommend that you get the mentioned basic workup next visit with your PCP, your PCP may decide not to get them or add new tests based on their clinical decision)  On your next visit with your primary care physician please Get Medicines reviewed and adjusted.  If you experience worsening of your admission symptoms, develop shortness of breath, life threatening emergency, suicidal or homicidal thoughts you must seek medical attention immediately by calling 911 or calling your MD immediately  if symptoms less severe.  You Must read complete instructions/literature along with all the possible adverse reactions/side effects for all the Medicines you take and that have been prescribed to you. Take any new Medicines after you have completely understood and accpet all the possible adverse reactions/side effects.   Do not drive, operate  heavy machinery, perform activities at heights, swimming or participation in water activities or provide baby sitting services if your were admitted for syncope or siezures until you have seen by Primary MD or a Neurologist and advised to do so again.  Do not drive when taking Pain medications.   Procedures/Studies: US  RENAL Result Date: 10/02/2024 CLINICAL DATA:  Acute kidney injury EXAM: RENAL / URINARY TRACT ULTRASOUND COMPLETE COMPARISON:  Renal ultrasound examination dated 06/20/2021 FINDINGS: Right Kidney: Length = 9.9 cm Normal parenchymal echogenicity with preserved corticomedullary differentiation. No urinary tract dilation or shadowing calculi. The ureter is not seen. Left Kidney: Length = 8.3 cm Diffuse cortical thinning. Normal parenchymal echogenicity with preserved corticomedullary differentiation. No urinary tract dilation or shadowing calculi. The ureter is not seen. Bladder: Appears normal for degree of bladder distention. Prevoid bladder volume measures 138 mL. Other: Partially imaged bilateral pleural effusions. IMPRESSION: 1. No urinary tract dilation or shadowing calculi. 2. Diffuse cortical thinning of the left kidney. 3. Partially imaged bilateral pleural effusions. Electronically Signed   By: Limin  Xu M.D.   On: 10/02/2024 15:23   VAS US  CAROTID Result Date: 10/02/2024 Carotid Arterial Duplex Study Patient Name:  [name]  Date of Exam:   10/01/2024 Medical Rec #: [id number]       Accession #:    [id number] Date of Birth: 11/15/1946        Patient Gender: F Patient Age:   38 years Exam Location:  [facility] [place] Procedure:      VAS US  CAROTID Referring Phys: ELIGIO LAV --------------------------------------------------------------------------------  Indications:       CVA and Lethargy and disorientation. Risk Factors:      Hypertension, hyperlipidemia. Other Factors:     HFrEF, dyspnea, severe pulmonary hypertension. Limitations        Today's exam was limited due to the  patient's respiratory                    variation, tachycardia, arrythmia, and vessel tortuosity. Comparison Study:  No prior carotid duplex on file Performing Technologist: Alberta Lis RVS  Examination Guidelines: A complete evaluation includes B-mode imaging, spectral Doppler, color Doppler, and power Doppler as needed of all accessible portions of each vessel. Bilateral testing is considered an  integral part of a complete examination. Limited examinations for reoccurring indications may be performed as noted.  Right Carotid Findings: +----------+--------+--------+--------+------------------+------------------+           PSV cm/sEDV cm/sStenosisPlaque DescriptionComments           +----------+--------+--------+--------+------------------+------------------+ CCA Prox  79      14                                intimal thickening +----------+--------+--------+--------+------------------+------------------+ CCA Distal32      13                                intimal thickening +----------+--------+--------+--------+------------------+------------------+ ICA Prox  80      27              calcific          tortuous           +----------+--------+--------+--------+------------------+------------------+ ICA Mid   77      32                                tortuous           +----------+--------+--------+--------+------------------+------------------+ ICA Distal91      39                                tortuous           +----------+--------+--------+--------+------------------+------------------+ ECA       70      15                                                   +----------+--------+--------+--------+------------------+------------------+ +----------+--------+-------+--------+-------------------+           PSV cm/sEDV cmsDescribeArm Pressure (mmHG) +----------+--------+-------+--------+-------------------+ [id number]                                          +----------+--------+-------+--------+-------------------+ +---------+--------+--+--------+--+ VertebralPSV cm/s50EDV cm/s15 +---------+--------+--+--------+--+  Left Carotid Findings: +----------+--------+--------+--------+------------------+------------------+           PSV cm/sEDV cm/sStenosisPlaque DescriptionComments           +----------+--------+--------+--------+------------------+------------------+ CCA Prox  50      19                                intimal thickening +----------+--------+--------+--------+------------------+------------------+ CCA Distal63      17              heterogenous                         +----------+--------+--------+--------+------------------+------------------+ ICA Prox  102     38              heterogenous                         +----------+--------+--------+--------+------------------+------------------+ ICA Mid   98      47                                                   +----------+--------+--------+--------+------------------+------------------+  ICA Distal75      33                                                   +----------+--------+--------+--------+------------------+------------------+ ECA       48      10                                                   +----------+--------+--------+--------+------------------+------------------+ +----------+--------+--------+--------+-------------------+           PSV cm/sEDV cm/sDescribeArm Pressure (mmHG) +----------+--------+--------+--------+-------------------+ Dlarojcpjw20                                          +----------+--------+--------+--------+-------------------+ +---------+--------+--+--------+--+ VertebralPSV cm/s55EDV cm/s22 +---------+--------+--+--------+--+   Summary: Right Carotid: Velocities in the right ICA are consistent with a 1-39% stenosis. Left Carotid: Velocities in the left ICA are consistent with a 1-39% stenosis.  Vertebrals:  Bilateral vertebral arteries demonstrate antegrade flow. Subclavians: Normal flow hemodynamics were seen in bilateral subclavian              arteries. *See table(s) above for measurements and observations.  Electronically signed by Eather Popp MD on 10/02/2024 at 11:29:06 AM.    Final    DG Chest Port 1 View Result Date: 10/01/2024 CLINICAL DATA:  PICC line EXAM: PORTABLE CHEST 1 VIEW COMPARISON:  09/29/2024 chest x-ray and CT, chest x-ray 10/08/2023 FINDINGS: Cardiomegaly with pleural effusions. Increased vascular congestion and increased heterogeneous bilateral pulmonary infiltrates or edema on underlying chronic disease. Interim right upper extremity central venous catheter with the tip projecting over the superior right atrial region, about 15 mm distal to the expected location of cavoatrial junction. IMPRESSION: 1. Interim right upper extremity central venous catheter with the tip projecting over the superior right atrial region, about 15 mm distal to the expected location of cavoatrial junction. 2. Cardiomegaly with pleural effusions. Increased vascular congestion and increased heterogeneous bilateral pulmonary infiltrates or edema on underlying chronic disease. Electronically Signed   By: Luke Bun M.D.   On: 10/01/2024 17:26   US  EKG SITE RITE Result Date: 10/01/2024 If Site Rite image not attached, placement could not be confirmed due to current cardiac rhythm.  ECHOCARDIOGRAM LIMITED Result Date: 10/01/2024    ECHOCARDIOGRAM LIMITED REPORT   Patient Name:   [name] Date of Exam: 10/01/2024 Medical Rec #:  [id number]      Height:       66.0 in Accession #:    [id number]     Weight:       198.6 lb Date of Birth:  March 23, 1946       BSA:          1.994 m Patient Age:    67 years       BP:           107/69 mmHg Patient Gender: F              HR:           100 bpm. Exam Location:  Inpatient Procedure: Limited Echo, Limited Color Doppler, Color Doppler and Intracardiac  Opacification Agent Indications:    Evaluate LVEF and filling pressures  History:        Patient has prior history of Echocardiogram examinations. CHF;                 Arrythmias:Atrial Fibrillation.  Sonographer:    Charmaine Gaskins Referring Phys: [id number]  THOMAS O'NEAL IMPRESSIONS  1. Patient tachycardic and in an irregular rhythm throughout the exam. Left ventricular ejection fraction, by estimation, is 20 to 25%. The left ventricle has severely decreased function. The left ventricle demonstrates global hypokinesis. The left ventricular internal cavity size was moderately to severely dilated. Diastology indeterminate while in atrial fibrillation.  2. Right ventricular systolic function is mildly reduced. The right ventricular size is normal. There is moderately elevated pulmonary artery systolic pressure.  3. Left atrial size was severely dilated.  4. Right atrial size was moderately dilated.  5. Large pleural effusion.  6. The mitral valve is normal in structure. Mild mitral valve regurgitation. No evidence of mitral stenosis.  7. Tricuspid valve regurgitation is moderate.  8. The aortic valve is calcified. There is mild calcification of the aortic valve. Aortic valve regurgitation is mild. Mild aortic valve stenosis.  9. The inferior vena cava is normal in size with <50% respiratory variability, suggesting right atrial pressure of 8 mmHg. Comparison(s): Slight worsening in LV systolic function, large pleural effusion present. Conclusion(s)/Recommendation(s): Severe LV systolic dysfunction with LVEF = 20-25%. Slightly worse systolic function than prior. Elevated RVSP and pleural effusion suggest volume overload. FINDINGS  Left Ventricle: Patient tachycardic and in an irregular rhythm throughout the exam. Left ventricular ejection fraction, by estimation, is 20 to 25%. The left ventricle has severely decreased function. The left ventricle demonstrates global hypokinesis. Definity contrast agent was given IV  to delineate the left ventricular endocardial borders. The left ventricular internal cavity size was moderately to severely dilated. There is no left ventricular hypertrophy. Abnormal (paradoxical) septal motion, consistent with left bundle branch block. Diastology indeterminate while in atrial fibrillation. Right Ventricle: The right ventricular size is normal. No increase in right ventricular wall thickness. Right ventricular systolic function is mildly reduced. There is moderately elevated pulmonary artery systolic pressure. The tricuspid regurgitant velocity is 3.40 m/s, and with an assumed right atrial pressure of 8 mmHg, the estimated right ventricular systolic pressure is 54.2 mmHg. Left Atrium: Left atrial size was severely dilated. Right Atrium: Right atrial size was moderately dilated. Pericardium: There is no evidence of pericardial effusion. Mitral Valve: The mitral valve is normal in structure. Mild mitral valve regurgitation. No evidence of mitral valve stenosis. Tricuspid Valve: The tricuspid valve is normal in structure. Tricuspid valve regurgitation is moderate . No evidence of tricuspid stenosis. Aortic Valve: The aortic valve is calcified. There is mild calcification of the aortic valve. Aortic valve regurgitation is mild. Mild aortic stenosis is present. Aortic valve mean gradient measures 16.0 mmHg. Aortic valve peak gradient measures 29.2 mmHg. Aortic valve area, by VTI measures 0.82 cm. Pulmonic Valve: The pulmonic valve was not assessed. Aorta: The aortic root and ascending aorta are structurally normal, with no evidence of dilitation. Venous: The inferior vena cava is normal in size with less than 50% respiratory variability, suggesting right atrial pressure of 8 mmHg. Additional Comments: There is a large pleural effusion.  LEFT VENTRICLE PLAX 2D LVIDd:         4.90 cm      Diastology LVIDs:         4.60 cm  LV e' medial:    5.29 cm/s LV PW:         0.90 cm      LV E/e' medial:  25.9  LV IVS:        0.80 cm      LV e' lateral:   19.17 cm/s LVOT diam:     2.00 cm      LV E/e' lateral: 7.1 LV SV:         48 LV SV Index:   24 LVOT Area:     3.14 cm  LV Volumes (MOD) LV vol d, MOD A2C: 153.0 ml LV vol d, MOD A4C: 149.0 ml LV vol s, MOD A2C: 124.0 ml LV vol s, MOD A4C: 115.0 ml LV SV MOD A2C:     29.0 ml LV SV MOD A4C:     149.0 ml LV SV MOD BP:      36.2 ml RIGHT VENTRICLE RV Basal diam:  3.20 cm RV Mid diam:    2.00 cm RVSP:           54.2 mmHg LEFT ATRIUM              Index        RIGHT ATRIUM           Index LA Vol (A2C):   118.0 ml 59.18 ml/m  RA Pressure: 8.00 mmHg LA Vol (A4C):   128.0 ml 64.19 ml/m  RA Area:     24.60 cm LA Biplane Vol: 124.0 ml 62.19 ml/m  RA Volume:   74.00 ml  37.11 ml/m  AORTIC VALVE AV Area (Vmax):    0.84 cm AV Area (Vmean):   0.89 cm AV Area (VTI):     0.82 cm AV Vmax:           270.00 cm/s AV Vmean:          188.000 cm/s AV VTI:            0.584 m AV Peak Grad:      29.2 mmHg AV Mean Grad:      16.0 mmHg LVOT Vmax:         72.20 cm/s LVOT Vmean:        53.400 cm/s LVOT VTI:          0.152 m LVOT/AV VTI ratio: 0.26  AORTA Ao Asc diam: 3.30 cm MITRAL VALVE                TRICUSPID VALVE MV Area (PHT): 6.07 cm     TR Peak grad:   46.2 mmHg MV Decel Time: 125 msec     TR Vmax:        340.00 cm/s MV E velocity: 137.00 cm/s  Estimated RAP:  8.00 mmHg                             RVSP:           54.2 mmHg                              SHUNTS                             Systemic VTI:  0.15 m  Systemic Diam: 2.00 cm Georganna Archer Electronically signed by Georganna Archer Signature Date/Time: 10/01/2024/1:59:01 PM    Final    MR ANGIO HEAD WO CONTRAST Result Date: 09/30/2024 CLINICAL DATA:  Follow-up examination for stroke. EXAM: MRA HEAD WITHOUT CONTRAST TECHNIQUE: Angiographic images of the Circle of Willis were acquired using MRA technique without intravenous contrast. COMPARISON:  Prior brain MRI from 09/29/2024. FINDINGS: Anterior  circulation: Both internal carotid arteries are patent through the siphons without stenosis or other abnormality. A1 segments patent bilaterally. Normal anterior communicating artery complex. Anterior cerebral arteries widely patent. No M1 stenosis or occlusion. No proximal MCA branch occlusion or high-grade stenosis. Distal MCA branches perfused and symmetric. Posterior circulation: Both vertebral arteries are widely patent to the vertebrobasilar junction without stenosis. Right vertebral artery slightly dominant. Neither PICA origin well visualized. Basilar patent without stenosis. Superior cerebral arteries patent bilaterally. Both PCAs primarily supplied via the basilar well perfused to their distal aspects. Anatomic variants: None significant. Other: No intracranial aneurysm. IMPRESSION: Normal intracranial MRA. Electronically Signed   By: Morene Hoard M.D.   On: 09/30/2024 20:18   EEG adult Result Date: 09/30/2024 Shelton Arlin KIDD, MD     09/30/2024  1:43 PM Patient Name: [name] MRN: [id number] Epilepsy Attending: Arlin KIDD Shelton Referring Physician/Provider: Tobie Mario GAILS, MD Date: 09/30/2024 Duration: 26.25 mins Patient history: 78yo F with ams. EEG to evaluate for seizure Level of alertness: Awake, asleep AEDs during EEG study: None Technical aspects: This EEG study was done with scalp electrodes positioned according to the 10-20 International system of electrode placement. Electrical activity was reviewed with band pass filter of 1-70Hz , sensitivity of 7 uV/mm, display speed of 50mm/sec with a 60Hz  notched filter applied as appropriate. EEG data were recorded continuously and digitally stored.  Video monitoring was available and reviewed as appropriate. Description: The posterior dominant rhythm consists of 8Hz  activity of moderate voltage (25-35 uV) seen predominantly in posterior head regions, symmetric and reactive to eye opening and eye closing. Sleep was characterized by vertex  waves, sleep spindles (12 to 14 Hz), maximal frontocentral region. There is intermittent generalized 3 to 6 Hz theta-delta slowing. Hyperventilation and photic stimulation were not performed.   ABNORMALITY - Intermittent slow, generalized  IMPRESSION: This study is suggestive of generalized cerebral dysfunction (encephalopathy). No seizures or epileptiform discharges were seen throughout the recording.  Arlin KIDD Shelton   MR BRAIN WO CONTRAST Result Date: 09/29/2024 EXAM: MRI BRAIN WITHOUT CONTRAST 09/29/2024 11:19:54 PM TECHNIQUE: Multiplanar multisequence MRI of the head/brain was performed without the administration of intravenous contrast. COMPARISON: CT performed earlier the same day. CLINICAL HISTORY: Mental status change, unknown cause. FINDINGS: BRAIN AND VENTRICLES: Mild chronic microvascular ischemic disease noted involving the supratentorial cerebral white matter. Punctate focus of restricted diffusion seen involving the high posterior left frontal lobe, precentral gyrus (series 5, image 91), consistent with a tiny acute ischemic infarct. No associated hemorrhage. No mass. No midline shift. No hydrocephalus. The sella is unremarkable. Normal flow voids. ORBITS: Prior bilateral ocular lens replacement. SINUSES AND MASTOIDS: Small right mastoid effusion noted, of doubtful significance. BONES AND SOFT TISSUES: Hyperostosis frontalis interna noted. Normal marrow signal. No acute soft tissue abnormality. IMPRESSION: 1. Punctate acute ischemic nonhemorrhagic infarct in the high posterior left frontal lobe, precentral gyrus. 2. Underlying mild chronic microvascular ischemic disease. Electronically signed by: Morene Hoard MD 09/29/2024 11:54 PM EDT RP Workstation: [id number]   CT HEAD WO CONTRAST ( ) Result Date: 09/29/2024 CLINICAL DATA:  Shortness of breath altered EXAM:  CT HEAD WITHOUT CONTRAST TECHNIQUE: Contiguous axial images were obtained from the base of the skull through the vertex  without intravenous contrast. RADIATION DOSE REDUCTION: This exam was performed according to the departmental dose-optimization program which includes automated exposure control, adjustment of the mA and/or kV according to patient size and/or use of iterative reconstruction technique. COMPARISON:  MRI 10/08/2023, CT brain 10/08/2023 FINDINGS: Brain: No acute territorial infarction, hemorrhage or intracranial mass. Mild chronic small vessel ischemic changes of the white matter. Stable ventricle size Vascular: No hyperdense vessels.  Carotid vascular calcification Skull: Normal. Negative for fracture or focal lesion. Sinuses/Orbits: No acute finding. Other: None IMPRESSION: 1. No CT evidence for acute intracranial abnormality. 2. Mild chronic small vessel ischemic changes of the white matter. Electronically Signed   By: Luke Bun M.D.   On: 09/29/2024 16:03   CT Chest Wo Contrast Result Date: 09/29/2024 CLINICAL DATA:  Shortness of breath EXAM: CT CHEST WITHOUT CONTRAST TECHNIQUE: Multidetector CT imaging of the chest was performed following the standard protocol without IV contrast. RADIATION DOSE REDUCTION: This exam was performed according to the departmental dose-optimization program which includes automated exposure control, adjustment of the mA and/or kV according to patient size and/or use of iterative reconstruction technique. COMPARISON:  Chest x-ray 09/29/2024, 10/08/2023 FINDINGS: Cardiovascular: Limited evaluation without intravenous contrast. Moderate severe aortic atherosclerosis. Multi vessel coronary vascular calcification. Cardiomegaly. Aortic valvular calcification. No significant pericardial effusion Mediastinum/Nodes: Patent trachea. No thyroid  mass. Few mildly enlarged lymph nodes, AP window nodes up to 12 mm. Right paratracheal nodes measuring up to 11 mm. Subcarinal node measuring up to 18 mm. Esophagus within normal limits. Lungs/Pleura: Moderate right and small moderate left pleural  effusions. Underlying chronic interstitial lung disease with subpleural reticulation and mild bronchiectasis. Smooth interstitial densities within the bilateral lungs with diffuse bilateral ground-glass disease, suspect that there is superimposed edema. Upper Abdomen: Gallstones.  No acute finding Musculoskeletal: Degenerative changes. No acute osseous abnormality. IMPRESSION: 1. Cardiomegaly with moderate right and small to moderate left pleural effusions. 2. Underlying chronic interstitial lung disease with subpleural reticulation and mild bronchiectasis. Smooth interstitial densities within the bilateral lungs with diffuse bilateral ground-glass disease, suspect that there is edema superimposed on chronic interstitial lung disease. 3. Mildly enlarged mediastinal lymph nodes, likely reactive. 4. Gallstones. 5. Aortic atherosclerosis. Aortic Atherosclerosis (ICD10-I70.0). Electronically Signed   By: Luke Bun M.D.   On: 09/29/2024 16:00   DG Chest 1 View Result Date: 09/29/2024 EXAM: 1 VIEW(S) XRAY OF THE CHEST 09/29/2024 01:22:00 PM COMPARISON: 10/08/2023 CLINICAL HISTORY: Cough, shortness of breath. FINDINGS: LUNGS AND PLEURA: Interstitial prominence again noted throughout the lungs similar to prior study, favor scarring. Patchy airspace opacities peripherally in the left mid lung and lung base slightly increased since prior study, favor chronic scarring although acute infiltrate cannot be excluded. No pulmonary edema. No pleural effusion. No pneumothorax. HEART AND MEDIASTINUM: No acute abnormality of the cardiac and mediastinal silhouettes. Aortic atherosclerosis BONES AND SOFT TISSUES: No acute osseous abnormality. IMPRESSION: 1. Interstitial prominence throughout the lungs, similar to prior study, favoring scarring. 2. Patchy peripheral opacities in the left mid to lower lung, slightly increased from prior, favored chronic scarring; superimposed acute infiltrate cannot be excluded. Electronically  signed by: Franky Crease MD 09/29/2024 02:29 PM EDT RP Workstation: [id number]     The results of significant diagnostics from this hospitalization (including imaging, microbiology, ancillary and laboratory) are listed below for reference.     Microbiology: Recent Results (from the past 240 hours)  Urine Culture  Status: Abnormal   Collection Time: 09/29/24  3:16 PM   Specimen: Urine, Clean Catch  Result Value Ref Range Status   Specimen Description URINE, CLEAN CATCH  Final   Special Requests Normal  Final   Culture (A)  Final    <10,000 COLONIES/mL INSIGNIFICANT GROWTH Performed at [facility], [address], [place], [place] [address]    Report Status 10/01/2024 FINAL  Final  Respiratory (~20 pathogens) panel by PCR     Status: None   Collection Time: 09/29/24  4:51 PM   Specimen: Nasopharyngeal Swab; Respiratory  Result Value Ref Range Status   Adenovirus NOT DETECTED NOT DETECTED Final   Coronavirus 229E NOT DETECTED NOT DETECTED Final    Comment: (NOTE) The Coronavirus on the Respiratory Panel, DOES NOT test for the novel  Coronavirus (2019 nCoV)    Coronavirus HKU1 NOT DETECTED NOT DETECTED Final   Coronavirus NL63 NOT DETECTED NOT DETECTED Final   Coronavirus OC43 NOT DETECTED NOT DETECTED Final   Metapneumovirus NOT DETECTED NOT DETECTED Final   Rhinovirus / Enterovirus NOT DETECTED NOT DETECTED Final   Influenza A NOT DETECTED NOT DETECTED Final   Influenza B NOT DETECTED NOT DETECTED Final   Parainfluenza Virus 1 NOT DETECTED NOT DETECTED Final   Parainfluenza Virus 2 NOT DETECTED NOT DETECTED Final   Parainfluenza Virus 3 NOT DETECTED NOT DETECTED Final   Parainfluenza Virus 4 NOT DETECTED NOT DETECTED Final   Respiratory Syncytial Virus NOT DETECTED NOT DETECTED Final   Bordetella pertussis NOT DETECTED NOT DETECTED Final   Bordetella Parapertussis NOT DETECTED NOT DETECTED Final   Chlamydophila pneumoniae NOT DETECTED NOT DETECTED Final    Mycoplasma pneumoniae NOT DETECTED NOT DETECTED Final    Comment: Performed at [facility], [address], [place], [place] [address]  Blood culture (routine x 2)     Status: None   Collection Time: 09/29/24  8:56 PM   Specimen: BLOOD  Result Value Ref Range Status   Specimen Description BLOOD SITE NOT SPECIFIED  Final   Special Requests   Final    BOTTLES DRAWN AEROBIC ONLY Blood Culture results may not be optimal due to an inadequate volume of blood received in culture bottles   Culture   Final    NO GROWTH 5 DAYS Performed at [facility], [address] [place], [place] [address]    Report Status 10/04/2024 FINAL  Final  Blood culture (routine x 2)     Status: None   Collection Time: 09/29/24  9:00 PM   Specimen: BLOOD RIGHT WRIST  Result Value Ref Range Status   Specimen Description BLOOD RIGHT WRIST  Final   Special Requests   Final    BOTTLES DRAWN AEROBIC ONLY Blood Culture results may not be optimal due to an inadequate volume of blood received in culture bottles   Culture   Final    NO GROWTH 5 DAYS Performed at [facility], [address] [place], [place] [address]    Report Status 10/04/2024 FINAL  Final     Labs: BNP (last 3 results) Recent Labs    09/29/24 1303  BNP 762.4*   Basic Metabolic Panel: Recent Labs  Lab 10/03/24 0640 10/04/24 0530 10/05/24 0340 10/06/24 0416 10/07/24 0500 10/08/24 0525 10/09/24 0430  NA 138 138 135 133* 134* 131* 131*  K 3.5 4.1 4.1 3.9 4.1 4.2 3.8  CL 101 101 99 95* 95* 92* 94*  CO2 26 26  27 26 30 28 30   GLUCOSE 115* 105* 103* 135* 140* 93 102*  BUN 32* 33* 30* 29* 25* 25* 25*  CREATININE 1.65* 1.87* 1.74* 1.72* 1.73* 1.74* 1.59*  CALCIUM  8.0* 8.5* 8.4* 8.1* 8.5* 8.3* 8.3*  MG 2.2 2.1 2.0  --   --   --   --   PHOS 2.8 3.0 2.9  --   --   --   --    Liver Function Tests: No results for input(s): AST, ALT, ALKPHOS, BILITOT, PROT, ALBUMIN in the last 168 hours. No results for input(s):  LIPASE, AMYLASE in the last 168 hours. No results for input(s): AMMONIA in the last 168 hours. CBC: Recent Labs  Lab 10/03/24 0640 10/04/24 0530 10/05/24 0340 10/06/24 0416 10/07/24 0500  WBC 7.5 7.8 8.0 8.0 7.4  HGB 11.4* 12.2 12.2 11.9* 12.5  HCT 36.5 40.3 39.3 38.6 39.8  MCV 85.3 86.3 85.4 86.2 84.7  PLT 178 197 195 194 174   Cardiac Enzymes: No results for input(s): CKTOTAL, CKMB, CKMBINDEX, TROPONINI in the last 168 hours. BNP: Invalid input(s): POCBNP CBG: No results for input(s): GLUCAP in the last 168 hours. D-Dimer No results for input(s): DDIMER in the last 72 hours. Hgb A1c No results for input(s): HGBA1C in the last 72 hours. Lipid Profile No results for input(s): CHOL, HDL, LDLCALC, TRIG, CHOLHDL, LDLDIRECT in the last 72 hours. Thyroid  function studies No results for input(s): TSH, T4TOTAL, T3FREE, THYROIDAB in the last 72 hours.  Invalid input(s): FREET3 Anemia work up No results for input(s): VITAMINB12, FOLATE, FERRITIN, TIBC, IRON, RETICCTPCT in the last 72 hours. Urinalysis    Component Value Date/Time   COLORURINE YELLOW 09/29/2024 1324   APPEARANCEUR HAZY (A) 09/29/2024 1324   LABSPEC 1.012 09/29/2024 1324   PHURINE 5.0 09/29/2024 1324   GLUCOSEU 150 (A) 09/29/2024 1324   HGBUR SMALL (A) 09/29/2024 1324   BILIRUBINUR NEGATIVE 09/29/2024 1324   KETONESUR NEGATIVE 09/29/2024 1324   PROTEINUR NEGATIVE 09/29/2024 1324   UROBILINOGEN 0.2 01/31/2011 1208   NITRITE NEGATIVE 09/29/2024 1324   LEUKOCYTESUR LARGE (A) 09/29/2024 1324   Sepsis Labs Recent Labs  Lab 10/04/24 0530 10/05/24 0340 10/06/24 0416 10/07/24 0500  WBC 7.8 8.0 8.0 7.4   Microbiology Recent Results (from the past 240 hours)  Urine Culture     Status: Abnormal   Collection Time: 09/29/24  3:16 PM   Specimen: Urine, Clean Catch  Result Value Ref Range Status   Specimen Description URINE, CLEAN CATCH  Final   Special  Requests Normal  Final   Culture (A)  Final    <10,000 COLONIES/mL INSIGNIFICANT GROWTH Performed at [facility], [address] [place], [place] [address]    Report Status 10/01/2024 FINAL  Final  Respiratory (~20 pathogens) panel by PCR     Status: None   Collection Time: 09/29/24  4:51 PM   Specimen: Nasopharyngeal Swab; Respiratory  Result Value Ref Range Status   Adenovirus NOT DETECTED NOT DETECTED Final   Coronavirus 229E NOT DETECTED NOT DETECTED Final    Comment: (NOTE) The Coronavirus on the Respiratory Panel, DOES NOT test for the novel  Coronavirus (2019 nCoV)    Coronavirus HKU1 NOT DETECTED NOT DETECTED Final   Coronavirus NL63 NOT DETECTED NOT DETECTED Final   Coronavirus OC43 NOT DETECTED NOT DETECTED Final   Metapneumovirus NOT DETECTED NOT DETECTED Final   Rhinovirus / Enterovirus NOT DETECTED NOT DETECTED Final   Influenza A NOT DETECTED NOT DETECTED Final  Influenza B NOT DETECTED NOT DETECTED Final   Parainfluenza Virus 1 NOT DETECTED NOT DETECTED Final   Parainfluenza Virus 2 NOT DETECTED NOT DETECTED Final   Parainfluenza Virus 3 NOT DETECTED NOT DETECTED Final   Parainfluenza Virus 4 NOT DETECTED NOT DETECTED Final   Respiratory Syncytial Virus NOT DETECTED NOT DETECTED Final   Bordetella pertussis NOT DETECTED NOT DETECTED Final   Bordetella Parapertussis NOT DETECTED NOT DETECTED Final   Chlamydophila pneumoniae NOT DETECTED NOT DETECTED Final   Mycoplasma pneumoniae NOT DETECTED NOT DETECTED Final    Comment: Performed at [facility], [address] [place], [place] [address]  Blood culture (routine x 2)     Status: None   Collection Time: 09/29/24  8:56 PM   Specimen: BLOOD  Result Value Ref Range Status   Specimen Description BLOOD SITE NOT SPECIFIED  Final   Special Requests   Final    BOTTLES DRAWN AEROBIC ONLY Blood Culture results may not be optimal due to an inadequate volume of blood received in culture bottles   Culture    Final    NO GROWTH 5 DAYS Performed at [facility], [address] [place], [place] [address]    Report Status 10/04/2024 FINAL  Final  Blood culture (routine x 2)     Status: None   Collection Time: 09/29/24  9:00 PM   Specimen: BLOOD RIGHT WRIST  Result Value Ref Range Status   Specimen Description BLOOD RIGHT WRIST  Final   Special Requests   Final    BOTTLES DRAWN AEROBIC ONLY Blood Culture results may not be optimal due to an inadequate volume of blood received in culture bottles   Culture   Final    NO GROWTH 5 DAYS Performed at [facility], [address] [place], [place] [address]    Report Status 10/04/2024 FINAL  Final     Time coordinating discharge:  I have spent 35 minutes face to face with the patient and on the ward discussing the patients care, assessment, plan and disposition with other care givers. >50% of the time was devoted counseling the patient about the risks and benefits of treatment/Discharge disposition and coordinating care.   SIGNED:   Burgess JAYSON Dare, MD  [facility] 10/09/2024, 10:00 AM   If 7PM-7AM, please contact night-coverage

## 2024-10-09 NOTE — Plan of Care (Signed)
  Problem: Education: Goal: Knowledge of General Education information will improve Description: Including pain rating scale, medication(s)/side effects and non-pharmacologic comfort measures Outcome: Progressing   Problem: Health Behavior/Discharge Planning: Goal: Ability to manage health-related needs will improve Outcome: Progressing   Problem: Clinical Measurements: Goal: Ability to maintain clinical measurements within normal limits will improve Outcome: Progressing   Problem: Clinical Measurements: Goal: Will remain free from infection Outcome: Progressing   Problem: Clinical Measurements: Goal: Cardiovascular complication will be avoided Outcome: Progressing   Problem: Activity: Goal: Risk for activity intolerance will decrease Outcome: Progressing   Problem: Elimination: Goal: Will not experience complications related to bowel motility Outcome: Progressing   Problem: Coping: Goal: Level of anxiety will decrease Outcome: Progressing   Problem: Skin Integrity: Goal: Risk for impaired skin integrity will decrease Outcome: Progressing   Problem: Pain Managment: Goal: General experience of comfort will improve and/or be controlled Outcome: Progressing

## 2024-10-26 ENCOUNTER — Emergency Department (HOSPITAL_COMMUNITY)
Admission: EM | Admit: 2024-10-26 | Discharge: 2024-11-01 | Disposition: E | Attending: Emergency Medicine | Admitting: Emergency Medicine

## 2024-10-26 DIAGNOSIS — Z7901 Long term (current) use of anticoagulants: Secondary | ICD-10-CM | POA: Insufficient documentation

## 2024-10-26 DIAGNOSIS — I472 Ventricular tachycardia, unspecified: Secondary | ICD-10-CM | POA: Diagnosis not present

## 2024-10-26 DIAGNOSIS — N189 Chronic kidney disease, unspecified: Secondary | ICD-10-CM | POA: Diagnosis not present

## 2024-10-26 DIAGNOSIS — I469 Cardiac arrest, cause unspecified: Secondary | ICD-10-CM | POA: Insufficient documentation

## 2024-10-26 LAB — CBC WITH DIFFERENTIAL/PLATELET
Abs Immature Granulocytes: 0.7 K/uL — ABNORMAL HIGH (ref 0.00–0.07)
Basophils Absolute: 0.1 K/uL (ref 0.0–0.1)
Basophils Relative: 1 %
Eosinophils Absolute: 0.1 K/uL (ref 0.0–0.5)
Eosinophils Relative: 1 %
HCT: 46.8 % — ABNORMAL HIGH (ref 36.0–46.0)
Hemoglobin: 13.6 g/dL (ref 12.0–15.0)
Immature Granulocytes: 6 %
Lymphocytes Relative: 19 %
Lymphs Abs: 2.1 K/uL (ref 0.7–4.0)
MCH: 25.7 pg — ABNORMAL LOW (ref 26.0–34.0)
MCHC: 29.1 g/dL — ABNORMAL LOW (ref 30.0–36.0)
MCV: 88.5 fL (ref 80.0–100.0)
Monocytes Absolute: 0.9 K/uL (ref 0.1–1.0)
Monocytes Relative: 8 %
Neutro Abs: 7.6 K/uL (ref 1.7–7.7)
Neutrophils Relative %: 65 %
Platelets: 285 K/uL (ref 150–400)
RBC: 5.29 MIL/uL — ABNORMAL HIGH (ref 3.87–5.11)
RDW: 17.2 % — ABNORMAL HIGH (ref 11.5–15.5)
Smear Review: NORMAL
WBC: 11.5 K/uL — ABNORMAL HIGH (ref 4.0–10.5)
nRBC: 0.4 % — ABNORMAL HIGH (ref 0.0–0.2)

## 2024-10-26 LAB — COMPREHENSIVE METABOLIC PANEL WITH GFR
ALT: 111 U/L — ABNORMAL HIGH (ref 0–44)
AST: 158 U/L — ABNORMAL HIGH (ref 15–41)
Albumin: 2.8 g/dL — ABNORMAL LOW (ref 3.5–5.0)
Alkaline Phosphatase: 67 U/L (ref 38–126)
Anion gap: 18 — ABNORMAL HIGH (ref 5–15)
BUN: 50 mg/dL — ABNORMAL HIGH (ref 8–23)
CO2: 24 mmol/L (ref 22–32)
Calcium: 8.3 mg/dL — ABNORMAL LOW (ref 8.9–10.3)
Chloride: 94 mmol/L — ABNORMAL LOW (ref 98–111)
Creatinine, Ser: 3.22 mg/dL — ABNORMAL HIGH (ref 0.44–1.00)
GFR, Estimated: 14 mL/min — ABNORMAL LOW (ref >60)
Glucose, Bld: 154 mg/dL — ABNORMAL HIGH (ref 70–99)
Potassium: 3.7 mmol/L (ref 3.5–5.1)
Sodium: 136 mmol/L (ref 135–145)
Total Bilirubin: 1 mg/dL (ref 0.0–1.2)
Total Protein: 5.6 g/dL — ABNORMAL LOW (ref 6.5–8.1)

## 2024-10-26 LAB — CBG MONITORING, ED: Glucose-Capillary: 114 mg/dL — ABNORMAL HIGH (ref 70–99)

## 2024-10-26 LAB — I-STAT CHEM 8, ED
BUN: 63 mg/dL — ABNORMAL HIGH (ref 8–23)
Calcium, Ion: 0.83 mmol/L — CL (ref 1.15–1.40)
Chloride: 95 mmol/L — ABNORMAL LOW (ref 98–111)
Creatinine, Ser: 3.5 mg/dL — ABNORMAL HIGH (ref 0.44–1.00)
Glucose, Bld: 158 mg/dL — ABNORMAL HIGH (ref 70–99)
HCT: 44 % (ref 36.0–46.0)
Hemoglobin: 15 g/dL (ref 12.0–15.0)
Potassium: 4.3 mmol/L (ref 3.5–5.1)
Sodium: 132 mmol/L — ABNORMAL LOW (ref 135–145)
TCO2: 23 mmol/L (ref 22–32)

## 2024-10-26 LAB — I-STAT VENOUS BLOOD GAS, ED
Acid-Base Excess: 0 mmol/L (ref 0.0–2.0)
Bicarbonate: 26.6 mmol/L (ref 20.0–28.0)
Calcium, Ion: 0.84 mmol/L — CL (ref 1.15–1.40)
HCT: 43 % (ref 36.0–46.0)
Hemoglobin: 14.6 g/dL (ref 12.0–15.0)
O2 Saturation: 63 %
Potassium: 4.1 mmol/L (ref 3.5–5.1)
Sodium: 131 mmol/L — ABNORMAL LOW (ref 135–145)
TCO2: 28 mmol/L (ref 22–32)
pCO2, Ven: 47.5 mmHg (ref 44–60)
pH, Ven: 7.356 (ref 7.25–7.43)
pO2, Ven: 35 mmHg (ref 32–45)

## 2024-10-26 LAB — I-STAT CG4 LACTIC ACID, ED: Lactic Acid, Venous: 6.1 mmol/L (ref 0.5–1.9)

## 2024-10-26 LAB — PROTIME-INR
INR: 2 — ABNORMAL HIGH (ref 0.8–1.2)
Prothrombin Time: 23.4 s — ABNORMAL HIGH (ref 11.4–15.2)

## 2024-10-26 MED ORDER — SODIUM BICARBONATE 8.4 % IV SOLN
INTRAVENOUS | Status: DC | PRN
  Administered 2024-10-26: 50 meq via INTRAVENOUS

## 2024-10-26 MED ORDER — MORPHINE SULFATE (PF) 2 MG/ML IV SOLN
2.0000 mg | Freq: Once | INTRAVENOUS | Status: AC
  Administered 2024-10-26: 2 mg via INTRAVENOUS
  Filled 2024-10-26: qty 1

## 2024-10-26 MED ORDER — AMIODARONE HCL 150 MG/3ML IV SOLN
INTRAVENOUS | Status: DC | PRN
  Administered 2024-10-26: 300 mg via INTRAVENOUS

## 2024-10-26 MED ORDER — AMIODARONE HCL IN DEXTROSE 360-4.14 MG/200ML-% IV SOLN: 30.0000 mg/h | INTRAVENOUS | Status: DC

## 2024-10-26 MED ORDER — AMIODARONE HCL IN DEXTROSE 360-4.14 MG/200ML-% IV SOLN
60.0000 mg/h | INTRAVENOUS | Status: DC
  Administered 2024-10-26: 60 mg/h via INTRAVENOUS

## 2024-10-26 MED ORDER — CALCIUM CHLORIDE 10 % IV SOLN
1.0000 g | Freq: Once | INTRAVENOUS | Status: AC
  Administered 2024-10-26: 1 g via INTRAVENOUS

## 2024-10-26 MED ORDER — AMIODARONE LOAD VIA INFUSION
150.0000 mg | Freq: Once | INTRAVENOUS | Status: AC
  Administered 2024-10-26: 150 mg via INTRAVENOUS
  Filled 2024-10-26: qty 83.34

## 2024-11-01 NOTE — Code Documentation (Signed)
Family at beside. Family given emotional support. 

## 2024-11-01 NOTE — ED Triage Notes (Addendum)
 Pt arrived by EMS post cardiac arrest. V-tach seen on initial EMS EKG, got 2 epi and 120 J defib en route. CPR for 11 minutes. Oral airway in place. IO in R femur  EMS VS 106/48 88 O2 42 ETCO2

## 2024-11-01 NOTE — ED Notes (Signed)
 First defib at 0045 120 J Second defib at 0048 150 J Cardioversion at 0051 200 J

## 2024-11-01 NOTE — Progress Notes (Signed)
   2024-11-17 0105  Spiritual Encounters  Type of Visit Initial  Care provided to: Family  Referral source Nurse (RN/NT/LPN)  Reason for visit Patient death  OnCall Visit No   Chaplain responded to a call for family support. I provided support for the family of the patient through presence, prayer and compassion in their time of grief.   The family stated that they plan to use [facility] in [place].   Phone # [phone].  Address: [address], [place], [place] [address]  [facility]  [phone]

## 2024-11-01 NOTE — ED Provider Notes (Signed)
 [place] EMERGENCY DEPARTMENT AT [facility] Provider Note   CSN: [id number] Arrival date & time: Nov 03, 2024  0041     Patient presents with: Post Arrest    [name] is a 78 y.o. female.   The history is provided by the EMS personnel, the spouse, a relative and medical records.  [name] is a 78 y.o. female who presents to the Emergency Department complaining of cardiac arrest.  She presents to the emergency department by EMS following witnessed arrest at home.  She got up from the bathroom and ground and collapsed to the ground.  On EMS arrival she was in V. tach, received defibrillator x 1, epi x 2 with about 11 minutes of CPR prior to ED arrival.  She had an oral airway placed, IO in the right tib-fib.  Additional history available from patient's family after patient's initial assessment.  They report that she was just discharged about a week ago following admission for CHF and had experienced a progressive decline since her hospital discharge.  They report that she was in hospice and had stated that she would not want mechanical ventilation but was agreeable to CPR if her heart could be restarted.     Prior to Admission medications   Medication Sig Start Date End Date Taking? Authorizing Provider  acetaminophen  (TYLENOL ) 325 MG tablet Take 325-650 mg by mouth daily as needed for mild pain or headache.    [provider]  apixaban  (ELIQUIS ) 5 MG TABS tablet Take 1 tablet (5 mg total) by mouth 2 (two) times daily. 09/15/24   Patwardhan, Manish J, MD  BESIVANCE 0.6 % SUSP Place 1 drop into both eyes 4 (four) times daily. 09/09/24   [provider]  Bromfenac Sodium 0.07 % SOLN Apply 1 drop to eye 2 (two) times daily. 09/09/24   [provider]  Calcium  Carb-Cholecalciferol  (CALTRATE 600+D3 PO) Take 1 tablet by mouth every evening.    [provider]  [employer] IRON 325 (65 Fe) MG tablet Take 325 mg by mouth daily at 6 PM. 01/16/21    [provider]  donepezil (ARICEPT) 5 MG tablet Take 5 mg by mouth at bedtime. 05/18/24   [provider]  DUREZOL 0.05 % EMUL Place 1 drop into the right eye 4 (four) times daily. 09/09/24   [provider]  gabapentin  (NEURONTIN ) 100 MG capsule Take 1 capsule (100 mg total) by mouth 2 (two) times daily. 10/09/24   Amin, Ankit C, MD  lactulose  (CHRONULAC ) 10 GM/15ML solution Take 15 mLs (10 g total) by mouth every other day. Patient taking differently: Take 10 g by mouth once a week. 10/14/23   Fairy Frames, MD  levothyroxine  (SYNTHROID , LEVOTHROID) 125 MCG tablet Take 125 mcg by mouth daily before breakfast. 06/19/12   [provider]  omeprazole  (PRILOSEC) 20 MG capsule TAKE 1 CAPSULE BY MOUTH EVERY DAY 12/21/21   Perry, John N, MD  Oxymetazoline HCl (SINEX ULTRA FINE MIST 12-HOUR NA) Place 1 spray into both nostrils every 12 (twelve) hours as needed (for congestion).    [provider]  prednisoLONE acetate (PRED FORTE) 1 % ophthalmic suspension Place 1 drop into both eyes 4 (four) times daily. 09/09/24   [provider]  rosuvastatin  (CRESTOR ) 20 MG tablet Take 1 tablet (20 mg total) by mouth daily. 05/11/24   Patwardhan, Newman PARAS, MD  torsemide  (DEMADEX ) 20 MG tablet Take 1 tablet (20 mg total) by mouth 2 (two) times daily. 10/09/24  Amin, Ankit C, MD  valproic acid (DEPAKENE) 250 MG capsule Take 250 mg by mouth at bedtime. 06/09/24   [provider]  VISINE 0.025-0.3 % ophthalmic solution Place 1 drop into both eyes 2 (two) times daily as needed for eye irritation.    [provider]  vitamin B-12 (CYANOCOBALAMIN ) 1000 MCG tablet Take 1,000 mcg by mouth daily. 01/16/21   [provider]    Allergies: Spironolactone  and Atorvastatin    Review of Systems  Unable to perform ROS: Patient unresponsive    Updated Vital Signs BP (!) 148/96   Resp (!) 72   Physical Exam Vitals and nursing note reviewed.   Constitutional:      Appearance: She is well-developed. She is ill-appearing.     Comments: Unresponsive  HENT:     Head: Normocephalic and atraumatic.  Cardiovascular:     Comments: Intermittent spontaneous cardiac activity with a symmetric femoral pulses, left greater than right Pulmonary:     Comments: Good air movement bilaterally with bagged ventilations Abdominal:     Palpations: Abdomen is soft.     Tenderness: There is no abdominal tenderness. There is no guarding or rebound.  Musculoskeletal:        General: No tenderness.     Comments: Trace bilateral lower extremity edema with dressings to bilateral ankles.  IO in the right tibia  Skin:    General: Skin is warm and dry.     Coloration: Skin is pale.  Neurological:     Mental Status: She is oriented to person, place, and time.  Psychiatric:        Behavior: Behavior normal.     (all labs ordered are listed, but only abnormal results are displayed) Labs Reviewed  COMPREHENSIVE METABOLIC PANEL WITH GFR - Abnormal; Notable for the following components:      Result Value   Chloride 94 (*)    Glucose, Bld 154 (*)    BUN 50 (*)    Creatinine, Ser 3.22 (*)    Calcium  8.3 (*)    Total Protein 5.6 (*)    Albumin 2.8 (*)    AST 158 (*)    ALT 111 (*)    GFR, Estimated 14 (*)    Anion gap 18 (*)    All other components within normal limits  CBC WITH DIFFERENTIAL/PLATELET - Abnormal; Notable for the following components:   WBC 11.5 (*)    RBC 5.29 (*)    HCT 46.8 (*)    MCH 25.7 (*)    MCHC 29.1 (*)    RDW 17.2 (*)    nRBC 0.4 (*)    Abs Immature Granulocytes 0.70 (*)    All other components within normal limits  PROTIME-INR - Abnormal; Notable for the following components:   Prothrombin Time 23.4 (*)    INR 2.0 (*)    All other components within normal limits  CBG MONITORING, ED - Abnormal; Notable for the following components:   Glucose-Capillary 114 (*)    All other components within normal limits  I-STAT  VENOUS BLOOD GAS, ED - Abnormal; Notable for the following components:   Sodium 131 (*)    Calcium , Ion 0.84 (*)    All other components within normal limits  I-STAT CHEM 8, ED - Abnormal; Notable for the following components:   Sodium 132 (*)    Chloride 95 (*)    BUN 63 (*)    Creatinine, Ser 3.50 (*)    Glucose, Bld 158 (*)  Calcium , Ion 0.83 (*)    All other components within normal limits  I-STAT CG4 LACTIC ACID, ED - Abnormal; Notable for the following components:   Lactic Acid, Venous 6.1 (*)    All other components within normal limits  I-STAT VENOUS BLOOD GAS, ED  I-STAT CHEM 8, ED  I-STAT CG4 LACTIC ACID, ED    EKG: None  Radiology: No results found.   .Critical Care  Performed by: Griselda Norris, MD Authorized by: Griselda Norris, MD   Critical care provider statement:    Critical care time was exclusive of:  Separately billable procedures and treating other patients   Critical care was necessary to treat or prevent imminent or life-threatening deterioration of the following conditions:  Cardiac failure   Critical care was time spent personally by me on the following activities:  Evaluation of patient's response to treatment, examination of patient, development of treatment plan with patient or surrogate, ordering and performing treatments and interventions, pulse oximetry and re-evaluation of patient's condition CPR  Date/Time: November 17, 2024 1:28 AM  Performed by: Griselda Norris, MD Authorized by: Griselda Norris, MD  CPR Procedure Details:      Amount of time prior to administration of ACLS/BLS (minutes):  10   ACLS/BLS initiated by EMS: Yes     CPR/ACLS performed in the ED: Yes     Outcome: ROSC obtained    CPR performed via ACLS guidelines under my direct supervision.  See RN documentation for details including defibrillator use, medications, doses and timing.    Medications Ordered in the ED  amiodarone  (NEXTERONE ) 1.8 mg/mL load via infusion 150 mg  (150 mg Intravenous Bolus from Bag 11-07-2024 0052)    Followed by  amiodarone  (NEXTERONE  PREMIX) 360-4.14 MG/200ML-% (1.8 mg/mL) IV infusion (0 mg/hr Intravenous Stopped 11-07-2024 0103)    Followed by  amiodarone  (NEXTERONE  PREMIX) 360-4.14 MG/200ML-% (1.8 mg/mL) IV infusion (has no administration in time range)  sodium bicarbonate  injection (50 mEq Intravenous Given 17-Nov-2024 0045)  amiodarone  (CORDARONE ) injection (300 mg Intravenous Given November 17, 2024 0047)  calcium  chloride injection 1 g (1 g Intravenous Given Nov 17, 2024 0054)  morphine  (PF) 2 MG/ML injection 2 mg (2 mg Intravenous Given 11/17/2024 0107)                                    Medical Decision Making Amount and/or Complexity of Data Reviewed Labs: ordered.  Risk Prescription drug management.   Patient with history of nonischemic cardiomyopathy, A-fib on anticoagulation, aortic stenosis and CKD here for out-of-hospital cardiac arrest with return of circulation.  Patient did lose pulses during her ED stay, required CPR as well as defibrillation for pulseless V. tach followed by synchronized cardioversion for V. tach with pulse.  She was treated with epi as well as bicarb and calcium  for possible acidosis and hyperkalemia pending labs.  She was given amio bolus.  She did have return of circulation.  Patient's family was later available for discussion after her initial resuscitation.  Family states that she is currently on hospice care and did not want endotracheal intubation.  They state that she was initially agreeable to CPR if it could bring her back.  Discussed current clinical state and they do not desire additional resuscitation.  Patient was transition to comfort measures, i-gel was discontinued and she quickly expired in the emergency department.  Family was able to visit the patient.     Final diagnoses:  Cardiac arrest (  [facility])  Ventricular tachycardia [facility])    ED Discharge Orders     None          Griselda Norris,  MD 2024-11-17 [phone]

## 2024-11-01 NOTE — Code Documentation (Signed)
 CPR started at 0043 2-minute pulse checks occurred at  0045 no pulse 0047 pulses regained  Restarted CPR at 0100, pulse check at 0102 no pulse. CPR stopped at 0103, comfort care initiated.

## 2024-11-01 DEATH — deceased
